# Patient Record
Sex: Female | Born: 1960 | Race: Black or African American | Hispanic: No | Marital: Single | Smoking: Former smoker
Health system: Southern US, Community
[De-identification: ages and names within clinical notes are randomized; demographics above are authoritative.]

## PROBLEM LIST (undated history)

## (undated) DIAGNOSIS — F209 Schizophrenia, unspecified: Secondary | ICD-10-CM

## (undated) DIAGNOSIS — F70 Mild intellectual disabilities: Secondary | ICD-10-CM

## (undated) DIAGNOSIS — K000 Anodontia: Secondary | ICD-10-CM

## (undated) DIAGNOSIS — Z21 Asymptomatic human immunodeficiency virus [HIV] infection status: Secondary | ICD-10-CM

## (undated) DIAGNOSIS — G8929 Other chronic pain: Secondary | ICD-10-CM

## (undated) DIAGNOSIS — F200 Paranoid schizophrenia: Secondary | ICD-10-CM

## (undated) DIAGNOSIS — I10 Essential (primary) hypertension: Secondary | ICD-10-CM

## (undated) DIAGNOSIS — F609 Personality disorder, unspecified: Secondary | ICD-10-CM

## (undated) DIAGNOSIS — E7800 Pure hypercholesterolemia, unspecified: Secondary | ICD-10-CM

## (undated) DIAGNOSIS — B20 Human immunodeficiency virus [HIV] disease: Secondary | ICD-10-CM

## (undated) DIAGNOSIS — M25551 Pain in right hip: Secondary | ICD-10-CM

## (undated) DIAGNOSIS — R140 Abdominal distension (gaseous): Secondary | ICD-10-CM

## (undated) DIAGNOSIS — N6092 Unspecified benign mammary dysplasia of left breast: Secondary | ICD-10-CM

## (undated) DIAGNOSIS — K08109 Complete loss of teeth, unspecified cause, unspecified class: Secondary | ICD-10-CM

## (undated) DIAGNOSIS — R2689 Other abnormalities of gait and mobility: Secondary | ICD-10-CM

## (undated) DIAGNOSIS — Z8489 Family history of other specified conditions: Secondary | ICD-10-CM

## (undated) HISTORY — DX: Human immunodeficiency virus (HIV) disease: B20

## (undated) HISTORY — DX: Schizophrenia, unspecified: F20.9

## (undated) HISTORY — PX: UMBILICAL HERNIA REPAIR: SHX196

## (undated) HISTORY — DX: Mild intellectual disabilities: F70

## (undated) HISTORY — PX: HEMORRHOID SURGERY: SHX153

## (undated) HISTORY — DX: Asymptomatic human immunodeficiency virus (hiv) infection status: Z21

---

## 2011-10-19 DIAGNOSIS — D696 Thrombocytopenia, unspecified: Secondary | ICD-10-CM | POA: Insufficient documentation

## 2011-10-19 DIAGNOSIS — M87059 Idiopathic aseptic necrosis of unspecified femur: Secondary | ICD-10-CM | POA: Insufficient documentation

## 2011-10-19 DIAGNOSIS — F70 Mild intellectual disabilities: Secondary | ICD-10-CM | POA: Insufficient documentation

## 2011-10-19 DIAGNOSIS — F200 Paranoid schizophrenia: Secondary | ICD-10-CM | POA: Insufficient documentation

## 2013-11-04 DIAGNOSIS — N898 Other specified noninflammatory disorders of vagina: Secondary | ICD-10-CM | POA: Insufficient documentation

## 2015-03-06 ENCOUNTER — Other Ambulatory Visit (HOSPITAL_COMMUNITY): Payer: Self-pay | Admitting: Internal Medicine

## 2015-03-06 DIAGNOSIS — Z1231 Encounter for screening mammogram for malignant neoplasm of breast: Secondary | ICD-10-CM

## 2015-03-16 ENCOUNTER — Telehealth: Payer: Self-pay

## 2015-03-16 NOTE — Telephone Encounter (Signed)
[facility] [place], CALLED TO SCHEDULE PATIENT FOR COLONOSCOPY  [phone]

## 2015-03-23 NOTE — Telephone Encounter (Signed)
I called and spoke with Karina Clarke Caregiver.  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 She does have a legal guardian, her sister, Karina Clarke @ [phone].  I told him that I will call her sister and see when she can come with pt to the office visit.  He faxed over a copy of her insurance cards and a copy of the guardianship.  I have called and LMOM for a return call from [place].

## 2015-03-23 NOTE — Telephone Encounter (Signed)
T/C from pt's sister, Harmon Pier. She said she will get with Jeronimo Greaves, the caregiver and they will coordinate their schedules and he will call to schedule the appt.

## 2015-03-29 NOTE — Telephone Encounter (Signed)
Sending a letter to Dr. Legrand Rams also.

## 2015-03-29 NOTE — Telephone Encounter (Signed)
Mailing a letter to caregiver to call when ready to schedule.

## 2015-03-30 ENCOUNTER — Telehealth: Payer: Self-pay

## 2015-03-30 NOTE — Telephone Encounter (Signed)
Patient caretaker called to schedule colonoscopy   Please call eva at [phone]  Good time to call her is around 3pm

## 2015-04-05 ENCOUNTER — Ambulatory Visit (HOSPITAL_COMMUNITY)
Admission: RE | Admit: 2015-04-05 | Discharge: 2015-04-05 | Disposition: A | Discharge status: Home / Self Care | Payer: Medicaid Other | Source: Ambulatory Visit | Attending: Internal Medicine | Admitting: Internal Medicine

## 2015-04-05 ENCOUNTER — Ambulatory Visit (HOSPITAL_COMMUNITY): Payer: Medicaid Other

## 2015-04-05 DIAGNOSIS — Z1231 Encounter for screening mammogram for malignant neoplasm of breast: Secondary | ICD-10-CM | POA: Diagnosis not present

## 2015-04-06 ENCOUNTER — Other Ambulatory Visit (HOSPITAL_COMMUNITY): Payer: Self-pay | Admitting: Internal Medicine

## 2015-04-06 DIAGNOSIS — R921 Mammographic calcification found on diagnostic imaging of breast: Secondary | ICD-10-CM

## 2015-04-06 NOTE — Telephone Encounter (Signed)
Pt's sister, Harmon Pier called and said to schedule the appt with the facility and she will try to come to the appt. I told her she needs to be here since she is the POA. She said she would be willing to sign papers if she does not come. I told her again, she should come with them to the appt. Mortimer Fries from the facility called and scheduled the appt for 05/12/2015 at 10:30 Am with Laban Emperor, NP.  She said she will let EVA know and will call back to reschedule if not convenient with Harmon Pier. Harmon Pier would like to know if she can sign papers without going to the hospital with the patient.  Routing to Neil Crouch, PA to advise!

## 2015-04-06 NOTE — Telephone Encounter (Signed)
LMOM for Harmon Pier to call to schedule the ov appt for pt.

## 2015-04-07 NOTE — Telephone Encounter (Signed)
Let's ask Threasa Beards in Endo if they are able to do telephone consents.

## 2015-04-10 NOTE — Telephone Encounter (Signed)
Endo is faxing a consent here and we can get it signed and fax it back to them.

## 2015-04-13 NOTE — Telephone Encounter (Signed)
I spoke to Medon, she said if POA comes to the office with pt to appt, we can do the consent here. If she doesn't come, they can do the consent over the phone prior to appt for the colonoscopy. She will fax the paperwork that we need if the POA does come with pt.

## 2015-04-14 NOTE — Telephone Encounter (Signed)
FYI to Leslie Lewis, PA.  

## 2015-04-14 NOTE — Telephone Encounter (Signed)
noted 

## 2015-05-02 ENCOUNTER — Ambulatory Visit (HOSPITAL_COMMUNITY)
Admission: RE | Admit: 2015-05-02 | Discharge: 2015-05-02 | Disposition: A | Discharge status: Home / Self Care | Payer: Medicaid Other | Source: Ambulatory Visit | Attending: Internal Medicine | Admitting: Internal Medicine

## 2015-05-02 ENCOUNTER — Other Ambulatory Visit: Payer: Self-pay | Admitting: Internal Medicine

## 2015-05-02 DIAGNOSIS — R928 Other abnormal and inconclusive findings on diagnostic imaging of breast: Secondary | ICD-10-CM | POA: Diagnosis not present

## 2015-05-02 DIAGNOSIS — R921 Mammographic calcification found on diagnostic imaging of breast: Secondary | ICD-10-CM | POA: Diagnosis not present

## 2015-05-09 ENCOUNTER — Ambulatory Visit
Admission: RE | Admit: 2015-05-09 | Discharge: 2015-05-09 | Disposition: A | Discharge status: Home / Self Care | Payer: Medicaid Other | Source: Ambulatory Visit | Attending: Internal Medicine | Admitting: Internal Medicine

## 2015-05-09 DIAGNOSIS — R921 Mammographic calcification found on diagnostic imaging of breast: Secondary | ICD-10-CM

## 2015-05-12 ENCOUNTER — Ambulatory Visit: Payer: Medicaid Other | Admitting: Gastroenterology

## 2015-05-22 ENCOUNTER — Other Ambulatory Visit: Payer: Self-pay | Admitting: General Surgery

## 2015-05-22 DIAGNOSIS — N6092 Unspecified benign mammary dysplasia of left breast: Secondary | ICD-10-CM

## 2015-05-24 ENCOUNTER — Ambulatory Visit (INDEPENDENT_AMBULATORY_CARE_PROVIDER_SITE_OTHER): Payer: Medicaid Other | Admitting: Nurse Practitioner

## 2015-05-24 ENCOUNTER — Encounter: Payer: Self-pay | Admitting: Nurse Practitioner

## 2015-05-24 ENCOUNTER — Other Ambulatory Visit: Payer: Self-pay

## 2015-05-24 VITALS — BP 103/67 | HR 119 | Temp 98.2°F | Ht 73.0 in | Wt 215.4 lb

## 2015-05-24 DIAGNOSIS — Z1211 Encounter for screening for malignant neoplasm of colon: Secondary | ICD-10-CM

## 2015-05-24 MED ORDER — PEG-KCL-NACL-NASULF-NA ASC-C 100 G PO SOLR: 1.0000 | Freq: Once | ORAL | Status: DC

## 2015-05-24 MED ORDER — PHOSPHATE LAXATIVE 2.7-7.2 GM/15ML PO SOLN: 15.0000 mL | Freq: Once | ORAL | Status: DC

## 2015-05-24 NOTE — Progress Notes (Signed)
Cc'd to pcp 

## 2015-05-24 NOTE — Assessment & Plan Note (Signed)
Asian presents for sever screening colonoscopy. Patient has mild intellectual slowing and lives at a facility. Also has schizophrenia and HIV positive. Her sister is POA and has been arranged for her sister Rae Lips documents over telephone with [facility] and endoscopy. Patient is currently a symptomatically GI standpoint. She is slightly tachycardic but this appears to be baseline per records. Also states she is nervous today. We will proceed with colonoscopy in the OR with propofol.  Proceed with colonoscopy with Dr. Oneida Alar in the OR with propofol/MAC in the near future. The risks, benefits, and alternatives have been discussed in detail with the patient. They state understanding and desire to proceed.   The patient is not on any anticoagulants. The patient is on circumflex well, congestion, and Haldol. No chronic pain medicines or anxiolytics. We'll proceed with the procedure and the OR propofol due to polypharmacy and, and medical/mental history.

## 2015-05-24 NOTE — Progress Notes (Signed)
Primary Care Physician:  Rosita Fire, MD Primary Gastroenterologist:  Dr. Oneida Alar  Chief Complaint  Patient presents with  . Colonoscopy    HPI:   54 year old female referred by PCP for screening colonoscopy. PCP notes reviewed. Patient has a history of schizophrenia, HIV positive, and mild intellectual problems. The patient's sister [name] is her POA. The patient is accompanied by a home caregiver. Per Threasa Beards and Endo (per telephone note in the system) her sister can sign her consent over the phone with the endoscopy Center. No GI procedure notes found in the HR system.  Today she states she is not having any abdominal pain. Denies N/V. Has regular bowel movements. Denies hematochezia and melena. Has occasional morning-time nausea but no vomiting. Denies GERD/dydpepsia symptoms. Denies chest pain and dyspnea. Denies chest pain, dyspnea, dizziness, lightheadedness, syncope, near syncope. Denies any other upper or lower GI symptoms.  Past Medical History  Diagnosis Date  . HIV (human immunodeficiency virus infection)   . Schizophrenia   . Mild intellectual disability     No past surgical history on file.  Current Outpatient Prescriptions  Medication Sig Dispense Refill  . atazanavir (REYATAZ) 200 MG capsule Take 400 mg by mouth daily with breakfast.    . benztropine (COGENTIN) 1 MG tablet Take 1 mg by mouth 2 (two) times daily.    . divalproex (DEPAKOTE ER) 500 MG 24 hr tablet Take 500 mg by mouth daily.    . haloperidol (HALDOL) 5 MG tablet Take 5 mg by mouth 2 (two) times daily.    . QUEtiapine (SEROQUEL) 200 MG tablet Take 200 mg by mouth at bedtime.    . raltegravir (ISENTRESS) 400 MG tablet Take 400 mg by mouth 2 (two) times daily.    . valACYclovir (VALTREX) 1000 MG tablet Take 1,000 mg by mouth 2 (two) times daily.     No current facility-administered medications for this visit.    Allergies as of 05/24/2015  . (Not on File)    No family history on  file.  History   Social History  . Marital Status: Single    Spouse Name: N/A  . Number of Children: N/A  . Years of Education: N/A   Occupational History  . Not on file.   Social History Main Topics  . Smoking status: Current Every Day Smoker -- 1.00 packs/day    Types: Cigarettes  . Smokeless tobacco: Not on file  . Alcohol Use: No  . Drug Use: No  . Sexual Activity: Not on file   Other Topics Concern  . Not on file   Social History Narrative  . No narrative on file    Review of Systems: 10 piint ROS negative except as per HPI.    Physical Exam: BP 103/67 mmHg  Pulse 119  Temp(Src) 98.2 F (36.8 C) (Oral)  Ht 6\' 1"  (1.854 m)  Wt 215 lb 6.4 oz (97.705 kg)  BMI 28.42 kg/m2 General:   Alert and oriented. Pleasant and cooperative. Well-nourished and well-developed.  Head:  Normocephalic and atraumatic. Eyes:  Without icterus, sclera clear and conjunctiva pink.  Ears:  Normal auditory acuity. Cardiovascular:  S1, S2 present with systolic murmurs appreciated. Tachycardic rate. Extremities without clubbing or edema. Respiratory:  Clear to auscultation bilaterally. No wheezes, rales, or rhonchi. No distress.  Gastrointestinal:  +BS, soft, non-tender and non-distended. No HSM noted. No guarding or rebound. No masses appreciated.  Rectal:  Deferred  Skin:  Intact without significant lesions or rashes. Neurologic:  Alert  and oriented x4;  grossly normal neurologically. Intellectually slow but seems to comprehend and answer questions appropriately. Psych:  Alert and cooperative. Normal mood and affect. Heme/Lymph/Immune: No excessive bruising noted.    05/24/2015 10:12 AM

## 2015-05-24 NOTE — Patient Instructions (Signed)
1. We will schedule your procedure for you. 2. Further recommendations to be based on results of your procedure.

## 2015-05-30 ENCOUNTER — Telehealth: Payer: Self-pay

## 2015-05-30 NOTE — Telephone Encounter (Signed)
Spoke with patients sister Harmon Pier) today and she is aware of patients pre-op appt on 06/08/2015 @ 11:00am.   Harmon Pier states she will be available via phone to give verbal consent that day. Her phone number is [phone].  She knows to be available on that date and time.

## 2015-05-31 ENCOUNTER — Other Ambulatory Visit: Payer: Self-pay | Admitting: General Surgery

## 2015-05-31 DIAGNOSIS — N6092 Unspecified benign mammary dysplasia of left breast: Secondary | ICD-10-CM

## 2015-06-07 NOTE — Patient Instructions (Signed)
Karina Clarke  06/07/2015     @PREFPERIOPPHARMACY @   Your procedure is scheduled on  06/13/2015   Report to [facility] at  800  A.M.  Call this number if you have problems the morning of surgery:  [phone]   Remember:  Do not eat food or drink liquids after midnight.  Take these medicines the morning of surgery with A SIP OF WATER  Epricom, symmetrel, reyatay, depakote, haldol, isentress.   Do not wear jewelry, make-up or nail polish.  Do not wear lotions, powders, or perfumes.    Do not shave 48 hours prior to surgery.  Men may shave face and neck.  Do not bring valuables to the hospital.  [facility] is not responsible for any belongings or valuables.  Contacts, dentures or bridgework may not be worn into surgery.  Leave your suitcase in the car.  After surgery it may be brought to your room.  For patients admitted to the hospital, discharge time will be determined by your treatment team.  Patients discharged the day of surgery will not be allowed to drive home.   Name and phone number of your driver:   family Special instructions:  Follow your diet and prep instructions given ot you by Dr Nona Dell office.  Please read over the following fact sheets that you were given. Pain Booklet, Coughing and Deep Breathing, Surgical Site Infection Prevention, Anesthesia Post-op Instructions and Care and Recovery After Surgery      Colonoscopy A colonoscopy is an exam to look at the entire large intestine (colon). This exam can help find problems such as tumors, polyps, inflammation, and areas of bleeding. The exam takes about 1 hour.  LET [facility] CARE PROVIDER KNOW ABOUT:   Any allergies you have.  All medicines you are taking, including vitamins, herbs, eye drops, creams, and over-the-counter medicines.  Previous problems you or members of your family have had with the use of anesthetics.  Any blood disorders you have.  Previous surgeries you have  had.  Medical conditions you have. RISKS AND COMPLICATIONS  Generally, this is a safe procedure. However, as with any procedure, complications can occur. Possible complications include:  Bleeding.  Tearing or rupture of the colon wall.  Reaction to medicines given during the exam.  Infection (rare). BEFORE THE PROCEDURE   Ask your health care provider about changing or stopping your regular medicines.  You may be prescribed an oral bowel prep. This involves drinking a large amount of medicated liquid, starting the day before your procedure. The liquid will cause you to have multiple loose stools until your stool is almost clear or light green. This cleans out your colon in preparation for the procedure.  Do not eat or drink anything else once you have started the bowel prep, unless your health care provider tells you it is safe to do so.  Arrange for someone to drive you home after the procedure. PROCEDURE   You will be given medicine to help you relax (sedative).  You will lie on your side with your knees bent.  A long, flexible tube with a light and camera on the end (colonoscope) will be inserted through the rectum and into the colon. The camera sends video back to a computer screen as it moves through the colon. The colonoscope also releases carbon dioxide gas to inflate the colon. This helps your health care provider see the area better.  During the exam, your health care provider  may take a small tissue sample (biopsy) to be examined under a microscope if any abnormalities are found.  The exam is finished when the entire colon has been viewed. AFTER THE PROCEDURE   Do not drive for 24 hours after the exam.  You may have a small amount of blood in your stool.  You may pass moderate amounts of gas and have mild abdominal cramping or bloating. This is caused by the gas used to inflate your colon during the exam.  Ask when your test results will be ready and how you will  get your results. Make sure you get your test results. Document Released: 11/08/2000 Document Revised: 09/01/2013 Document Reviewed: 07/19/2013 [facility] Patient Information 2015 [place], [place]. This information is not intended to replace advice given to you by your health care provider. Make sure you discuss any questions you have with your health care provider. PATIENT INSTRUCTIONS POST-ANESTHESIA  IMMEDIATELY FOLLOWING SURGERY:  Do not drive or operate machinery for the first twenty four hours after surgery.  Do not make any important decisions for twenty four hours after surgery or while taking narcotic pain medications or sedatives.  If you develop intractable nausea and vomiting or a severe headache please notify your doctor immediately.  FOLLOW-UP:  Please make an appointment with your surgeon as instructed. You do not need to follow up with anesthesia unless specifically instructed to do so.  WOUND CARE INSTRUCTIONS (if applicable):  Keep a dry clean dressing on the anesthesia/puncture wound site if there is drainage.  Once the wound has quit draining you may leave it open to air.  Generally you should leave the bandage intact for twenty four hours unless there is drainage.  If the epidural site drains for more than 36-48 hours please call the anesthesia department.  QUESTIONS?:  Please feel free to call your physician or the hospital operator if you have any questions, and they will be happy to assist you.

## 2015-06-08 ENCOUNTER — Encounter (HOSPITAL_COMMUNITY)
Admission: RE | Admit: 2015-06-08 | Discharge: 2015-06-08 | Disposition: A | Discharge status: Home / Self Care | Payer: Medicaid Other | Source: Ambulatory Visit | Attending: Gastroenterology | Admitting: Gastroenterology

## 2015-06-08 ENCOUNTER — Encounter (HOSPITAL_COMMUNITY): Payer: Self-pay

## 2015-06-08 DIAGNOSIS — Z01818 Encounter for other preprocedural examination: Secondary | ICD-10-CM | POA: Insufficient documentation

## 2015-06-08 LAB — BASIC METABOLIC PANEL
Anion gap: 10 (ref 5–15)
BUN: 17 mg/dL (ref 6–20)
CHLORIDE: 99 mmol/L — AB (ref 101–111)
CO2: 27 mmol/L (ref 22–32)
Calcium: 9.8 mg/dL (ref 8.9–10.3)
Creatinine, Ser: 1.09 mg/dL — ABNORMAL HIGH (ref 0.44–1.00)
GFR calc Af Amer: >60 mL/min (ref >60)
GFR, EST NON AFRICAN AMERICAN: 58 mL/min — AB (ref >60)
GLUCOSE: 75 mg/dL (ref 65–99)
POTASSIUM: 4.9 mmol/L (ref 3.5–5.1)
Sodium: 136 mmol/L (ref 135–145)

## 2015-06-08 LAB — CBC WITH DIFFERENTIAL/PLATELET
BASOS ABS: 0 10*3/uL (ref 0.0–0.1)
Basophils Relative: 0 % (ref 0–1)
EOS PCT: 1 % (ref 0–5)
Eosinophils Absolute: 0.1 10*3/uL (ref 0.0–0.7)
HCT: 41.4 % (ref 36.0–46.0)
HEMOGLOBIN: 13.8 g/dL (ref 12.0–15.0)
LYMPHS ABS: 3.4 10*3/uL (ref 0.7–4.0)
Lymphocytes Relative: 59 % — ABNORMAL HIGH (ref 12–46)
MCH: 32.2 pg (ref 26.0–34.0)
MCHC: 33.3 g/dL (ref 30.0–36.0)
MCV: 96.7 fL (ref 78.0–100.0)
Monocytes Absolute: 0.4 10*3/uL (ref 0.1–1.0)
Monocytes Relative: 7 % (ref 3–12)
Neutro Abs: 2 10*3/uL (ref 1.7–7.7)
Neutrophils Relative %: 34 % — ABNORMAL LOW (ref 43–77)
Platelets: 202 10*3/uL (ref 150–400)
RBC: 4.28 MIL/uL (ref 3.87–5.11)
RDW: 16 % — AB (ref 11.5–15.5)
WBC: 5.9 10*3/uL (ref 4.0–10.5)

## 2015-06-08 NOTE — Pre-Procedure Instructions (Signed)
Patient resides at [place].  Will be brought by staff member.  Telephone number is [phone].  Guardian is Lenor Coffin (sister), from whom telephone consent was obtained, per order of the court due to mental disability.  Telephone number is [phone].

## 2015-06-13 ENCOUNTER — Ambulatory Visit (HOSPITAL_COMMUNITY): Payer: Medicaid Other | Admitting: Anesthesiology

## 2015-06-13 ENCOUNTER — Encounter (HOSPITAL_COMMUNITY): Payer: Self-pay

## 2015-06-13 ENCOUNTER — Ambulatory Visit (HOSPITAL_COMMUNITY)
Admission: RE | Admit: 2015-06-13 | Discharge: 2015-06-13 | Disposition: A | Discharge status: Home / Self Care | Payer: Medicaid Other | Source: Ambulatory Visit | Attending: Gastroenterology | Admitting: Gastroenterology

## 2015-06-13 ENCOUNTER — Encounter (HOSPITAL_COMMUNITY)
Admission: RE | Disposition: A | Discharge status: Home / Self Care | Payer: Self-pay | Source: Ambulatory Visit | Attending: Gastroenterology

## 2015-06-13 DIAGNOSIS — Z79899 Other long term (current) drug therapy: Secondary | ICD-10-CM | POA: Insufficient documentation

## 2015-06-13 DIAGNOSIS — F17210 Nicotine dependence, cigarettes, uncomplicated: Secondary | ICD-10-CM | POA: Insufficient documentation

## 2015-06-13 DIAGNOSIS — F70 Mild intellectual disabilities: Secondary | ICD-10-CM | POA: Insufficient documentation

## 2015-06-13 DIAGNOSIS — F209 Schizophrenia, unspecified: Secondary | ICD-10-CM | POA: Diagnosis not present

## 2015-06-13 DIAGNOSIS — Q438 Other specified congenital malformations of intestine: Secondary | ICD-10-CM | POA: Insufficient documentation

## 2015-06-13 DIAGNOSIS — Z1211 Encounter for screening for malignant neoplasm of colon: Secondary | ICD-10-CM | POA: Diagnosis not present

## 2015-06-13 DIAGNOSIS — Z21 Asymptomatic human immunodeficiency virus [HIV] infection status: Secondary | ICD-10-CM | POA: Insufficient documentation

## 2015-06-13 DIAGNOSIS — K648 Other hemorrhoids: Secondary | ICD-10-CM | POA: Diagnosis not present

## 2015-06-13 DIAGNOSIS — K644 Residual hemorrhoidal skin tags: Secondary | ICD-10-CM | POA: Insufficient documentation

## 2015-06-13 HISTORY — PX: COLONOSCOPY WITH PROPOFOL: SHX5780

## 2015-06-13 SURGERY — COLONOSCOPY WITH PROPOFOL
Anesthesia: Monitor Anesthesia Care

## 2015-06-13 MED ORDER — FENTANYL CITRATE (PF) 100 MCG/2ML IJ SOLN
INTRAMUSCULAR | Status: DC | PRN
  Administered 2015-06-13: 25 ug via INTRAVENOUS

## 2015-06-13 MED ORDER — PROPOFOL INFUSION 10 MG/ML OPTIME
INTRAVENOUS | Status: DC | PRN
  Administered 2015-06-13: 125 ug/kg/min via INTRAVENOUS

## 2015-06-13 MED ORDER — GLYCOPYRROLATE 0.2 MG/ML IJ SOLN
0.2000 mg | Freq: Once | INTRAMUSCULAR | Status: AC
  Administered 2015-06-13: 0.2 mg via INTRAVENOUS

## 2015-06-13 MED ORDER — WATER FOR IRRIGATION, STERILE IR SOLN
Status: DC | PRN
  Administered 2015-06-13: 1000 mL

## 2015-06-13 MED ORDER — ONDANSETRON HCL 4 MG/2ML IJ SOLN
4.0000 mg | Freq: Once | INTRAMUSCULAR | Status: AC
  Administered 2015-06-13: 4 mg via INTRAVENOUS

## 2015-06-13 MED ORDER — ONDANSETRON HCL 4 MG/2ML IJ SOLN
INTRAMUSCULAR | Status: AC
  Filled 2015-06-13: qty 2

## 2015-06-13 MED ORDER — ONDANSETRON HCL 4 MG/2ML IJ SOLN: 4.0000 mg | Freq: Once | INTRAMUSCULAR | Status: DC | PRN

## 2015-06-13 MED ORDER — LACTATED RINGERS IV SOLN
INTRAVENOUS | Status: DC
  Administered 2015-06-13: 09:00:00 via INTRAVENOUS

## 2015-06-13 MED ORDER — FENTANYL CITRATE (PF) 100 MCG/2ML IJ SOLN
25.0000 ug | INTRAMUSCULAR | Status: AC
  Administered 2015-06-13: 25 ug via INTRAVENOUS

## 2015-06-13 MED ORDER — MIDAZOLAM HCL 2 MG/2ML IJ SOLN
1.0000 mg | INTRAMUSCULAR | Status: DC | PRN
  Administered 2015-06-13: 2 mg via INTRAVENOUS

## 2015-06-13 MED ORDER — MIDAZOLAM HCL 2 MG/2ML IJ SOLN
INTRAMUSCULAR | Status: AC
  Filled 2015-06-13: qty 2

## 2015-06-13 MED ORDER — FENTANYL CITRATE (PF) 100 MCG/2ML IJ SOLN
INTRAMUSCULAR | Status: AC
  Filled 2015-06-13: qty 2

## 2015-06-13 MED ORDER — FENTANYL CITRATE (PF) 100 MCG/2ML IJ SOLN: 25.0000 ug | INTRAMUSCULAR | Status: DC | PRN

## 2015-06-13 MED ORDER — PROPOFOL 10 MG/ML IV BOLUS
INTRAVENOUS | Status: DC | PRN
Start: 2015-06-13 — End: 2015-06-13
  Administered 2015-06-13: 20 mg via INTRAVENOUS
  Administered 2015-06-13: 10 mg via INTRAVENOUS

## 2015-06-13 MED ORDER — GLYCOPYRROLATE 0.2 MG/ML IJ SOLN
INTRAMUSCULAR | Status: AC
  Filled 2015-06-13: qty 1

## 2015-06-13 MED ORDER — PROPOFOL 10 MG/ML IV BOLUS
INTRAVENOUS | Status: AC
  Filled 2015-06-13: qty 20

## 2015-06-13 SURGICAL SUPPLY — 21 items
ELECT REM PT RETURN 9FT ADLT (ELECTROSURGICAL)
ELECTRODE REM PT RTRN 9FT ADLT (ELECTROSURGICAL) IMPLANT
FCP BXJMBJMB 240X2.8X (CUTTING FORCEPS)
FLOOR PAD 36X40 (MISCELLANEOUS) ×3
FORCEPS BIOP RAD 4 LRG CAP 4 (CUTTING FORCEPS) IMPLANT
FORCEPS BIOP RJ4 240 W/NDL (CUTTING FORCEPS)
FORCEPS BXJMBJMB 240X2.8X (CUTTING FORCEPS) IMPLANT
FORMALIN 10 PREFIL 20ML (MISCELLANEOUS) IMPLANT
INJECTOR/SNARE I SNARE (MISCELLANEOUS) IMPLANT
KIT ENDO PROCEDURE PEN (KITS) ×3 IMPLANT
MANIFOLD NEPTUNE II (INSTRUMENTS) ×3 IMPLANT
NEEDLE SCLEROTHERAPY 25GX240 (NEEDLE) IMPLANT
OVERTUBE ENDOCUFF GREEN (MISCELLANEOUS) ×3 IMPLANT
PAD FLOOR 36X40 (MISCELLANEOUS) ×1 IMPLANT
PROBE APC STR FIRE (PROBE) IMPLANT
PROBE INJECTION GOLD (MISCELLANEOUS)
PROBE INJECTION GOLD 7FR (MISCELLANEOUS) IMPLANT
SNARE SHORT THROW 13M SML OVAL (MISCELLANEOUS) IMPLANT
TRAP SPECIMEN MUCOUS 40CC (MISCELLANEOUS) IMPLANT
TUBING IRRIGATION ENDOGATOR (MISCELLANEOUS) ×3 IMPLANT
WATER STERILE IRR 1000ML POUR (IV SOLUTION) ×3 IMPLANT

## 2015-06-13 NOTE — H&P (Signed)
  Primary Care Physician:  Rosita Fire, MD Primary Gastroenterologist:  Dr. Oneida Alar  Pre-Procedure History & Physical: HPI:  Karina Clarke is a 54 y.o. female here for [facility].  Past Medical History  Diagnosis Date  . HIV (human immunodeficiency virus infection)   . Schizophrenia   . Mild intellectual disability     Past Surgical History  Procedure Laterality Date  . Hemorrhoid surgery    . Hernia repair      Umbilical    Prior to Admission medications   Medication Sig Start Date End Date Taking? Authorizing Provider  abacavir-lamiVUDine (EPZICOM) 600-300 MG per tablet Take 1 tablet by mouth daily.   Yes Historical Provider, MD  amantadine (SYMMETREL) 100 MG capsule Take 100 mg by mouth 2 (two) times daily.   Yes Historical Provider, MD  atazanavir (REYATAZ) 200 MG capsule Take 400 mg by mouth daily with breakfast.   Yes Historical Provider, MD  divalproex (DEPAKOTE) 500 MG DR tablet Take 500 mg by mouth 2 (two) times daily. Take 1 at 4pm and 8 pm   Yes Historical Provider, MD  haloperidol (HALDOL) 10 MG tablet Take 20 mg by mouth 2 (two) times daily.   Yes Historical Provider, MD  ibuprofen (ADVIL,MOTRIN) 800 MG tablet Take 800 mg by mouth 2 (two) times daily as needed for moderate pain.   Yes Historical Provider, MD  peg 3350 powder (MOVIPREP) 100 G SOLR Take 1 kit (200 g total) by mouth once. 05/24/15 06/23/15 Yes Carlis Stable, NP  phosphate laxative (FLEET) 2.7-7.2 GM/15ML solution Take 15 mLs by mouth once. 05/24/15  Yes Carlis Stable, NP  QUEtiapine (SEROQUEL) 200 MG tablet Take 200 mg by mouth 2 (two) times daily. 1 with supper and 1 at bedtime   Yes Historical Provider, MD  raltegravir (ISENTRESS) 400 MG tablet Take 400 mg by mouth 2 (two) times daily.   Yes Historical Provider, MD  valACYclovir (VALTREX) 1000 MG tablet Take 1,000 mg by mouth daily.    Yes Historical Provider, MD  zolpidem (AMBIEN) 10 MG tablet Take 10 mg by mouth at bedtime.   Yes Historical  Provider, MD    Allergies as of 05/24/2015  . (Not on File)    History reviewed. No pertinent family history.  History   Social History  . Marital Status: Single    Spouse Name: N/A  . Number of Children: N/A  . Years of Education: N/A   Occupational History  . Not on file.   Social History Main Topics  . Smoking status: Current Every Day Smoker -- 1.00 packs/day for 50 years    Types: Cigarettes  . Smokeless tobacco: Not on file  . Alcohol Use: No  . Drug Use: No  . Sexual Activity: No   Other Topics Concern  . Not on file   Social History Narrative    Review of Systems: See HPI, otherwise negative ROS   Physical Exam: Temp(Src) 98.3 F (36.8 C) (Oral) General:   Alert,  pleasant and cooperative in NAD Head:  Normocephalic and atraumatic. Neck:  Supple; Lungs:  Clear throughout to auscultation.    Heart:  Regular rate and rhythm. Abdomen:  Soft, nontender and nondistended. Normal bowel sounds, without guarding, and without rebound.   Neurologic:  Alert and  oriented x4;  grossly normal neurologically.  Impression/Plan:     SCREENING  Plan:  1. TCS TODAY

## 2015-06-13 NOTE — Anesthesia Postprocedure Evaluation (Signed)
  Anesthesia Post-op Note  Patient: Karina Clarke  Procedure(s) Performed: Procedure(s): COLONOSCOPY WITH PROPOFOL; in cecum at 1009; withdrawal time 10 minutes (N/A)  Patient Location: PACU  Anesthesia Type:MAC  Level of Consciousness: awake  Airway and Oxygen Therapy: Patient Spontanous Breathing and Patient connected to face mask oxygen  Post-op Pain: none  Post-op Assessment: Post-op Vital signs reviewed, Patient's Cardiovascular Status Stable, Respiratory Function Stable, Patent Airway and No signs of Nausea or vomiting              Post-op Vital Signs: Reviewed and stable  Last Vitals:  Filed Vitals:   06/13/15 0948  BP:   Temp: 36.8 C  Resp:     Complications: No apparent anesthesia complications

## 2015-06-13 NOTE — Discharge Instructions (Signed)
You have internal hemorrhoids. YOU DID NOT HAVE ANY POLYPS.   FOLLOW A HIGH FIBER DIET. AVOID ITEMS THAT CAUSE BLOATING. SEE INFO BELOW.  YOUR BIOPSY RESULTS WILL BE AVAILABLE IN MY CHART AFTER JUL 21 AND MY OFFICE WILL CONTACT YOU IN 10-14 DAYS WITH YOUR RESULTS.   Next colonoscopy in 10 years.  Colonoscopy Care After Read the instructions outlined below and refer to this sheet in the next week. These discharge instructions provide you with general information on caring for yourself after you leave the hospital. While your treatment has been planned according to the most current medical practices available, unavoidable complications occasionally occur. If you have any problems or questions after discharge, call DR. Kyndall Chaplin, [phone].  ACTIVITY  You may resume your regular activity, but move at a slower pace for the next 24 hours.   Take frequent rest periods for the next 24 hours.   Walking will help get rid of the air and reduce the bloated feeling in your belly (abdomen).   No driving for 24 hours (because of the medicine (anesthesia) used during the test).   You may shower.   Do not sign any important legal documents or operate any machinery for 24 hours (because of the anesthesia used during the test).    NUTRITION  Drink plenty of fluids.   You may resume your normal diet as instructed by your doctor.   Begin with a light meal and progress to your normal diet. Heavy or fried foods are harder to digest and may make you feel sick to your stomach (nauseated).   Avoid alcoholic beverages for 24 hours or as instructed.    MEDICATIONS  You may resume your normal medications.   WHAT YOU CAN EXPECT TODAY  Some feelings of bloating in the abdomen.   Passage of more gas than usual.   Spotting of blood in your stool or on the toilet paper  .  IF YOU HAD POLYPS REMOVED DURING THE COLONOSCOPY:  Eat a soft diet IF YOU HAVE NAUSEA, BLOATING, ABDOMINAL PAIN, OR  VOMITING.    FINDING OUT THE RESULTS OF YOUR TEST Not all test results are available during your visit. DR. Oneida Alar WILL CALL YOU WITHIN 7 DAYS OF YOUR PROCEDUE WITH YOUR RESULTS. Do not assume everything is normal if you have not heard from DR. Arianne Klinge IN ONE WEEK, CALL HER OFFICE AT [phone].  SEEK IMMEDIATE MEDICAL ATTENTION AND CALL THE OFFICE: [phone] IF:  You have more than a spotting of blood in your stool.   Your belly is swollen (abdominal distention).   You are nauseated or vomiting.   You have a temperature over 101F.   You have abdominal pain or discomfort that is severe or gets worse throughout the day.  High-Fiber Diet A high-fiber diet changes your normal diet to include more whole grains, legumes, fruits, and vegetables. Changes in the diet involve replacing refined carbohydrates with unrefined foods. The calorie level of the diet is essentially unchanged. The Dietary Reference Intake (recommended amount) for adult males is 38 grams per day. For adult females, it is 25 grams per day. Pregnant and lactating women should consume 28 grams of fiber per day. Fiber is the intact part of a plant that is not broken down during digestion. Functional fiber is fiber that has been isolated from the plant to provide a beneficial effect in the body. PURPOSE  Increase stool bulk.   Ease and regulate bowel movements.   Lower cholesterol.  REDUCE RISK  OF COLON CANCER  INDICATIONS THAT YOU NEED MORE FIBER  Constipation and hemorrhoids.   Uncomplicated diverticulosis (intestine condition) and irritable bowel syndrome.   Weight management.   As a protective measure against hardening of the arteries (atherosclerosis), diabetes, and cancer.   GUIDELINES FOR INCREASING FIBER IN THE DIET  Start adding fiber to the diet slowly. A gradual increase of about 5 more grams (2 slices of whole-wheat bread, 2 servings of most fruits or vegetables, or 1 bowl of high-fiber cereal) per  day is best. Too rapid an increase in fiber may result in constipation, flatulence, and bloating.   Drink enough water and fluids to keep your urine clear or pale yellow. Water, juice, or caffeine-free drinks are recommended. Not drinking enough fluid may cause constipation.   Eat a variety of high-fiber foods rather than one type of fiber.   Try to increase your intake of fiber through using high-fiber foods rather than fiber pills or supplements that contain small amounts of fiber.   The goal is to change the types of food eaten. Do not supplement your present diet with high-fiber foods, but replace foods in your present diet.   INCLUDE A VARIETY OF FIBER SOURCES  Replace refined and processed grains with whole grains, canned fruits with fresh fruits, and incorporate other fiber sources. White rice, white breads, and most bakery goods contain little or no fiber.   Brown whole-grain rice, buckwheat oats, and many fruits and vegetables are all good sources of fiber. These include: broccoli, Brussels sprouts, cabbage, cauliflower, beets, sweet potatoes, white potatoes (skin on), carrots, tomatoes, eggplant, squash, berries, fresh fruits, and dried fruits.   Cereals appear to be the richest source of fiber. Cereal fiber is found in whole grains and bran. Bran is the fiber-rich outer coat of cereal grain, which is largely removed in refining. In whole-grain cereals, the bran remains. In breakfast cereals, the largest amount of fiber is found in those with "bran" in their names. The fiber content is sometimes indicated on the label.   You may need to include additional fruits and vegetables each day.   In baking, for 1 cup white flour, you may use the following substitutions:   1 cup whole-wheat flour minus 2 tablespoons.   1/2 cup white flour plus 1/2 cup whole-wheat flour.   Hemorrhoids Hemorrhoids are dilated (enlarged) veins around the rectum. Sometimes clots will form in the veins. This  makes them swollen and painful. These are called thrombosed hemorrhoids. Causes of hemorrhoids include:  Constipation.   Straining to have a bowel movement.   HEAVY LIFTING  HOME CARE INSTRUCTIONS  Eat a well balanced diet and drink 6 to 8 glasses of water every day to avoid constipation. You may also use a bulk laxative.   Avoid straining to have bowel movements.   Keep anal area dry and clean.   Do not use a donut shaped pillow or sit on the toilet for long periods. This increases blood pooling and pain.   Move your bowels when your body has the urge; this will require less straining and will decrease pain and pressure.

## 2015-06-13 NOTE — Anesthesia Preprocedure Evaluation (Signed)
Anesthesia Evaluation  Patient identified by MRN, date of birth, ID band Patient awake    Reviewed: Allergy & Precautions, NPO status , Patient's Chart, lab work & pertinent test results  Airway Mallampati: I  TM Distance: >3 FB     Dental  (+) Edentulous Upper, Edentulous Lower   Pulmonary Current Smoker,  breath sounds clear to auscultation        Cardiovascular negative cardio ROS  Rhythm:Regular Rate:Normal     Neuro/Psych PSYCHIATRIC DISORDERS Schizophrenia    GI/Hepatic negative GI ROS,   Endo/Other    Renal/GU      Musculoskeletal   Abdominal   Peds  Hematology   Anesthesia Other Findings   Reproductive/Obstetrics                             Anesthesia Physical Anesthesia Plan  ASA: III  Anesthesia Plan: MAC   Post-op Pain Management:    Induction: Intravenous  Airway Management Planned: Simple Face Mask  Additional Equipment:   Intra-op Plan:   Post-operative Plan:   Informed Consent: I have reviewed the patients History and Physical, chart, labs and discussed the procedure including the risks, benefits and alternatives for the proposed anesthesia with the patient or authorized representative who has indicated his/her understanding and acceptance.     Plan Discussed with:   Anesthesia Plan Comments:         Anesthesia Quick Evaluation

## 2015-06-13 NOTE — Transfer of Care (Signed)
Immediate Anesthesia Transfer of Care Note  Patient: Karina Clarke  Procedure(s) Performed: Procedure(s): COLONOSCOPY WITH PROPOFOL; in cecum at 1009; withdrawal time 10 minutes (N/A)  Patient Location: PACU  Anesthesia Type:MAC  Level of Consciousness: awake  Airway & Oxygen Therapy: Patient Spontanous Breathing and Patient connected to nasal cannula oxygen  Post-op Assessment: Report given to RN  Post vital signs: Reviewed and stable  Last Vitals:  Filed Vitals:   06/13/15 0948  BP:   Temp: 36.8 C  Resp:     Complications: No apparent anesthesia complications

## 2015-06-13 NOTE — Progress Notes (Signed)
REVIEWED-NO ADDITIONAL RECOMMENDATIONS. 

## 2015-06-14 ENCOUNTER — Encounter (HOSPITAL_COMMUNITY): Payer: Self-pay | Admitting: Gastroenterology

## 2015-06-23 ENCOUNTER — Encounter (HOSPITAL_COMMUNITY): Payer: Self-pay | Admitting: *Deleted

## 2015-06-23 NOTE — Progress Notes (Signed)
Pt is a resident at [facility]. Pt denies SOB and chest pain. Eusebio Me, caregiver, denies that pt is under the care of a cardiologist. Caregiver not sure if pt had a stress test, echo  cardiac cath, EKG and chest x ray; will furnish info on DOS if so. Caregiver confirmed receiving fax and verbalized understanding of all pre-op instructions.

## 2015-06-23 NOTE — Pre-Procedure Instructions (Signed)
    Karina Clarke  06/23/2015      [id number] - [place], [place] - [place] [id number] [name] [id number] Phone: [phone] Fax: [phone]  [name], [place] - [place] [address] Phone: [phone] Fax: [phone]    Your procedure is scheduled on Monday, June 26, 2015  Report to [facility] Admitting at 8:30 A.M.  Call this number if you have problems the morning of surgery:  [phone]   Remember:  Do not eat food or drink liquids after midnight on Sunday, June 25, 2015  Take these medicines the morning of surgery with A SIP OF WATER :EPZICOM, REYATAZ, ISENTRESS, VALTREX, SYMMETREL, HALDOL  Stop taking Aspirin, vitamins, and herbal medications. Do not take any NSAIDs ie: Ibuprofen, Advil, Naproxen or any medication containing Aspirin; stop now.   Do not wear jewelry, make-up or nail polish.  Do not wear lotions, powders, or perfumes.  You may NOT  wear deodorant.  Do not shave 48 hours prior to surgery.   Do not bring valuables to the hospital.  [facility] is not responsible for any belongings or valuables.  Contacts, dentures or bridgework may not be worn into surgery.  Leave your suitcase in the car.  After surgery it may be brought to your room.  For patients admitted to the hospital, discharge time will be determined by your treatment team.  Patients discharged the day of surgery will not be allowed to drive home.   Name and phone number of your driver:    Special instructions:  Please read over the following fact sheets that you were given. Anesthesia Post-op Instructions

## 2015-06-26 ENCOUNTER — Ambulatory Visit
Admission: RE | Admit: 2015-06-26 | Discharge: 2015-06-26 | Disposition: A | Discharge status: Home / Self Care | Payer: Medicaid Other | Source: Ambulatory Visit | Attending: General Surgery | Admitting: General Surgery

## 2015-06-26 ENCOUNTER — Encounter (HOSPITAL_COMMUNITY): Payer: Self-pay | Admitting: Certified Registered"

## 2015-06-26 ENCOUNTER — Encounter (HOSPITAL_COMMUNITY): Payer: Medicaid Other | Admitting: Certified Registered"

## 2015-06-26 ENCOUNTER — Ambulatory Visit (HOSPITAL_COMMUNITY)
Admission: RE | Admit: 2015-06-26 | Discharge: 2015-06-26 | Disposition: A | Discharge status: Home / Self Care | Payer: Medicaid Other | Source: Ambulatory Visit | Attending: General Surgery | Admitting: General Surgery

## 2015-06-26 ENCOUNTER — Encounter (HOSPITAL_COMMUNITY)
Admission: RE | Disposition: A | Discharge status: Home / Self Care | Payer: Self-pay | Source: Ambulatory Visit | Attending: General Surgery

## 2015-06-26 DIAGNOSIS — N6092 Unspecified benign mammary dysplasia of left breast: Secondary | ICD-10-CM

## 2015-06-26 HISTORY — DX: Unspecified benign mammary dysplasia of left breast: N60.92

## 2015-06-26 HISTORY — DX: Pain in right hip: M25.551

## 2015-06-26 HISTORY — DX: Other chronic pain: G89.29

## 2015-06-26 SURGERY — BREAST LUMPECTOMY WITH NEEDLE LOCALIZATION
Anesthesia: General | Laterality: Left

## 2015-06-26 NOTE — H&P (Signed)
Karina Clarke 05/22/2015 2:55 PM Location: [facility] Patient #: [id number] DOB: 1961-01-20 Single / Language: Karina Clarke / Race: Black or African American Female  History of Present Illness Karina Clarke. Marlou Starks MD; 05/22/2015 3:20 PM) Patient words: breast mass.  The patient is a 54 year old female who presents with a breast mass. We are asked to see the patient in consultation by Dr. Shon Hale to evaluate her for abnormal calcifications in the upper outer left breast. The patient is a 54 year old white female who recently went for a routine screening mammogram. At that time she was found to have a 4 mm cluster of abnormal calcifications in the upper outer quadrant. This was biopsied and came back as atypical lobular hyperplasia. A marker was not able to be deployed. She denies any breast pain. She denies any discharge from the nipple. As any personal or family history of breast cancer.   Other Problems Karina Clarke, [place]; 05/22/2015 2:56 PM) Depression HIV-positive Lump In Breast Oophorectomy  Past Surgical History Karina Clarke, [place]; 05/22/2015 2:56 PM) Breast Biopsy Left. Hemorrhoidectomy Hysterectomy (not due to cancer) - Complete Oral Surgery Resection of Stomach  Diagnostic Studies History Karina Clarke, [place]; 05/22/2015 2:56 PM) Colonoscopy never Mammogram within last year Pap Smear 1-5 years ago  Allergies Karina Clarke, CMA; 05/22/2015 3:00 PM) No Known Drug Allergies06/27/2016  Medication History Karina Clarke, CMA; 05/22/2015 3:00 PM) Reyataz (200MG  Capsule, Oral) Active. Benztropine Mesylate (1MG  Tablet, Oral) Active. Haloperidol (5MG  Tablet, Oral) Active. Isentress (400MG  Tablet, Oral) Active. ValACYclovir HCl (1GM Tablet, Oral) Active. Divalproex Sodium (500MG  Tablet DR, Oral) Active. QUEtiapine Fumarate (200MG  Tablet, Oral) Active. Ibuprofen (800MG  Tablet, Oral) Active.  Social History Karina Clarke,  [place]; 05/22/2015 2:56 PM) Alcohol use Recently quit alcohol use. Illicit drug use Recently quit drug use. No caffeine use Tobacco use Current every day smoker.  Family History Karina Clarke, [place]; 05/22/2015 2:56 PM) Alcohol Abuse Brother, Father, Sister. Arthritis Mother.  Pregnancy / Birth History Karina Clarke, [place]; 05/22/2015 2:56 PM) Age of menopause 69-50 Gravida 2 Irregular periods Maternal age 25-25 Para 1  Review of Systems Karina Clarke CMA; 05/22/2015 2:56 PM) General Present- Chills and Night Sweats. Not Present- Appetite Loss, Fatigue, Fever, Weight Gain and Weight Loss. Skin Not Present- Change in Wart/Mole, Dryness, Hives, Jaundice, New Lesions, Non-Healing Wounds, Rash and Ulcer. HEENT Not Present- Earache, Hearing Loss, Hoarseness, Nose Bleed, Oral Ulcers, Ringing in the Ears, Seasonal Allergies, Sinus Pain, Sore Throat, Visual Disturbances, Wears glasses/contact lenses and Yellow Eyes. Respiratory Not Present- Bloody sputum, Chronic Cough, Difficulty Breathing, Snoring and Wheezing. Breast Not Present- Breast Mass, Breast Pain, Nipple Discharge and Skin Changes. Cardiovascular Not Present- Chest Pain, Difficulty Breathing Lying Down, Leg Cramps, Palpitations, Rapid Heart Rate, Shortness of Breath and Swelling of Extremities. Gastrointestinal Not Present- Abdominal Pain, Bloating, Bloody Stool, Change in Bowel Habits, Chronic diarrhea, Constipation, Difficulty Swallowing, Excessive gas, Gets full quickly at meals, Hemorrhoids, Indigestion, Nausea, Rectal Pain and Vomiting. Female Genitourinary Not Present- Frequency, Nocturia, Painful Urination, Pelvic Pain and Urgency. Neurological Present- Trouble walking. Not Present- Decreased Memory, Fainting, Headaches, Numbness, Seizures, Tingling, Tremor and Weakness. Psychiatric Present- Anxiety and Fearful. Not Present- Bipolar, Change in Sleep Pattern, Depression and Frequent crying. Endocrine Present- Hot  flashes. Not Present- Cold Intolerance, Excessive Hunger, Hair Changes, Heat Intolerance and New Diabetes. Hematology Present- HIV. Not Present- Easy Bruising, Excessive bleeding, Gland problems and Persistent Infections.   Vitals [employer] R. Clarke CMA; 05/22/2015 2:56 PM)  05/22/2015 2:55 PM Weight: 212.5 lb Height: 72in Body Surface Area: 2.21 m Body Mass Index: 28.82 kg/m BP: 120/82 (Sitting, Left Arm, Standard)    Physical Exam Eddie Dibbles S. Marlou Starks MD; 05/22/2015 3:20 PM) General Mental Status-Alert. General Appearance-Consistent with stated age. Hydration-Well hydrated. Voice-Normal.  Head and Neck Head-normocephalic, atraumatic with no lesions or palpable masses. Trachea-midline. Thyroid Gland Characteristics - normal size and consistency.  Eye Eyeball - Bilateral-Extraocular movements intact. Sclera/Conjunctiva - Bilateral-No scleral icterus.  Chest and Lung Exam Chest and lung exam reveals -quiet, even and easy respiratory effort with no use of accessory muscles and on auscultation, normal breath sounds, no adventitious sounds and normal vocal resonance. Inspection Chest Wall - Normal. Back - normal.  Breast Note: There is no palpable mass in either breast. There is no palpable axillary, supraclavicular, or cervical lymphadenopathy.   Cardiovascular Cardiovascular examination reveals -normal heart sounds, regular rate and rhythm with no murmurs and normal pedal pulses bilaterally.  Abdomen Inspection Inspection of the abdomen reveals - No Hernias. Skin - Scar - no surgical scars. Palpation/Percussion Palpation and Percussion of the abdomen reveal - Soft, Non Tender, No Rebound tenderness, No Rigidity (guarding) and No hepatosplenomegaly. Auscultation Auscultation of the abdomen reveals - Bowel sounds normal.  Neurologic Neurologic evaluation reveals -alert and oriented x 3 with no impairment of recent or remote memory. Mental  Status-Normal.  Musculoskeletal Normal Exam - Left-Upper Extremity Strength Normal and Lower Extremity Strength Normal. Normal Exam - Right-Upper Extremity Strength Normal and Lower Extremity Strength Normal.  Lymphatic Head & Neck  General Head & Neck Lymphatics: Bilateral - Description - Normal. Axillary  General Axillary Region: Bilateral - Description - Normal. Tenderness - Non Tender. Femoral & Inguinal  Generalized Femoral & Inguinal Lymphatics: Bilateral - Description - Normal. Tenderness - Non Tender.    Assessment & Plan Eddie Dibbles S. Marlou Starks MD; 05/22/2015 3:24 PM) ATYPICAL LOBULAR HYPERPLASIA OF LEFT BREAST (610.8  N60.92) Impression: The patient has a small cluster of abnormal calcifications in the upper outer left breast that was biopsied and came back as atypical lobular hyperplasia. Because of the cells appearing atypical and because of the appearance of the calcifications I think it would be reasonable to remove this area. I have discussed with her in detail the risks and benefits of the operation to remove this spot from the breast as well as some of the technical aspects and she understands and wishes to proceed. She will need a left breast wire localized lumpectomy. We will ask for her power of attorney to come with her to the surgery date     Signed by Luella Cook, MD (05/22/2015 3:28 PM)

## 2015-06-26 NOTE — Interval H&P Note (Signed)
History and Physical Interval Note:  06/26/2015 7:23 AM  Karina Clarke  has presented today for surgery, with the diagnosis of Left Breast Atypical Hyperplasia  The various methods of treatment have been discussed with the patient and family. After consideration of risks, benefits and other options for treatment, the patient has consented to  Procedure(s): LEFT BREAST LUMPECTOMY WITH NEEDLE LOCALIZATION (Left) as a surgical intervention .  The patient's history has been reviewed, patient examined, no change in status, stable for surgery.  I have reviewed the patient's chart and labs.  Questions were answered to the patient's satisfaction.     TOTH III,Yezenia Fredrick S

## 2015-06-26 NOTE — Anesthesia Preprocedure Evaluation (Deleted)
Anesthesia Evaluation  Patient identified by MRN, date of birth, ID band Patient awake    Reviewed: Allergy & Precautions, NPO status , Patient's Chart, lab work & pertinent test results  Airway Mallampati: I  TM Distance: >3 FB     Dental  (+) Edentulous Upper, Edentulous Lower   Pulmonary Current Smoker,  breath sounds clear to auscultation        Cardiovascular negative cardio ROS  Rhythm:Regular Rate:Normal     Neuro/Psych PSYCHIATRIC DISORDERS Schizophrenia    GI/Hepatic negative GI ROS,   Endo/Other    Renal/GU      Musculoskeletal   Abdominal   Peds  Hematology   Anesthesia Other Findings   Reproductive/Obstetrics                             Anesthesia Physical  Anesthesia Plan  ASA: III  Anesthesia Plan: MAC   Post-op Pain Management:    Induction: Intravenous  Airway Management Planned: Simple Face Mask  Additional Equipment:   Intra-op Plan:   Post-operative Plan:   Informed Consent: I have reviewed the patients History and Physical, chart, labs and discussed the procedure including the risks, benefits and alternatives for the proposed anesthesia with the patient or authorized representative who has indicated his/her understanding and acceptance.   Dental advisory given  Plan Discussed with: CRNA  Anesthesia Plan Comments:         Anesthesia Quick Evaluation

## 2015-06-26 NOTE — H&P (Signed)
  The patient showed up to the hospital for surgery but never went to the breast center to have the wire placed. The operation was therefore cancelled and will be rescheduled.

## 2015-07-24 ENCOUNTER — Encounter (HOSPITAL_BASED_OUTPATIENT_CLINIC_OR_DEPARTMENT_OTHER): Payer: Self-pay | Admitting: *Deleted

## 2015-07-25 ENCOUNTER — Other Ambulatory Visit: Payer: Self-pay | Admitting: General Surgery

## 2015-07-25 DIAGNOSIS — N6092 Unspecified benign mammary dysplasia of left breast: Secondary | ICD-10-CM

## 2015-07-28 ENCOUNTER — Ambulatory Visit (HOSPITAL_BASED_OUTPATIENT_CLINIC_OR_DEPARTMENT_OTHER): Payer: Medicaid Other | Admitting: Anesthesiology

## 2015-07-28 ENCOUNTER — Ambulatory Visit
Admission: RE | Admit: 2015-07-28 | Discharge: 2015-07-28 | Disposition: A | Discharge status: Home / Self Care | Payer: Medicaid Other | Source: Ambulatory Visit | Attending: General Surgery | Admitting: General Surgery

## 2015-07-28 ENCOUNTER — Encounter (HOSPITAL_BASED_OUTPATIENT_CLINIC_OR_DEPARTMENT_OTHER)
Admission: RE | Disposition: A | Discharge status: Home / Self Care | Payer: Self-pay | Source: Ambulatory Visit | Attending: General Surgery

## 2015-07-28 ENCOUNTER — Encounter (HOSPITAL_BASED_OUTPATIENT_CLINIC_OR_DEPARTMENT_OTHER): Payer: Self-pay | Admitting: Anesthesiology

## 2015-07-28 ENCOUNTER — Ambulatory Visit (HOSPITAL_BASED_OUTPATIENT_CLINIC_OR_DEPARTMENT_OTHER)
Admission: RE | Admit: 2015-07-28 | Discharge: 2015-07-28 | Disposition: A | Discharge status: Home / Self Care | Payer: Medicaid Other | Source: Ambulatory Visit | Attending: General Surgery | Admitting: General Surgery

## 2015-07-28 DIAGNOSIS — N62 Hypertrophy of breast: Secondary | ICD-10-CM | POA: Insufficient documentation

## 2015-07-28 DIAGNOSIS — Z21 Asymptomatic human immunodeficiency virus [HIV] infection status: Secondary | ICD-10-CM | POA: Diagnosis not present

## 2015-07-28 HISTORY — DX: Personality disorder, unspecified: F60.9

## 2015-07-28 HISTORY — DX: Other abnormalities of gait and mobility: R26.89

## 2015-07-28 HISTORY — DX: Unspecified benign mammary dysplasia of left breast: N60.92

## 2015-07-28 HISTORY — DX: Abdominal distension (gaseous): R14.0

## 2015-07-28 HISTORY — DX: Paranoid schizophrenia: F20.0

## 2015-07-28 HISTORY — DX: Family history of other specified conditions: Z84.89

## 2015-07-28 HISTORY — DX: Pure hypercholesterolemia, unspecified: E78.00

## 2015-07-28 HISTORY — DX: Complete loss of teeth, unspecified cause, unspecified class: K08.109

## 2015-07-28 HISTORY — PX: BREAST LUMPECTOMY WITH NEEDLE LOCALIZATION: SHX5759

## 2015-07-28 HISTORY — DX: Anodontia: K00.0

## 2015-07-28 SURGERY — BREAST LUMPECTOMY WITH NEEDLE LOCALIZATION
Anesthesia: General | Site: Breast | Laterality: Left

## 2015-07-28 MED ORDER — DEXAMETHASONE SODIUM PHOSPHATE 10 MG/ML IJ SOLN
INTRAMUSCULAR | Status: AC
  Filled 2015-07-28: qty 1

## 2015-07-28 MED ORDER — FENTANYL CITRATE (PF) 100 MCG/2ML IJ SOLN: 50.0000 ug | INTRAMUSCULAR | Status: DC | PRN

## 2015-07-28 MED ORDER — LIDOCAINE HCL (CARDIAC) 20 MG/ML IV SOLN
INTRAVENOUS | Status: DC | PRN
  Administered 2015-07-28: 50 mg via INTRAVENOUS

## 2015-07-28 MED ORDER — ONDANSETRON HCL 4 MG/2ML IJ SOLN
INTRAMUSCULAR | Status: DC | PRN
  Administered 2015-07-28: 4 mg via INTRAVENOUS

## 2015-07-28 MED ORDER — BUPIVACAINE HCL (PF) 0.25 % IJ SOLN
INTRAMUSCULAR | Status: DC | PRN
  Administered 2015-07-28: 20 mL

## 2015-07-28 MED ORDER — FENTANYL CITRATE (PF) 100 MCG/2ML IJ SOLN
INTRAMUSCULAR | Status: DC | PRN
  Administered 2015-07-28: 100 ug via INTRAVENOUS

## 2015-07-28 MED ORDER — FENTANYL CITRATE (PF) 100 MCG/2ML IJ SOLN
INTRAMUSCULAR | Status: AC
  Filled 2015-07-28: qty 4

## 2015-07-28 MED ORDER — PROPOFOL 10 MG/ML IV BOLUS
INTRAVENOUS | Status: DC | PRN
  Administered 2015-07-28: 150 mg via INTRAVENOUS

## 2015-07-28 MED ORDER — GLYCOPYRROLATE 0.2 MG/ML IJ SOLN: 0.2000 mg | Freq: Once | INTRAMUSCULAR | Status: DC | PRN

## 2015-07-28 MED ORDER — HYDROMORPHONE HCL 1 MG/ML IJ SOLN
0.2500 mg | INTRAMUSCULAR | Status: DC | PRN
  Administered 2015-07-28 (×2): 0.5 mg via INTRAVENOUS

## 2015-07-28 MED ORDER — HYDROMORPHONE HCL 1 MG/ML IJ SOLN
INTRAMUSCULAR | Status: AC
  Filled 2015-07-28: qty 1

## 2015-07-28 MED ORDER — SCOPOLAMINE 1 MG/3DAYS TD PT72: 1.0000 | MEDICATED_PATCH | Freq: Once | TRANSDERMAL | Status: DC | PRN

## 2015-07-28 MED ORDER — DEXAMETHASONE SODIUM PHOSPHATE 4 MG/ML IJ SOLN
INTRAMUSCULAR | Status: DC | PRN
  Administered 2015-07-28: 10 mg via INTRAVENOUS

## 2015-07-28 MED ORDER — CEFAZOLIN SODIUM-DEXTROSE 2-3 GM-% IV SOLR
INTRAVENOUS | Status: AC
  Filled 2015-07-28: qty 50

## 2015-07-28 MED ORDER — PROMETHAZINE HCL 25 MG/ML IJ SOLN
6.2500 mg | INTRAMUSCULAR | Status: DC | PRN
Start: 2015-07-28 — End: 2015-07-28

## 2015-07-28 MED ORDER — MIDAZOLAM HCL 2 MG/2ML IJ SOLN
INTRAMUSCULAR | Status: AC
  Filled 2015-07-28: qty 4

## 2015-07-28 MED ORDER — MIDAZOLAM HCL 2 MG/2ML IJ SOLN: 1.0000 mg | INTRAMUSCULAR | Status: DC | PRN

## 2015-07-28 MED ORDER — LIDOCAINE HCL (CARDIAC) 20 MG/ML IV SOLN
INTRAVENOUS | Status: AC
  Filled 2015-07-28: qty 5

## 2015-07-28 MED ORDER — LACTATED RINGERS IV SOLN
INTRAVENOUS | Status: DC
  Administered 2015-07-28 (×2): via INTRAVENOUS

## 2015-07-28 MED ORDER — PROPOFOL 10 MG/ML IV BOLUS
INTRAVENOUS | Status: AC
  Filled 2015-07-28: qty 40

## 2015-07-28 MED ORDER — CHLORHEXIDINE GLUCONATE 4 % EX LIQD: 1.0000 "application " | Freq: Once | CUTANEOUS | Status: DC

## 2015-07-28 MED ORDER — ONDANSETRON HCL 4 MG/2ML IJ SOLN
INTRAMUSCULAR | Status: AC
  Filled 2015-07-28: qty 2

## 2015-07-28 MED ORDER — CEFAZOLIN SODIUM-DEXTROSE 2-3 GM-% IV SOLR
2.0000 g | INTRAVENOUS | Status: AC
  Administered 2015-07-28: 2 g via INTRAVENOUS

## 2015-07-28 MED ORDER — OXYCODONE-ACETAMINOPHEN 5-325 MG PO TABS: 1.0000 | ORAL_TABLET | ORAL | Status: DC | PRN

## 2015-07-28 MED ORDER — MIDAZOLAM HCL 5 MG/5ML IJ SOLN
INTRAMUSCULAR | Status: DC | PRN
  Administered 2015-07-28: 2 mg via INTRAVENOUS

## 2015-07-28 SURGICAL SUPPLY — 44 items
APPLIER CLIP 9.375 MED OPEN (MISCELLANEOUS) ×3
BLADE SURG 15 STRL LF DISP TIS (BLADE) ×1 IMPLANT
BLADE SURG 15 STRL SS (BLADE) ×2
CANISTER SUCT 1200ML W/VALVE (MISCELLANEOUS) ×3 IMPLANT
CHLORAPREP W/TINT 26ML (MISCELLANEOUS) ×3 IMPLANT
CLIP APPLIE 9.375 MED OPEN (MISCELLANEOUS) ×1 IMPLANT
CLIP TI WIDE RED SMALL 6 (CLIP) IMPLANT
COVER BACK TABLE 60X90IN (DRAPES) ×3 IMPLANT
COVER MAYO STAND STRL (DRAPES) ×3 IMPLANT
DECANTER SPIKE VIAL GLASS SM (MISCELLANEOUS) ×3 IMPLANT
DEVICE DUBIN W/COMP PLATE 8390 (MISCELLANEOUS) ×3 IMPLANT
DRAPE LAPAROSCOPIC ABDOMINAL (DRAPES) ×3 IMPLANT
DRAPE UTILITY XL STRL (DRAPES) ×3 IMPLANT
ELECT COATED BLADE 2.86 ST (ELECTRODE) ×3 IMPLANT
ELECT REM PT RETURN 9FT ADLT (ELECTROSURGICAL) ×3
ELECTRODE REM PT RTRN 9FT ADLT (ELECTROSURGICAL) ×1 IMPLANT
GLOVE BIO SURGEON STRL SZ7 (GLOVE) ×3 IMPLANT
GLOVE BIO SURGEON STRL SZ7.5 (GLOVE) ×3 IMPLANT
GLOVE BIOGEL PI IND STRL 8 (GLOVE) ×1 IMPLANT
GLOVE BIOGEL PI INDICATOR 8 (GLOVE) ×2
GLOVE SURG SS PI 7.0 STRL IVOR (GLOVE) ×3 IMPLANT
GLOVE SURG SS PI 7.5 STRL IVOR (GLOVE) ×3 IMPLANT
GOWN STRL REUS W/ TWL LRG LVL3 (GOWN DISPOSABLE) ×2 IMPLANT
GOWN STRL REUS W/ TWL XL LVL3 (GOWN DISPOSABLE) ×1 IMPLANT
GOWN STRL REUS W/TWL LRG LVL3 (GOWN DISPOSABLE) ×4
GOWN STRL REUS W/TWL XL LVL3 (GOWN DISPOSABLE) ×2
KIT MARKER MARGIN INK (KITS) IMPLANT
LIQUID BAND (GAUZE/BANDAGES/DRESSINGS) ×3 IMPLANT
NEEDLE HYPO 25X1 1.5 SAFETY (NEEDLE) ×3 IMPLANT
NS IRRIG 1000ML POUR BTL (IV SOLUTION) ×3 IMPLANT
PACK BASIN DAY SURGERY FS (CUSTOM PROCEDURE TRAY) ×3 IMPLANT
PENCIL BUTTON HOLSTER BLD 10FT (ELECTRODE) ×3 IMPLANT
SLEEVE SCD COMPRESS KNEE MED (MISCELLANEOUS) ×3 IMPLANT
SPONGE LAP 18X18 X RAY DECT (DISPOSABLE) ×3 IMPLANT
STAPLER VISISTAT 35W (STAPLE) IMPLANT
SUT MON AB 4-0 PC3 18 (SUTURE) ×3 IMPLANT
SUT SILK 2 0 SH (SUTURE) ×3 IMPLANT
SUT VICRYL 3-0 CR8 SH (SUTURE) ×3 IMPLANT
SYR CONTROL 10ML LL (SYRINGE) ×3 IMPLANT
TOWEL OR 17X24 6PK STRL BLUE (TOWEL DISPOSABLE) ×3 IMPLANT
TOWEL OR NON WOVEN STRL DISP B (DISPOSABLE) ×3 IMPLANT
TUBE CONNECTING 20'X1/4 (TUBING) ×1
TUBE CONNECTING 20X1/4 (TUBING) ×2 IMPLANT
YANKAUER SUCT BULB TIP NO VENT (SUCTIONS) ×3 IMPLANT

## 2015-07-28 NOTE — Transfer of Care (Signed)
Immediate Anesthesia Transfer of Care Note  Patient: Rhia Meakin  Procedure(s) Performed: Procedure(s): BREAST LUMPECTOMY WITH NEEDLE LOCALIZATION (Left)  Patient Location: PACU  Anesthesia Type:General  Level of Consciousness: awake and patient cooperative  Airway & Oxygen Therapy: Patient Spontanous Breathing and Patient connected to face mask oxygen  Post-op Assessment: Report given to RN and Post -op Vital signs reviewed and stable  Post vital signs: Reviewed and stable  Last Vitals:  Filed Vitals:   07/28/15 1229  BP: 135/83  Pulse: 98  Temp: 36.7 C  Resp: 18    Complications: No apparent anesthesia complications

## 2015-07-28 NOTE — Anesthesia Postprocedure Evaluation (Signed)
  Anesthesia Post-op Note  Patient: Karina Clarke  Procedure(s) Performed: Procedure(s): BREAST LUMPECTOMY WITH NEEDLE LOCALIZATION (Left)  Patient Location: PACU  Anesthesia Type:General  Level of Consciousness: awake and alert   Airway and Oxygen Therapy: Patient Spontanous Breathing  Post-op Pain: mild  Post-op Assessment: Post-op Vital signs reviewed              Post-op Vital Signs: Reviewed  Last Vitals:  Filed Vitals:   07/28/15 1555  BP: 116/69  Pulse: 88  Temp: 36.6 C  Resp: 18    Complications: No apparent anesthesia complications

## 2015-07-28 NOTE — Discharge Instructions (Signed)

## 2015-07-28 NOTE — H&P (Signed)
Karina Clarke 05/22/2015 2:55 PM Location: [facility] Patient #: [id number] DOB: 02-05-1961 Single / Language: Karina Clarke / Race: Black or African American Female  History of Present Illness [name]. Marlou Starks MD; 05/22/2015 3:20 PM) Patient words: breast mass.  The patient is a 54 year old female who presents with a breast mass. We are asked to see the patient in consultation by Dr. Shon Hale to evaluate her for abnormal calcifications in the upper outer left breast. The patient is a 54 year old white female who recently went for a routine screening mammogram. At that time she was found to have a 4 mm cluster of abnormal calcifications in the upper outer quadrant. This was biopsied and came back as atypical lobular hyperplasia. A marker was not able to be deployed. She denies any breast pain. She denies any discharge from the nipple. As any personal or family history of breast cancer.   Other Problems Karina Clarke, [place]; 05/22/2015 2:56 PM) Depression HIV-positive Lump In Breast Oophorectomy  Past Surgical History Karina Clarke, [place]; 05/22/2015 2:56 PM) Breast Biopsy Left. Hemorrhoidectomy Hysterectomy (not due to cancer) - Complete Oral Surgery Resection of Stomach  Diagnostic Studies History Karina Clarke, [place]; 05/22/2015 2:56 PM) Colonoscopy never Mammogram within last year Pap Smear 1-5 years ago  Allergies Karina Clarke, CMA; 05/22/2015 3:00 PM) No Known Drug Allergies06/27/2016  Medication History Karina Clarke, CMA; 05/22/2015 3:00 PM) Reyataz (200MG  Capsule, Oral) Active. Benztropine Mesylate (1MG  Tablet, Oral) Active. Haloperidol (5MG  Tablet, Oral) Active. Isentress (400MG  Tablet, Oral) Active. ValACYclovir HCl (1GM Tablet, Oral) Active. Divalproex Sodium (500MG  Tablet DR, Oral) Active. QUEtiapine Fumarate (200MG  Tablet, Oral) Active. Ibuprofen (800MG  Tablet, Oral) Active.  Social History Karina Clarke,  [place]; 05/22/2015 2:56 PM) Alcohol use Recently quit alcohol use. Illicit drug use Recently quit drug use. No caffeine use Tobacco use Current every day smoker.  Family History Karina Clarke, [place]; 05/22/2015 2:56 PM) Alcohol Abuse Brother, Father, Sister. Arthritis Mother.  Pregnancy / Birth History Karina Clarke, [place]; 05/22/2015 2:56 PM) Age of menopause 51-50 Gravida 2 Irregular periods Maternal age 42-25 Para 1  Review of Systems Karina Clarke CMA; 05/22/2015 2:56 PM) General Present- Chills and Night Sweats. Not Present- Appetite Loss, Fatigue, Fever, Weight Gain and Weight Loss. Skin Not Present- Change in Wart/Mole, Dryness, Hives, Jaundice, New Lesions, Non-Healing Wounds, Rash and Ulcer. HEENT Not Present- Earache, Hearing Loss, Hoarseness, Nose Bleed, Oral Ulcers, Ringing in the Ears, Seasonal Allergies, Sinus Pain, Sore Throat, Visual Disturbances, Wears glasses/contact lenses and Yellow Eyes. Respiratory Not Present- Bloody sputum, Chronic Cough, Difficulty Breathing, Snoring and Wheezing. Breast Not Present- Breast Mass, Breast Pain, Nipple Discharge and Skin Changes. Cardiovascular Not Present- Chest Pain, Difficulty Breathing Lying Down, Leg Cramps, Palpitations, Rapid Heart Rate, Shortness of Breath and Swelling of Extremities. Gastrointestinal Not Present- Abdominal Pain, Bloating, Bloody Stool, Change in Bowel Habits, Chronic diarrhea, Constipation, Difficulty Swallowing, Excessive gas, Gets full quickly at meals, Hemorrhoids, Indigestion, Nausea, Rectal Pain and Vomiting. Female Genitourinary Not Present- Frequency, Nocturia, Painful Urination, Pelvic Pain and Urgency. Neurological Present- Trouble walking. Not Present- Decreased Memory, Fainting, Headaches, Numbness, Seizures, Tingling, Tremor and Weakness. Psychiatric Present- Anxiety and Fearful. Not Present- Bipolar, Change in Sleep Pattern, Depression and Frequent crying. Endocrine Present- Hot  flashes. Not Present- Cold Intolerance, Excessive Hunger, Hair Changes, Heat Intolerance and New Diabetes. Hematology Present- HIV. Not Present- Easy Bruising, Excessive bleeding, Gland problems and Persistent Infections.   Vitals [employer] R. Clarke CMA; 05/22/2015 2:56 PM)  05/22/2015 2:55 PM Weight: 212.5 lb Height: 72in Body Surface Area: 2.21 m Body Mass Index: 28.82 kg/m BP: 120/82 (Sitting, Left Arm, Standard)    Physical Exam Eddie Dibbles S. Marlou Starks MD; 05/22/2015 3:20 PM) General Mental Status-Alert. General Appearance-Consistent with stated age. Hydration-Well hydrated. Voice-Normal.  Head and Neck Head-normocephalic, atraumatic with no lesions or palpable masses. Trachea-midline. Thyroid Gland Characteristics - normal size and consistency.  Eye Eyeball - Bilateral-Extraocular movements intact. Sclera/Conjunctiva - Bilateral-No scleral icterus.  Chest and Lung Exam Chest and lung exam reveals -quiet, even and easy respiratory effort with no use of accessory muscles and on auscultation, normal breath sounds, no adventitious sounds and normal vocal resonance. Inspection Chest Wall - Normal. Back - normal.  Breast Note: There is no palpable mass in either breast. There is no palpable axillary, supraclavicular, or cervical lymphadenopathy.   Cardiovascular Cardiovascular examination reveals -normal heart sounds, regular rate and rhythm with no murmurs and normal pedal pulses bilaterally.  Abdomen Inspection Inspection of the abdomen reveals - No Hernias. Skin - Scar - no surgical scars. Palpation/Percussion Palpation and Percussion of the abdomen reveal - Soft, Non Tender, No Rebound tenderness, No Rigidity (guarding) and No hepatosplenomegaly. Auscultation Auscultation of the abdomen reveals - Bowel sounds normal.  Neurologic Neurologic evaluation reveals -alert and oriented x 3 with no impairment of recent or remote memory. Mental  Status-Normal.  Musculoskeletal Normal Exam - Left-Upper Extremity Strength Normal and Lower Extremity Strength Normal. Normal Exam - Right-Upper Extremity Strength Normal and Lower Extremity Strength Normal.  Lymphatic Head & Neck  General Head & Neck Lymphatics: Bilateral - Description - Normal. Axillary  General Axillary Region: Bilateral - Description - Normal. Tenderness - Non Tender. Femoral & Inguinal  Generalized Femoral & Inguinal Lymphatics: Bilateral - Description - Normal. Tenderness - Non Tender.    Assessment & Plan Eddie Dibbles S. Marlou Starks MD; 05/22/2015 3:24 PM) ATYPICAL LOBULAR HYPERPLASIA OF LEFT BREAST (610.8  N60.92) Impression: The patient has a small cluster of abnormal calcifications in the upper outer left breast that was biopsied and came back as atypical lobular hyperplasia. Because of the cells appearing atypical and because of the appearance of the calcifications I think it would be reasonable to remove this area. I have discussed with her in detail the risks and benefits of the operation to remove this spot from the breast as well as some of the technical aspects and she understands and wishes to proceed. She will need a left breast wire localized lumpectomy. We will ask for her power of attorney to come with her to the surgery date     Signed by Luella Cook, MD (05/22/2015 3:28 PM)

## 2015-07-28 NOTE — Interval H&P Note (Signed)
History and Physical Interval Note:  07/28/2015 1:29 PM  Karina Clarke  has presented today for surgery, with the diagnosis of atypical lobular hyperplasia left breast.  The various methods of treatment have been discussed with the patient and family. After consideration of risks, benefits and other options for treatment, the patient has consented to  Procedure(s): BREAST LUMPECTOMY WITH NEEDLE LOCALIZATION (Left) as a surgical intervention .  The patient's history has been reviewed, patient examined, no change in status, stable for surgery.  I have reviewed the patient's chart and labs.  Questions were answered to the patient's satisfaction.     TOTH III,Jovaun Levene S

## 2015-07-28 NOTE — Op Note (Signed)
07/28/2015  2:37 PM  PATIENT:  Karina Clarke  54 y.o. female  PRE-OPERATIVE DIAGNOSIS:  atypical lobular hyperplasia left breast  POST-OPERATIVE DIAGNOSIS:  atypical lobular hyperplasia left breast  PROCEDURE:  Procedure(s): BREAST LUMPECTOMY WITH NEEDLE LOCALIZATION (Left)  SURGEON:  Surgeon(s) and Role:    * Jovita Kussmaul, MD - Primary  PHYSICIAN ASSISTANT:   ASSISTANTS: none   ANESTHESIA:   general  EBL:  Total I/O In: 1500 [I.V.:1500] Out: -   BLOOD ADMINISTERED:none  DRAINS: none   LOCAL MEDICATIONS USED:  MARCAINE     SPECIMEN:  Source of Specimen:  left breast tissue  DISPOSITION OF SPECIMEN:  PATHOLOGY  COUNTS:  YES  TOURNIQUET:  * No tourniquets in log *  DICTATION: .Dragon Dictation  After informed consent was obtained the patient was brought to the operating room and placed in the supine position on the operating table. After adequate induction of general anesthesia the patient's left breast was prepped with ChloraPrep, allowed to dry, and draped in usual sterile manner. Earlier in the day the patient underwent a wire localization procedure and the wire was entering the left breast in the upper portion and headed inferiorly. A transversely oriented incision was made in the upper portion of the breast overlying the path of the wire. The incision was carried through the skin and subcutaneous tissue sharply with electrocautery. Once the breast tissue was entered the wire was more readily palpable. A circular portion of breast tissue was excised sharply with the electrocautery around the path of the wire. Once the specimen was removed it was oriented with a short stitch on the superior surface, a long stitch on the lateral surface, and the wire was entering the specimen anteriorly. A specimen radiograph was obtained that showed the calcifications in the specimen. The specimen was then sent to pathology for further evaluation. Hemostasis was achieved using the Bovie  electrocautery. The wound was infiltrated with quarter percent Marcaine and irrigated with saline. The deep layer of the wound was closed with layers of interrupted 3-0 Vicryl stitches. The skin was then closed with interrupted 4-0 Monocryl subcuticular stitches. Dermabond dressings were applied. The patient tolerated the procedure well. At the end of the case all needle sponge and instrument counts were correct. The patient was then awakened and taken to recovery in stable condition. PLAN OF CARE: Discharge to home after PACU  PATIENT DISPOSITION:  PACU - hemodynamically stable.   Delay start of Pharmacological VTE agent (>24hrs) due to surgical blood loss or risk of bleeding: not applicable

## 2015-07-28 NOTE — Anesthesia Procedure Notes (Signed)
Procedure Name: LMA Insertion Date/Time: 07/28/2015 1:54 PM Performed by: Toula Moos L Pre-anesthesia Checklist: Patient identified, Emergency Drugs available, Suction available, Patient being monitored and Timeout performed Patient Re-evaluated:Patient Re-evaluated prior to inductionOxygen Delivery Method: Circle System Utilized Preoxygenation: Pre-oxygenation with 100% oxygen Intubation Type: IV induction Ventilation: Mask ventilation without difficulty LMA: LMA inserted LMA Size: 4.0 Number of attempts: 1 Airway Equipment and Method: Bite block Placement Confirmation: positive ETCO2 Tube secured with: Tape Dental Injury: Teeth and Oropharynx as per pre-operative assessment

## 2015-07-28 NOTE — Anesthesia Preprocedure Evaluation (Addendum)
Anesthesia Evaluation  Patient identified by MRN, date of birth, ID band Patient awake    Reviewed: Allergy & Precautions, NPO status , Patient's Chart, lab work & pertinent test results  Airway Mallampati: II  TM Distance: >3 FB     Dental  (+) Edentulous Upper, Edentulous Lower   Pulmonary Current Smoker,  breath sounds clear to auscultation        Cardiovascular negative cardio ROS  Rhythm:Regular Rate:Normal     Neuro/Psych PSYCHIATRIC DISORDERS Schizophrenia negative neurological ROS     GI/Hepatic negative GI ROS, Neg liver ROS,   Endo/Other  negative endocrine ROS  Renal/GU negative Renal ROS     Musculoskeletal   Abdominal   Peds  Hematology negative hematology ROS (+)   Anesthesia Other Findings   Reproductive/Obstetrics                            Anesthesia Physical  Anesthesia Plan  ASA: III  Anesthesia Plan: General   Post-op Pain Management:    Induction: Intravenous  Airway Management Planned: LMA  Additional Equipment:   Intra-op Plan:   Post-operative Plan:   Informed Consent: I have reviewed the patients History and Physical, chart, labs and discussed the procedure including the risks, benefits and alternatives for the proposed anesthesia with the patient or authorized representative who has indicated his/her understanding and acceptance.   Dental advisory given  Plan Discussed with: CRNA  Anesthesia Plan Comments:        Anesthesia Quick Evaluation

## 2015-08-01 ENCOUNTER — Encounter (HOSPITAL_BASED_OUTPATIENT_CLINIC_OR_DEPARTMENT_OTHER): Payer: Self-pay | Admitting: General Surgery

## 2015-08-10 ENCOUNTER — Ambulatory Visit (INDEPENDENT_AMBULATORY_CARE_PROVIDER_SITE_OTHER): Payer: Medicaid Other | Admitting: Otolaryngology

## 2015-08-10 DIAGNOSIS — D1030 Benign neoplasm of unspecified part of mouth: Secondary | ICD-10-CM

## 2015-09-13 ENCOUNTER — Ambulatory Visit (INDEPENDENT_AMBULATORY_CARE_PROVIDER_SITE_OTHER): Payer: Medicaid Other | Admitting: Urology

## 2015-09-13 DIAGNOSIS — R351 Nocturia: Secondary | ICD-10-CM

## 2015-09-13 DIAGNOSIS — N3281 Overactive bladder: Secondary | ICD-10-CM | POA: Diagnosis not present

## 2015-09-13 DIAGNOSIS — R32 Unspecified urinary incontinence: Secondary | ICD-10-CM

## 2015-10-11 ENCOUNTER — Ambulatory Visit (INDEPENDENT_AMBULATORY_CARE_PROVIDER_SITE_OTHER): Payer: Medicaid Other | Admitting: Urology

## 2015-10-11 DIAGNOSIS — R351 Nocturia: Secondary | ICD-10-CM

## 2015-10-11 DIAGNOSIS — R32 Unspecified urinary incontinence: Secondary | ICD-10-CM

## 2015-10-11 DIAGNOSIS — N3281 Overactive bladder: Secondary | ICD-10-CM | POA: Diagnosis not present

## 2015-11-09 ENCOUNTER — Ambulatory Visit (INDEPENDENT_AMBULATORY_CARE_PROVIDER_SITE_OTHER): Payer: Medicaid Other | Admitting: Otolaryngology

## 2016-01-30 ENCOUNTER — Ambulatory Visit: Payer: Medicaid Other | Admitting: Nurse Practitioner

## 2016-02-07 ENCOUNTER — Ambulatory Visit: Payer: Medicaid Other | Admitting: Urology

## 2016-05-21 ENCOUNTER — Other Ambulatory Visit (HOSPITAL_COMMUNITY): Payer: Self-pay | Admitting: Internal Medicine

## 2016-05-21 ENCOUNTER — Ambulatory Visit (HOSPITAL_COMMUNITY)
Admission: RE | Admit: 2016-05-21 | Discharge: 2016-05-21 | Disposition: A | Discharge status: Home / Self Care | Payer: Medicaid Other | Source: Ambulatory Visit | Attending: Internal Medicine | Admitting: Internal Medicine

## 2016-05-21 DIAGNOSIS — M25512 Pain in left shoulder: Secondary | ICD-10-CM | POA: Insufficient documentation

## 2016-07-23 ENCOUNTER — Other Ambulatory Visit (HOSPITAL_COMMUNITY): Payer: Self-pay | Admitting: Internal Medicine

## 2016-07-23 ENCOUNTER — Ambulatory Visit (HOSPITAL_COMMUNITY)
Admission: RE | Admit: 2016-07-23 | Discharge: 2016-07-23 | Disposition: A | Discharge status: Home / Self Care | Payer: Medicaid Other | Source: Ambulatory Visit | Attending: Internal Medicine | Admitting: Internal Medicine

## 2016-07-23 DIAGNOSIS — M25561 Pain in right knee: Secondary | ICD-10-CM

## 2016-07-23 DIAGNOSIS — M25461 Effusion, right knee: Secondary | ICD-10-CM | POA: Diagnosis not present

## 2016-07-25 ENCOUNTER — Ambulatory Visit (INDEPENDENT_AMBULATORY_CARE_PROVIDER_SITE_OTHER): Payer: Medicaid Other | Admitting: Orthopaedic Surgery

## 2016-07-25 ENCOUNTER — Encounter: Payer: Self-pay | Admitting: Orthopaedic Surgery

## 2016-07-25 VITALS — BP 144/96 | HR 60 | Temp 97.7°F | Ht 72.0 in

## 2016-07-25 DIAGNOSIS — M25561 Pain in right knee: Secondary | ICD-10-CM

## 2016-07-25 DIAGNOSIS — M25562 Pain in left knee: Secondary | ICD-10-CM | POA: Diagnosis not present

## 2016-07-25 NOTE — Progress Notes (Signed)
Subjective: my knees hurt    Patient ID: Karina Clarke, female    DOB: Jul 02, 1961, 55 y.o.   MRN: [id number]  HPI She has pain in both knees, right more than the left.  She has seen Dr. Legrand Rams.  X-rays of the right knee were done on 07-23-16 and show: IMPRESSION: No acute bony abnormality.  Small joint effusion.  She has not been helped with heat, ice, rest, rubs.  She has no giving way, no locking.  She has swelling and popping.  She has no trauma, no redness.  She is in a local rest home.   Review of Systems  HENT: Negative for congestion.   Respiratory: Negative for cough and shortness of breath.   Cardiovascular: Negative for chest pain and leg swelling.  Endocrine: Positive for cold intolerance.  Musculoskeletal: Positive for arthralgias, gait problem and joint swelling.  Allergic/Immunologic: Positive for environmental allergies.  Psychiatric/Behavioral: The patient is nervous/anxious.    Past Medical History:  Diagnosis Date  . 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 Bloating    per sister  . Chronic right hip pain    sister states circulation problem in hip  . Family history of adverse reaction to anesthesia    sister has hx. of post-op N/V  . High cholesterol   . HIV (human immunodeficiency virus infection) ([place])   . Limp   . Mild intellectual disability   . No natural teeth    does not wear her dentures  . Paranoid schizophrenia ([place])   . Personality disorder   . Schizophrenia [facility])     Past Surgical History:  Procedure Laterality Date  . BREAST LUMPECTOMY WITH NEEDLE LOCALIZATION Left 07/28/2015   Procedure: BREAST LUMPECTOMY WITH NEEDLE LOCALIZATION;  Surgeon: Autumn Messing III, MD;  Location: [place];  Service: General;  Laterality: Left;  . COLONOSCOPY WITH PROPOFOL N/A 06/13/2015   SLF: 1. the left colon is redundant 2. the examination was otherwise normal 3. small internal hemorrhoids 4. moderate sized external hemorrhoids  .  HEMORRHOID SURGERY    . UMBILICAL HERNIA REPAIR      Current Outpatient Prescriptions on File Prior to Visit  Medication Sig Dispense Refill  . abacavir-lamiVUDine (EPZICOM) 600-300 MG per tablet Take 1 tablet by mouth daily.    [name] amantadine (SYMMETREL) 100 MG capsule Take 100 mg by mouth 2 (two) times daily.    [name] atazanavir (REYATAZ) 200 MG capsule Take 400 mg by mouth daily.     [name] atorvastatin (LIPITOR) 10 MG tablet Take 10 mg by mouth 2 (two) times daily.    . divalproex (DEPAKOTE) 500 MG DR tablet Take 500 mg by mouth See admin instructions. Take 1 tablet (500 mg) by mouth at 4pm and 8pm daily    . haloperidol (HALDOL) 10 MG tablet Take 20 mg by mouth 2 (two) times daily.    . haloperidol decanoate (HALDOL DECANOATE) 100 MG/ML injection Inject 100 mg into the muscle every 28 (twenty-eight) days. Last injection 06/09/15    . oxyCODONE-acetaminophen (ROXICET) 5-325 MG per tablet Take 1-2 tablets by mouth every 4 (four) hours as needed. 50 tablet 0  . QUEtiapine (SEROQUEL) 200 MG tablet Take 200 mg by mouth See admin instructions. Take 1 tablet (200 mg) by mouth with supper and at bedtime daily    . raltegravir (ISENTRESS) 400 MG tablet Take 400 mg by mouth 2 (two) times daily.    . valACYclovir (VALTREX) 1000 MG tablet Take 1,000 mg  by mouth daily.     [name] zolpidem (AMBIEN) 10 MG tablet Take 5 mg by mouth at bedtime.      No current facility-administered medications on file prior to visit.     Social History   Social History  . Marital status: Single    Spouse name: N/A  . Number of children: N/A  . Years of education: N/A   Occupational History  . Not on file.   Social History Main Topics  . Smoking status: Former Smoker    Packs/day: 1.00    Years: 18.00    Types: Cigarettes  . Smokeless tobacco: Never Used  . Alcohol use No  . Drug use: No  . Sexual activity: No   Other Topics Concern  . Not on file   Social History Narrative  . No narrative on file    Family  History  Problem Relation Age of Onset  . Anesthesia problems Sister     post-op N/V    BP (!) 144/96   Pulse 60   Temp 97.7 F (36.5 C)   Ht 6' (1.829 m)      Objective:   Physical Exam  Constitutional: She is oriented to person, place, and time. She appears well-developed and well-nourished.  HENT:  Head: Normocephalic and atraumatic.  Eyes: Conjunctivae and EOM are normal. Pupils are equal, round, and reactive to light.  Neck: Normal range of motion. Neck supple.  Cardiovascular: Normal rate, regular rhythm and intact distal pulses.   Pulmonary/Chest: Effort normal.  Abdominal: Soft.  Musculoskeletal: She exhibits tenderness (Both knees with some effusion, ROM right 0 to 105, left 0 to 110; crepitus both knees, limp to the right; no distal edema, NV intact.).  Neurological: She is alert and oriented to person, place, and time. She displays normal reflexes. No cranial nerve deficit. She exhibits normal muscle tone. Coordination normal.  Skin: Skin is warm and dry.  Psychiatric: She has a normal mood and affect. Her behavior is normal. Judgment and thought content normal.          Assessment & Plan:   Encounter Diagnoses  Name Primary?  . Right knee pain Yes  . Left knee pain      Return:  1 month  PROCEDURE NOTE:  The patient requests injections of both knees, verbal consent was obtained.  The left and right knee were individually prepped appropriately after time out was performed.   Sterile technique was observed and injection of 1 cc of Depo-Medrol 40 mg with several cc's of plain xylocaine. Anesthesia was provided by ethyl chloride and a 20-gauge needle was used to inject each knee area. The injections were tolerated well.  A band aid dressing was applied.  The patient was advised to apply ice later today and tomorrow to the injection sight as needed.   Call if any problem.  Forms for rest home completed.  Precautions discussed.  Electronically  Signed Sanjuana Kava, MD 8/31/20173:59 PM

## 2016-08-22 ENCOUNTER — Encounter: Payer: Self-pay | Admitting: Orthopaedic Surgery

## 2016-08-22 ENCOUNTER — Ambulatory Visit (INDEPENDENT_AMBULATORY_CARE_PROVIDER_SITE_OTHER): Payer: Medicaid Other | Admitting: Orthopaedic Surgery

## 2016-08-22 VITALS — BP 120/84 | HR 107 | Temp 97.7°F | Resp 14 | Ht 72.0 in | Wt 246.0 lb

## 2016-08-22 DIAGNOSIS — M25561 Pain in right knee: Secondary | ICD-10-CM

## 2016-08-22 DIAGNOSIS — F609 Personality disorder, unspecified: Secondary | ICD-10-CM | POA: Insufficient documentation

## 2016-08-22 NOTE — Progress Notes (Signed)
CC:  I have pain of my right knee. I would like an injection.  The patient has chronic pain of the right knee.  There is no recent trauma.  There is no redness.  Injections in the past have helped.  The knee has no redness, has an effusion and crepitus present.  ROM of the right knee is 0-110.  Impression:  Chronic knee pain right  Return: 1 month  PROCEDURE NOTE:  The patient requests injections of the right knee , verbal consent was obtained.  The right knee was prepped appropriately after time out was performed.   Sterile technique was observed and injection of 1 cc of Depo-Medrol 40 mg with several cc's of plain xylocaine. Anesthesia was provided by ethyl chloride and a 20-gauge needle was used to inject the knee area. The injection was tolerated well.  A band aid dressing was applied.  The patient was advised to apply ice later today and tomorrow to the injection sight as needed.  Electronically Signed Sanjuana Kava, MD 9/28/20172:56 PM

## 2016-09-19 ENCOUNTER — Ambulatory Visit (INDEPENDENT_AMBULATORY_CARE_PROVIDER_SITE_OTHER): Payer: Medicaid Other | Admitting: Orthopaedic Surgery

## 2016-09-19 ENCOUNTER — Encounter: Payer: Self-pay | Admitting: Orthopaedic Surgery

## 2016-09-19 VITALS — BP 106/75 | HR 93 | Temp 97.9°F | Ht 73.0 in | Wt 244.0 lb

## 2016-09-19 DIAGNOSIS — G8929 Other chronic pain: Secondary | ICD-10-CM

## 2016-09-19 DIAGNOSIS — M25561 Pain in right knee: Secondary | ICD-10-CM | POA: Diagnosis not present

## 2016-09-19 DIAGNOSIS — F609 Personality disorder, unspecified: Secondary | ICD-10-CM

## 2016-09-19 NOTE — Progress Notes (Signed)
CC:  I have pain of my right knee. I would like an injection.  The patient has chronic pain of the right knee.  There is no recent trauma.  There is no redness.  Injections in the past have helped.  The knee has no redness, has an effusion and crepitus present.  ROM of the right knee is 0-100.  Impression:  Chronic knee pain right  Return: 1 month  PROCEDURE NOTE:  The patient requests injections of the right knee , verbal consent was obtained.  The right knee was prepped appropriately after time out was performed.   Sterile technique was observed and injection of 1 cc of Depo-Medrol 40 mg with several cc's of plain xylocaine. Anesthesia was provided by ethyl chloride and a 20-gauge needle was used to inject the knee area. The injection was tolerated well.  A band aid dressing was applied.  The patient was advised to apply ice later today and tomorrow to the injection sight as needed.  Electronically Signed Sanjuana Kava, MD 10/26/20173:02 PM

## 2016-10-22 ENCOUNTER — Ambulatory Visit (INDEPENDENT_AMBULATORY_CARE_PROVIDER_SITE_OTHER): Payer: Medicaid Other | Admitting: Orthopaedic Surgery

## 2016-10-22 VITALS — BP 121/90 | HR 118 | Ht 72.0 in | Wt 246.0 lb

## 2016-10-22 DIAGNOSIS — G8929 Other chronic pain: Secondary | ICD-10-CM | POA: Diagnosis not present

## 2016-10-22 DIAGNOSIS — M25561 Pain in right knee: Secondary | ICD-10-CM | POA: Diagnosis not present

## 2016-10-22 DIAGNOSIS — F609 Personality disorder, unspecified: Secondary | ICD-10-CM

## 2016-10-22 NOTE — Progress Notes (Signed)
CC:  I have pain of my right knee. I would like an injection.  The patient has chronic pain of the right knee.  There is no recent trauma.  There is no redness.  Injections in the past have helped.  The knee has no redness, has an effusion and crepitus present.  ROM of the right knee is 0-110.  Impression:  Chronic knee pain right  Return: 6 weeks  PROCEDURE NOTE:  The patient requests injections of the right knee , verbal consent was obtained.  The right knee was prepped appropriately after time out was performed.   Sterile technique was observed and injection of 1 cc of Depo-Medrol 40 mg with several cc's of plain xylocaine. Anesthesia was provided by ethyl chloride and a 20-gauge needle was used to inject the knee area. The injection was tolerated well.  A band aid dressing was applied.  The patient was advised to apply ice later today and tomorrow to the injection sight as needed.  Electronically Signed Sanjuana Kava, MD 11/28/20172:18 PM

## 2016-12-04 ENCOUNTER — Ambulatory Visit: Payer: Medicaid Other | Admitting: Orthopaedic Surgery

## 2016-12-26 ENCOUNTER — Emergency Department (HOSPITAL_COMMUNITY): Payer: Medicaid Other

## 2016-12-26 ENCOUNTER — Inpatient Hospital Stay (HOSPITAL_COMMUNITY)
Admission: EM | Admit: 2016-12-26 | Discharge: 2016-12-30 | DRG: 974 | Disposition: A | Discharge status: Home / Self Care | Payer: Medicaid Other | Attending: Internal Medicine | Admitting: Internal Medicine

## 2016-12-26 ENCOUNTER — Encounter (HOSPITAL_COMMUNITY): Payer: Self-pay | Admitting: *Deleted

## 2016-12-26 DIAGNOSIS — Z21 Asymptomatic human immunodeficiency virus [HIV] infection status: Secondary | ICD-10-CM | POA: Diagnosis present

## 2016-12-26 DIAGNOSIS — J1000 Influenza due to other identified influenza virus with unspecified type of pneumonia: Secondary | ICD-10-CM | POA: Diagnosis present

## 2016-12-26 DIAGNOSIS — J181 Lobar pneumonia, unspecified organism: Secondary | ICD-10-CM | POA: Diagnosis not present

## 2016-12-26 DIAGNOSIS — I272 Pulmonary hypertension, unspecified: Secondary | ICD-10-CM | POA: Diagnosis present

## 2016-12-26 DIAGNOSIS — F209 Schizophrenia, unspecified: Secondary | ICD-10-CM | POA: Diagnosis not present

## 2016-12-26 DIAGNOSIS — Z888 Allergy status to other drugs, medicaments and biological substances status: Secondary | ICD-10-CM

## 2016-12-26 DIAGNOSIS — J189 Pneumonia, unspecified organism: Secondary | ICD-10-CM | POA: Diagnosis present

## 2016-12-26 DIAGNOSIS — E7800 Pure hypercholesterolemia, unspecified: Secondary | ICD-10-CM | POA: Diagnosis present

## 2016-12-26 DIAGNOSIS — E785 Hyperlipidemia, unspecified: Secondary | ICD-10-CM | POA: Diagnosis present

## 2016-12-26 DIAGNOSIS — B20 Human immunodeficiency virus [HIV] disease: Secondary | ICD-10-CM | POA: Diagnosis not present

## 2016-12-26 DIAGNOSIS — Z79891 Long term (current) use of opiate analgesic: Secondary | ICD-10-CM

## 2016-12-26 DIAGNOSIS — F70 Mild intellectual disabilities: Secondary | ICD-10-CM | POA: Diagnosis present

## 2016-12-26 DIAGNOSIS — A419 Sepsis, unspecified organism: Secondary | ICD-10-CM | POA: Diagnosis not present

## 2016-12-26 DIAGNOSIS — F609 Personality disorder, unspecified: Secondary | ICD-10-CM | POA: Diagnosis present

## 2016-12-26 DIAGNOSIS — F17200 Nicotine dependence, unspecified, uncomplicated: Secondary | ICD-10-CM | POA: Diagnosis not present

## 2016-12-26 DIAGNOSIS — J111 Influenza due to unidentified influenza virus with other respiratory manifestations: Secondary | ICD-10-CM | POA: Diagnosis not present

## 2016-12-26 DIAGNOSIS — F200 Paranoid schizophrenia: Secondary | ICD-10-CM | POA: Diagnosis present

## 2016-12-26 DIAGNOSIS — R000 Tachycardia, unspecified: Secondary | ICD-10-CM | POA: Diagnosis present

## 2016-12-26 DIAGNOSIS — Z79899 Other long term (current) drug therapy: Secondary | ICD-10-CM

## 2016-12-26 DIAGNOSIS — F17210 Nicotine dependence, cigarettes, uncomplicated: Secondary | ICD-10-CM | POA: Diagnosis present

## 2016-12-26 DIAGNOSIS — J101 Influenza due to other identified influenza virus with other respiratory manifestations: Secondary | ICD-10-CM | POA: Diagnosis present

## 2016-12-26 LAB — BASIC METABOLIC PANEL
ANION GAP: 9 (ref 5–15)
BUN: 13 mg/dL (ref 6–20)
CALCIUM: 9.3 mg/dL (ref 8.9–10.3)
CO2: 26 mmol/L (ref 22–32)
Chloride: 97 mmol/L — ABNORMAL LOW (ref 101–111)
Creatinine, Ser: 0.93 mg/dL (ref 0.44–1.00)
GFR calc non Af Amer: >60 mL/min (ref >60)
Glucose, Bld: 112 mg/dL — ABNORMAL HIGH (ref 65–99)
Potassium: 4 mmol/L (ref 3.5–5.1)
Sodium: 132 mmol/L — ABNORMAL LOW (ref 135–145)

## 2016-12-26 LAB — CBC WITH DIFFERENTIAL/PLATELET
Basophils Absolute: 0 K/uL (ref 0.0–0.1)
Basophils Relative: 0 %
Eosinophils Absolute: 0 K/uL (ref 0.0–0.7)
Eosinophils Relative: 0 %
HCT: 37.9 % (ref 36.0–46.0)
Hemoglobin: 12.8 g/dL (ref 12.0–15.0)
Lymphocytes Relative: 20 %
Lymphs Abs: 1.7 K/uL (ref 0.7–4.0)
MCH: 32.6 pg (ref 26.0–34.0)
MCHC: 33.8 g/dL (ref 30.0–36.0)
MCV: 96.4 fL (ref 78.0–100.0)
Monocytes Absolute: 1.2 K/uL — ABNORMAL HIGH (ref 0.1–1.0)
Monocytes Relative: 14 %
Neutro Abs: 5.7 K/uL (ref 1.7–7.7)
Neutrophils Relative %: 66 %
Platelets: 180 K/uL (ref 150–400)
RBC: 3.93 MIL/uL (ref 3.87–5.11)
RDW: 15.2 % (ref 11.5–15.5)
WBC: 8.6 K/uL (ref 4.0–10.5)

## 2016-12-26 LAB — INFLUENZA PANEL BY PCR (TYPE A & B)
INFLAPCR: POSITIVE — AB
Influenza B By PCR: NEGATIVE

## 2016-12-26 LAB — PROTIME-INR
INR: 1.02
PROTHROMBIN TIME: 13.4 s (ref 11.4–15.2)

## 2016-12-26 LAB — LACTIC ACID, PLASMA
LACTIC ACID, VENOUS: 2.1 mmol/L — AB (ref 0.5–1.9)
Lactic Acid, Venous: 2.1 mmol/L (ref 0.5–1.9)
Lactic Acid, Venous: 2.8 mmol/L (ref 0.5–1.9)

## 2016-12-26 LAB — APTT: APTT: 25 s (ref 24–36)

## 2016-12-26 LAB — PROCALCITONIN

## 2016-12-26 MED ORDER — AZITHROMYCIN 250 MG PO TABS
500.0000 mg | ORAL_TABLET | Freq: Once | ORAL | Status: AC
  Administered 2016-12-26: 500 mg via ORAL
  Filled 2016-12-26: qty 2

## 2016-12-26 MED ORDER — IBUPROFEN 800 MG PO TABS
800.0000 mg | ORAL_TABLET | Freq: Once | ORAL | Status: AC
  Administered 2016-12-26: 800 mg via ORAL

## 2016-12-26 MED ORDER — ACETAMINOPHEN 325 MG PO TABS
ORAL_TABLET | ORAL | Status: AC
Start: 2016-12-26 — End: 2016-12-27
  Filled 2016-12-26: qty 2

## 2016-12-26 MED ORDER — ENOXAPARIN SODIUM 40 MG/0.4ML ~~LOC~~ SOLN
40.0000 mg | SUBCUTANEOUS | Status: DC
  Administered 2016-12-26 – 2016-12-29 (×4): 40 mg via SUBCUTANEOUS
  Filled 2016-12-26 (×4): qty 0.4

## 2016-12-26 MED ORDER — DEXTROSE 5 % IV SOLN
500.0000 mg | INTRAVENOUS | Status: DC
  Administered 2016-12-27 – 2016-12-29 (×3): 500 mg via INTRAVENOUS
  Filled 2016-12-26 (×4): qty 500

## 2016-12-26 MED ORDER — SODIUM CHLORIDE 0.9 % IV BOLUS (SEPSIS)
1000.0000 mL | Freq: Once | INTRAVENOUS | Status: AC
  Administered 2016-12-26: 1000 mL via INTRAVENOUS

## 2016-12-26 MED ORDER — OSELTAMIVIR PHOSPHATE 75 MG PO CAPS: 75.0000 mg | ORAL_CAPSULE | Freq: Two times a day (BID) | ORAL | 0 refills | Status: DC

## 2016-12-26 MED ORDER — VALACYCLOVIR HCL 500 MG PO TABS
1000.0000 mg | ORAL_TABLET | Freq: Every day | ORAL | Status: DC
  Administered 2016-12-27 – 2016-12-30 (×4): 1000 mg via ORAL
  Filled 2016-12-26 (×6): qty 2

## 2016-12-26 MED ORDER — OSELTAMIVIR PHOSPHATE 75 MG PO CAPS
75.0000 mg | ORAL_CAPSULE | Freq: Once | ORAL | Status: AC
  Administered 2016-12-26: 75 mg via ORAL
  Filled 2016-12-26: qty 1

## 2016-12-26 MED ORDER — QUETIAPINE FUMARATE 100 MG PO TABS
200.0000 mg | ORAL_TABLET | ORAL | Status: DC
  Administered 2016-12-26 – 2016-12-29 (×7): 200 mg via ORAL
  Filled 2016-12-26 (×7): qty 2

## 2016-12-26 MED ORDER — ABACAVIR SULFATE-LAMIVUDINE 600-300 MG PO TABS
1.0000 | ORAL_TABLET | Freq: Every day | ORAL | Status: DC
  Filled 2016-12-26 (×4): qty 1

## 2016-12-26 MED ORDER — SODIUM CHLORIDE 0.9 % IV SOLN
INTRAVENOUS | Status: DC
  Administered 2016-12-26 – 2016-12-28 (×5): via INTRAVENOUS

## 2016-12-26 MED ORDER — NICOTINE 14 MG/24HR TD PT24
14.0000 mg | MEDICATED_PATCH | Freq: Every day | TRANSDERMAL | Status: DC
  Administered 2016-12-26 – 2016-12-30 (×5): 14 mg via TRANSDERMAL
  Filled 2016-12-26 (×5): qty 1

## 2016-12-26 MED ORDER — SODIUM CHLORIDE 0.9 % IV BOLUS (SEPSIS)
500.0000 mL | Freq: Once | INTRAVENOUS | Status: AC
  Administered 2016-12-26: 500 mL via INTRAVENOUS

## 2016-12-26 MED ORDER — ALBUTEROL SULFATE (2.5 MG/3ML) 0.083% IN NEBU
2.5000 mg | INHALATION_SOLUTION | RESPIRATORY_TRACT | Status: DC | PRN
  Administered 2016-12-26: 2.5 mg via RESPIRATORY_TRACT
  Filled 2016-12-26: qty 3

## 2016-12-26 MED ORDER — DEXTROSE 5 % IV SOLN: 1.0000 g | Freq: Once | INTRAVENOUS | Status: DC

## 2016-12-26 MED ORDER — ALBUTEROL SULFATE (2.5 MG/3ML) 0.083% IN NEBU
2.5000 mg | INHALATION_SOLUTION | Freq: Once | RESPIRATORY_TRACT | Status: AC
  Administered 2016-12-26: 2.5 mg via RESPIRATORY_TRACT
  Filled 2016-12-26: qty 3

## 2016-12-26 MED ORDER — ACETAMINOPHEN 325 MG PO TABS
650.0000 mg | ORAL_TABLET | Freq: Once | ORAL | Status: AC
  Administered 2016-12-26: 650 mg via ORAL

## 2016-12-26 MED ORDER — ATAZANAVIR SULFATE 200 MG PO CAPS
400.0000 mg | ORAL_CAPSULE | Freq: Every day | ORAL | Status: DC
  Administered 2016-12-28 – 2016-12-30 (×3): 400 mg via ORAL
  Filled 2016-12-26 (×6): qty 2

## 2016-12-26 MED ORDER — OXYCODONE-ACETAMINOPHEN 5-325 MG PO TABS
1.0000 | ORAL_TABLET | ORAL | Status: DC | PRN
  Administered 2016-12-26 – 2016-12-29 (×2): 1 via ORAL
  Filled 2016-12-26 (×2): qty 1

## 2016-12-26 MED ORDER — OSELTAMIVIR PHOSPHATE 75 MG PO CAPS
75.0000 mg | ORAL_CAPSULE | Freq: Two times a day (BID) | ORAL | Status: DC
  Administered 2016-12-27 – 2016-12-29 (×6): 75 mg via ORAL
  Filled 2016-12-26 (×7): qty 1

## 2016-12-26 MED ORDER — DIVALPROEX SODIUM 250 MG PO DR TAB
500.0000 mg | DELAYED_RELEASE_TABLET | ORAL | Status: DC
  Administered 2016-12-27 – 2016-12-29 (×6): 500 mg via ORAL
  Filled 2016-12-26 (×6): qty 2

## 2016-12-26 MED ORDER — DEXTROSE 5 % IV SOLN
1.0000 g | Freq: Once | INTRAVENOUS | Status: AC
  Administered 2016-12-26: 1 g via INTRAVENOUS
  Filled 2016-12-26: qty 10

## 2016-12-26 MED ORDER — DEXTROSE 5 % IV SOLN
1.0000 g | INTRAVENOUS | Status: DC
  Administered 2016-12-27 – 2016-12-29 (×3): 1 g via INTRAVENOUS
  Filled 2016-12-26 (×4): qty 10

## 2016-12-26 MED ORDER — ATORVASTATIN CALCIUM 10 MG PO TABS
10.0000 mg | ORAL_TABLET | Freq: Two times a day (BID) | ORAL | Status: DC
  Administered 2016-12-26 – 2016-12-30 (×8): 10 mg via ORAL
  Filled 2016-12-26 (×8): qty 1

## 2016-12-26 MED ORDER — ZOLPIDEM TARTRATE 5 MG PO TABS
5.0000 mg | ORAL_TABLET | Freq: Every day | ORAL | Status: DC
  Administered 2016-12-26 – 2016-12-29 (×4): 5 mg via ORAL
  Filled 2016-12-26 (×4): qty 1

## 2016-12-26 MED ORDER — IPRATROPIUM-ALBUTEROL 0.5-2.5 (3) MG/3ML IN SOLN
3.0000 mL | Freq: Once | RESPIRATORY_TRACT | Status: AC
  Administered 2016-12-26: 3 mL via RESPIRATORY_TRACT
  Filled 2016-12-26: qty 3

## 2016-12-26 MED ORDER — HALOPERIDOL 5 MG PO TABS
20.0000 mg | ORAL_TABLET | Freq: Two times a day (BID) | ORAL | Status: DC
Start: 2016-12-26 — End: 2016-12-30
  Administered 2016-12-26 – 2016-12-29 (×7): 20 mg via ORAL
  Filled 2016-12-26 (×7): qty 4

## 2016-12-26 MED ORDER — DEXTROSE 5 % IV SOLN: 500.0000 mg | Freq: Once | INTRAVENOUS | Status: DC

## 2016-12-26 MED ORDER — AMANTADINE HCL 100 MG PO CAPS
ORAL_CAPSULE | ORAL | Status: AC
  Filled 2016-12-26: qty 1

## 2016-12-26 MED ORDER — RALTEGRAVIR POTASSIUM 400 MG PO TABS
400.0000 mg | ORAL_TABLET | Freq: Two times a day (BID) | ORAL | Status: DC
  Administered 2016-12-26 – 2016-12-30 (×8): 400 mg via ORAL
  Filled 2016-12-26 (×12): qty 1

## 2016-12-26 MED ORDER — AMANTADINE HCL 100 MG PO CAPS
100.0000 mg | ORAL_CAPSULE | Freq: Two times a day (BID) | ORAL | Status: DC
  Administered 2016-12-26 – 2016-12-30 (×8): 100 mg via ORAL
  Filled 2016-12-26 (×12): qty 1

## 2016-12-26 MED ORDER — ALBUTEROL SULFATE HFA 108 (90 BASE) MCG/ACT IN AERS
2.0000 | INHALATION_SPRAY | Freq: Once | RESPIRATORY_TRACT | Status: AC
  Administered 2016-12-26: 2 via RESPIRATORY_TRACT
  Filled 2016-12-26: qty 6.7

## 2016-12-26 MED ORDER — IBUPROFEN 800 MG PO TABS
ORAL_TABLET | ORAL | Status: AC
  Administered 2016-12-26: 800 mg via ORAL
  Filled 2016-12-26: qty 1

## 2016-12-26 NOTE — H&P (Signed)
History and Physical    Karina Clarke GUR:[id number] DOB: 08-24-1961 DOA: 12/26/2016  PCP: Rosita Fire, MD Consultants:  Neurology in [place]; [place]; [place] NP [facility] Patient coming from: group home; NOK: Karina Clarke   Chief Complaint: flu-like illness  HPI: Karina Clarke is a 56 y.o. female with medical history significant of intellectual disability, schizophrenia, personality disorder, HIV, h/o polysubstance abuse, and HLD presenting with flu-like illness.  She is unaccompanied and so the history is limited.  She reports that she has been feeling sick since last night.  ?fever.  +cough, nonproductive.  Body aches.  No sick contacts.  + tobacco.  HIV followed at [facility].  Last appt 08/21/16.  Last CD4 1.03, last viral load undetectable.  ED Course: Per PA-C [place]: Pt initially well appearing. Flu A positive.  On recheck of pt's vitals, she is tachycardic and spiked fever. Will hydrate with po fluids and recheck. Albuterol neb ordered.  1615 After neb, pt sat's are decreasing, lung sounds improved, few inspiratory wheezes remain. Remains tachycardic, but improving. Labs and CXR ordered. Oral tamiflu given. Will consult hospitalist for admit.  [place] hospitalist, Dr. Lorin Mercy, who agrees to admit, tele bed. Requests to give Rocephin and zithromax.   Review of Systems: As per HPI; otherwise 10 point review of systems reviewed and negative.   Ambulatory Status:  Uses a cane sometimes  Past Medical History:  Diagnosis Date  . 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 Bloating    per sister  . Chronic right hip pain    sister states circulation problem in hip  . Family history of adverse reaction to anesthesia    sister has hx. of post-op N/V  . High cholesterol   . HIV (human immunodeficiency virus infection) ([place])   . Limp   . Mild intellectual disability   . No natural teeth    does not wear her dentures  . Paranoid schizophrenia ([place])    . Personality disorder   . Schizophrenia [facility])     Past Surgical History:  Procedure Laterality Date  . BREAST LUMPECTOMY WITH NEEDLE LOCALIZATION Left 07/28/2015   Procedure: BREAST LUMPECTOMY WITH NEEDLE LOCALIZATION;  Surgeon: Autumn Messing III, MD;  Location: [place];  Service: General;  Laterality: Left;  . COLONOSCOPY WITH PROPOFOL N/A 06/13/2015   SLF: 1. the left colon is redundant 2. the examination was otherwise normal 3. small internal hemorrhoids 4. moderate sized external hemorrhoids  . HEMORRHOID SURGERY    . UMBILICAL HERNIA REPAIR      Social History   Social History  . Marital status: Single    Spouse name: N/A  . Number of children: N/A  . Years of education: N/A   Occupational History  . disabled    Social History Main Topics  . Smoking status: Current Every Day Smoker    Packs/day: 1.00    Years: 18.00    Types: Cigarettes  . Smokeless tobacco: Never Used  . Alcohol use No  . Drug use: No  . Sexual activity: No   Other Topics Concern  . Not on file   Social History Narrative  . No narrative on file    Allergies  Allergen Reactions  . Asa [Aspirin] Nausea And Vomiting    Family History  Problem Relation Age of Onset  . Anesthesia problems Sister     post-op N/V    Prior to Admission medications   Medication Sig Start Date End Date  Taking? Authorizing Provider  abacavir-lamiVUDine (EPZICOM) 600-300 MG per tablet Take 1 tablet by mouth daily.   Yes Historical Provider, MD  amantadine (SYMMETREL) 100 MG capsule Take 100 mg by mouth 2 (two) times daily.   Yes Historical Provider, MD  atazanavir (REYATAZ) 200 MG capsule Take 400 mg by mouth daily.    Yes Historical Provider, MD  atorvastatin (LIPITOR) 10 MG tablet Take 10 mg by mouth 2 (two) times daily.   Yes Historical Provider, MD  divalproex (DEPAKOTE) 500 MG DR tablet Take 500 mg by mouth See admin instructions. Take 1 tablet (500 mg) by mouth at 4pm and 8pm daily   Yes  Historical Provider, MD  haloperidol (HALDOL) 10 MG tablet Take 20 mg by mouth 2 (two) times daily.   Yes Historical Provider, MD  haloperidol decanoate (HALDOL DECANOATE) 100 MG/ML injection Inject 100 mg into the muscle every 28 (twenty-eight) days. Last injection 06/09/15   Yes Historical Provider, MD  oxyCODONE-acetaminophen (ROXICET) 5-325 MG per tablet Take 1-2 tablets by mouth every 4 (four) hours as needed. 07/28/15  Yes Autumn Messing III, MD  QUEtiapine (SEROQUEL) 200 MG tablet Take 200 mg by mouth See admin instructions. Take 1 tablet (200 mg) by mouth with supper and at bedtime daily   Yes Historical Provider, MD  raltegravir (ISENTRESS) 400 MG tablet Take 400 mg by mouth 2 (two) times daily.   Yes Historical Provider, MD  valACYclovir (VALTREX) 1000 MG tablet Take 1,000 mg by mouth daily.    Yes Historical Provider, MD  zolpidem (AMBIEN) 10 MG tablet Take 5 mg by mouth at bedtime.    Yes Historical Provider, MD  oseltamivir (TAMIFLU) 75 MG capsule Take 1 capsule (75 mg total) by mouth every 12 (twelve) hours. 12/26/16   Kem Parkinson, PA-C    Physical Exam: Vitals:   12/26/16 1552 12/26/16 1710 12/26/16 1715 12/26/16 1800  BP:  126/79 112/73 114/82  Pulse:  109 111 106  Resp:  18 20 20   Temp:      TempSrc:      SpO2: 90% 97% 91% 94%  Weight:      Height:         General: Appears calm and comfortable and is NAD Eyes:  PERRL, EOMI, normal lids, iris, mild conjunctival injection bilaterally ENT:  grossly normal hearing, lips & tongue, mmm Neck:  no LAD, masses or thyromegaly Cardiovascular:  RRR, 4-3/3 systolic murmur, no r/g. No LE edema.  Respiratory: RLL rhonchi, otherwise CTA.  Normal respiratory effort. Abdomen:  soft, ntnd, NABS Skin:  no rash or induration seen on limited exam Musculoskeletal:  grossly normal tone BUE/BLE, good ROM, no bony abnormality Psychiatric:  Intellectual disability present, but patient attempted to provide majority of history and to  ccoperate Neurologic:  CN 2-12 grossly intact, moves all extremities in coordinated fashion, sensation intact  Labs on Admission: I have personally reviewed following labs and imaging studies  CBC:  Recent Labs Lab 12/26/16 1650  WBC 8.6  NEUTROABS 5.7  HGB 12.8  HCT 37.9  MCV 96.4  PLT 295   Basic Metabolic Panel:  Recent Labs Lab 12/26/16 1650  NA 132*  K 4.0  CL 97*  CO2 26  GLUCOSE 112*  BUN 13  CREATININE 0.93  CALCIUM 9.3   GFR: Estimated Creatinine Clearance: 97.7 mL/min (by C-G formula based on SCr of 0.93 mg/dL). Liver Function Tests: No results for input(s): AST, ALT, ALKPHOS, BILITOT, PROT, ALBUMIN in the last 168 hours. No results for  input(s): LIPASE, AMYLASE in the last 168 hours. No results for input(s): AMMONIA in the last 168 hours. Coagulation Profile: No results for input(s): INR, PROTIME in the last 168 hours. Cardiac Enzymes: No results for input(s): CKTOTAL, CKMB, CKMBINDEX, TROPONINI in the last 168 hours. BNP (last 3 results) No results for input(s): PROBNP in the last 8760 hours. HbA1C: No results for input(s): HGBA1C in the last 72 hours. CBG: No results for input(s): GLUCAP in the last 168 hours. Lipid Profile: No results for input(s): CHOL, HDL, LDLCALC, TRIG, CHOLHDL, LDLDIRECT in the last 72 hours. Thyroid Function Tests: No results for input(s): TSH, T4TOTAL, FREET4, T3FREE, THYROIDAB in the last 72 hours. Anemia Panel: No results for input(s): VITAMINB12, FOLATE, FERRITIN, TIBC, IRON, RETICCTPCT in the last 72 hours. Urine analysis: No results found for: COLORURINE, APPEARANCEUR, LABSPEC, PHURINE, GLUCOSEU, HGBUR, BILIRUBINUR, KETONESUR, PROTEINUR, UROBILINOGEN, NITRITE, LEUKOCYTESUR  Creatinine Clearance: Estimated Creatinine Clearance: 97.7 mL/min (by C-G formula based on SCr of 0.93 mg/dL).  Sepsis Labs: @LABRCNTIP (procalcitonin:4,lacticidven:4) )No results found for this or any previous visit (from the past 240 hour(s)).    Radiological Exams on Admission: Dg Chest Portable 1 View  Result Date: 12/26/2016 CLINICAL DATA:  Acute onset of fever, productive cough, congestion and body aches. Mild right-sided chest pain. Initial encounter. EXAM: PORTABLE CHEST 1 VIEW COMPARISON:  Chest radiograph performed 06/22/2013, and report from CTA of the chest performed 09/07/2011 FINDINGS: The lungs are well-aerated. Vascular congestion is noted. Increased prominence of an apparent vascular structure at the right lower lung zone, measuring nearly 3 cm in diameter, likely reflects worsening pulmonary hypertension, given the patient's history per prior CTA report. Mild haziness at the right lung base could reflect mild infection. There is no evidence of pleural effusion or pneumothorax. The cardiomediastinal silhouette is borderline normal in size. No acute osseous abnormalities are seen. IMPRESSION: 1. Mild haziness at the right lung base could reflect mild infection. 2. Vascular congestion noted. Increased prominence of an apparent vascular structure at the right lower lung zone, measuring nearly 3 cm in diameter, likely reflects worsening pulmonary hypertension, given the patient's history per prior CTA report. Would correlate with the patient's symptoms. Electronically Signed   By: Garald Balding M.D.   On: 12/26/2016 16:40    EKG: not done  Assessment/Plan Principal Problem:   Sepsis ([place]) Active Problems:   Influenza   CAP (community acquired pneumonia)   Tobacco use disorder   HIV (human immunodeficiency virus infection) ([place])   Schizophrenia ([place])   Sepsis resulting from Influenza A and CAP -Fever, tachycardia with elevated lactate to 2.1 -Sepsis protocol initiated in the ER -Patient is Influenza A positive - will treat with Tamiflu -Also with apparent CAP -Will start  Azithromycin 500 mg daily x 5 days AND Rocephin. -NS @ 75cc/hr - Fever control - Repeat CBC in am - Blood cultures - albuterol PRN -Strep antigen  testing -Blood and urine cultures pending -Will place in observation status and continue to monitor -Will trend lactate to ensure improvement  Tobacco -Encourage cessation.  This was discussed with the patient and should be reviewed on an ongoing basis.   -Patch ordered.  HIV -Undetectable viral load at last checl -Followed at [place] home meds: Epzicom, Atazanavir, Raltegravir  Schizophrenia with ID -Continue home meds: Amantadine (note, this can also be used for flu prevention>treatment), Depakote, Haldol, Seroquel -I have reviewed this patient in the [place] Controlled Substances Reporting System.  She is receiving only Ambien from only one provider over the  last 6 months.  She has not filled a prescription for Roxicet.    DVT prophylaxis: Lovenox Code Status:  Full - confirmed with patient Family Communication: None present  Disposition Plan:  Home once clinically improved Consults called: None  Admission status: It is my clinical opinion that referral for OBSERVATION is reasonable and necessary in this patient based on the above information provided. The aforementioned taken together are felt to place the patient at high risk for further clinical deterioration. However it is anticipated that the patient may be medically stable for discharge from the hospital within 24 to 48 hours.     Karmen Bongo MD [facility]  If 7PM-7AM, please contact night-coverage [email or web address] Password [facility]  12/26/2016, 6:42 PM

## 2016-12-26 NOTE — Progress Notes (Addendum)
Phoned Dr. Lorin Mercy about critical lab, and she states that patient just finished boluses of 3.5 liters in ED and that the labs may have been drawn before boluses were completed. She wants to continue current orders and treatment as is. Will continue to monitor patient.

## 2016-12-26 NOTE — Progress Notes (Signed)
CRITICAL VALUE ALERT  Critical value received:  Lactic Acid 2.8  Date of notification:  12/26/2016  Time of notification:  21:12  Critical value read back: yes  Nurse who received alert:  Stephannie Peters  MD notified (1st page):  Dr. Lorin Mercy  Time of first page:  21:14  MD notified (2nd page):  Time of second page:  Responding MD:  Dr. Lorin Mercy  Time MD responded:  21:15

## 2016-12-26 NOTE — Progress Notes (Signed)
CRITICAL VALUE ALERT  Critical value received:  Lactic Acid 2.1  Date of notification:  12/26/2016  Time of notification:  23:10  Critical value read back: yes  Nurse who received alert:  Stephannie Peters, RN  MD notified (1st page):  Baltazar Najjar, NP  Time of first page:  23:11  MD notified (2nd page):   Time of second page:  Responding MD:  Baltazar Najjar, NP  Time MD responded:  23:15

## 2016-12-26 NOTE — Discharge Instructions (Signed)
Tylenol every 4 hrs for fever and body aches.  Follow-up with your doctor for recheck if not improving

## 2016-12-26 NOTE — ED Triage Notes (Signed)
Pt comes in with cough, congestion, body aches starting yesterday. Denies any n/v/d.

## 2016-12-26 NOTE — ED Provider Notes (Signed)
[place] DEPT Provider Note   CSN: [id number] Arrival date & time: 12/26/16  1155     History   Chief Complaint Chief Complaint  Patient presents with  . Flu-like symptoms    HPI Karina Clarke is a 56 y.o. female.  HPI   Karina Clarke is a 56 y.o. female with hx of HIV, schizophrenia and who resides in a group home, presents to the Emergency Department complaining of cough, nasal congestion, generalized body aches, and chills. Symptoms began yesterday. Cough is non-productive and associated with chest tightness.  Patient's caregiver states that she has been outside smoking cigarettes frequently and not dressing appropriately for the colder weather. Caregiver also states that other group home members have not had similar symptoms.  Patient has not taken any anti-pyretics prior to arrival.      Past Medical History:  Diagnosis Date  . 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 Bloating    per sister  . Chronic right hip pain    sister states circulation problem in hip  . Family history of adverse reaction to anesthesia    sister has hx. of post-op N/V  . High cholesterol   . HIV (human immunodeficiency virus infection) ([place])   . Limp   . Mild intellectual disability   . No natural teeth    does not wear her dentures  . Paranoid schizophrenia ([place])   . Personality disorder   . Schizophrenia [facility])     Patient Active Problem List   Diagnosis Date Noted  . Personality disorder 08/22/2016  . Encounter for screening colonoscopy 05/24/2015    Past Surgical History:  Procedure Laterality Date  . BREAST LUMPECTOMY WITH NEEDLE LOCALIZATION Left 07/28/2015   Procedure: BREAST LUMPECTOMY WITH NEEDLE LOCALIZATION;  Surgeon: Autumn Messing III, MD;  Location: [place];  Service: General;  Laterality: Left;  . COLONOSCOPY WITH PROPOFOL N/A 06/13/2015   SLF: 1. the left colon is redundant 2. the examination was otherwise normal 3. small internal  hemorrhoids 4. moderate sized external hemorrhoids  . HEMORRHOID SURGERY    . UMBILICAL HERNIA REPAIR      OB History    No data available       Home Medications    Prior to Admission medications   Medication Sig Start Date End Date Taking? Authorizing Provider  abacavir-lamiVUDine (EPZICOM) 600-300 MG per tablet Take 1 tablet by mouth daily.   Yes Historical Provider, MD  amantadine (SYMMETREL) 100 MG capsule Take 100 mg by mouth 2 (two) times daily.   Yes Historical Provider, MD  atazanavir (REYATAZ) 200 MG capsule Take 400 mg by mouth daily.    Yes Historical Provider, MD  atorvastatin (LIPITOR) 10 MG tablet Take 10 mg by mouth 2 (two) times daily.   Yes Historical Provider, MD  divalproex (DEPAKOTE) 500 MG DR tablet Take 500 mg by mouth See admin instructions. Take 1 tablet (500 mg) by mouth at 4pm and 8pm daily   Yes Historical Provider, MD  haloperidol (HALDOL) 10 MG tablet Take 20 mg by mouth 2 (two) times daily.   Yes Historical Provider, MD  haloperidol decanoate (HALDOL DECANOATE) 100 MG/ML injection Inject 100 mg into the muscle every 28 (twenty-eight) days. Last injection 06/09/15   Yes Historical Provider, MD  oxyCODONE-acetaminophen (ROXICET) 5-325 MG per tablet Take 1-2 tablets by mouth every 4 (four) hours as needed. 07/28/15  Yes Autumn Messing III, MD  QUEtiapine (SEROQUEL) 200 MG tablet Take 200 mg by mouth See  admin instructions. Take 1 tablet (200 mg) by mouth with supper and at bedtime daily   Yes Historical Provider, MD  raltegravir (ISENTRESS) 400 MG tablet Take 400 mg by mouth 2 (two) times daily.   Yes Historical Provider, MD  valACYclovir (VALTREX) 1000 MG tablet Take 1,000 mg by mouth daily.    Yes Historical Provider, MD  zolpidem (AMBIEN) 10 MG tablet Take 5 mg by mouth at bedtime.    Yes Historical Provider, MD  oseltamivir (TAMIFLU) 75 MG capsule Take 1 capsule (75 mg total) by mouth every 12 (twelve) hours. 12/26/16   Marlowe Cinquemani, PA-C    Family History Family  History  Problem Relation Age of Onset  . Anesthesia problems Sister     post-op N/V    Social History Social History  Substance Use Topics  . Smoking status: Former Smoker    Packs/day: 1.00    Years: 18.00    Types: Cigarettes  . Smokeless tobacco: Never Used  . Alcohol use No     Allergies   Asa [aspirin]   Review of Systems Review of Systems  Constitutional: Positive for chills and fever. Negative for activity change and appetite change.  HENT: Positive for congestion, rhinorrhea and sore throat. Negative for facial swelling and trouble swallowing.   Eyes: Negative for visual disturbance.  Respiratory: Positive for cough and chest tightness. Negative for shortness of breath, wheezing and stridor.   Cardiovascular: Negative for chest pain.  Gastrointestinal: Negative for abdominal pain, nausea and vomiting.  Genitourinary: Negative for dysuria, flank pain and urgency.  Musculoskeletal: Positive for myalgias. Negative for neck pain and neck stiffness.  Skin: Negative.   Neurological: Negative for dizziness, weakness, numbness and headaches.  Hematological: Negative for adenopathy.  Psychiatric/Behavioral: Negative for confusion.  All other systems reviewed and are negative.    Physical Exam Updated Vital Signs BP 131/66   Pulse (!) 121   Temp 102.7 F (39.3 C) (Oral)   Resp 20   Ht 6' (1.829 m)   Wt 111.6 kg   SpO2 92%   BMI 33.36 kg/m   Physical Exam  Constitutional: She is oriented to person, place, and time. She appears well-developed and well-nourished. No distress.  HENT:  Head: Normocephalic and atraumatic.  Right Ear: Tympanic membrane and ear canal normal.  Left Ear: Tympanic membrane and ear canal normal.  Nose: Rhinorrhea present. No mucosal edema.  Mouth/Throat: Uvula is midline and mucous membranes are normal. No trismus in the jaw. No uvula swelling. Posterior oropharyngeal erythema present. No oropharyngeal exudate, posterior oropharyngeal  edema or tonsillar abscesses.  Eyes: Conjunctivae are normal.  Neck: Normal range of motion and phonation normal. Neck supple. No Brudzinski's sign and no Kernig's sign noted.  Cardiovascular: Normal rate, regular rhythm, normal heart sounds and intact distal pulses.   No murmur heard. Pulmonary/Chest: Effort normal and breath sounds normal. No respiratory distress. She has no wheezes. She has no rales.  Active cough, slightly diminished lung sounds bilaterally  Abdominal: Soft. She exhibits no distension. There is no tenderness. There is no rebound and no guarding.  Musculoskeletal: Normal range of motion. She exhibits no edema.  Lymphadenopathy:    She has no cervical adenopathy.  Neurological: She is alert and oriented to person, place, and time. She exhibits normal muscle tone. Coordination normal.  Skin: Skin is warm and dry. No rash noted.  Nursing note and vitals reviewed.    ED Treatments / Results  Labs (all labs ordered are listed, but only  abnormal results are displayed) Labs Reviewed  INFLUENZA PANEL BY PCR (TYPE A & B) - Abnormal; Notable for the following:       Result Value   Influenza A By PCR POSITIVE (*)    All other components within normal limits  BASIC METABOLIC PANEL  CBC WITH DIFFERENTIAL/PLATELET  LACTIC ACID, PLASMA  LACTIC ACID, PLASMA    EKG  EKG Interpretation None       Radiology Dg Chest Portable 1 View  Result Date: 12/26/2016 CLINICAL DATA:  Acute onset of fever, productive cough, congestion and body aches. Mild right-sided chest pain. Initial encounter. EXAM: PORTABLE CHEST 1 VIEW COMPARISON:  Chest radiograph performed 06/22/2013, and report from CTA of the chest performed 09/07/2011 FINDINGS: The lungs are well-aerated. Vascular congestion is noted. Increased prominence of an apparent vascular structure at the right lower lung zone, measuring nearly 3 cm in diameter, likely reflects worsening pulmonary hypertension, given the patient's  history per prior CTA report. Mild haziness at the right lung base could reflect mild infection. There is no evidence of pleural effusion or pneumothorax. The cardiomediastinal silhouette is borderline normal in size. No acute osseous abnormalities are seen. IMPRESSION: 1. Mild haziness at the right lung base could reflect mild infection. 2. Vascular congestion noted. Increased prominence of an apparent vascular structure at the right lower lung zone, measuring nearly 3 cm in diameter, likely reflects worsening pulmonary hypertension, given the patient's history per prior CTA report. Would correlate with the patient's symptoms. Electronically Signed   By: Garald Balding M.D.   On: 12/26/2016 16:40    Procedures Procedures (including critical care time)  Medications Ordered in ED Medications  azithromycin (ZITHROMAX) tablet 500 mg (not administered)  cefTRIAXone (ROCEPHIN) 1 g in dextrose 5 % 50 mL IVPB (not administered)  acetaminophen (TYLENOL) tablet 650 mg (650 mg Oral Given 12/26/16 1214)  ibuprofen (ADVIL,MOTRIN) tablet 800 mg (800 mg Oral Given 12/26/16 1404)  ipratropium-albuterol (DUONEB) 0.5-2.5 (3) MG/3ML nebulizer solution 3 mL (3 mLs Nebulization Given 12/26/16 1550)  albuterol (PROVENTIL) (2.5 MG/3ML) 0.083% nebulizer solution 2.5 mg (2.5 mg Nebulization Given 12/26/16 1550)  albuterol (PROVENTIL HFA;VENTOLIN HFA) 108 (90 Base) MCG/ACT inhaler 2 puff (2 puffs Inhalation Given 12/26/16 1604)  oseltamivir (TAMIFLU) capsule 75 mg (75 mg Oral Given 12/26/16 1615)     Initial Impression / Assessment and Plan / ED Course  I have reviewed the triage vital signs and the nursing notes.  Pertinent labs & imaging results that were available during my care of the patient were reviewed by me and considered in my medical decision making (see chart for details).    Pt initially well appearing.  Flu A positive.    On recheck of pt's vitals, she is tachycardic and spiked fever.  Will hydrate with po  fluids and recheck.  Albuterol neb ordered.   1615  After neb, pt sat's are decreasing, lung sounds improved, few inspiratory wheezes remain.  Remains tachycardic, but improving.  Labs and CXR ordered.  Oral tamiflu given.  Will consult hospitalist for admit.    [place] hospitalist, Dr. Lorin Mercy, who agrees to admit, tele bed.  Requests to give Rocephin and zithromax.  Final Clinical Impressions(s) / ED Diagnoses   Final diagnoses:  Influenza    New Prescriptions New Prescriptions   OSELTAMIVIR (TAMIFLU) 75 MG CAPSULE    Take 1 capsule (75 mg total) by mouth every 12 (twelve) hours.     Kem Parkinson, PA-C 12/26/16 1737    Vira Agar  Eulis Foster, MD 12/26/16 2016

## 2016-12-26 NOTE — ED Notes (Signed)
Pt placed on 2L 02 [place] for 02 sats 86 per respiratory.  Pt now 97% on 2L 02 [place].

## 2016-12-26 NOTE — Progress Notes (Signed)
Pharmacy Antibiotic Note  Karina Clarke is a 56 y.o. female admitted on 12/26/2016 with Pneumonia.  Pharmacy has been consulted for Ceftriaxone and azithromycin dosing.  Plan: Ceftriaxone 1gm IV q24h Azithromycin 500mg  IV q24h F/U cxs and clinical progress  Height: 6' (182.9 cm) Weight: 246 lb (111.6 kg) IBW/kg (Calculated) : 73.1  Temp (24hrs), Avg:101 F (38.3 C), Min:98.1 F (36.7 C), Max:103.1 F (39.5 C)   Recent Labs Lab 12/26/16 1650  WBC 8.6  CREATININE 0.93  LATICACIDVEN 2.1*    Estimated Creatinine Clearance: 97.7 mL/min (by C-G formula based on SCr of 0.93 mg/dL).    Allergies  Allergen Reactions  . Asa [Aspirin] Nausea And Vomiting    Antimicrobials this admission: Ceftriaxone 2/1 >>  Azithromycin 2/1 >>   Microbiology results: 2/1 BCx: pending 2/1 UCx: pending 2/1 influenza A+  Thank you for allowing pharmacy to be a part of this patient's care.  Isac Sarna, BS [place], [place] Clinical Pharmacist Pager [phone]  12/26/2016 9:08 PM

## 2016-12-27 DIAGNOSIS — E7800 Pure hypercholesterolemia, unspecified: Secondary | ICD-10-CM | POA: Diagnosis present

## 2016-12-27 DIAGNOSIS — F200 Paranoid schizophrenia: Secondary | ICD-10-CM | POA: Diagnosis present

## 2016-12-27 DIAGNOSIS — J111 Influenza due to unidentified influenza virus with other respiratory manifestations: Secondary | ICD-10-CM | POA: Diagnosis present

## 2016-12-27 DIAGNOSIS — F609 Personality disorder, unspecified: Secondary | ICD-10-CM | POA: Diagnosis present

## 2016-12-27 DIAGNOSIS — Z888 Allergy status to other drugs, medicaments and biological substances status: Secondary | ICD-10-CM | POA: Diagnosis not present

## 2016-12-27 DIAGNOSIS — R000 Tachycardia, unspecified: Secondary | ICD-10-CM | POA: Diagnosis present

## 2016-12-27 DIAGNOSIS — Z79899 Other long term (current) drug therapy: Secondary | ICD-10-CM | POA: Diagnosis not present

## 2016-12-27 DIAGNOSIS — J1000 Influenza due to other identified influenza virus with unspecified type of pneumonia: Secondary | ICD-10-CM | POA: Diagnosis present

## 2016-12-27 DIAGNOSIS — A419 Sepsis, unspecified organism: Secondary | ICD-10-CM | POA: Diagnosis present

## 2016-12-27 DIAGNOSIS — I272 Pulmonary hypertension, unspecified: Secondary | ICD-10-CM | POA: Diagnosis present

## 2016-12-27 DIAGNOSIS — B20 Human immunodeficiency virus [HIV] disease: Secondary | ICD-10-CM | POA: Diagnosis present

## 2016-12-27 DIAGNOSIS — E785 Hyperlipidemia, unspecified: Secondary | ICD-10-CM | POA: Diagnosis present

## 2016-12-27 DIAGNOSIS — F70 Mild intellectual disabilities: Secondary | ICD-10-CM | POA: Diagnosis present

## 2016-12-27 DIAGNOSIS — F17210 Nicotine dependence, cigarettes, uncomplicated: Secondary | ICD-10-CM | POA: Diagnosis present

## 2016-12-27 DIAGNOSIS — Z79891 Long term (current) use of opiate analgesic: Secondary | ICD-10-CM | POA: Diagnosis not present

## 2016-12-27 DIAGNOSIS — J101 Influenza due to other identified influenza virus with other respiratory manifestations: Secondary | ICD-10-CM | POA: Diagnosis present

## 2016-12-27 LAB — BASIC METABOLIC PANEL
ANION GAP: 11 (ref 5–15)
BUN: 10 mg/dL (ref 6–20)
CHLORIDE: 103 mmol/L (ref 101–111)
CO2: 26 mmol/L (ref 22–32)
Calcium: 8.9 mg/dL (ref 8.9–10.3)
Creatinine, Ser: 0.85 mg/dL (ref 0.44–1.00)
GFR calc Af Amer: >60 mL/min (ref >60)
GFR calc non Af Amer: >60 mL/min (ref >60)
GLUCOSE: 106 mg/dL — AB (ref 65–99)
Potassium: 4 mmol/L (ref 3.5–5.1)
Sodium: 140 mmol/L (ref 135–145)

## 2016-12-27 LAB — CBC WITH DIFFERENTIAL/PLATELET
Basophils Absolute: 0 10*3/uL (ref 0.0–0.1)
Basophils Relative: 0 %
Eosinophils Absolute: 0 10*3/uL (ref 0.0–0.7)
Eosinophils Relative: 0 %
HEMATOCRIT: 37.5 % (ref 36.0–46.0)
HEMOGLOBIN: 12.2 g/dL (ref 12.0–15.0)
LYMPHS ABS: 2.2 10*3/uL (ref 0.7–4.0)
Lymphocytes Relative: 31 %
MCH: 31.4 pg (ref 26.0–34.0)
MCHC: 32.5 g/dL (ref 30.0–36.0)
MCV: 96.6 fL (ref 78.0–100.0)
MONOS PCT: 13 %
Monocytes Absolute: 0.9 10*3/uL (ref 0.1–1.0)
NEUTROS ABS: 3.9 10*3/uL (ref 1.7–7.7)
NEUTROS PCT: 56 %
Platelets: 166 10*3/uL (ref 150–400)
RBC: 3.88 MIL/uL (ref 3.87–5.11)
RDW: 15.4 % (ref 11.5–15.5)
WBC: 7.1 10*3/uL (ref 4.0–10.5)

## 2016-12-27 LAB — LACTIC ACID, PLASMA: Lactic Acid, Venous: 2 mmol/L (ref 0.5–1.9)

## 2016-12-27 MED ORDER — LAMIVUDINE 150 MG PO TABS
300.0000 mg | ORAL_TABLET | Freq: Every day | ORAL | Status: DC
  Administered 2016-12-27 – 2016-12-30 (×4): 300 mg via ORAL
  Filled 2016-12-27 (×6): qty 2

## 2016-12-27 MED ORDER — ABACAVIR SULFATE 300 MG PO TABS
600.0000 mg | ORAL_TABLET | Freq: Every day | ORAL | Status: DC
  Administered 2016-12-28 – 2016-12-30 (×3): 600 mg via ORAL
  Filled 2016-12-27 (×6): qty 2

## 2016-12-27 MED ORDER — ACETAMINOPHEN 325 MG PO TABS
650.0000 mg | ORAL_TABLET | ORAL | Status: DC | PRN
  Administered 2016-12-27 – 2016-12-30 (×6): 650 mg via ORAL
  Filled 2016-12-27 (×7): qty 2

## 2016-12-27 NOTE — Progress Notes (Signed)
Subjective: Patient was admitted due to fever and chills. She is also complaining of muscle aching. Her lactic acid is elevated. Patient is started on combination IV antibiotics for possible pneumonia/sepsis. She is also on Tamiflu for influenza.  Objective: Vital signs in last 24 hours: Temp:  [98.1 F (36.7 C)-103.1 F (39.5 C)] 99.9 F (37.7 C) (02/01 2133) Pulse Rate:  [68-121] 101 (02/01 2133) Resp:  [18-24] 20 (02/01 2133) BP: (93-161)/(66-103) 93/70 (02/01 2133) SpO2:  [90 %-98 %] 98 % (02/01 2133) Weight:  [111 kg (244 lb 11.2 oz)-111.6 kg (246 lb)] 111 kg (244 lb 11.2 oz) (02/01 2133) Weight change:  Last BM Date: 12/26/16  Intake/Output from previous day: 02/01 0701 - 02/02 0700 In: 2808.8 [I.V.:758.8; IV Piggyback:2050] Out: -   PHYSICAL EXAM General appearance: alert, fatigued and no distress Resp: diminished breath sounds bilaterally and rhonchi bilaterally Cardio: S1, S2 normal GI: soft, non-tender; bowel sounds normal; no masses,  no organomegaly Extremities: extremities normal, atraumatic, no cyanosis or edema  Lab Results:  Results for orders placed or performed during the hospital encounter of 12/26/16 (from the past 48 hour(s))  Influenza panel by PCR (type A & B)     Status: Abnormal   Collection Time: 12/26/16 12:30 PM  Result Value Ref Range   Influenza A By PCR POSITIVE (A) NEGATIVE   Influenza B By PCR NEGATIVE NEGATIVE    Comment: (NOTE) The Xpert Xpress Flu assay is intended as an aid in the diagnosis of  influenza and should not be used as a sole basis for treatment.  This  assay is FDA approved for nasopharyngeal swab specimens only. Nasal  washings and aspirates are unacceptable for Xpert Xpress Flu testing.   Basic metabolic panel     Status: Abnormal   Collection Time: 12/26/16  4:50 PM  Result Value Ref Range   Sodium 132 (L) 135 - 145 mmol/L   Potassium 4.0 3.5 - 5.1 mmol/L   Chloride 97 (L) 101 - 111 mmol/L   CO2 26 22 - 32 mmol/L   Glucose, Bld 112 (H) 65 - 99 mg/dL   BUN 13 6 - 20 mg/dL   Creatinine, Ser 0.93 0.44 - 1.00 mg/dL   Calcium 9.3 8.9 - 10.3 mg/dL   GFR calc non Af Amer >60 >60 mL/min   GFR calc Af Amer >60 >60 mL/min    Comment: (NOTE) The eGFR has been calculated using the CKD EPI equation. This calculation has not been validated in all clinical situations. eGFR's persistently <60 mL/min signify possible Chronic Kidney Disease.    Anion gap 9 5 - 15  CBC with Differential     Status: Abnormal   Collection Time: 12/26/16  4:50 PM  Result Value Ref Range   WBC 8.6 4.0 - 10.5 K/uL   RBC 3.93 3.87 - 5.11 MIL/uL   Hemoglobin 12.8 12.0 - 15.0 g/dL   HCT 37.9 36.0 - 46.0 %   MCV 96.4 78.0 - 100.0 fL   MCH 32.6 26.0 - 34.0 pg   MCHC 33.8 30.0 - 36.0 g/dL   RDW 15.2 11.5 - 15.5 %   Platelets 180 150 - 400 K/uL   Neutrophils Relative % 66 %   Neutro Abs 5.7 1.7 - 7.7 K/uL   Lymphocytes Relative 20 %   Lymphs Abs 1.7 0.7 - 4.0 K/uL   Monocytes Relative 14 %   Monocytes Absolute 1.2 (H) 0.1 - 1.0 K/uL   Eosinophils Relative 0 %   Eosinophils  Absolute 0.0 0.0 - 0.7 K/uL   Basophils Relative 0 %   Basophils Absolute 0.0 0.0 - 0.1 K/uL  Lactic acid, plasma     Status: Abnormal   Collection Time: 12/26/16  4:50 PM  Result Value Ref Range   Lactic Acid, Venous 2.1 (HH) 0.5 - 1.9 mmol/L    Comment: CRITICAL RESULT CALLED TO, READ BACK BY AND VERIFIED WITH: VOGLER,T ON 12/26/16 AT 1740 BY LOY,C   Lactic acid, plasma     Status: Abnormal   Collection Time: 12/26/16  7:22 PM  Result Value Ref Range   Lactic Acid, Venous 2.8 (HH) 0.5 - 1.9 mmol/L    Comment: CRITICAL RESULT CALLED TO, READ BACK BY AND VERIFIED WITH: HAMLTON,S AT 2115 ON 12/26/2016 BY ISLEY,B   Lactic acid, plasma     Status: Abnormal   Collection Time: 12/26/16 10:18 PM  Result Value Ref Range   Lactic Acid, Venous 2.1 (HH) 0.5 - 1.9 mmol/L    Comment: CRITICAL RESULT CALLED TO, READ BACK BY AND VERIFIED WITH: HAMILTON S 2308 ON [id number]  BY FORSYTH K   Procalcitonin     Status: None   Collection Time: 12/26/16 10:18 PM  Result Value Ref Range   Procalcitonin <0.10 ng/mL    Comment:        Interpretation: PCT (Procalcitonin) <= 0.5 ng/mL: Systemic infection (sepsis) is not likely. Local bacterial infection is possible. (NOTE)         ICU PCT Algorithm               Non ICU PCT Algorithm    ----------------------------     ------------------------------         PCT < 0.25 ng/mL                 PCT < 0.1 ng/mL     Stopping of antibiotics            Stopping of antibiotics       strongly encouraged.               strongly encouraged.    ----------------------------     ------------------------------       PCT level decrease by               PCT < 0.25 ng/mL       >= 80% from peak PCT       OR PCT 0.25 - 0.5 ng/mL          Stopping of antibiotics                                             encouraged.     Stopping of antibiotics           encouraged.    ----------------------------     ------------------------------       PCT level decrease by              PCT >= 0.25 ng/mL       < 80% from peak PCT        AND PCT >= 0.5 ng/mL            Continuin g antibiotics  encouraged.       Continuing antibiotics            encouraged.    ----------------------------     ------------------------------     PCT level increase compared          PCT > 0.5 ng/mL         with peak PCT AND          PCT >= 0.5 ng/mL             Escalation of antibiotics                                          strongly encouraged.      Escalation of antibiotics        strongly encouraged.   Protime-INR     Status: None   Collection Time: 12/26/16 10:18 PM  Result Value Ref Range   Prothrombin Time 13.4 11.4 - 15.2 seconds   INR 1.02   APTT     Status: None   Collection Time: 12/26/16 10:18 PM  Result Value Ref Range   aPTT 25 24 - 36 seconds  Culture, blood (routine x 2) Call MD if unable to obtain  prior to antibiotics being given     Status: None (Preliminary result)   Collection Time: 12/26/16 10:18 PM  Result Value Ref Range   Specimen Description BLOOD LEFT HAND    Special Requests BOTTLES DRAWN AEROBIC AND ANAEROBIC 6CC    Culture PENDING    Report Status PENDING   Culture, blood (routine x 2) Call MD if unable to obtain prior to antibiotics being given     Status: None (Preliminary result)   Collection Time: 12/26/16 10:18 PM  Result Value Ref Range   Specimen Description BLOOD RIGHT HAND    Special Requests BOTTLES DRAWN AEROBIC ONLY 6CC    Culture PENDING    Report Status PENDING   Lactic acid, plasma     Status: Abnormal   Collection Time: 12/27/16  1:33 AM  Result Value Ref Range   Lactic Acid, Venous 2.0 (HH) 0.5 - 1.9 mmol/L    Comment: CRITICAL RESULT CALLED TO, READ BACK BY AND VERIFIED WITH: THOMAS K AT 0230 ON [id number] BY FORSYTH K   Basic metabolic panel     Status: Abnormal   Collection Time: 12/27/16  1:33 AM  Result Value Ref Range   Sodium 140 135 - 145 mmol/L    Comment: DELTA CHECK NOTED   Potassium 4.0 3.5 - 5.1 mmol/L   Chloride 103 101 - 111 mmol/L   CO2 26 22 - 32 mmol/L   Glucose, Bld 106 (H) 65 - 99 mg/dL   BUN 10 6 - 20 mg/dL   Creatinine, Ser 3.34 0.44 - 1.00 mg/dL   Calcium 8.9 8.9 - 35.6 mg/dL   GFR calc non Af Amer >60 >60 mL/min   GFR calc Af Amer >60 >60 mL/min    Comment: (NOTE) The eGFR has been calculated using the CKD EPI equation. This calculation has not been validated in all clinical situations. eGFR's persistently <60 mL/min signify possible Chronic Kidney Disease.    Anion gap 11 5 - 15  CBC WITH DIFFERENTIAL     Status: None   Collection Time: 12/27/16  1:33 AM  Result Value Ref Range   WBC 7.1 4.0 - 10.5 K/uL   RBC 3.88  3.87 - 5.11 MIL/uL   Hemoglobin 12.2 12.0 - 15.0 g/dL   HCT 37.5 36.0 - 46.0 %   MCV 96.6 78.0 - 100.0 fL   MCH 31.4 26.0 - 34.0 pg   MCHC 32.5 30.0 - 36.0 g/dL   RDW 15.4 11.5 - 15.5 %   Platelets  166 150 - 400 K/uL   Neutrophils Relative % 56 %   Neutro Abs 3.9 1.7 - 7.7 K/uL   Lymphocytes Relative 31 %   Lymphs Abs 2.2 0.7 - 4.0 K/uL   Monocytes Relative 13 %   Monocytes Absolute 0.9 0.1 - 1.0 K/uL   Eosinophils Relative 0 %   Eosinophils Absolute 0.0 0.0 - 0.7 K/uL   Basophils Relative 0 %   Basophils Absolute 0.0 0.0 - 0.1 K/uL    ABGS No results for input(s): PHART, PO2ART, TCO2, HCO3 in the last 72 hours.  Invalid input(s): PCO2 CULTURES Recent Results (from the past 240 hour(s))  Culture, blood (routine x 2) Call MD if unable to obtain prior to antibiotics being given     Status: None (Preliminary result)   Collection Time: 12/26/16 10:18 PM  Result Value Ref Range Status   Specimen Description BLOOD LEFT HAND  Final   Special Requests BOTTLES DRAWN AEROBIC AND ANAEROBIC 6CC  Final   Culture PENDING  Incomplete   Report Status PENDING  Incomplete  Culture, blood (routine x 2) Call MD if unable to obtain prior to antibiotics being given     Status: None (Preliminary result)   Collection Time: 12/26/16 10:18 PM  Result Value Ref Range Status   Specimen Description BLOOD RIGHT HAND  Final   Special Requests BOTTLES DRAWN AEROBIC ONLY [place]  Final   Culture PENDING  Incomplete   Report Status PENDING  Incomplete   Studies/Results: Dg Chest Portable 1 View  Result Date: 12/26/2016 CLINICAL DATA:  Acute onset of fever, productive cough, congestion and body aches. Mild right-sided chest pain. Initial encounter. EXAM: PORTABLE CHEST 1 VIEW COMPARISON:  Chest radiograph performed 06/22/2013, and report from CTA of the chest performed 09/07/2011 FINDINGS: The lungs are well-aerated. Vascular congestion is noted. Increased prominence of an apparent vascular structure at the right lower lung zone, measuring nearly 3 cm in diameter, likely reflects worsening pulmonary hypertension, given the patient's history per prior CTA report. Mild haziness at the right lung base could reflect  mild infection. There is no evidence of pleural effusion or pneumothorax. The cardiomediastinal silhouette is borderline normal in size. No acute osseous abnormalities are seen. IMPRESSION: 1. Mild haziness at the right lung base could reflect mild infection. 2. Vascular congestion noted. Increased prominence of an apparent vascular structure at the right lower lung zone, measuring nearly 3 cm in diameter, likely reflects worsening pulmonary hypertension, given the patient's history per prior CTA report. Would correlate with the patient's symptoms. Electronically Signed   By: Garald Balding M.D.   On: 12/26/2016 16:40    Medications: I have reviewed the patient's current medications.  Assesment:  Principal Problem:   Sepsis ([place]) Active Problems:   Influenza   CAP (community acquired pneumonia)   Tobacco use disorder   HIV (human immunodeficiency virus infection) ([place])   Schizophrenia ([place])    Plan:  Medications reviewed Continue IV antibiotics and Tamiflu Continue regular medications including antiretroviral Will monitor CBC/BMP Will follow culture result.    LOS: 0 days   Bobie Kistler 12/27/2016, 8:12 AM

## 2016-12-27 NOTE — Care Management Note (Signed)
Case Management Note  Patient Details  Name: Karina Clarke MRN: [id number] Date of Birth: 07/15/1961  Subjective/Objective:                  Pt admitted with flu. She is from [facility] group home. Chart reviewed for CM needs. Pt/family plan on pt returning at DC. CSW following and will make arrangements for return to facility once medical ready. Pt not on supplemental oxygen at home PTA.   Action/Plan: Plan on DC back to group home. Pt will need home oxygen assessment if unable to wean.   Expected Discharge Date:    12/29/2016              Expected Discharge Plan:  Home/Self Care  In-House Referral:  NA  Discharge planning Services  CM Consult  Post Acute Care Choice:  NA Choice offered to:  NA  Status of Service:  Will cont to follow.   Sherald Barge, RN 12/27/2016, 2:49 PM

## 2016-12-27 NOTE — Clinical Social Work Note (Signed)
CSW spoke with pt's sister/legal guardian who confirms plan to return to [facility] when medically stable.  Benay Pike, [place]

## 2016-12-27 NOTE — Clinical Social Work Note (Signed)
Clinical Social Work Assessment  Patient Details  Name: Karina Clarke MRN: [id number] Date of Birth: Sep 27, 1961  Date of referral:  12/27/16               Reason for consult:  Discharge Planning                Permission sought to share information with:    Permission granted to share information::     Name::        Agency::     Relationship::     Contact Information:     Housing/Transportation Living arrangements for the past 2 months:  [place] of Information:  Patient, Facility Patient Interpreter Needed:  None Criminal Activity/Legal Involvement Pertinent to Current Situation/Hospitalization:  No - Comment as needed Significant Relationships:  Siblings Lives with:  Facility Resident Do you feel safe going back to the place where you live?  Yes Need for family participation in patient care:  Yes (Comment)  Care giving concerns:  None reported. Pt is long term resident at group home.    Social Worker assessment / plan:  CSW met with pt at bedside. Pt alert, but oriented to self and place only. She did seem to like [facility] and requested to return there. CSW spoke with Jeronimo Greaves, administrator at facility. He states pt has been a resident there for several years. Her sister, Harmon Pier is legal guardian. Pt requires assist with dressing and bathing but is otherwise independent. No home health prior to admission and okay to return. CSW notified Mr. Juleen China that pt tested positive for flu and he said he was already aware. CSW attempted to reach Harmon Pier, but no voicemail set up. Anticipate return to [facility].   Employment status:  Disabled (Comment on whether or not currently receiving Disability) Insurance information:  Medicaid [place] PT Recommendations:  Not assessed at this time Information / Referral to community resources:  Other (Comment Required) (Return to [place])  Patient/Family's Response to care:  Pt requests to return to [facility] when medically stable. Will  continue to try to reach sister.   Patient/Family's Understanding of and Emotional Response to Diagnosis, Current Treatment, and Prognosis:  Will discuss with pt's sister/guardian when able to reach her.   Emotional Assessment Appearance:  Appears stated age Attitude/Demeanor/Rapport:  Other (Pleasant) Affect (typically observed):  Accepting Orientation:  Oriented to Self, Oriented to Place Alcohol / Substance use:  Not Applicable Psych involvement (Current and /or in the community):  No (Comment)  Discharge Needs  Concerns to be addressed:  Discharge Planning Concerns Readmission within the last 30 days:  No Current discharge risk:  None Barriers to Discharge:  Continued Medical Work up   Salome Arnt, [place] 12/27/2016, 8:43 AM [phone]

## 2016-12-28 LAB — CBC
HEMATOCRIT: 35.2 % — AB (ref 36.0–46.0)
HEMOGLOBIN: 11.3 g/dL — AB (ref 12.0–15.0)
MCH: 31.3 pg (ref 26.0–34.0)
MCHC: 32.1 g/dL (ref 30.0–36.0)
MCV: 97.5 fL (ref 78.0–100.0)
Platelets: 136 10*3/uL — ABNORMAL LOW (ref 150–400)
RBC: 3.61 MIL/uL — ABNORMAL LOW (ref 3.87–5.11)
RDW: 15.5 % (ref 11.5–15.5)
WBC: 5.1 10*3/uL (ref 4.0–10.5)

## 2016-12-28 LAB — BASIC METABOLIC PANEL
ANION GAP: 8 (ref 5–15)
BUN: 8 mg/dL (ref 6–20)
CALCIUM: 8.2 mg/dL — AB (ref 8.9–10.3)
CO2: 27 mmol/L (ref 22–32)
Chloride: 101 mmol/L (ref 101–111)
Creatinine, Ser: 0.89 mg/dL (ref 0.44–1.00)
GFR calc Af Amer: >60 mL/min (ref >60)
GFR calc non Af Amer: >60 mL/min (ref >60)
GLUCOSE: 79 mg/dL (ref 65–99)
Potassium: 3.6 mmol/L (ref 3.5–5.1)
Sodium: 136 mmol/L (ref 135–145)

## 2016-12-28 NOTE — Progress Notes (Signed)
Subjective: Patient complains of body ache. No fever or chills. She is coughing intermittently. No nausea or vomiting..  Objective: Vital signs in last 24 hours: Temp:  [98.5 F (36.9 C)-102.1 F (38.9 C)] 99 F (37.2 C) (02/03 0500) Pulse Rate:  [96-111] 96 (02/03 0500) Resp:  [18-20] 20 (02/03 0500) BP: (98-118)/(64-74) 115/64 (02/03 0500) SpO2:  [92 %-97 %] 97 % (02/03 0500) Weight change:  Last BM Date: 12/26/16  Intake/Output from previous day: 02/02 0701 - 02/03 0700 In: 2700 [P.O.:600; I.V.:1800; IV Piggyback:300] Out: -   PHYSICAL EXAM General appearance: alert, fatigued and no distress Resp: diminished breath sounds bilaterally and rhonchi bilaterally Cardio: S1, S2 normal GI: soft, non-tender; bowel sounds normal; no masses,  no organomegaly Extremities: extremities normal, atraumatic, no cyanosis or edema  Lab Results:  Results for orders placed or performed during the hospital encounter of 12/26/16 (from the past 48 hour(s))  Influenza panel by PCR (type A & B)     Status: Abnormal   Collection Time: 12/26/16 12:30 PM  Result Value Ref Range   Influenza A By PCR POSITIVE (A) NEGATIVE   Influenza B By PCR NEGATIVE NEGATIVE    Comment: (NOTE) The Xpert Xpress Flu assay is intended as an aid in the diagnosis of  influenza and should not be used as a sole basis for treatment.  This  assay is FDA approved for nasopharyngeal swab specimens only. Nasal  washings and aspirates are unacceptable for Xpert Xpress Flu testing.   Basic metabolic panel     Status: Abnormal   Collection Time: 12/26/16  4:50 PM  Result Value Ref Range   Sodium 132 (L) 135 - 145 mmol/L   Potassium 4.0 3.5 - 5.1 mmol/L   Chloride 97 (L) 101 - 111 mmol/L   CO2 26 22 - 32 mmol/L   Glucose, Bld 112 (H) 65 - 99 mg/dL   BUN 13 6 - 20 mg/dL   Creatinine, Ser 0.93 0.44 - 1.00 mg/dL   Calcium 9.3 8.9 - 10.3 mg/dL   GFR calc non Af Amer >60 >60 mL/min   GFR calc Af Amer >60 >60 mL/min   Comment: (NOTE) The eGFR has been calculated using the CKD EPI equation. This calculation has not been validated in all clinical situations. eGFR's persistently <60 mL/min signify possible Chronic Kidney Disease.    Anion gap 9 5 - 15  CBC with Differential     Status: Abnormal   Collection Time: 12/26/16  4:50 PM  Result Value Ref Range   WBC 8.6 4.0 - 10.5 K/uL   RBC 3.93 3.87 - 5.11 MIL/uL   Hemoglobin 12.8 12.0 - 15.0 g/dL   HCT 37.9 36.0 - 46.0 %   MCV 96.4 78.0 - 100.0 fL   MCH 32.6 26.0 - 34.0 pg   MCHC 33.8 30.0 - 36.0 g/dL   RDW 15.2 11.5 - 15.5 %   Platelets 180 150 - 400 K/uL   Neutrophils Relative % 66 %   Neutro Abs 5.7 1.7 - 7.7 K/uL   Lymphocytes Relative 20 %   Lymphs Abs 1.7 0.7 - 4.0 K/uL   Monocytes Relative 14 %   Monocytes Absolute 1.2 (H) 0.1 - 1.0 K/uL   Eosinophils Relative 0 %   Eosinophils Absolute 0.0 0.0 - 0.7 K/uL   Basophils Relative 0 %   Basophils Absolute 0.0 0.0 - 0.1 K/uL  Lactic acid, plasma     Status: Abnormal   Collection Time: 12/26/16  4:50 PM  Result Value Ref Range   Lactic Acid, Venous 2.1 (HH) 0.5 - 1.9 mmol/L    Comment: CRITICAL RESULT CALLED TO, READ BACK BY AND VERIFIED WITH: VOGLER,T ON 12/26/16 AT 1740 BY LOY,C   Lactic acid, plasma     Status: Abnormal   Collection Time: 12/26/16  7:22 PM  Result Value Ref Range   Lactic Acid, Venous 2.8 (HH) 0.5 - 1.9 mmol/L    Comment: CRITICAL RESULT CALLED TO, READ BACK BY AND VERIFIED WITH: HAMLTON,S AT 2115 ON 12/26/2016 BY ISLEY,B   Lactic acid, plasma     Status: Abnormal   Collection Time: 12/26/16 10:18 PM  Result Value Ref Range   Lactic Acid, Venous 2.1 (HH) 0.5 - 1.9 mmol/L    Comment: CRITICAL RESULT CALLED TO, READ BACK BY AND VERIFIED WITH: HAMILTON S 2308 ON [id number] BY FORSYTH K   Procalcitonin     Status: None   Collection Time: 12/26/16 10:18 PM  Result Value Ref Range   Procalcitonin <0.10 ng/mL    Comment:        Interpretation: PCT (Procalcitonin) <= 0.5  ng/mL: Systemic infection (sepsis) is not likely. Local bacterial infection is possible. (NOTE)         ICU PCT Algorithm               Non ICU PCT Algorithm    ----------------------------     ------------------------------         PCT < 0.25 ng/mL                 PCT < 0.1 ng/mL     Stopping of antibiotics            Stopping of antibiotics       strongly encouraged.               strongly encouraged.    ----------------------------     ------------------------------       PCT level decrease by               PCT < 0.25 ng/mL       >= 80% from peak PCT       OR PCT 0.25 - 0.5 ng/mL          Stopping of antibiotics                                             encouraged.     Stopping of antibiotics           encouraged.    ----------------------------     ------------------------------       PCT level decrease by              PCT >= 0.25 ng/mL       < 80% from peak PCT        AND PCT >= 0.5 ng/mL            Continuin g antibiotics                                              encouraged.       Continuing antibiotics            encouraged.    ----------------------------     ------------------------------  PCT level increase compared          PCT > 0.5 ng/mL         with peak PCT AND          PCT >= 0.5 ng/mL             Escalation of antibiotics                                          strongly encouraged.      Escalation of antibiotics        strongly encouraged.   Protime-INR     Status: None   Collection Time: 12/26/16 10:18 PM  Result Value Ref Range   Prothrombin Time 13.4 11.4 - 15.2 seconds   INR 1.02   APTT     Status: None   Collection Time: 12/26/16 10:18 PM  Result Value Ref Range   aPTT 25 24 - 36 seconds  Culture, blood (routine x 2) Call MD if unable to obtain prior to antibiotics being given     Status: None (Preliminary result)   Collection Time: 12/26/16 10:18 PM  Result Value Ref Range   Specimen Description BLOOD LEFT HAND    Special Requests BOTTLES  DRAWN AEROBIC AND ANAEROBIC 6CC    Culture NO GROWTH < 12 HOURS    Report Status PENDING   Culture, blood (routine x 2) Call MD if unable to obtain prior to antibiotics being given     Status: None (Preliminary result)   Collection Time: 12/26/16 10:18 PM  Result Value Ref Range   Specimen Description BLOOD RIGHT HAND    Special Requests BOTTLES DRAWN AEROBIC ONLY 6CC    Culture NO GROWTH < 12 HOURS    Report Status PENDING   Lactic acid, plasma     Status: Abnormal   Collection Time: 12/27/16  1:33 AM  Result Value Ref Range   Lactic Acid, Venous 2.0 (HH) 0.5 - 1.9 mmol/L    Comment: CRITICAL RESULT CALLED TO, READ BACK BY AND VERIFIED WITH: THOMAS K AT 0230 ON [id number] BY FORSYTH K   Basic metabolic panel     Status: Abnormal   Collection Time: 12/27/16  1:33 AM  Result Value Ref Range   Sodium 140 135 - 145 mmol/L    Comment: DELTA CHECK NOTED   Potassium 4.0 3.5 - 5.1 mmol/L   Chloride 103 101 - 111 mmol/L   CO2 26 22 - 32 mmol/L   Glucose, Bld 106 (H) 65 - 99 mg/dL   BUN 10 6 - 20 mg/dL   Creatinine, Ser 0.85 0.44 - 1.00 mg/dL   Calcium 8.9 8.9 - 10.3 mg/dL   GFR calc non Af Amer >60 >60 mL/min   GFR calc Af Amer >60 >60 mL/min    Comment: (NOTE) The eGFR has been calculated using the CKD EPI equation. This calculation has not been validated in all clinical situations. eGFR's persistently <60 mL/min signify possible Chronic Kidney Disease.    Anion gap 11 5 - 15  CBC WITH DIFFERENTIAL     Status: None   Collection Time: 12/27/16  1:33 AM  Result Value Ref Range   WBC 7.1 4.0 - 10.5 K/uL   RBC 3.88 3.87 - 5.11 MIL/uL   Hemoglobin 12.2 12.0 - 15.0 g/dL   HCT 37.5 36.0 - 46.0 %   MCV 96.6 78.0 -  100.0 fL   MCH 31.4 26.0 - 34.0 pg   MCHC 32.5 30.0 - 36.0 g/dL   RDW 03.4 96.1 - 16.4 %   Platelets 166 150 - 400 K/uL   Neutrophils Relative % 56 %   Neutro Abs 3.9 1.7 - 7.7 K/uL   Lymphocytes Relative 31 %   Lymphs Abs 2.2 0.7 - 4.0 K/uL   Monocytes Relative 13 %    Monocytes Absolute 0.9 0.1 - 1.0 K/uL   Eosinophils Relative 0 %   Eosinophils Absolute 0.0 0.0 - 0.7 K/uL   Basophils Relative 0 %   Basophils Absolute 0.0 0.0 - 0.1 K/uL  Basic metabolic panel     Status: Abnormal   Collection Time: 12/28/16  6:44 AM  Result Value Ref Range   Sodium 136 135 - 145 mmol/L   Potassium 3.6 3.5 - 5.1 mmol/L   Chloride 101 101 - 111 mmol/L   CO2 27 22 - 32 mmol/L   Glucose, Bld 79 65 - 99 mg/dL   BUN 8 6 - 20 mg/dL   Creatinine, Ser 3.53 0.44 - 1.00 mg/dL   Calcium 8.2 (L) 8.9 - 10.3 mg/dL   GFR calc non Af Amer >60 >60 mL/min   GFR calc Af Amer >60 >60 mL/min    Comment: (NOTE) The eGFR has been calculated using the CKD EPI equation. This calculation has not been validated in all clinical situations. eGFR's persistently <60 mL/min signify possible Chronic Kidney Disease.    Anion gap 8 5 - 15  CBC     Status: Abnormal   Collection Time: 12/28/16  6:44 AM  Result Value Ref Range   WBC 5.1 4.0 - 10.5 K/uL   RBC 3.61 (L) 3.87 - 5.11 MIL/uL   Hemoglobin 11.3 (L) 12.0 - 15.0 g/dL   HCT 91.2 (L) 25.8 - 34.6 %   MCV 97.5 78.0 - 100.0 fL   MCH 31.3 26.0 - 34.0 pg   MCHC 32.1 30.0 - 36.0 g/dL   RDW 21.9 47.1 - 25.2 %   Platelets 136 (L) 150 - 400 K/uL    ABGS No results for input(s): PHART, PO2ART, TCO2, HCO3 in the last 72 hours.  Invalid input(s): PCO2 CULTURES Recent Results (from the past 240 hour(s))  Culture, blood (routine x 2) Call MD if unable to obtain prior to antibiotics being given     Status: None (Preliminary result)   Collection Time: 12/26/16 10:18 PM  Result Value Ref Range Status   Specimen Description BLOOD LEFT HAND  Final   Special Requests BOTTLES DRAWN AEROBIC AND ANAEROBIC 6CC  Final   Culture NO GROWTH < 12 HOURS  Final   Report Status PENDING  Incomplete  Culture, blood (routine x 2) Call MD if unable to obtain prior to antibiotics being given     Status: None (Preliminary result)   Collection Time: 12/26/16 10:18  PM  Result Value Ref Range Status   Specimen Description BLOOD RIGHT HAND  Final   Special Requests BOTTLES DRAWN AEROBIC ONLY 6CC  Final   Culture NO GROWTH < 12 HOURS  Final   Report Status PENDING  Incomplete   Studies/Results: Dg Chest Portable 1 View  Result Date: 12/26/2016 CLINICAL DATA:  Acute onset of fever, productive cough, congestion and body aches. Mild right-sided chest pain. Initial encounter. EXAM: PORTABLE CHEST 1 VIEW COMPARISON:  Chest radiograph performed 06/22/2013, and report from CTA of the chest performed 09/07/2011 FINDINGS: The lungs are well-aerated. Vascular congestion is  noted. Increased prominence of an apparent vascular structure at the right lower lung zone, measuring nearly 3 cm in diameter, likely reflects worsening pulmonary hypertension, given the patient's history per prior CTA report. Mild haziness at the right lung base could reflect mild infection. There is no evidence of pleural effusion or pneumothorax. The cardiomediastinal silhouette is borderline normal in size. No acute osseous abnormalities are seen. IMPRESSION: 1. Mild haziness at the right lung base could reflect mild infection. 2. Vascular congestion noted. Increased prominence of an apparent vascular structure at the right lower lung zone, measuring nearly 3 cm in diameter, likely reflects worsening pulmonary hypertension, given the patient's history per prior CTA report. Would correlate with the patient's symptoms. Electronically Signed   By: Garald Balding M.D.   On: 12/26/2016 16:40    Medications: I have reviewed the patient's current medications.  Assesment:  Principal Problem:   Sepsis ([place]) Active Problems:   Influenza   CAP (community acquired pneumonia)   Tobacco use disorder   HIV (human immunodeficiency virus infection) ([place])   Schizophrenia ([place])    Plan:  Medications reviewed Continue IV antibiotics and Tamiflu Continue regular medications including antiretroviral CBC/BMP  /lactic acid level    LOS: 1 day   Kameo Bains 12/28/2016, 9:44 AM

## 2016-12-29 LAB — BASIC METABOLIC PANEL
ANION GAP: 6 (ref 5–15)
BUN: 6 mg/dL (ref 6–20)
CHLORIDE: 101 mmol/L (ref 101–111)
CO2: 30 mmol/L (ref 22–32)
Calcium: 8.1 mg/dL — ABNORMAL LOW (ref 8.9–10.3)
Creatinine, Ser: 0.9 mg/dL (ref 0.44–1.00)
GFR calc Af Amer: >60 mL/min (ref >60)
GFR calc non Af Amer: >60 mL/min (ref >60)
GLUCOSE: 90 mg/dL (ref 65–99)
POTASSIUM: 3.5 mmol/L (ref 3.5–5.1)
Sodium: 137 mmol/L (ref 135–145)

## 2016-12-29 LAB — CBC
HEMATOCRIT: 33.5 % — AB (ref 36.0–46.0)
HEMOGLOBIN: 10.8 g/dL — AB (ref 12.0–15.0)
MCH: 31.4 pg (ref 26.0–34.0)
MCHC: 32.2 g/dL (ref 30.0–36.0)
MCV: 97.4 fL (ref 78.0–100.0)
Platelets: 135 10*3/uL — ABNORMAL LOW (ref 150–400)
RBC: 3.44 MIL/uL — ABNORMAL LOW (ref 3.87–5.11)
RDW: 15.3 % (ref 11.5–15.5)
WBC: 4.2 10*3/uL (ref 4.0–10.5)

## 2016-12-29 NOTE — Progress Notes (Signed)
Pharmacy Antibiotic Note  Karina Clarke is a 55 y.o. female admitted on 12/26/2016 with Pneumonia.  Pharmacy has been consulted for Ceftriaxone and azithromycin dosing. Doses are appropriate.   Plan: Pharmacy will sign off.  Height: 6\' 1"  (185.4 cm) Weight: 244 lb 11.2 oz (111 kg) IBW/kg (Calculated) : 75.4  Temp (24hrs), Avg:99.3 F (37.4 C), Min:98.4 F (36.9 C), Max:100.3 F (37.9 C)   Recent Labs Lab 12/26/16 1650 12/26/16 1922 12/26/16 2218 12/27/16 0133 12/28/16 0644 12/29/16 0631  WBC 8.6  --   --  7.1 5.1 4.2  CREATININE 0.93  --   --  0.85 0.89 0.90  LATICACIDVEN 2.1* 2.8* 2.1* 2.0*  --   --     Estimated Creatinine Clearance: 102.3 mL/min (by C-G formula based on SCr of 0.9 mg/dL).    Allergies  Allergen Reactions  . Asa [Aspirin] Nausea And Vomiting    Antimicrobials this admission: Ceftriaxone 2/1 >>  Azithromycin 2/1 >>   Microbiology results: 2/1 BCx: pending 2/1 UCx: pending 2/1 influenza A+  Thank you for allowing pharmacy to be a part of this patient's care.  Excell Seltzer, PharmD 12/29/2016 9:35 AM

## 2016-12-29 NOTE — Progress Notes (Signed)
Subjective: Patient feels better. Her fever is subsiding. The body ache is better. She wants to try regular diet.  Objective: Vital signs in last 24 hours: Temp:  [98.4 F (36.9 C)-100.3 F (37.9 C)] 99.2 F (37.3 C) (02/04 0524) Pulse Rate:  [90-93] 90 (02/04 0524) Resp:  [18-20] 20 (02/04 0524) BP: (109-119)/(59-74) 109/59 (02/04 0524) SpO2:  [94 %-96 %] 94 % (02/04 0524) Weight change:  Last BM Date: 12/27/16  Intake/Output from previous day: 02/03 0701 - 02/04 0700 In: 2595 [P.O.:720; I.V.:1875] Out: -   PHYSICAL EXAM General appearance: alert, fatigued and no distress Resp: diminished breath sounds bilaterally and rhonchi bilaterally Cardio: S1, S2 normal GI: soft, non-tender; bowel sounds normal; no masses,  no organomegaly Extremities: extremities normal, atraumatic, no cyanosis or edema  Lab Results:  Results for orders placed or performed during the hospital encounter of 12/26/16 (from the past 48 hour(s))  Basic metabolic panel     Status: Abnormal   Collection Time: 12/28/16  6:44 AM  Result Value Ref Range   Sodium 136 135 - 145 mmol/L   Potassium 3.6 3.5 - 5.1 mmol/L   Chloride 101 101 - 111 mmol/L   CO2 27 22 - 32 mmol/L   Glucose, Bld 79 65 - 99 mg/dL   BUN 8 6 - 20 mg/dL   Creatinine, Ser 0.89 0.44 - 1.00 mg/dL   Calcium 8.2 (L) 8.9 - 10.3 mg/dL   GFR calc non Af Amer >60 >60 mL/min   GFR calc Af Amer >60 >60 mL/min    Comment: (NOTE) The eGFR has been calculated using the CKD EPI equation. This calculation has not been validated in all clinical situations. eGFR's persistently <60 mL/min signify possible Chronic Kidney Disease.    Anion gap 8 5 - 15  CBC     Status: Abnormal   Collection Time: 12/28/16  6:44 AM  Result Value Ref Range   WBC 5.1 4.0 - 10.5 K/uL   RBC 3.61 (L) 3.87 - 5.11 MIL/uL   Hemoglobin 11.3 (L) 12.0 - 15.0 g/dL   HCT 35.2 (L) 36.0 - 46.0 %   MCV 97.5 78.0 - 100.0 fL   MCH 31.3 26.0 - 34.0 pg   MCHC 32.1 30.0 - 36.0 g/dL    RDW 15.5 11.5 - 15.5 %   Platelets 136 (L) 150 - 400 K/uL  Basic metabolic panel     Status: Abnormal   Collection Time: 12/29/16  6:31 AM  Result Value Ref Range   Sodium 137 135 - 145 mmol/L   Potassium 3.5 3.5 - 5.1 mmol/L   Chloride 101 101 - 111 mmol/L   CO2 30 22 - 32 mmol/L   Glucose, Bld 90 65 - 99 mg/dL   BUN 6 6 - 20 mg/dL   Creatinine, Ser 0.90 0.44 - 1.00 mg/dL   Calcium 8.1 (L) 8.9 - 10.3 mg/dL   GFR calc non Af Amer >60 >60 mL/min   GFR calc Af Amer >60 >60 mL/min    Comment: (NOTE) The eGFR has been calculated using the CKD EPI equation. This calculation has not been validated in all clinical situations. eGFR's persistently <60 mL/min signify possible Chronic Kidney Disease.    Anion gap 6 5 - 15  CBC     Status: Abnormal   Collection Time: 12/29/16  6:31 AM  Result Value Ref Range   WBC 4.2 4.0 - 10.5 K/uL   RBC 3.44 (L) 3.87 - 5.11 MIL/uL   Hemoglobin 10.8 (L)  12.0 - 15.0 g/dL   HCT 33.5 (L) 36.0 - 46.0 %   MCV 97.4 78.0 - 100.0 fL   MCH 31.4 26.0 - 34.0 pg   MCHC 32.2 30.0 - 36.0 g/dL   RDW 15.3 11.5 - 15.5 %   Platelets 135 (L) 150 - 400 K/uL    ABGS No results for input(s): PHART, PO2ART, TCO2, HCO3 in the last 72 hours.  Invalid input(s): PCO2 CULTURES Recent Results (from the past 240 hour(s))  Culture, blood (routine x 2) Call MD if unable to obtain prior to antibiotics being given     Status: None (Preliminary result)   Collection Time: 12/26/16 10:18 PM  Result Value Ref Range Status   Specimen Description BLOOD LEFT HAND  Final   Special Requests BOTTLES DRAWN AEROBIC AND ANAEROBIC 6CC  Final   Culture NO GROWTH 3 DAYS  Final   Report Status PENDING  Incomplete  Culture, blood (routine x 2) Call MD if unable to obtain prior to antibiotics being given     Status: None (Preliminary result)   Collection Time: 12/26/16 10:18 PM  Result Value Ref Range Status   Specimen Description BLOOD RIGHT HAND  Final   Special Requests BOTTLES DRAWN  AEROBIC ONLY [place]  Final   Culture NO GROWTH 3 DAYS  Final   Report Status PENDING  Incomplete   Studies/Results: No results found.  Medications: I have reviewed the patient's current medications.  Assesment:  Principal Problem:   Sepsis ([place]) Active Problems:   Influenza   CAP (community acquired pneumonia)   Tobacco use disorder   HIV (human immunodeficiency virus infection) ([place])   Schizophrenia ([place])    Plan:  Medications reviewed Continue IV antibiotics and Tamiflu Regular diet D/Ctelemetry ambulate    LOS: 2 days   Yusif Gnau 12/29/2016, 12:51 PM

## 2016-12-30 MED ORDER — OSELTAMIVIR PHOSPHATE 75 MG PO CAPS: 75.0000 mg | ORAL_CAPSULE | Freq: Two times a day (BID) | ORAL | 0 refills | Status: DC

## 2016-12-30 MED ORDER — AMOXICILLIN-POT CLAVULANATE 500-125 MG PO TABS: 1.0000 | ORAL_TABLET | Freq: Three times a day (TID) | ORAL | 0 refills | Status: DC

## 2016-12-30 MED ORDER — NICOTINE 14 MG/24HR TD PT24: 14.0000 mg | MEDICATED_PATCH | Freq: Every day | TRANSDERMAL | 0 refills | Status: DC

## 2016-12-30 NOTE — Clinical Social Work Note (Signed)
Pt d/c today back to [facility]. Pt's sister/guardian notified by voicemail and facility aware and agreeable. Facility to pick up pt today.  Benay Pike, [place]

## 2016-12-30 NOTE — Discharge Summary (Addendum)
Physician Discharge Summary  Patient ID: Karina Clarke MRN: [id number] DOB/AGE: 54-Jul-1962 54 y.o. Primary [place], MD Admit date: 12/26/2016 Discharge date: 12/30/2016    Discharge Diagnoses:   Principal Problem:   Sepsis [facility]) Active Problems:   Influenza   CAP (community acquired pneumonia)   Tobacco use disorder   HIV (human immunodeficiency virus infection) ([place])   Schizophrenia ([place])   Allergies as of 12/30/2016      Reactions   Asa [aspirin] Nausea And Vomiting      Medication List    TAKE these medications   abacavir-lamiVUDine 600-300 MG tablet Commonly known as:  EPZICOM Take 1 tablet by mouth daily.   amantadine 100 MG capsule Commonly known as:  SYMMETREL Take 100 mg by mouth 2 (two) times daily.   amoxicillin-clavulanate 500-125 MG tablet Commonly known as:  AUGMENTIN Take 1 tablet (500 mg total) by mouth 3 (three) times daily.   atazanavir 200 MG capsule Commonly known as:  REYATAZ Take 400 mg by mouth daily.   atorvastatin 10 MG tablet Commonly known as:  LIPITOR Take 10 mg by mouth 2 (two) times daily.   divalproex 500 MG DR tablet Commonly known as:  DEPAKOTE Take 500 mg by mouth See admin instructions. Take 1 tablet (500 mg) by mouth at 4pm and 8pm daily   haloperidol 10 MG tablet Commonly known as:  HALDOL Take 20 mg by mouth 2 (two) times daily.   haloperidol decanoate 100 MG/ML injection Commonly known as:  HALDOL DECANOATE Inject 100 mg into the muscle every 28 (twenty-eight) days. Last injection 06/09/15   nicotine 14 mg/24hr patch Commonly known as:  NICODERM CQ - dosed in mg/24 hours Place 1 patch (14 mg total) onto the skin daily.   oseltamivir 75 MG capsule Commonly known as:  TAMIFLU Take 1 capsule (75 mg total) by mouth 2 (two) times daily.   oseltamivir 75 MG capsule Commonly known as:  TAMIFLU Take 1 capsule (75 mg total) by mouth every 12 (twelve) hours.   oxyCODONE-acetaminophen 5-325 MG  tablet Commonly known as:  ROXICET Take 1-2 tablets by mouth every 4 (four) hours as needed.   QUEtiapine 200 MG tablet Commonly known as:  SEROQUEL Take 200 mg by mouth See admin instructions. Take 1 tablet (200 mg) by mouth with supper and at bedtime daily   raltegravir 400 MG tablet Commonly known as:  ISENTRESS Take 400 mg by mouth 2 (two) times daily.   valACYclovir 1000 MG tablet Commonly known as:  VALTREX Take 1,000 mg by mouth daily.   zolpidem 10 MG tablet Commonly known as:  AMBIEN Take 5 mg by mouth at bedtime.       Discharged Condition: improved    Consults: none  Significant Diagnostic Studies: Dg Chest Portable 1 View  Result Date: 12/26/2016 CLINICAL DATA:  Acute onset of fever, productive cough, congestion and body aches. Mild right-sided chest pain. Initial encounter. EXAM: PORTABLE CHEST 1 VIEW COMPARISON:  Chest radiograph performed 06/22/2013, and report from CTA of the chest performed 09/07/2011 FINDINGS: The lungs are well-aerated. Vascular congestion is noted. Increased prominence of an apparent vascular structure at the right lower lung zone, measuring nearly 3 cm in diameter, likely reflects worsening pulmonary hypertension, given the patient's history per prior CTA report. Mild haziness at the right lung base could reflect mild infection. There is no evidence of pleural effusion or pneumothorax. The cardiomediastinal silhouette is borderline normal in size. No acute osseous abnormalities are seen. IMPRESSION: 1. Mild haziness  at the right lung base could reflect mild infection. 2. Vascular congestion noted. Increased prominence of an apparent vascular structure at the right lower lung zone, measuring nearly 3 cm in diameter, likely reflects worsening pulmonary hypertension, given the patient's history per prior CTA report. Would correlate with the patient's symptoms. Electronically Signed   By: Garald Balding M.D.   On: 12/26/2016 16:40    Lab  Results: Basic Metabolic Panel:  Recent Labs  12/28/16 0644 12/29/16 0631  NA 136 137  K 3.6 3.5  CL 101 101  CO2 27 30  GLUCOSE 79 90  BUN 8 6  CREATININE 0.89 0.90  CALCIUM 8.2* 8.1*   Liver Function Tests: No results for input(s): AST, ALT, ALKPHOS, BILITOT, PROT, ALBUMIN in the last 72 hours.   CBC:  Recent Labs  12/28/16 0644 12/29/16 0631  WBC 5.1 4.2  HGB 11.3* 10.8*  HCT 35.2* 33.5*  MCV 97.5 97.4  PLT 136* 135*    Recent Results (from the past 240 hour(s))  Culture, blood (routine x 2) Call MD if unable to obtain prior to antibiotics being given     Status: None (Preliminary result)   Collection Time: 12/26/16 10:18 PM  Result Value Ref Range Status   Specimen Description BLOOD LEFT HAND  Final   Special Requests BOTTLES DRAWN AEROBIC AND ANAEROBIC 6CC  Final   Culture NO GROWTH 3 DAYS  Final   Report Status PENDING  Incomplete  Culture, blood (routine x 2) Call MD if unable to obtain prior to antibiotics being given     Status: None (Preliminary result)   Collection Time: 12/26/16 10:18 PM  Result Value Ref Range Status   Specimen Description BLOOD RIGHT HAND  Final   Special Requests BOTTLES DRAWN AEROBIC ONLY [place]  Final   Culture NO GROWTH 3 DAYS  Final   Report Status PENDING  Incomplete     Hospital Course:   This is a 56 years old female with huistory of multiple medical illnesses was admitted due to fever, chills and body ache. Patient was found to be + for influenza A. She had aklso sign of pneumonia. Patient was treated with IV antibniotics and tamiflu. Patient improved and discharged on oral antibiotics,  Discharge Exam: Blood pressure 121/60, pulse 75, temperature 97.7 F (36.5 C), temperature source Oral, resp. rate 18, height 6\' 1"  (1.854 m), weight 111 kg (244 lb 11.2 oz), SpO2 95 %.   Disposition:  home    Follow-up Information    Majed Pellegrin, MD Follow up in 1 week(s).   Specialty:  Internal Medicine Contact information: [address] [place] [address] [phone]           Signed: Rosita Fire   12/30/2016, 8:18 AM

## 2016-12-30 NOTE — NC FL2 (Signed)
[place] LEVEL OF CARE SCREENING TOOL     IDENTIFICATION  Patient Name: Karina Clarke Birthdate: 06-24-1961 Sex: female Admission Date (Current Location): 12/26/2016  [place] and [place] Number:  [place] [id number] [place] and Address:  [facility] [address] [place]      Provider Number: [id number]  Attending Physician Name and Address:  Rosita Fire, MD  Relative Name and Phone Number:       Current Level of Care: Hospital Recommended Level of Care: [place] Prior Approval Number:    Date Approved/Denied:   PASRR Number: [id number]  Discharge Plan: Other (Comment) (Group home)    Current Diagnoses: Patient Active Problem List   Diagnosis Date Noted  . Influenza 12/26/2016  . Sepsis ([place]) 12/26/2016  . CAP (community acquired pneumonia) 12/26/2016  . Tobacco use disorder 12/26/2016  . HIV (human immunodeficiency virus infection) ([place]) 12/26/2016  . Schizophrenia ([place]) 12/26/2016  . Personality disorder 08/22/2016  . Encounter for screening colonoscopy 05/24/2015    Orientation RESPIRATION BLADDER Height & Weight     Place, Self  Normal Continent Weight: 244 lb 11.2 oz (111 kg) Height:  6\' 1"  (185.4 cm)  BEHAVIORAL SYMPTOMS/MOOD NEUROLOGICAL BOWEL NUTRITION STATUS  Other (Comment) (none)  (n/a) Continent Diet (Regular)  AMBULATORY STATUS COMMUNICATION OF NEEDS Skin   Supervision Verbally Normal                       Personal Care Assistance Level of Assistance  Bathing, Feeding, Dressing Bathing Assistance: Limited assistance Feeding assistance: Independent Dressing Assistance: Limited assistance     Functional Limitations Info  Sight, Hearing, Speech Sight Info: Adequate Hearing Info: Adequate Speech Info: Adequate    SPECIAL CARE FACTORS FREQUENCY                       Contractures      Additional Factors Info  Psychotropic Code Status Info: Full code Allergies  Info: Asa (Aspirin) Psychotropic Info: Depakote, Haldol, Seroquel   Isolation Precautions Info: Droplet precautions     Current Medications (12/30/2016):  This is the current hospital active medication list Current Facility-Administered Medications  Medication Dose Route Frequency Provider Last Rate Last Dose  . abacavir (ZIAGEN) tablet 600 mg  600 mg Oral Daily Rosita Fire, MD   600 mg at 12/29/16 0909   And  . lamiVUDine (EPIVIR) tablet 300 mg  300 mg Oral Daily Rosita Fire, MD   300 mg at 12/29/16 0909  . acetaminophen (TYLENOL) tablet 650 mg  650 mg Oral Q4H PRN Asencion Noble, MD   650 mg at 12/29/16 2116  . albuterol (PROVENTIL) (2.5 MG/3ML) 0.083% nebulizer solution 2.5 mg  2.5 mg Nebulization Q2H PRN Karmen Bongo, MD   2.5 mg at 12/26/16 1917  . amantadine (SYMMETREL) capsule 100 mg  100 mg Oral BID Karmen Bongo, MD   100 mg at 12/29/16 2115  . atazanavir (REYATAZ) capsule 400 mg  400 mg Oral Q breakfast Karmen Bongo, MD   400 mg at 12/29/16 0909  . atorvastatin (LIPITOR) tablet 10 mg  10 mg Oral BID Karmen Bongo, MD   10 mg at 12/29/16 2115  . azithromycin (ZITHROMAX) 500 mg in dextrose 5 % 250 mL IVPB  500 mg Intravenous Q24H Orvan Falconer, MD   500 mg at 12/29/16 1735  . cefTRIAXone (ROCEPHIN) 1 g in dextrose 5 % 50 mL IVPB  1 g Intravenous Q24H Orvan Falconer, MD  1 g at 12/29/16 1622  . divalproex (DEPAKOTE) DR tablet 500 mg  500 mg Oral 2 times per day Karmen Bongo, MD   500 mg at 12/29/16 2115  . enoxaparin (LOVENOX) injection 40 mg  40 mg Subcutaneous Q24H Karmen Bongo, MD   40 mg at 12/29/16 2116  . haloperidol (HALDOL) tablet 20 mg  20 mg Oral BID Karmen Bongo, MD   20 mg at 12/29/16 2115  . nicotine (NICODERM CQ - dosed in mg/24 hours) patch 14 mg  14 mg Transdermal Daily Karmen Bongo, MD   14 mg at 12/29/16 0912  . oseltamivir (TAMIFLU) capsule 75 mg  75 mg Oral BID Karmen Bongo, MD   75 mg at 12/29/16 2116  . oxyCODONE-acetaminophen (PERCOCET/ROXICET) 5-325 MG per  tablet 1-2 tablet  1-2 tablet Oral Q4H PRN Karmen Bongo, MD   1 tablet at 12/29/16 1253  . QUEtiapine (SEROQUEL) tablet 200 mg  200 mg Oral 2 times per day Karmen Bongo, MD   200 mg at 12/29/16 2115  . raltegravir (ISENTRESS) tablet 400 mg  400 mg Oral BID Karmen Bongo, MD   400 mg at 12/29/16 2115  . valACYclovir (VALTREX) tablet 1,000 mg  1,000 mg Oral Daily Karmen Bongo, MD   1,000 mg at 12/29/16 1610  . zolpidem (AMBIEN) tablet 5 mg  5 mg Oral QHS Karmen Bongo, MD   5 mg at 12/29/16 2116     Discharge Medications: Medication List    TAKE these medications   abacavir-lamiVUDine 600-300 MG tablet Commonly known as:  EPZICOM Take 1 tablet by mouth daily.   amantadine 100 MG capsule Commonly known as:  SYMMETREL Take 100 mg by mouth 2 (two) times daily.   amoxicillin-clavulanate 500-125 MG tablet Commonly known as:  AUGMENTIN Take 1 tablet (500 mg total) by mouth 3 (three) times daily.   atazanavir 200 MG capsule Commonly known as:  REYATAZ Take 400 mg by mouth daily.   atorvastatin 10 MG tablet Commonly known as:  LIPITOR Take 10 mg by mouth 2 (two) times daily.   divalproex 500 MG DR tablet Commonly known as:  DEPAKOTE Take 500 mg by mouth See admin instructions. Take 1 tablet (500 mg) by mouth at 4pm and 8pm daily   haloperidol 10 MG tablet Commonly known as:  HALDOL Take 20 mg by mouth 2 (two) times daily.   haloperidol decanoate 100 MG/ML injection Commonly known as:  HALDOL DECANOATE Inject 100 mg into the muscle every 28 (twenty-eight) days. Last injection 06/09/15   nicotine 14 mg/24hr patch Commonly known as:  NICODERM CQ - dosed in mg/24 hours Place 1 patch (14 mg total) onto the skin daily.   oseltamivir 75 MG capsule Commonly known as:  TAMIFLU Take 1 capsule (75 mg total) by mouth 2 (two) times daily.   oseltamivir 75 MG capsule Commonly known as:  TAMIFLU Take 1 capsule (75 mg total) by mouth every 12 (twelve) hours.    oxyCODONE-acetaminophen 5-325 MG tablet Commonly known as:  ROXICET Take 1-2 tablets by mouth every 4 (four) hours as needed.   QUEtiapine 200 MG tablet Commonly known as:  SEROQUEL Take 200 mg by mouth See admin instructions. Take 1 tablet (200 mg) by mouth with supper and at bedtime daily   raltegravir 400 MG tablet Commonly known as:  ISENTRESS Take 400 mg by mouth 2 (two) times daily.   valACYclovir 1000 MG tablet Commonly known as:  VALTREX Take 1,000 mg by mouth daily.   zolpidem  10 MG tablet Commonly known as:  AMBIEN Take 5 mg by mouth at bedtime.        Relevant Imaging Results:  Relevant Lab Results:   Additional Information    Salome Arnt, [place]

## 2016-12-30 NOTE — Progress Notes (Signed)
AVS reviewed with patient.  Verbalized understanding of discharge instructions, physician follow-up, medications (sent to pharmacy).  Patient to return to [place] today.  Patient's IV removed.  Site WNL.  Patient transported by NT via wheelchair to main entrance for discharge.  Patient stable at time of discharge.

## 2016-12-31 LAB — CULTURE, BLOOD (ROUTINE X 2)
CULTURE: NO GROWTH
Culture: NO GROWTH

## 2017-02-05 ENCOUNTER — Ambulatory Visit: Payer: Medicaid Other | Admitting: Orthopaedic Surgery

## 2017-02-12 ENCOUNTER — Ambulatory Visit (INDEPENDENT_AMBULATORY_CARE_PROVIDER_SITE_OTHER): Payer: Medicaid Other | Admitting: Orthopaedic Surgery

## 2017-02-12 VITALS — BP 106/77 | HR 120 | Ht 73.0 in | Wt 242.0 lb

## 2017-02-12 DIAGNOSIS — G8929 Other chronic pain: Secondary | ICD-10-CM | POA: Diagnosis not present

## 2017-02-12 DIAGNOSIS — F609 Personality disorder, unspecified: Secondary | ICD-10-CM

## 2017-02-12 DIAGNOSIS — M25561 Pain in right knee: Secondary | ICD-10-CM

## 2017-02-12 NOTE — Progress Notes (Signed)
CC:  I have pain of my right knee. I would like an injection.  The patient has chronic pain of the right knee.  There is no recent trauma.  There is no redness.  Injections in the past have helped.  The knee has no redness, has an effusion and crepitus present.  ROM of the right knee is 0-105.  Impression:  Chronic knee pain right  Return: 3 months  PROCEDURE NOTE:  The patient requests injections of the right knee , verbal consent was obtained.  The right knee was prepped appropriately after time out was performed.   Sterile technique was observed and injection of 1 cc of Depo-Medrol 40 mg with several cc's of plain xylocaine. Anesthesia was provided by ethyl chloride and a 20-gauge needle was used to inject the knee area. The injection was tolerated well.  A band aid dressing was applied.  The patient was advised to apply ice later today and tomorrow to the injection sight as needed.  Forms for rest home completed.  Electronically Signed Sanjuana Kava, MD 3/21/20182:58 PM

## 2017-05-05 IMAGING — US US BREAST LTD UNI LEFT INC AXILLA
1 series · 4 of 4 positions shown · non-contrast
Comparison: 04/05/2015

CLINICAL DATA: The patient returns after baseline screening exam
for evaluation of left breast calcifications.

EXAM:
DIGITAL DIAGNOSTIC LEFT MAMMOGRAM
ULTRASOUND LEFT BREAST

[Series 1: us breast ltd uni left inc axilla · 0.06mm/px · 4 of 4 slices shown]
[im 1/4]
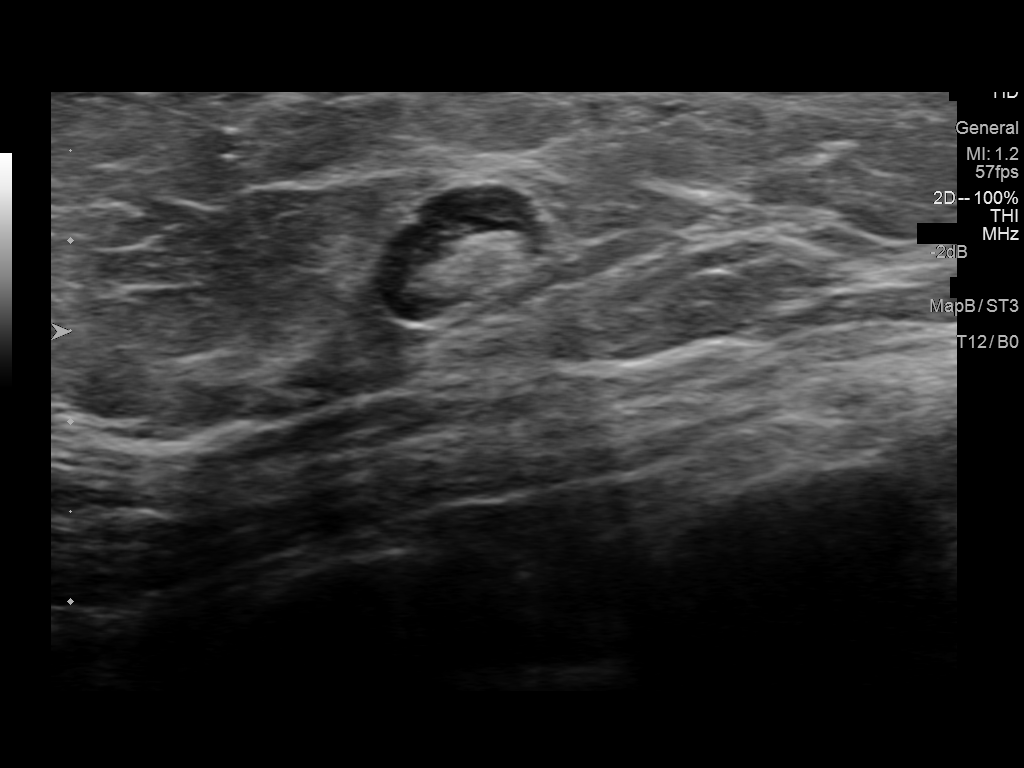
[im 2/4]
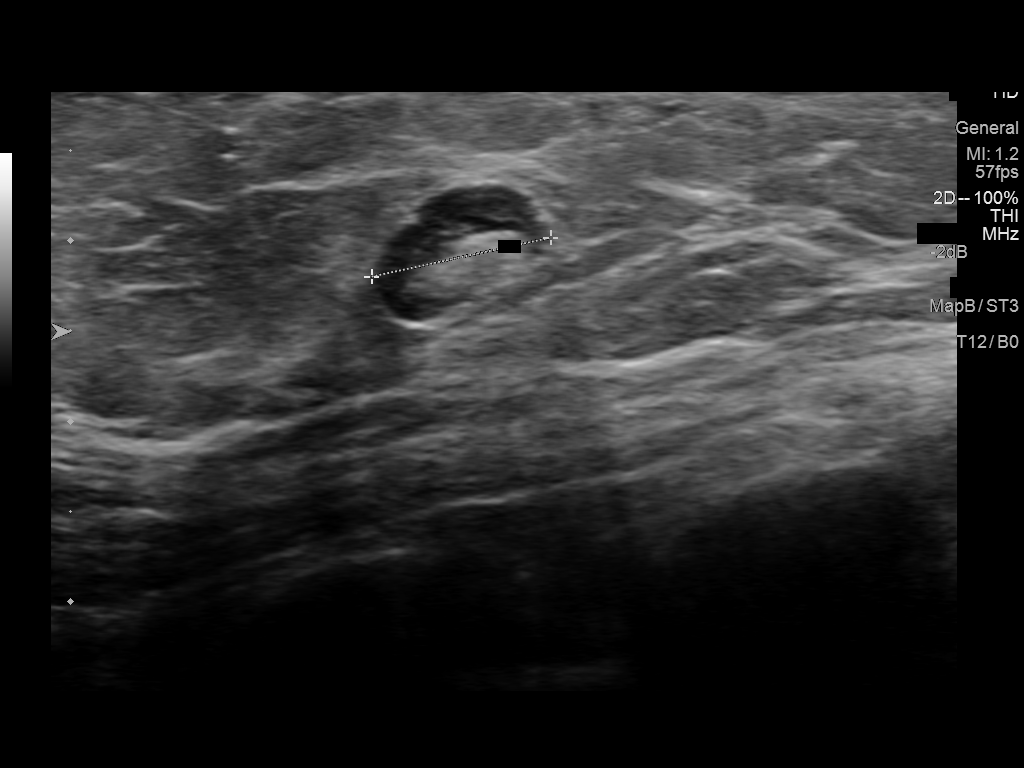
[im 3/4]
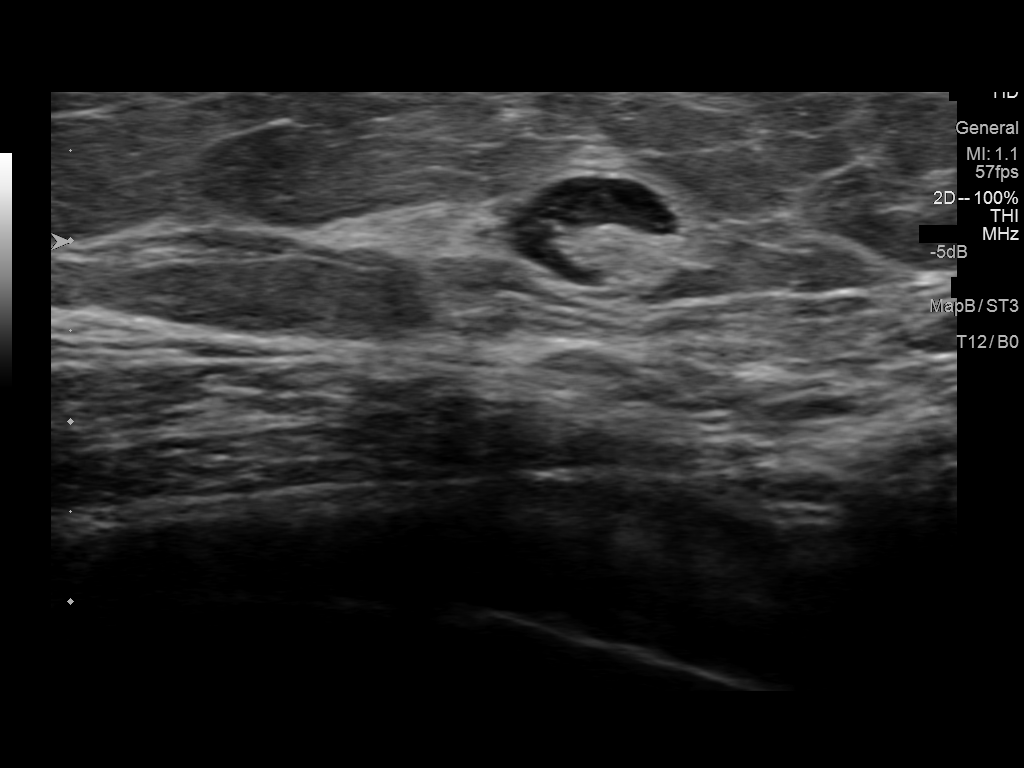
[im 4/4]
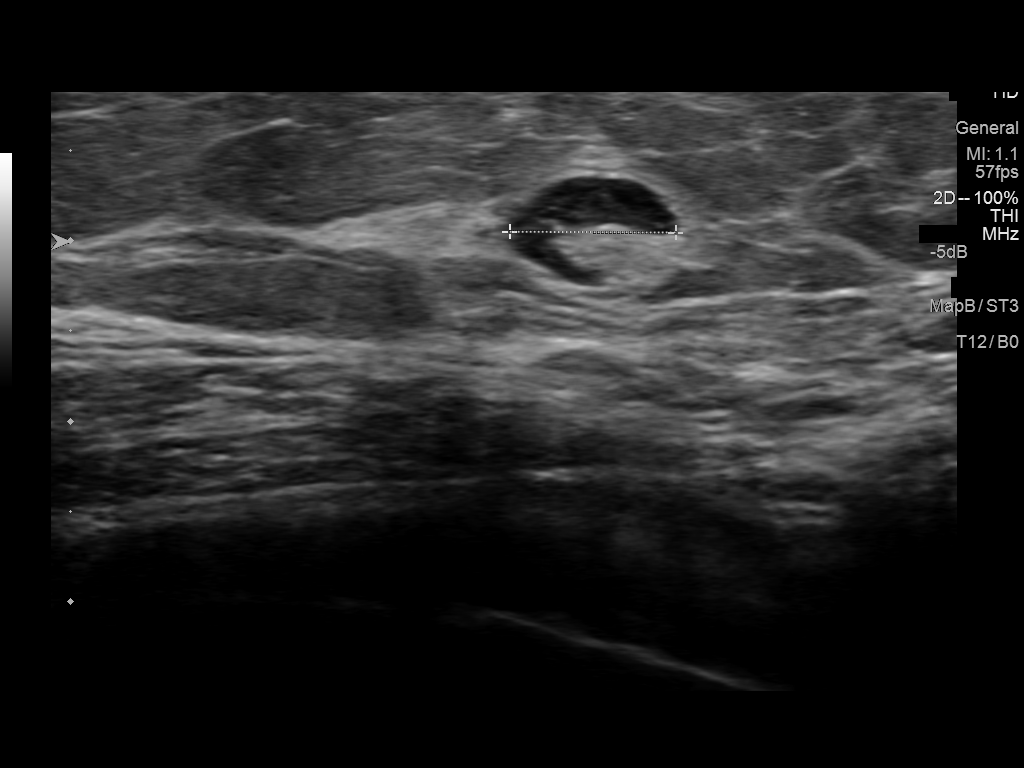

[4 of 4 positions shown; findings below may reference images not displayed]

ACR Breast Density Category c: The breast tissue is heterogeneously
dense, which may obscure small masses.
FINDINGS: Magnified views are performed of calcifications in the upper-outer
quadrant of the left breast. In this portion of the breast there is
a small group of calcifications which measures 0.5 x 0.7 x 0.6 cm.
The calcifications vary in size, shape, and density. Additionally a
minimally prominent lymph node is evaluated sonographically.

On physical exam, I palpate no abnormality in the lateral aspect of
the left breast.

Targeted ultrasound is performed, showing a superficial lymph node
in the 2 o'clock location of the left breast 14 cm from the nipple.
Normal fatty hilum is demonstrated. No suspicious features
identified.
IMPRESSION: 1. Indeterminate left breast calcifications. Tissue diagnosis is
recommended.
2. Normal appearing intramammary lymph node left breast 2 o'clock
location.

RECOMMENDATION:
Stereotactic guided core biopsy is recommended and will be scheduled
for the patient.

I have discussed the findings and recommendations with the patient
and her caregiver Mrs. Tiger. The patient's sister (not present)
is power of attorney and guardian. I suggested that the patient's
sister come with her to the biopsy. Results were also provided in
writing at the conclusion of the visit. If applicable, a reminder
letter will be sent to the patient regarding the next appointment.

BI-RADS CATEGORY  4: Suspicious.

## 2017-05-14 ENCOUNTER — Encounter: Payer: Self-pay | Admitting: Orthopaedic Surgery

## 2017-05-14 ENCOUNTER — Ambulatory Visit (INDEPENDENT_AMBULATORY_CARE_PROVIDER_SITE_OTHER): Payer: Medicaid Other | Admitting: Orthopaedic Surgery

## 2017-05-14 DIAGNOSIS — G8929 Other chronic pain: Secondary | ICD-10-CM | POA: Diagnosis not present

## 2017-05-14 DIAGNOSIS — F609 Personality disorder, unspecified: Secondary | ICD-10-CM

## 2017-05-14 DIAGNOSIS — M25561 Pain in right knee: Secondary | ICD-10-CM | POA: Diagnosis not present

## 2017-05-14 NOTE — Progress Notes (Signed)
CC:  I have pain of my right knee. I would like an injection.  The patient has chronic pain of the right knee.  There is no recent trauma.  There is no redness.  Injections in the past have helped.  The knee has no redness, has an effusion and crepitus present.  ROM of the right knee is 0-100.  Impression:  Chronic knee pain right  Return: 3 months  PROCEDURE NOTE:  The patient requests injections of the right knee , verbal consent was obtained.  The right knee was prepped appropriately after time out was performed.   Sterile technique was observed and injection of 1 cc of Depo-Medrol 40 mg with several cc's of plain xylocaine. Anesthesia was provided by ethyl chloride and a 20-gauge needle was used to inject the knee area. The injection was tolerated well.  A band aid dressing was applied.  The patient was advised to apply ice later today and tomorrow to the injection sight as needed.  Encounter Diagnoses  Name Primary?  . Chronic pain of right knee Yes  . Personality disorder    Call if any problem.  Precautions discussed.   Electronically Signed Sanjuana Kava, MD 6/20/20182:44 PM

## 2017-08-08 ENCOUNTER — Inpatient Hospital Stay (HOSPITAL_COMMUNITY)
Admission: EM | Admit: 2017-08-08 | Discharge: 2017-08-10 | DRG: 871 | Disposition: A | Discharge status: Home / Self Care | Payer: Medicaid Other | Attending: Internal Medicine | Admitting: Internal Medicine

## 2017-08-08 ENCOUNTER — Encounter (HOSPITAL_COMMUNITY): Payer: Self-pay | Admitting: Emergency Medicine

## 2017-08-08 ENCOUNTER — Emergency Department (HOSPITAL_COMMUNITY): Payer: Medicaid Other

## 2017-08-08 DIAGNOSIS — F200 Paranoid schizophrenia: Secondary | ICD-10-CM | POA: Diagnosis present

## 2017-08-08 DIAGNOSIS — E7800 Pure hypercholesterolemia, unspecified: Secondary | ICD-10-CM | POA: Diagnosis present

## 2017-08-08 DIAGNOSIS — R6520 Severe sepsis without septic shock: Secondary | ICD-10-CM

## 2017-08-08 DIAGNOSIS — F70 Mild intellectual disabilities: Secondary | ICD-10-CM | POA: Diagnosis present

## 2017-08-08 DIAGNOSIS — Z21 Asymptomatic human immunodeficiency virus [HIV] infection status: Secondary | ICD-10-CM | POA: Diagnosis present

## 2017-08-08 DIAGNOSIS — B20 Human immunodeficiency virus [HIV] disease: Secondary | ICD-10-CM | POA: Diagnosis not present

## 2017-08-08 DIAGNOSIS — E876 Hypokalemia: Secondary | ICD-10-CM | POA: Diagnosis present

## 2017-08-08 DIAGNOSIS — A419 Sepsis, unspecified organism: Secondary | ICD-10-CM | POA: Diagnosis not present

## 2017-08-08 DIAGNOSIS — F17210 Nicotine dependence, cigarettes, uncomplicated: Secondary | ICD-10-CM | POA: Diagnosis present

## 2017-08-08 DIAGNOSIS — A408 Other streptococcal sepsis: Secondary | ICD-10-CM | POA: Diagnosis not present

## 2017-08-08 DIAGNOSIS — F17200 Nicotine dependence, unspecified, uncomplicated: Secondary | ICD-10-CM

## 2017-08-08 DIAGNOSIS — J189 Pneumonia, unspecified organism: Secondary | ICD-10-CM | POA: Diagnosis present

## 2017-08-08 DIAGNOSIS — J181 Lobar pneumonia, unspecified organism: Secondary | ICD-10-CM

## 2017-08-08 DIAGNOSIS — Z79899 Other long term (current) drug therapy: Secondary | ICD-10-CM | POA: Diagnosis not present

## 2017-08-08 DIAGNOSIS — F209 Schizophrenia, unspecified: Secondary | ICD-10-CM | POA: Diagnosis present

## 2017-08-08 DIAGNOSIS — Z886 Allergy status to analgesic agent status: Secondary | ICD-10-CM | POA: Diagnosis not present

## 2017-08-08 DIAGNOSIS — I10 Essential (primary) hypertension: Secondary | ICD-10-CM | POA: Diagnosis present

## 2017-08-08 HISTORY — DX: Essential (primary) hypertension: I10

## 2017-08-08 LAB — URINALYSIS, ROUTINE W REFLEX MICROSCOPIC
Bilirubin Urine: NEGATIVE
Glucose, UA: NEGATIVE mg/dL
HGB URINE DIPSTICK: NEGATIVE
Ketones, ur: NEGATIVE mg/dL
LEUKOCYTES UA: NEGATIVE
Nitrite: NEGATIVE
Protein, ur: NEGATIVE mg/dL
SPECIFIC GRAVITY, URINE: 1.012 (ref 1.005–1.030)
pH: 5 (ref 5.0–8.0)

## 2017-08-08 LAB — CBC WITH DIFFERENTIAL/PLATELET
BASOS ABS: 0 10*3/uL (ref 0.0–0.1)
Basophils Relative: 0 %
EOS ABS: 0.1 10*3/uL (ref 0.0–0.7)
Eosinophils Relative: 1 %
HCT: 42.4 % (ref 36.0–46.0)
HEMOGLOBIN: 13.9 g/dL (ref 12.0–15.0)
LYMPHS ABS: 2.3 10*3/uL (ref 0.7–4.0)
Lymphocytes Relative: 29 %
MCH: 31.3 pg (ref 26.0–34.0)
MCHC: 32.8 g/dL (ref 30.0–36.0)
MCV: 95.5 fL (ref 78.0–100.0)
Monocytes Absolute: 1 10*3/uL (ref 0.1–1.0)
Monocytes Relative: 13 %
NEUTROS PCT: 57 %
Neutro Abs: 4.4 10*3/uL (ref 1.7–7.7)
Platelets: 127 10*3/uL — ABNORMAL LOW (ref 150–400)
RBC: 4.44 MIL/uL (ref 3.87–5.11)
RDW: 15.8 % — ABNORMAL HIGH (ref 11.5–15.5)
WBC: 7.8 10*3/uL (ref 4.0–10.5)

## 2017-08-08 LAB — COMPREHENSIVE METABOLIC PANEL
ALT: 21 U/L (ref 14–54)
AST: 24 U/L (ref 15–41)
Albumin: 4 g/dL (ref 3.5–5.0)
Alkaline Phosphatase: 80 U/L (ref 38–126)
Anion gap: 11 (ref 5–15)
BUN: 11 mg/dL (ref 6–20)
CHLORIDE: 102 mmol/L (ref 101–111)
CO2: 27 mmol/L (ref 22–32)
CREATININE: 1.34 mg/dL — AB (ref 0.44–1.00)
Calcium: 9.3 mg/dL (ref 8.9–10.3)
GFR, EST AFRICAN AMERICAN: 51 mL/min — AB (ref >60)
GFR, EST NON AFRICAN AMERICAN: 44 mL/min — AB (ref >60)
Glucose, Bld: 113 mg/dL — ABNORMAL HIGH (ref 65–99)
POTASSIUM: 3.1 mmol/L — AB (ref 3.5–5.1)
Sodium: 140 mmol/L (ref 135–145)
Total Bilirubin: 1.2 mg/dL (ref 0.3–1.2)
Total Protein: 8.2 g/dL — ABNORMAL HIGH (ref 6.5–8.1)

## 2017-08-08 LAB — APTT: aPTT: 30 seconds (ref 24–36)

## 2017-08-08 LAB — I-STAT CHEM 8, ED
BUN: 10 mg/dL (ref 6–20)
Calcium, Ion: 1.18 mmol/L (ref 1.15–1.40)
Chloride: 101 mmol/L (ref 101–111)
Creatinine, Ser: 1.3 mg/dL — ABNORMAL HIGH (ref 0.44–1.00)
Glucose, Bld: 111 mg/dL — ABNORMAL HIGH (ref 65–99)
HEMATOCRIT: 45 % (ref 36.0–46.0)
HEMOGLOBIN: 15.3 g/dL — AB (ref 12.0–15.0)
POTASSIUM: 3.2 mmol/L — AB (ref 3.5–5.1)
SODIUM: 142 mmol/L (ref 135–145)
TCO2: 28 mmol/L (ref 22–32)

## 2017-08-08 LAB — LACTIC ACID, PLASMA
Lactic Acid, Venous: 1.1 mmol/L (ref 0.5–1.9)
Lactic Acid, Venous: 0.9 mmol/L (ref 0.5–1.9)

## 2017-08-08 LAB — PROCALCITONIN: Procalcitonin: 0.25 ng/mL

## 2017-08-08 LAB — MAGNESIUM: Magnesium: 1.8 mg/dL (ref 1.7–2.4)

## 2017-08-08 LAB — PROTIME-INR
INR: 1.1
PROTHROMBIN TIME: 14.1 s (ref 11.4–15.2)

## 2017-08-08 LAB — INFLUENZA PANEL BY PCR (TYPE A & B)
INFLAPCR: NEGATIVE
INFLBPCR: NEGATIVE

## 2017-08-08 LAB — CBG MONITORING, ED: GLUCOSE-CAPILLARY: 107 mg/dL — AB (ref 65–99)

## 2017-08-08 LAB — TROPONIN I

## 2017-08-08 LAB — I-STAT CG4 LACTIC ACID, ED: Lactic Acid, Venous: 2.45 mmol/L (ref 0.5–1.9)

## 2017-08-08 LAB — MRSA PCR SCREENING: MRSA by PCR: NEGATIVE

## 2017-08-08 LAB — VALPROIC ACID LEVEL: VALPROIC ACID LVL: 13 ug/mL — AB (ref 50.0–100.0)

## 2017-08-08 MED ORDER — PIPERACILLIN-TAZOBACTAM 3.375 G IVPB 30 MIN
3.3750 g | Freq: Once | INTRAVENOUS | Status: AC
  Administered 2017-08-08: 3.375 g via INTRAVENOUS
  Filled 2017-08-08: qty 50

## 2017-08-08 MED ORDER — PIPERACILLIN-TAZOBACTAM 3.375 G IVPB
3.3750 g | Freq: Three times a day (TID) | INTRAVENOUS | Status: DC
  Administered 2017-08-08 – 2017-08-10 (×6): 3.375 g via INTRAVENOUS
  Filled 2017-08-08 (×6): qty 50

## 2017-08-08 MED ORDER — ATAZANAVIR SULFATE 200 MG PO CAPS: 400.0000 mg | ORAL_CAPSULE | Freq: Every day | ORAL | Status: DC

## 2017-08-08 MED ORDER — ONDANSETRON HCL 4 MG/2ML IJ SOLN: 4.0000 mg | Freq: Four times a day (QID) | INTRAMUSCULAR | Status: DC | PRN

## 2017-08-08 MED ORDER — ZOLPIDEM TARTRATE 5 MG PO TABS
5.0000 mg | ORAL_TABLET | Freq: Every day | ORAL | Status: DC
  Administered 2017-08-08 – 2017-08-09 (×2): 5 mg via ORAL
  Filled 2017-08-08 (×2): qty 1

## 2017-08-08 MED ORDER — ALBUTEROL SULFATE (2.5 MG/3ML) 0.083% IN NEBU: 2.5000 mg | INHALATION_SOLUTION | Freq: Two times a day (BID) | RESPIRATORY_TRACT | Status: DC | PRN

## 2017-08-08 MED ORDER — VANCOMYCIN HCL 10 G IV SOLR
2000.0000 mg | Freq: Once | INTRAVENOUS | Status: AC
  Administered 2017-08-08: 2000 mg via INTRAVENOUS
  Filled 2017-08-08: qty 2000

## 2017-08-08 MED ORDER — ATAZANAVIR SULFATE 200 MG PO CAPS
400.0000 mg | ORAL_CAPSULE | Freq: Every day | ORAL | Status: DC
  Administered 2017-08-09 – 2017-08-10 (×2): 400 mg via ORAL
  Filled 2017-08-08 (×3): qty 2

## 2017-08-08 MED ORDER — ACETAMINOPHEN 325 MG PO TABS: 650.0000 mg | ORAL_TABLET | Freq: Four times a day (QID) | ORAL | Status: DC | PRN

## 2017-08-08 MED ORDER — ABACAVIR SULFATE-LAMIVUDINE 600-300 MG PO TABS
1.0000 | ORAL_TABLET | Freq: Every day | ORAL | Status: DC
  Filled 2017-08-08 (×3): qty 1

## 2017-08-08 MED ORDER — VANCOMYCIN HCL IN DEXTROSE 1-5 GM/200ML-% IV SOLN
1000.0000 mg | Freq: Two times a day (BID) | INTRAVENOUS | Status: DC
  Administered 2017-08-09 – 2017-08-10 (×3): 1000 mg via INTRAVENOUS
  Filled 2017-08-08 (×3): qty 200

## 2017-08-08 MED ORDER — QUETIAPINE FUMARATE 100 MG PO TABS
200.0000 mg | ORAL_TABLET | Freq: Two times a day (BID) | ORAL | Status: DC
  Administered 2017-08-08 – 2017-08-09 (×4): 200 mg via ORAL
  Filled 2017-08-08 (×2): qty 1
  Filled 2017-08-08: qty 2
  Filled 2017-08-08 (×4): qty 1
  Filled 2017-08-08: qty 2

## 2017-08-08 MED ORDER — LAMIVUDINE 150 MG PO TABS
300.0000 mg | ORAL_TABLET | Freq: Every day | ORAL | Status: DC
  Administered 2017-08-09 – 2017-08-10 (×2): 300 mg via ORAL
  Filled 2017-08-08 (×3): qty 2

## 2017-08-08 MED ORDER — NICOTINE 14 MG/24HR TD PT24
14.0000 mg | MEDICATED_PATCH | Freq: Every day | TRANSDERMAL | Status: DC
  Administered 2017-08-09 – 2017-08-10 (×2): 14 mg via TRANSDERMAL
  Filled 2017-08-08 (×2): qty 1

## 2017-08-08 MED ORDER — METHYLPREDNISOLONE SODIUM SUCC 125 MG IJ SOLR
125.0000 mg | Freq: Once | INTRAMUSCULAR | Status: AC
  Administered 2017-08-08: 125 mg via INTRAVENOUS
  Filled 2017-08-08: qty 2

## 2017-08-08 MED ORDER — SODIUM CHLORIDE 0.9 % IV BOLUS (SEPSIS)
1000.0000 mL | Freq: Once | INTRAVENOUS | Status: AC
  Administered 2017-08-08: 1000 mL via INTRAVENOUS

## 2017-08-08 MED ORDER — HYDROXYZINE HCL 25 MG PO TABS
25.0000 mg | ORAL_TABLET | Freq: Every day | ORAL | Status: DC
  Administered 2017-08-08 – 2017-08-09 (×2): 25 mg via ORAL
  Filled 2017-08-08 (×2): qty 1

## 2017-08-08 MED ORDER — PIPERACILLIN-TAZOBACTAM 3.375 G IVPB 30 MIN: 3.3750 g | Freq: Once | INTRAVENOUS | Status: DC

## 2017-08-08 MED ORDER — SODIUM CHLORIDE 0.9 % IV BOLUS (SEPSIS)
500.0000 mL | Freq: Once | INTRAVENOUS | Status: AC
  Administered 2017-08-08: 500 mL via INTRAVENOUS

## 2017-08-08 MED ORDER — VANCOMYCIN HCL IN DEXTROSE 1-5 GM/200ML-% IV SOLN
1000.0000 mg | Freq: Once | INTRAVENOUS | Status: DC
  Filled 2017-08-08: qty 200

## 2017-08-08 MED ORDER — ABACAVIR SULFATE 300 MG PO TABS
600.0000 mg | ORAL_TABLET | Freq: Every day | ORAL | Status: DC
  Administered 2017-08-09 – 2017-08-10 (×2): 600 mg via ORAL
  Filled 2017-08-08 (×3): qty 2

## 2017-08-08 MED ORDER — DIVALPROEX SODIUM 250 MG PO DR TAB
500.0000 mg | DELAYED_RELEASE_TABLET | Freq: Two times a day (BID) | ORAL | Status: DC
Start: 2017-08-08 — End: 2017-08-10
  Administered 2017-08-08 – 2017-08-09 (×4): 500 mg via ORAL
  Filled 2017-08-08 (×4): qty 2

## 2017-08-08 MED ORDER — RALTEGRAVIR POTASSIUM 400 MG PO TABS
400.0000 mg | ORAL_TABLET | Freq: Two times a day (BID) | ORAL | Status: DC
  Administered 2017-08-08 – 2017-08-10 (×4): 400 mg via ORAL
  Filled 2017-08-08 (×6): qty 1

## 2017-08-08 MED ORDER — LEVALBUTEROL HCL 0.63 MG/3ML IN NEBU
0.6300 mg | INHALATION_SOLUTION | Freq: Once | RESPIRATORY_TRACT | Status: AC
  Administered 2017-08-08: 0.63 mg via RESPIRATORY_TRACT
  Filled 2017-08-08: qty 3

## 2017-08-08 MED ORDER — ALBUTEROL SULFATE HFA 108 (90 BASE) MCG/ACT IN AERS: 1.0000 | INHALATION_SPRAY | Freq: Two times a day (BID) | RESPIRATORY_TRACT | Status: DC | PRN

## 2017-08-08 MED ORDER — HYDROXYZINE PAMOATE 25 MG PO CAPS
25.0000 mg | ORAL_CAPSULE | Freq: Every day | ORAL | Status: DC
  Filled 2017-08-08 (×3): qty 1

## 2017-08-08 MED ORDER — ATORVASTATIN CALCIUM 10 MG PO TABS
10.0000 mg | ORAL_TABLET | Freq: Two times a day (BID) | ORAL | Status: DC
  Administered 2017-08-08 – 2017-08-10 (×4): 10 mg via ORAL
  Filled 2017-08-08 (×4): qty 1

## 2017-08-08 MED ORDER — METHYLPREDNISOLONE SODIUM SUCC 125 MG IJ SOLR
60.0000 mg | Freq: Four times a day (QID) | INTRAMUSCULAR | Status: DC
  Administered 2017-08-08 – 2017-08-10 (×8): 60 mg via INTRAVENOUS
  Filled 2017-08-08 (×8): qty 2

## 2017-08-08 MED ORDER — POTASSIUM CHLORIDE IN NACL 40-0.9 MEQ/L-% IV SOLN
INTRAVENOUS | Status: DC
  Administered 2017-08-08 – 2017-08-09 (×2): 75 mL/h via INTRAVENOUS

## 2017-08-08 MED ORDER — LEVALBUTEROL HCL 1.25 MG/0.5ML IN NEBU
1.2500 mg | INHALATION_SOLUTION | Freq: Once | RESPIRATORY_TRACT | Status: AC
  Administered 2017-08-08: 1.25 mg via RESPIRATORY_TRACT
  Filled 2017-08-08: qty 0.5

## 2017-08-08 MED ORDER — VALACYCLOVIR HCL 500 MG PO TABS
1000.0000 mg | ORAL_TABLET | Freq: Every day | ORAL | Status: DC
  Administered 2017-08-09 – 2017-08-10 (×2): 1000 mg via ORAL
  Filled 2017-08-08 (×4): qty 2

## 2017-08-08 NOTE — H&P (Addendum)
[facility] History and Physical  Ashlyn Cabler RKY:[id number] DOB: Jun 06, 1961 DOA: 08/08/2017  Referring physician:   PCP: Rosita Fire, MD   Chief Complaint:   Intractable nausea vomiting HPI:   56 year old female with a history of HIV, disability, schizophrenia, polysubstance abuse, dyslipidemia  , smoker followed by infectious disease at [facility], who presents to ED today because of one day's worth of productive cough, subjective fever, chills, nausea with one episode of vomiting last night. Patient had also received her insurance to vaccine yesterday. Patient is a resident of group home and is brought in by her caretaker. ED course BP (!) 109/97   Pulse (!) 145   Temp 99.3 F (37.4 C)   Resp 18   Ht 5\' 11"  (1.803 m)   Wt 108.9 kg (240 lb)   SpO2 95%   BMI 33.47 kg/m  Initially hypertensive with systolic blood pressure in the 80s, lactic acid 2.45, potassium 3.2, creatinine 1.3, chest x-ray opacity in the right base. Patient admitted for possible sepsis physiology due to right lower lobe pneumonia.    Review of Systems: negative for the following  Constitutional: Positive for chills, diaphoresis, fatigue and fever.  Respiratory: Positive for cough, shortness of breath and wheezing.   Cardiovascular: Negative for chest pain, palpitations and leg swelling.  Gastrointestinal: Positive for nausea and vomiting. Gastrointestinal: Denies nausea, vomiting, abdominal pain, diarrhea, constipation, blood in stool and abdominal distention.  Genitourinary: Denies dysuria, urgency, frequency, hematuria, flank pain and difficulty urinating.  Musculoskeletal: Denies myalgias, back pain, joint swelling, arthralgias and gait problem.  Skin: Denies pallor, rash and wound.  Neurological: Denies dizziness, seizures, syncope, weakness, light-headedness, numbness and headaches.  Hematological: Denies adenopathy. Easy bruising, personal or family bleeding history  Psychiatric/Behavioral:  Denies suicidal ideation, mood changes, confusion, nervousness, sleep disturbance and agitation       Past Medical History:  Diagnosis Date  . 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 Bloating    per sister  . Chronic right hip pain    sister states circulation problem in hip  . Family history of adverse reaction to anesthesia    sister has hx. of post-op N/V  . High cholesterol   . HIV (human immunodeficiency virus infection) ([place])   . Hypertension   . Limp   . Mild intellectual disability   . No natural teeth    does not wear her dentures  . Paranoid schizophrenia ([place])   . Personality disorder   . Schizophrenia [facility])      Past Surgical History:  Procedure Laterality Date  . BREAST LUMPECTOMY WITH NEEDLE LOCALIZATION Left 07/28/2015   Procedure: BREAST LUMPECTOMY WITH NEEDLE LOCALIZATION;  Surgeon: Autumn Messing III, MD;  Location: [place];  Service: General;  Laterality: Left;  . COLONOSCOPY WITH PROPOFOL N/A 06/13/2015   SLF: 1. the left colon is redundant 2. the examination was otherwise normal 3. small internal hemorrhoids 4. moderate sized external hemorrhoids  . HEMORRHOID SURGERY    . UMBILICAL HERNIA REPAIR        Social History:  reports that she has been smoking Cigarettes.  She has a 18.00 pack-year smoking history. She has never used smokeless tobacco. She reports that she does not drink alcohol or use drugs.    Allergies  Allergen Reactions  . Asa [Aspirin] Nausea And Vomiting    Family History  Problem Relation Age of Onset  . Anesthesia problems Sister        post-op N/V  Prior to Admission medications   Medication Sig Start Date End Date Taking? Authorizing Provider  abacavir-lamiVUDine (EPZICOM) 600-300 MG per tablet Take 1 tablet by mouth daily.   Yes [provider]  albuterol (PROVENTIL HFA;VENTOLIN HFA) 108 (90 Base) MCG/ACT inhaler Inhale 1-2 puffs into the lungs 2 (two) times daily as needed for  wheezing or shortness of breath.   Yes [provider]  atazanavir (REYATAZ) 200 MG capsule Take 400 mg by mouth daily.    Yes [provider]  atorvastatin (LIPITOR) 10 MG tablet Take 10 mg by mouth 2 (two) times daily.   Yes [provider]  divalproex (DEPAKOTE) 500 MG DR tablet Take 500 mg by mouth See admin instructions. Take 1 tablet (500 mg) by mouth at 4pm and 8pm daily   Yes [provider]  haloperidol (HALDOL) 10 MG tablet Take 20 mg by mouth 2 (two) times daily.   Yes [provider]  haloperidol decanoate (HALDOL DECANOATE) 100 MG/ML injection Inject 100 mg into the muscle every 28 (twenty-eight) days. Last injection 06/09/15   Yes [provider]  hydrOXYzine (VISTARIL) 25 MG capsule Take 25 mg by mouth at bedtime.    Yes [provider]  ibuprofen (ADVIL,MOTRIN) 800 MG tablet Take 800 mg by mouth every 8 (eight) hours as needed for mild pain.   Yes [provider]  nicotine (NICODERM CQ - DOSED IN MG/24 HOURS) 14 mg/24hr patch Place 1 patch (14 mg total) onto the skin daily. 12/30/16  Yes Rosita Fire, MD  QUEtiapine (SEROQUEL) 200 MG tablet Take 200 mg by mouth See admin instructions. Take 1 tablet (200 mg) by mouth with supper and at bedtime daily   Yes [provider]  raltegravir (ISENTRESS) 400 MG tablet Take 400 mg by mouth 2 (two) times daily.   Yes [provider]  valACYclovir (VALTREX) 1000 MG tablet Take 1,000 mg by mouth daily.    Yes [provider]  zolpidem (AMBIEN) 10 MG tablet Take 5 mg by mouth at bedtime.    Yes [provider]     Physical Exam: Vitals:   08/08/17 1213 08/08/17 1230 08/08/17 1300 08/08/17 1330  BP:  120/72 136/83 (!) 145/75  Pulse:  (!) 117 (!) 115 (!) 107  Resp:  (!) 24 (!) 25 (!) 43  Temp:      SpO2: 95% 91% 92% (!) 82%  Weight:      Height:            Vitals:   08/08/17 1213 08/08/17 1230 08/08/17 1300 08/08/17 1330  BP:   120/72 136/83 (!) 145/75  Pulse:  (!) 117 (!) 115 (!) 107  Resp:  (!) 24 (!) 25 (!) 43  Temp:      SpO2: 95% 91% 92% (!) 82%  Weight:      Height:       Constitutional: NAD, calm, comfortable Eyes: PERRL, lids and conjunctivae normal ENMT: Mucous membranes are moist. Posterior pharynx clear of any exudate or lesions.Normal dentition.  Neck: normal, supple, no masses, no thyromegaly Pulmonary/Chest: She has wheezes.  Diminished breath sounds in the left lung field. There is expiratory wheezing throughout.  Cardiovascular: Regular rate and rhythm, no murmurs / rubs / gallops. No extremity edema. 2+ pedal pulses. No carotid bruits.  Abdomen: no tenderness, no masses palpated. No hepatosplenomegaly. Bowel sounds positive.  Musculoskeletal: no clubbing / cyanosis. No joint deformity upper and lower extremities. Good ROM, no contractures. Normal muscle tone.  Skin:  no rashes, lesions, ulcers. No induration Neurologic: CN 2-12 grossly intact. Sensation intact, DTR normal. Strength 5/5 in all 4.  Psychiatric: Normal judgment and insight. Alert and oriented x 3. Normal mood.     Labs on Admission: I have personally reviewed following labs and imaging studies  CBC:  Recent Labs Lab 08/08/17 1148 08/08/17 1159  WBC 7.8  --   NEUTROABS 4.4  --   HGB 13.9 15.3*  HCT 42.4 45.0  MCV 95.5  --   PLT 127*  --     Basic Metabolic Panel:  Recent Labs Lab 08/08/17 1148 08/08/17 1159  NA 140 142  K 3.1* 3.2*  CL 102 101  CO2 27  --   GLUCOSE 113* 111*  BUN 11 10  CREATININE 1.34* 1.30*  CALCIUM 9.3  --     GFR: Estimated Creatinine Clearance: 67.2 mL/min (A) (by C-G formula based on SCr of 1.3 mg/dL (H)).  Liver Function Tests:  Recent Labs Lab 08/08/17 1148  AST 24  ALT 21  ALKPHOS 80  BILITOT 1.2  PROT 8.2*  ALBUMIN 4.0   No results for input(s): LIPASE, AMYLASE in the last 168 hours. No results for input(s): AMMONIA in the last 168 hours.  Coagulation Profile: No  results for input(s): INR, PROTIME in the last 168 hours. No results for input(s): DDIMER in the last 72 hours.  Cardiac Enzymes:  Recent Labs Lab 08/08/17 1210  TROPONINI <0.03    BNP (last 3 results) No results for input(s): PROBNP in the last 8760 hours.  HbA1C: No results for input(s): HGBA1C in the last 72 hours. No results found for: HGBA1C   CBG:  Recent Labs Lab 08/08/17 1147  GLUCAP 107*    Lipid Profile: No results for input(s): CHOL, HDL, LDLCALC, TRIG, CHOLHDL, LDLDIRECT in the last 72 hours.  Thyroid Function Tests: No results for input(s): TSH, T4TOTAL, FREET4, T3FREE, THYROIDAB in the last 72 hours.  Anemia Panel: No results for input(s): VITAMINB12, FOLATE, FERRITIN, TIBC, IRON, RETICCTPCT in the last 72 hours.  Urine analysis: No results found for: COLORURINE, APPEARANCEUR, LABSPEC, PHURINE, GLUCOSEU, HGBUR, BILIRUBINUR, KETONESUR, PROTEINUR, UROBILINOGEN, NITRITE, LEUKOCYTESUR  Sepsis Labs: @LABRCNTIP (procalcitonin:4,lacticidven:4) ) Recent Results (from the past 240 hour(s))  Blood Culture (routine x 2)     Status: None (Preliminary result)   Collection Time: 08/08/17 11:51 AM  Result Value Ref Range Status   Specimen Description BLOOD RIGHT ARM  Final   Special Requests   Final    BOTTLES DRAWN AEROBIC AND ANAEROBIC Blood Culture adequate volume   Culture PENDING  Incomplete   Report Status PENDING  Incomplete  Blood Culture (routine x 2)     Status: None (Preliminary result)   Collection Time: 08/08/17 11:51 AM  Result Value Ref Range Status   Specimen Description BLOOD LEFT HAND  Final   Special Requests   Final    BOTTLES DRAWN AEROBIC ONLY Blood Culture adequate volume   Culture PENDING  Incomplete   Report Status PENDING  Incomplete         Radiological Exams on Admission: Dg Chest Port 1 View  Result Date: 08/08/2017 CLINICAL DATA:  Cough and chest pain. EXAM: PORTABLE CHEST 1 VIEW COMPARISON:  June 22, 2013 and December 26, 2016 FINDINGS: No pneumothorax. Stable mild cardiomegaly. Mild prominence the right hilum is likely due to patient rotation. This finding is not significantly changed since 2014. No nodules or masses. There is increased opacity in the right base. No overt edema.  IMPRESSION: Increasing opacity in the right base. This could be better evaluated with a PA and lateral chest x-ray. Developing pneumonia is not excluded. Electronically Signed   By: Dorise Bullion III M.D   On: 08/08/2017 12:20   Dg Chest Port 1 View  Result Date: 08/08/2017 CLINICAL DATA:  Cough and chest pain. EXAM: PORTABLE CHEST 1 VIEW COMPARISON:  June 22, 2013 and December 26, 2016 FINDINGS: No pneumothorax. Stable mild cardiomegaly. Mild prominence the right hilum is likely due to patient rotation. This finding is not significantly changed since 2014. No nodules or masses. There is increased opacity in the right base. No overt edema. IMPRESSION: Increasing opacity in the right base. This could be better evaluated with a PA and lateral chest x-ray. Developing pneumonia is not excluded. Electronically Signed   By: Dorise Bullion III M.D   On: 08/08/2017 12:20      EKG: Independently reviewed   Assessment/Plan Sepsis resulting from  CAP /aspiration pneumonia -Fever, tachycardia with elevated lactate to 2.45 -Sepsis protocol initiated in the ER  Influenza panel negative -Possibly has CAP vs aspiration - Broad-spectrum antibiotics as the patient lives in a group home  Continue IV fluids - Follow blood culture results - Admit to stepdown low blood pressure in the ED   Tobacco -Encourage cessation.  This was discussed with the patient and should be reviewed on an ongoing basis.   -Patch ordered.  HIV -Check CD4 count -Followed at [facility] home meds: Epzicom, Atazanavir, Raltegravir  Schizophrenia with ID -Continue home meds: , Depakote,  , Seroquel, hold Haldol due to low potassium, check magnesium Check  Depakote levels  Hypokalemia Replete, check magnesium     DVT prophylaxis-Lovenox  Code Status History    Date Active Date Inactive Code Status Order ID Comments User Context   12/26/2016  9:00 PM 12/30/2016  2:20 PM Full Code [id number]  Karmen Bongo, MD Inpatient       consults called: none   Family Communication: Admission, patients condition and plan of care including tests being ordered have been discussed with the patient  who indicates understanding and agree with the plan and Code Status  Admission status: inpatient    Disposition plan: Further plan will depend as patient's clinical course evolves and further radiologic and laboratory data become available. Likely home when stable   At the time of admission, it appears that the appropriate admission status for this patient is INPATIENT .Thisis judged to be reasonable and necessary in order to provide the required intensity of service to ensure the patient's safetygiven thepresenting symptoms, physical exam findings, and initial radiographic and laboratory data in the context of their chronic comorbidities.   Reyne Dumas MD [facility] Pager [phone]  If 7PM-7AM, please contact night-coverage [email or web address] Password [facility]  08/08/2017, 2:04 PM

## 2017-08-08 NOTE — ED Notes (Signed)
RT paged.

## 2017-08-08 NOTE — ED Notes (Signed)
Lab has obtained both cultures.

## 2017-08-08 NOTE — ED Notes (Signed)
Pt very poor IV stick. This nurse made two attempts. Charge nurse requested for attempt.

## 2017-08-08 NOTE — ED Notes (Signed)
Lab at bedside

## 2017-08-08 NOTE — ED Triage Notes (Signed)
Pt c/o cp/n/v since last night.

## 2017-08-08 NOTE — ED Notes (Signed)
Pt oxygen dropped again. Pt is currently on 6L. Pt lifted up in bed and position of pulse ox has been moved to her ear. Pt now 99% on 6L. Currently being given xopenex by RT.

## 2017-08-08 NOTE — Progress Notes (Signed)
Pharmacy Antibiotic Note  Karina Clarke is a 56 y.o. female admitted on 08/08/2017 with sepsis.  Pharmacy has been consulted for Vancomcyin and zosyn dosing.  Plan: Vancomycin 2000mg  loading dose, then 1000mg  IV every q12h hours.  Goal trough 15-20 mcg/mL. Zosyn 3.375g IV q8h (4 hour infusion).  F/U cxs and clinical progress Monitor V/S, labs, and levels as indicated  Height: 5\' 11"  (180.3 cm) Weight: 240 lb (108.9 kg) IBW/kg (Calculated) : 70.8  Temp (24hrs), Avg:99.3 F (37.4 C), Min:99.3 F (37.4 C), Max:99.3 F (37.4 C)   Recent Labs Lab 08/08/17 1148 08/08/17 1159 08/08/17 1238  WBC 7.8  --   --   CREATININE 1.34* 1.30*  --   LATICACIDVEN  --   --  2.45*    Normalized CrCl is 53mls/min Estimated Creatinine Clearance: 67.2 mL/min (A) (by C-G formula based on SCr of 1.3 mg/dL (H)).    Allergies  Allergen Reactions  . Asa [Aspirin] Nausea And Vomiting    Antimicrobials this admission: Vancomycin 9/14 >>  Zosyn 9/14 >>   Dose adjustments this admission: N/A  Microbiology results: 9/14 BCx: pending  Thank you for allowing pharmacy to be a part of this patient's care.  Isac Sarna, BS Pharm D, [place] Clinical Pharmacist Pager [phone] 08/08/2017 2:30 PM

## 2017-08-08 NOTE — ED Provider Notes (Signed)
[facility] DEPT Provider Note   CSN: [id number] Arrival date & time: 08/08/17  1120     History   Chief Complaint Chief Complaint  Patient presents with  . Emesis    HPI Karina Clarke is a 56 y.o. female.  HPI Patient presents with one day of cough productive of white sputum, subjective fevers and chills and nausea with one episode of vomiting overnight. Denies any pain. Patient was seen by primary physician yesterday and received influenza vaccine. Patient is a resident of group home and caretaker is with her. Past Medical History:  Diagnosis Date  . 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 Bloating    per sister  . Chronic right hip pain    sister states circulation problem in hip  . Family history of adverse reaction to anesthesia    sister has hx. of post-op N/V  . High cholesterol   . HIV (human immunodeficiency virus infection) ([place])   . Hypertension   . Limp   . Mild intellectual disability   . No natural teeth    does not wear her dentures  . Paranoid schizophrenia ([place])   . Personality disorder   . Schizophrenia [facility])     Patient Active Problem List   Diagnosis Date Noted  . Influenza 12/26/2016  . Sepsis ([place]) 12/26/2016  . CAP (community acquired pneumonia) 12/26/2016  . Tobacco use disorder 12/26/2016  . HIV (human immunodeficiency virus infection) ([place]) 12/26/2016  . Schizophrenia ([place]) 12/26/2016  . Personality disorder 08/22/2016  . Encounter for screening colonoscopy 05/24/2015    Past Surgical History:  Procedure Laterality Date  . BREAST LUMPECTOMY WITH NEEDLE LOCALIZATION Left 07/28/2015   Procedure: BREAST LUMPECTOMY WITH NEEDLE LOCALIZATION;  Surgeon: Autumn Messing III, MD;  Location: [facility];  Service: General;  Laterality: Left;  . COLONOSCOPY WITH PROPOFOL N/A 06/13/2015   SLF: 1. the left colon is redundant 2. the examination was otherwise normal 3. small internal hemorrhoids 4. moderate sized external  hemorrhoids  . HEMORRHOID SURGERY    . UMBILICAL HERNIA REPAIR      OB History    No data available       Home Medications    Prior to Admission medications   Medication Sig Start Date End Date Taking? Authorizing Provider  abacavir-lamiVUDine (EPZICOM) 600-300 MG per tablet Take 1 tablet by mouth daily.   Yes [provider]  albuterol (PROVENTIL HFA;VENTOLIN HFA) 108 (90 Base) MCG/ACT inhaler Inhale 1-2 puffs into the lungs 2 (two) times daily as needed for wheezing or shortness of breath.   Yes [provider]  atazanavir (REYATAZ) 200 MG capsule Take 400 mg by mouth daily.    Yes [provider]  atorvastatin (LIPITOR) 10 MG tablet Take 10 mg by mouth 2 (two) times daily.   Yes [provider]  divalproex (DEPAKOTE) 500 MG DR tablet Take 500 mg by mouth See admin instructions. Take 1 tablet (500 mg) by mouth at 4pm and 8pm daily   Yes [provider]  haloperidol (HALDOL) 10 MG tablet Take 20 mg by mouth 2 (two) times daily.   Yes [provider]  haloperidol decanoate (HALDOL DECANOATE) 100 MG/ML injection Inject 100 mg into the muscle every 28 (twenty-eight) days. Last injection 06/09/15   Yes [provider]  hydrOXYzine (VISTARIL) 25 MG capsule Take 25 mg by mouth at bedtime.    Yes [provider]  ibuprofen (ADVIL,MOTRIN) 800 MG tablet Take 800 mg by mouth  every 8 (eight) hours as needed for mild pain.   Yes [provider]  nicotine (NICODERM CQ - DOSED IN MG/24 HOURS) 14 mg/24hr patch Place 1 patch (14 mg total) onto the skin daily. 12/30/16  Yes Rosita Fire, MD  QUEtiapine (SEROQUEL) 200 MG tablet Take 200 mg by mouth See admin instructions. Take 1 tablet (200 mg) by mouth with supper and at bedtime daily   Yes [provider]  raltegravir (ISENTRESS) 400 MG tablet Take 400 mg by mouth 2 (two) times daily.   Yes [provider]  valACYclovir (VALTREX) 1000 MG tablet Take 1,000 mg  by mouth daily.    Yes [provider]  zolpidem (AMBIEN) 10 MG tablet Take 5 mg by mouth at bedtime.    Yes [provider]    Family History Family History  Problem Relation Age of Onset  . Anesthesia problems Sister        post-op N/V    Social History Social History  Substance Use Topics  . Smoking status: Current Every Day Smoker    Packs/day: 1.00    Years: 18.00    Types: Cigarettes  . Smokeless tobacco: Never Used  . Alcohol use No     Allergies   Asa [aspirin]   Review of Systems Review of Systems  Constitutional: Positive for chills, diaphoresis, fatigue and fever.  Respiratory: Positive for cough, shortness of breath and wheezing.   Cardiovascular: Negative for chest pain, palpitations and leg swelling.  Gastrointestinal: Positive for nausea and vomiting. Negative for abdominal pain, constipation and diarrhea.  Genitourinary: Negative for dysuria, flank pain and frequency.  Musculoskeletal: Negative for arthralgias, back pain, myalgias and neck pain.  Skin: Negative for rash and wound.  Neurological: Negative for dizziness, weakness, light-headedness, numbness and headaches.  All other systems reviewed and are negative.    Physical Exam Updated Vital Signs BP (!) 131/55   Pulse (!) 102   Temp 99.3 F (37.4 C)   Resp 17   Ht 5\' 11"  (1.803 m)   Wt 109.4 kg (241 lb 2.9 oz)   SpO2 99%   BMI 33.64 kg/m   Physical Exam  Constitutional: She is oriented to person, place, and time. She appears well-developed and well-nourished.  HENT:  Head: Normocephalic and atraumatic.  Mouth/Throat: Oropharynx is clear and moist. No oropharyngeal exudate.  Eyes: Pupils are equal, round, and reactive to light. EOM are normal.  Neck: Normal range of motion. Neck supple.  No meningismus  Cardiovascular: Regular rhythm.   tachycardia  Pulmonary/Chest: She has wheezes.  Diminished breath sounds in the left lung field. There is expiratory wheezing  throughout.  Abdominal: Soft. Bowel sounds are normal. There is no tenderness. There is no rebound and no guarding.  Musculoskeletal: Normal range of motion. She exhibits no edema or tenderness.  No lower extremity swelling, asymmetry or tenderness.  Lymphadenopathy:    She has no cervical adenopathy.  Neurological: She is alert and oriented to person, place, and time.  Moving all extremities without focal deficit. Sensation intact.  Skin: Skin is warm. No rash noted. She is diaphoretic. No erythema.  Psychiatric: She has a normal mood and affect. Her behavior is normal.  Nursing note and vitals reviewed.    ED Treatments / Results  Labs (all labs ordered are listed, but only abnormal results are displayed) Labs Reviewed  COMPREHENSIVE METABOLIC PANEL - Abnormal; Notable for the following:       Result Value   Potassium 3.1 (*)  Glucose, Bld 113 (*)    Creatinine, Ser 1.34 (*)    Total Protein 8.2 (*)    GFR calc non Af Amer 44 (*)    GFR calc Af Amer 51 (*)    All other components within normal limits  CBC WITH DIFFERENTIAL/PLATELET - Abnormal; Notable for the following:    RDW 15.8 (*)    Platelets 127 (*)    All other components within normal limits  I-STAT CG4 LACTIC ACID, ED - Abnormal; Notable for the following:    Lactic Acid, Venous 2.45 (*)    All other components within normal limits  CBG MONITORING, ED - Abnormal; Notable for the following:    Glucose-Capillary 107 (*)    All other components within normal limits  I-STAT CHEM 8, ED - Abnormal; Notable for the following:    Potassium 3.2 (*)    Creatinine, Ser 1.30 (*)    Glucose, Bld 111 (*)    Hemoglobin 15.3 (*)    All other components within normal limits  CULTURE, BLOOD (ROUTINE X 2)  CULTURE, BLOOD (ROUTINE X 2)  MRSA PCR SCREENING  TROPONIN I  INFLUENZA PANEL BY PCR (TYPE A & B)  URINALYSIS, ROUTINE W REFLEX MICROSCOPIC  CD4/CD8 (T-HELPER/T-SUPPRESSOR CELL)  T-HELPER CELLS (CD4) COUNT (NOT AT [facility])   LACTIC ACID, PLASMA  LACTIC ACID, PLASMA  PROCALCITONIN  PROTIME-INR  APTT  VALPROIC ACID LEVEL  MAGNESIUM  I-STAT CG4 LACTIC ACID, ED    EKG  EKG Interpretation  Date/Time:  Friday August 08 2017 11:42:13 EDT Ventricular Rate:  139 PR Interval:    QRS Duration: 105 QT Interval:  281 QTC Calculation: 428 R Axis:   -96 Text Interpretation:  Sinus tachycardia Probable left atrial enlargement Right ventricular hypertrophy Inferolateral infarct, old Confirmed by Lita Mains  MD, Malayjah Otoole ([id number]) on 08/08/2017 12:03:45 PM       Radiology Dg Chest Port 1 View  Result Date: 08/08/2017 CLINICAL DATA:  Cough and chest pain. EXAM: PORTABLE CHEST 1 VIEW COMPARISON:  June 22, 2013 and December 26, 2016 FINDINGS: No pneumothorax. Stable mild cardiomegaly. Mild prominence the right hilum is likely due to patient rotation. This finding is not significantly changed since 2014. No nodules or masses. There is increased opacity in the right base. No overt edema. IMPRESSION: Increasing opacity in the right base. This could be better evaluated with a PA and lateral chest x-ray. Developing pneumonia is not excluded. Electronically Signed   By: Dorise Bullion III M.D   On: 08/08/2017 12:20    Procedures Procedures (including critical care time)  Medications Ordered in ED Medications  vancomycin (VANCOCIN) 2,000 mg in sodium chloride 0.9 % 500 mL IVPB (0 mg Intravenous Stopped 08/08/17 1514)  vancomycin (VANCOCIN) IVPB 1000 mg/200 mL premix (not administered)  methylPREDNISolone sodium succinate (SOLU-MEDROL) 125 mg/2 mL injection 60 mg (not administered)  piperacillin-tazobactam (ZOSYN) IVPB 3.375 g (not administered)  ondansetron (ZOFRAN) injection 4 mg (not administered)  acetaminophen (TYLENOL) tablet 650 mg (not administered)  0.9 % NaCl with KCl 40 mEq / L  infusion (not administered)  sodium chloride 0.9 % bolus 1,000 mL (0 mLs Intravenous Stopped 08/08/17 1350)    And  sodium chloride 0.9 %  bolus 1,000 mL (0 mLs Intravenous Stopped 08/08/17 1350)    And  sodium chloride 0.9 % bolus 1,000 mL (1,000 mLs Intravenous New Bag/Given 08/08/17 1354)    And  sodium chloride 0.9 % bolus 500 mL (500 mLs Intravenous New Bag/Given 08/08/17 1354)  piperacillin-tazobactam (  ZOSYN) IVPB 3.375 g (0 g Intravenous Stopped 08/08/17 1232)  levalbuterol (XOPENEX) nebulizer solution 0.63 mg (0.63 mg Nebulization Given 08/08/17 1213)  methylPREDNISolone sodium succinate (SOLU-MEDROL) 125 mg/2 mL injection 125 mg (125 mg Intravenous Given 08/08/17 1203)  levalbuterol (XOPENEX) nebulizer solution 1.25 mg (1.25 mg Nebulization Given 08/08/17 1343)   CRITICAL CARE Performed by: Lita Mains, Jonny Longino Total critical care time: 30 minutes Critical care time was exclusive of separately billable procedures and treating other patients. Critical care was necessary to treat or prevent imminent or life-threatening deterioration. Critical care was time spent personally by me on the following activities: development of treatment plan with patient and/or surrogate as well as nursing, discussions with consultants, evaluation of patient's response to treatment, examination of patient, obtaining history from patient or surrogate, ordering and performing treatments and interventions, ordering and review of laboratory studies, ordering and review of radiographic studies, pulse oximetry and re-evaluation of patient's condition.  Initial Impression / Assessment and Plan / ED Course  I have reviewed the triage vital signs and the nursing notes.  Pertinent labs & imaging results that were available during my care of the patient were reviewed by me and considered in my medical decision making (see chart for details).     Patient meets sepsis criteria. Initiated sepsis protocol.   Blood pressure and heart rate improved with IV fluids. Given 2 nebulized treatment and able to wean patient down to 3 L of oxygen by nasal cannula.  Discussed  with hospitalist who will see patient in the emergency department and admit to step down bed. Final Clinical Impressions(s) / ED Diagnoses   Final diagnoses:  Severe sepsis [facility])    New Prescriptions Current Discharge Medication List       Julianne Rice, MD 08/08/17 1453

## 2017-08-09 DIAGNOSIS — A408 Other streptococcal sepsis: Secondary | ICD-10-CM

## 2017-08-09 LAB — COMPREHENSIVE METABOLIC PANEL
ALBUMIN: 3.2 g/dL — AB (ref 3.5–5.0)
ALT: 21 U/L (ref 14–54)
ANION GAP: 6 (ref 5–15)
AST: 23 U/L (ref 15–41)
Alkaline Phosphatase: 61 U/L (ref 38–126)
BUN: 12 mg/dL (ref 6–20)
CHLORIDE: 108 mmol/L (ref 101–111)
CO2: 26 mmol/L (ref 22–32)
Calcium: 8.2 mg/dL — ABNORMAL LOW (ref 8.9–10.3)
Creatinine, Ser: 0.91 mg/dL (ref 0.44–1.00)
GFR calc Af Amer: >60 mL/min (ref >60)
GFR calc non Af Amer: >60 mL/min (ref >60)
GLUCOSE: 220 mg/dL — AB (ref 65–99)
POTASSIUM: 4.6 mmol/L (ref 3.5–5.1)
SODIUM: 140 mmol/L (ref 135–145)
Total Bilirubin: 1 mg/dL (ref 0.3–1.2)
Total Protein: 6.9 g/dL (ref 6.5–8.1)

## 2017-08-09 LAB — CBC
HCT: 37.2 % (ref 36.0–46.0)
Hemoglobin: 11.9 g/dL — ABNORMAL LOW (ref 12.0–15.0)
MCH: 30.7 pg (ref 26.0–34.0)
MCHC: 32 g/dL (ref 30.0–36.0)
MCV: 95.9 fL (ref 78.0–100.0)
PLATELETS: 110 10*3/uL — AB (ref 150–400)
RBC: 3.88 MIL/uL (ref 3.87–5.11)
RDW: 16.1 % — AB (ref 11.5–15.5)
WBC: 6.5 10*3/uL (ref 4.0–10.5)

## 2017-08-09 MED ORDER — MENTHOL 3 MG MT LOZG
1.0000 | LOZENGE | OROMUCOSAL | Status: DC | PRN
  Administered 2017-08-09: 3 mg via ORAL
  Filled 2017-08-09 (×2): qty 9

## 2017-08-09 MED ORDER — ENOXAPARIN SODIUM 60 MG/0.6ML ~~LOC~~ SOLN
50.0000 mg | SUBCUTANEOUS | Status: DC
Start: 2017-08-09 — End: 2017-08-10
  Administered 2017-08-09 – 2017-08-10 (×2): 50 mg via SUBCUTANEOUS
  Filled 2017-08-09 (×2): qty 0.6

## 2017-08-09 NOTE — Progress Notes (Signed)
[facility] PROGRESS NOTE  Karina Clarke TIR:[id number] DOB: 1961/09/09 DOA: 08/08/2017   PCP: Rosita Fire, MD     Assessment/Plan: Principal Problem:   Sepsis [facility]) Active Problems:   CAP (community acquired pneumonia)   Tobacco use disorder   HIV (human immunodeficiency virus infection) ([place])   Schizophrenia ([place])    56 year old female with a history of HIV, disability, schizophrenia, polysubstance abuse, dyslipidemia  , smoker followed by infectious disease at [facility], who presents to ED today because of one day's worth of productive cough, subjective fever, chills, nausea with one episode of vomiting last night. Patient had also received her insurance to vaccine yesterday. Patient is a resident of group home and is brought in by her caretaker. Initially hypotensive with systolic blood pressure in the 80s, lactic acid 2.45, potassium 3.2, creatinine 1.3, chest x-ray opacity in the right base. Patient admitted for possible sepsis physiology due to right lower lobe pneumonia  Assessment and plan Sepsis resulting from  CAP /aspiration pneumonia -Fever, tachycardia with elevated lactate to 2.45 -Sepsis protocol initiated in the ER  Influenza panel negative -Possibly has CAP vs aspiration - Broad-spectrum antibiotics as the patient lives in a group home  Continue IV fluids -Blood culture no growth so far Blood pressure improved, hemodynamically stable, transfer to telemetry   Tobacco -Encourage cessation. This was discussed with the patient and should be reviewed on an ongoing basis.  -Patch ordered.  HIV -Check CD4 count -Followed at [place] home meds: Epzicom, Atazanavir, Raltegravir  Schizophrenia with ID -Continue home meds: , Depakote,  , Seroquel, hold Haldol due to low potassium, magnesium 1.8 Check Depakote levels  Hypokalemia Repleted     DVT prophylaxsis Lovenox  Code Status:  Full code   Family Communication: Discussed  in detail with the patient, all imaging results, lab results explained to the patient   Disposition Plan:Transfer to telemetry      Consultants:  None  Procedures:  None  Antibiotics: Anti-infectives    Start     Dose/Rate Route Frequency Ordered Stop   08/09/17 1000  abacavir (ZIAGEN) tablet 600 mg     600 mg Oral Daily 08/08/17 1611     08/09/17 1000  lamiVUDine (EPIVIR) tablet 300 mg     300 mg Oral Daily 08/08/17 1611     08/09/17 0800  atazanavir (REYATAZ) capsule 400 mg     400 mg Oral Daily with breakfast 08/08/17 1555     08/09/17 0100  vancomycin (VANCOCIN) IVPB 1000 mg/200 mL premix     1,000 mg 200 mL/hr over 60 Minutes Intravenous Every 12 hours 08/08/17 1348     08/08/17 1800  piperacillin-tazobactam (ZOSYN) IVPB 3.375 g     3.375 g 12.5 mL/hr over 240 Minutes Intravenous Every 8 hours 08/08/17 1402        HPI/Subjective: Patient feels better, hemodynamically stable overnight, afebrile  Objective: Vitals:   08/09/17 0600 08/09/17 0700 08/09/17 0724 08/09/17 0800  BP: 135/89 (!) 141/89  131/78  Pulse: 87 81 84 90  Resp: 18 19 16    Temp:   98.7 F (37.1 C)   TempSrc:   Oral   SpO2: 96% 94% 97% 92%  Weight:      Height:        Intake/Output Summary (Last 24 hours) at 08/09/17 0955 Last data filed at 08/09/17 0926  Gross per 24 hour  Intake           8462.5 ml  Output  800 ml  Net           7662.5 ml    Exam:  Examination:  General exam: Appears calm and comfortable  Respiratory system: Clear to auscultation. Respiratory effort normal. Cardiovascular system: S1 & S2 heard, RRR. No JVD, murmurs, rubs, gallops or clicks. No pedal edema. Gastrointestinal system: Abdomen is nondistended, soft and nontender. No organomegaly or masses felt. Normal bowel sounds heard. Central nervous system: Alert and oriented. No focal neurological deficits. Extremities: Symmetric 5 x 5 power. Skin: No rashes, lesions or ulcers Psychiatry: Judgement  and insight appear normal. Mood & affect appropriate.     Data Reviewed: I have personally reviewed following labs and imaging studies  Micro Results Recent Results (from the past 240 hour(s))  Blood Culture (routine x 2)     Status: None (Preliminary result)   Collection Time: 08/08/17 11:51 AM  Result Value Ref Range Status   Specimen Description BLOOD RIGHT ARM  Final   Special Requests   Final    BOTTLES DRAWN AEROBIC AND ANAEROBIC Blood Culture adequate volume   Culture NO GROWTH < 24 HOURS  Final   Report Status PENDING  Incomplete  Blood Culture (routine x 2)     Status: None (Preliminary result)   Collection Time: 08/08/17 11:51 AM  Result Value Ref Range Status   Specimen Description BLOOD LEFT HAND  Final   Special Requests   Final    BOTTLES DRAWN AEROBIC ONLY Blood Culture adequate volume   Culture NO GROWTH < 24 HOURS  Final   Report Status PENDING  Incomplete  MRSA PCR Screening     Status: None   Collection Time: 08/08/17  2:21 PM  Result Value Ref Range Status   MRSA by PCR NEGATIVE NEGATIVE Final    Comment:        The GeneXpert MRSA Assay (FDA approved for NASAL specimens only), is one component of a comprehensive MRSA colonization surveillance program. It is not intended to diagnose MRSA infection nor to guide or monitor treatment for MRSA infections.     Radiology Reports Dg Chest Port 1 View  Result Date: 08/08/2017 CLINICAL DATA:  Cough and chest pain. EXAM: PORTABLE CHEST 1 VIEW COMPARISON:  June 22, 2013 and December 26, 2016 FINDINGS: No pneumothorax. Stable mild cardiomegaly. Mild prominence the right hilum is likely due to patient rotation. This finding is not significantly changed since 2014. No nodules or masses. There is increased opacity in the right base. No overt edema. IMPRESSION: Increasing opacity in the right base. This could be better evaluated with a PA and lateral chest x-ray. Developing pneumonia is not excluded. Electronically  Signed   By: Dorise Bullion III M.D   On: 08/08/2017 12:20     CBC  Recent Labs Lab 08/08/17 1148 08/08/17 1159 08/09/17 0440  WBC 7.8  --  6.5  HGB 13.9 15.3* 11.9*  HCT 42.4 45.0 37.2  PLT 127*  --  110*  MCV 95.5  --  95.9  MCH 31.3  --  30.7  MCHC 32.8  --  32.0  RDW 15.8*  --  16.1*  LYMPHSABS 2.3  --   --   MONOABS 1.0  --   --   EOSABS 0.1  --   --   BASOSABS 0.0  --   --     Chemistries   Recent Labs Lab 08/08/17 1148 08/08/17 1159 08/08/17 1439 08/09/17 0440  NA 140 142  --  140  K 3.1*  3.2*  --  4.6  CL 102 101  --  108  CO2 27  --   --  26  GLUCOSE 113* 111*  --  220*  BUN 11 10  --  12  CREATININE 1.34* 1.30*  --  0.91  CALCIUM 9.3  --   --  8.2*  MG  --   --  1.8  --   AST 24  --   --  23  ALT 21  --   --  21  ALKPHOS 80  --   --  61  BILITOT 1.2  --   --  1.0   ------------------------------------------------------------------------------------------------------------------ estimated creatinine clearance is 96.5 mL/min (by C-G formula based on SCr of 0.91 mg/dL). ------------------------------------------------------------------------------------------------------------------ No results for input(s): HGBA1C in the last 72 hours. ------------------------------------------------------------------------------------------------------------------ No results for input(s): CHOL, HDL, LDLCALC, TRIG, CHOLHDL, LDLDIRECT in the last 72 hours. ------------------------------------------------------------------------------------------------------------------ No results for input(s): TSH, T4TOTAL, T3FREE, THYROIDAB in the last 72 hours.  Invalid input(s): FREET3 ------------------------------------------------------------------------------------------------------------------ No results for input(s): VITAMINB12, FOLATE, FERRITIN, TIBC, IRON, RETICCTPCT in the last 72 hours.  Coagulation profile  Recent Labs Lab 08/08/17 1439  INR 1.10    No results  for input(s): DDIMER in the last 72 hours.  Cardiac Enzymes  Recent Labs Lab 08/08/17 1210  TROPONINI <0.03   ------------------------------------------------------------------------------------------------------------------ Invalid input(s): POCBNP   CBG:  Recent Labs Lab 08/08/17 1147  GLUCAP 107*       Studies: Dg Chest Port 1 View  Result Date: 08/08/2017 CLINICAL DATA:  Cough and chest pain. EXAM: PORTABLE CHEST 1 VIEW COMPARISON:  June 22, 2013 and December 26, 2016 FINDINGS: No pneumothorax. Stable mild cardiomegaly. Mild prominence the right hilum is likely due to patient rotation. This finding is not significantly changed since 2014. No nodules or masses. There is increased opacity in the right base. No overt edema. IMPRESSION: Increasing opacity in the right base. This could be better evaluated with a PA and lateral chest x-ray. Developing pneumonia is not excluded. Electronically Signed   By: Dorise Bullion III M.D   On: 08/08/2017 12:20      No results found for: HGBA1C Lab Results  Component Value Date   CREATININE 0.91 08/09/2017       Scheduled Meds: . abacavir  600 mg Oral Daily   And  . lamiVUDine  300 mg Oral Daily  . atazanavir  400 mg Oral Q breakfast  . atorvastatin  10 mg Oral BID  . divalproex  500 mg Oral BID  . enoxaparin (LOVENOX) injection  50 mg Subcutaneous Q24H  . hydrOXYzine  25 mg Oral QHS  . methylPREDNISolone (SOLU-MEDROL) injection  60 mg Intravenous Q6H  . nicotine  14 mg Transdermal Daily  . QUEtiapine  200 mg Oral BID  . raltegravir  400 mg Oral BID  . valACYclovir  1,000 mg Oral Daily  . zolpidem  5 mg Oral QHS   Continuous Infusions: . 0.9 % NaCl with KCl 40 mEq / L 75 mL/hr at 08/09/17 0800  . piperacillin-tazobactam (ZOSYN)  IV 3.375 g (08/09/17 0949)  . vancomycin Stopped (08/09/17 0239)     LOS: 1 day    Time spent: >30 MINS    Reyne Dumas  [facility] Pager [phone]. If 7PM-7AM, please contact  night-coverage at [email or web address], password [facility] 08/09/2017, 9:55 AM  LOS: 1 day

## 2017-08-09 NOTE — Evaluation (Signed)
Clinical/Bedside Swallow Evaluation Patient Details  Name: Karina Clarke MRN: [id number] Date of Birth: May 03, 1961  Today's Date: 08/09/2017 Time: SLP Start Time (ACUTE ONLY): 1302 SLP Stop Time (ACUTE ONLY): 1327 SLP Time Calculation (min) (ACUTE ONLY): 25 min  Past Medical History:  Past Medical History:  Diagnosis Date  . 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 Bloating    per sister  . Chronic right hip pain    sister states circulation problem in hip  . Family history of adverse reaction to anesthesia    sister has hx. of post-op N/V  . High cholesterol   . HIV (human immunodeficiency virus infection) ([place])   . Hypertension   . Limp   . Mild intellectual disability   . No natural teeth    does not wear her dentures  . Paranoid schizophrenia ([place])   . Personality disorder   . Schizophrenia [facility])    Past Surgical History:  Past Surgical History:  Procedure Laterality Date  . BREAST LUMPECTOMY WITH NEEDLE LOCALIZATION Left 07/28/2015   Procedure: BREAST LUMPECTOMY WITH NEEDLE LOCALIZATION;  Surgeon: Autumn Messing III, MD;  Location: [facility];  Service: General;  Laterality: Left;  . COLONOSCOPY WITH PROPOFOL N/A 06/13/2015   SLF: 1. the left colon is redundant 2. the examination was otherwise normal 3. small internal hemorrhoids 4. moderate sized external hemorrhoids  . HEMORRHOID SURGERY    . UMBILICAL HERNIA REPAIR     HPI:  56 year old female with a history of HIV, disability, schizophrenia, polysubstance abuse, dyslipidemia  , smoker followed by infectious disease at [facility], who presents to ED today because of one day's worth of productive cough, subjective fever, chills, nausea with one episode of vomiting last night. Patient had also received her insurance to vaccine yesterday. Patient is a resident of group home and is brought in by her caretaker. Initially hypotensive with systolic blood pressure in the 80s, lactic acid 2.45, potassium 3.2,  creatinine 1.3, chest x-ray opacity in the right base. Patient admitted for possible sepsis physiology due to right lower lobe pneumonia. BSE requested.   Assessment / Plan / Recommendation Clinical Impression  Clinical swallow evaluation completed at bedside. Pt had just finished lunch meal, but did not eat much of her meat and mostly ate the soft foods. Oral motor examination was unremarkable except for edentulous status (friend threw away dentures two years ago) and large appearing tongue. Pt assessed with thin liquids and regular textures all self presented and showed no overt signs or symptoms of aspiration or oral residue. Pt did complain of difficulty masticating meats and SLP offered regular diet with chopped meat vs soft diet and Pt requested soft diet during admission. Pt educated on signs and symptoms of aspiration and encouraged efforts to thoroughly masticate solids prior to swallow. No further SLP services indicated at this time. Will change to soft diet per Pt request.  SLP Visit Diagnosis: Dysphagia, oropharyngeal phase (R13.12)    Aspiration Risk  No limitations    Diet Recommendation Dysphagia 3 (Mech soft);Thin liquid   Liquid Administration via: Cup;Straw Medication Administration: Whole meds with liquid Supervision: Patient able to self feed Postural Changes: Seated upright at 90 degrees;Remain upright for at least 30 minutes after po intake    Other  Recommendations Oral Care Recommendations: Oral care BID Other Recommendations: Clarify dietary restrictions   Follow up Recommendations None      Frequency and Duration  Prognosis Prognosis for Safe Diet Advancement: Good      Swallow Study   General Date of Onset: 08/08/17 HPI: 56 year old female with a history of HIV, disability, schizophrenia, polysubstance abuse, dyslipidemia  , smoker followed by infectious disease at [facility], who presents to ED today because of one day's worth of productive cough,  subjective fever, chills, nausea with one episode of vomiting last night. Patient had also received her insurance to vaccine yesterday. Patient is a resident of group home and is brought in by her caretaker. Initially hypotensive with systolic blood pressure in the 80s, lactic acid 2.45, potassium 3.2, creatinine 1.3, chest x-ray opacity in the right base. Patient admitted for possible sepsis physiology due to right lower lobe pneumonia. BSE requested. Type of Study: Bedside Swallow Evaluation Previous Swallow Assessment: None on record Diet Prior to this Study: Regular;Thin liquids Temperature Spikes Noted: No Respiratory Status: Nasal cannula History of Recent Intubation: No Behavior/Cognition: Alert;Cooperative;Pleasant mood Oral Cavity Assessment: Within Functional Limits Oral Care Completed by SLP: No Oral Cavity - Dentition: Edentulous Vision: Functional for self-feeding Self-Feeding Abilities: Able to feed self Patient Positioning: Upright in bed Baseline Vocal Quality: Normal Volitional Cough: Strong Volitional Swallow: Able to elicit    Oral/Motor/Sensory Function Overall Oral Motor/Sensory Function: Within functional limits   Ice Chips Ice chips: Within functional limits Presentation: Spoon   Thin Liquid Thin Liquid: Within functional limits Presentation: Cup;Self Fed;Straw    Nectar Thick Nectar Thick Liquid: Not tested   Honey Thick Honey Thick Liquid: Not tested   Puree Puree: Within functional limits Presentation: Self Fed;Spoon   Solid   Thank you,  Genene Churn, CCC-SLP [phone]    Solid: Within functional limits Presentation: Self Fed        PORTER,DABNEY 08/09/2017,2:58 PM

## 2017-08-09 NOTE — Progress Notes (Signed)
PT IS TRANSFERRING TO ROOM 334 ON TELEMETRY. NSR ON THE MONITOR. DENIES ANY COUGH AT THIS TIME. ENCOURAGED TO COUGH AND DEEP BREATH TO MOVE SECREATIONS.MILD EXPIRATORY WHEEZING IN BIL  UPPER LOBES. RT AC IV PATENT. PT DENIES ANY PAIN AT THIS TIME. TRANSFER REPORT CALLED TO MORGAN RN ON 300.

## 2017-08-10 DIAGNOSIS — R6520 Severe sepsis without septic shock: Secondary | ICD-10-CM

## 2017-08-10 LAB — COMPREHENSIVE METABOLIC PANEL
ALBUMIN: 3.1 g/dL — AB (ref 3.5–5.0)
ALK PHOS: 58 U/L (ref 38–126)
ALT: 24 U/L (ref 14–54)
ANION GAP: 9 (ref 5–15)
AST: 20 U/L (ref 15–41)
BILIRUBIN TOTAL: 0.4 mg/dL (ref 0.3–1.2)
BUN: 12 mg/dL (ref 6–20)
CO2: 28 mmol/L (ref 22–32)
Calcium: 9 mg/dL (ref 8.9–10.3)
Chloride: 105 mmol/L (ref 101–111)
Creatinine, Ser: 0.85 mg/dL (ref 0.44–1.00)
GFR calc Af Amer: >60 mL/min (ref >60)
GFR calc non Af Amer: >60 mL/min (ref >60)
GLUCOSE: 257 mg/dL — AB (ref 65–99)
Potassium: 4.2 mmol/L (ref 3.5–5.1)
SODIUM: 142 mmol/L (ref 135–145)
TOTAL PROTEIN: 7 g/dL (ref 6.5–8.1)

## 2017-08-10 MED ORDER — CEFPODOXIME PROXETIL 200 MG PO TABS: 200.0000 mg | ORAL_TABLET | Freq: Two times a day (BID) | ORAL | 0 refills | Status: AC

## 2017-08-10 MED ORDER — FUROSEMIDE 10 MG/ML IJ SOLN
60.0000 mg | Freq: Once | INTRAMUSCULAR | Status: AC
  Administered 2017-08-10: 60 mg via INTRAVENOUS
  Filled 2017-08-10: qty 6

## 2017-08-10 MED ORDER — MAGNESIUM OXIDE 400 MG PO CAPS: 400.0000 mg | ORAL_CAPSULE | Freq: Every morning | ORAL | 0 refills | Status: AC

## 2017-08-10 NOTE — Discharge Summary (Signed)
Physician Discharge Summary  Karina Clarke MRN: [id number] DOB/AGE: 07/25/1961 56 y.o.  PCP: Avon Gully, MD   Admit date: 08/08/2017 Discharge date: 08/10/2017  Discharge Diagnoses:    Principal Problem:   Sepsis (HCC) Active Problems:   CAP (community acquired pneumonia)   Tobacco use disorder   HIV (human immunodeficiency virus infection) (HCC)   Schizophrenia (HCC)    Follow-up recommendations Follow-up with PCP in 3-5 days , including all  additional recommended appointments as below Follow-up CBC, CMP in 3-5 days       Current Discharge Medication List    START taking these medications   Details  cefpodoxime (VANTIN) 200 MG tablet Take 1 tablet (200 mg total) by mouth 2 (two) times daily. Qty: 14 tablet, Refills: 0    Magnesium Oxide 400 MG CAPS Take 1 capsule (400 mg total) by mouth every morning. Qty: 60 each, Refills: 0      CONTINUE these medications which have NOT CHANGED   Details  abacavir-lamiVUDine (EPZICOM) 600-300 MG per tablet Take 1 tablet by mouth daily.    albuterol (PROVENTIL HFA;VENTOLIN HFA) 108 (90 Base) MCG/ACT inhaler Inhale 1-2 puffs into the lungs 2 (two) times daily as needed for wheezing or shortness of breath.    atazanavir (REYATAZ) 200 MG capsule Take 400 mg by mouth daily.     atorvastatin (LIPITOR) 10 MG tablet Take 10 mg by mouth 2 (two) times daily.    divalproex (DEPAKOTE) 500 MG DR tablet Take 500 mg by mouth See admin instructions. Take 1 tablet (500 mg) by mouth at 4pm and 8pm daily    haloperidol (HALDOL) 10 MG tablet Take 20 mg by mouth 2 (two) times daily.    haloperidol decanoate (HALDOL DECANOATE) 100 MG/ML injection Inject 100 mg into the muscle every 28 (twenty-eight) days. Last injection 06/09/15    hydrOXYzine (VISTARIL) 25 MG capsule Take 25 mg by mouth at bedtime.     nicotine (NICODERM CQ - DOSED IN MG/24 HOURS) 14 mg/24hr patch Place 1 patch (14 mg total) onto the skin daily. Qty: 28 patch, Refills: 0     QUEtiapine (SEROQUEL) 200 MG tablet Take 200 mg by mouth See admin instructions. Take 1 tablet (200 mg) by mouth with supper and at bedtime daily    raltegravir (ISENTRESS) 400 MG tablet Take 400 mg by mouth 2 (two) times daily.    valACYclovir (VALTREX) 1000 MG tablet Take 1,000 mg by mouth daily.     zolpidem (AMBIEN) 10 MG tablet Take 5 mg by mouth at bedtime.       STOP taking these medications     ibuprofen (ADVIL,MOTRIN) 800 MG tablet          Discharge Condition: Stable   Discharge Instructions Get Medicines reviewed and adjusted: Please take all your medications with you for your next visit with your Primary MD  Please request your Primary MD to go over all hospital tests and procedure/radiological results at the follow up, please ask your Primary MD to get all Hospital records sent to his/her office.  If you experience worsening of your admission symptoms, develop shortness of breath, life threatening emergency, suicidal or homicidal thoughts you must seek medical attention immediately by calling 911 or calling your MD immediately if symptoms less severe.  You must read complete instructions/literature along with all the possible adverse reactions/side effects for all the Medicines you take and that have been prescribed to you. Take any new Medicines after you have completely understood and accpet all  the possible adverse reactions/side effects.   Do not drive when taking Pain medications.   Do not take more than prescribed Pain, Sleep and Anxiety Medications  Special Instructions: If you have smoked or chewed Tobacco in the last 2 yrs please stop smoking, stop any regular Alcohol and or any Recreational drug use.  Wear Seat belts while driving.  Please note  You were cared for by a hospitalist during your hospital stay. Once you are discharged, your primary care physician will handle any further medical issues. Please note that NO REFILLS for any discharge  medications will be authorized once you are discharged, as it is imperative that you return to your primary care physician (or establish a relationship with a primary care physician if you do not have one) for your aftercare needs so that they can reassess your need for medications and monitor your lab values.     Allergies  Allergen Reactions  . Asa [Aspirin] Nausea And Vomiting      Disposition: 01-Home or Self Care   Consults:  None    Significant Diagnostic Studies:  Dg Chest Port 1 View  Result Date: 08/08/2017 CLINICAL DATA:  Cough and chest pain. EXAM: PORTABLE CHEST 1 VIEW COMPARISON:  June 22, 2013 and December 26, 2016 FINDINGS: No pneumothorax. Stable mild cardiomegaly. Mild prominence the right hilum is likely due to patient rotation. This finding is not significantly changed since 2014. No nodules or masses. There is increased opacity in the right base. No overt edema. IMPRESSION: Increasing opacity in the right base. This could be better evaluated with a PA and lateral chest x-ray. Developing pneumonia is not excluded. Electronically Signed   By: Dorise Bullion III M.D   On: 08/08/2017 12:20      Filed Weights   08/08/17 1135 08/08/17 1432 08/09/17 0400  Weight: 108.9 kg (240 lb) 109.4 kg (241 lb 2.9 oz) 110.1 kg (242 lb 11.6 oz)     Microbiology: Recent Results (from the past 240 hour(s))  Blood Culture (routine x 2)     Status: None (Preliminary result)   Collection Time: 08/08/17 11:51 AM  Result Value Ref Range Status   Specimen Description BLOOD RIGHT ARM  Final   Special Requests   Final    BOTTLES DRAWN AEROBIC AND ANAEROBIC Blood Culture adequate volume   Culture NO GROWTH 2 DAYS  Final   Report Status PENDING  Incomplete  Blood Culture (routine x 2)     Status: None (Preliminary result)   Collection Time: 08/08/17 11:51 AM  Result Value Ref Range Status   Specimen Description BLOOD LEFT HAND  Final   Special Requests   Final    BOTTLES DRAWN  AEROBIC ONLY Blood Culture adequate volume   Culture NO GROWTH 2 DAYS  Final   Report Status PENDING  Incomplete  MRSA PCR Screening     Status: None   Collection Time: 08/08/17  2:21 PM  Result Value Ref Range Status   MRSA by PCR NEGATIVE NEGATIVE Final    Comment:        The GeneXpert MRSA Assay (FDA approved for NASAL specimens only), is one component of a comprehensive MRSA colonization surveillance program. It is not intended to diagnose MRSA infection nor to guide or monitor treatment for MRSA infections.        Blood Culture    Component Value Date/Time   SDES BLOOD RIGHT ARM 08/08/2017 1151   SDES BLOOD LEFT HAND 08/08/2017 1151   SPECREQUEST  08/08/2017 1151    BOTTLES DRAWN AEROBIC AND ANAEROBIC Blood Culture adequate volume   SPECREQUEST  08/08/2017 1151    BOTTLES DRAWN AEROBIC ONLY Blood Culture adequate volume   CULT NO GROWTH 2 DAYS 08/08/2017 1151   CULT NO GROWTH 2 DAYS 08/08/2017 1151   REPTSTATUS PENDING 08/08/2017 1151   REPTSTATUS PENDING 08/08/2017 1151      Labs: Results for orders placed or performed during the hospital encounter of 08/08/17 (from the past 48 hour(s))  CBG monitoring, ED     Status: Abnormal   Collection Time: 08/08/17 11:47 AM  Result Value Ref Range   Glucose-Capillary 107 (H) 65 - 99 mg/dL  Comprehensive metabolic panel     Status: Abnormal   Collection Time: 08/08/17 11:48 AM  Result Value Ref Range   Sodium 140 135 - 145 mmol/L   Potassium 3.1 (L) 3.5 - 5.1 mmol/L   Chloride 102 101 - 111 mmol/L   CO2 27 22 - 32 mmol/L   Glucose, Bld 113 (H) 65 - 99 mg/dL   BUN 11 6 - 20 mg/dL   Creatinine, Ser 1.34 (H) 0.44 - 1.00 mg/dL   Calcium 9.3 8.9 - 10.3 mg/dL   Total Protein 8.2 (H) 6.5 - 8.1 g/dL   Albumin 4.0 3.5 - 5.0 g/dL   AST 24 15 - 41 U/L   ALT 21 14 - 54 U/L   Alkaline Phosphatase 80 38 - 126 U/L   Total Bilirubin 1.2 0.3 - 1.2 mg/dL   GFR calc non Af Amer 44 (L) >60 mL/min   GFR calc Af Amer 51 (L) >60  mL/min    Comment: (NOTE) The eGFR has been calculated using the CKD EPI equation. This calculation has not been validated in all clinical situations. eGFR's persistently <60 mL/min signify possible Chronic Kidney Disease.    Anion gap 11 5 - 15  CBC WITH DIFFERENTIAL     Status: Abnormal   Collection Time: 08/08/17 11:48 AM  Result Value Ref Range   WBC 7.8 4.0 - 10.5 K/uL   RBC 4.44 3.87 - 5.11 MIL/uL   Hemoglobin 13.9 12.0 - 15.0 g/dL   HCT 42.4 36.0 - 46.0 %   MCV 95.5 78.0 - 100.0 fL   MCH 31.3 26.0 - 34.0 pg   MCHC 32.8 30.0 - 36.0 g/dL   RDW 15.8 (H) 11.5 - 15.5 %   Platelets 127 (L) 150 - 400 K/uL   Neutrophils Relative % 57 %   Neutro Abs 4.4 1.7 - 7.7 K/uL   Lymphocytes Relative 29 %   Lymphs Abs 2.3 0.7 - 4.0 K/uL   Monocytes Relative 13 %   Monocytes Absolute 1.0 0.1 - 1.0 K/uL   Eosinophils Relative 1 %   Eosinophils Absolute 0.1 0.0 - 0.7 K/uL   Basophils Relative 0 %   Basophils Absolute 0.0 0.0 - 0.1 K/uL  Blood Culture (routine x 2)     Status: None (Preliminary result)   Collection Time: 08/08/17 11:51 AM  Result Value Ref Range   Specimen Description BLOOD RIGHT ARM    Special Requests      BOTTLES DRAWN AEROBIC AND ANAEROBIC Blood Culture adequate volume   Culture NO GROWTH 2 DAYS    Report Status PENDING   Blood Culture (routine x 2)     Status: None (Preliminary result)   Collection Time: 08/08/17 11:51 AM  Result Value Ref Range   Specimen Description BLOOD LEFT HAND    Special Requests  BOTTLES DRAWN AEROBIC ONLY Blood Culture adequate volume   Culture NO GROWTH 2 DAYS    Report Status PENDING   I-stat chem 8, ed     Status: Abnormal   Collection Time: 08/08/17 11:59 AM  Result Value Ref Range   Sodium 142 135 - 145 mmol/L   Potassium 3.2 (L) 3.5 - 5.1 mmol/L   Chloride 101 101 - 111 mmol/L   BUN 10 6 - 20 mg/dL   Creatinine, Ser 1.30 (H) 0.44 - 1.00 mg/dL   Glucose, Bld 111 (H) 65 - 99 mg/dL   Calcium, Ion 1.18 1.15 - 1.40 mmol/L    TCO2 28 22 - 32 mmol/L   Hemoglobin 15.3 (H) 12.0 - 15.0 g/dL   HCT 45.0 36.0 - 46.0 %  Troponin I     Status: None   Collection Time: 08/08/17 12:10 PM  Result Value Ref Range   Troponin I <0.03 <0.03 ng/mL  I-Stat CG4 Lactic Acid, ED  (not at  [facility])     Status: Abnormal   Collection Time: 08/08/17 12:38 PM  Result Value Ref Range   Lactic Acid, Venous 2.45 (HH) 0.5 - 1.9 mmol/L  Influenza panel by PCR (type A & B)     Status: None   Collection Time: 08/08/17  1:05 PM  Result Value Ref Range   Influenza A By PCR NEGATIVE NEGATIVE   Influenza B By PCR NEGATIVE NEGATIVE    Comment: (NOTE) The Xpert Xpress Flu assay is intended as an aid in the diagnosis of  influenza and should not be used as a sole basis for treatment.  This  assay is FDA approved for nasopharyngeal swab specimens only. Nasal  washings and aspirates are unacceptable for Xpert Xpress Flu testing.   MRSA PCR Screening     Status: None   Collection Time: 08/08/17  2:21 PM  Result Value Ref Range   MRSA by PCR NEGATIVE NEGATIVE    Comment:        The GeneXpert MRSA Assay (FDA approved for NASAL specimens only), is one component of a comprehensive MRSA colonization surveillance program. It is not intended to diagnose MRSA infection nor to guide or monitor treatment for MRSA infections.   Lactic acid, plasma     Status: None   Collection Time: 08/08/17  2:39 PM  Result Value Ref Range   Lactic Acid, Venous 1.1 0.5 - 1.9 mmol/L  Procalcitonin     Status: None   Collection Time: 08/08/17  2:39 PM  Result Value Ref Range   Procalcitonin 0.25 ng/mL    Comment:        Interpretation: PCT (Procalcitonin) <= 0.5 ng/mL: Systemic infection (sepsis) is not likely. Local bacterial infection is possible. (NOTE)         ICU PCT Algorithm               Non ICU PCT Algorithm    ----------------------------     ------------------------------         PCT < 0.25 ng/mL                 PCT < 0.1 ng/mL     Stopping of  antibiotics            Stopping of antibiotics       strongly encouraged.               strongly encouraged.    ----------------------------     ------------------------------  PCT level decrease by               PCT < 0.25 ng/mL       >= 80% from peak PCT       OR PCT 0.25 - 0.5 ng/mL          Stopping of antibiotics                                             encouraged.     Stopping of antibiotics           encouraged.    ----------------------------     ------------------------------       PCT level decrease by              PCT >= 0.25 ng/mL       < 80% from peak PCT        AND PCT >= 0.5 ng/mL            Continuin g antibiotics                                              encouraged.       Continuing antibiotics            encouraged.    ----------------------------     ------------------------------     PCT level increase compared          PCT > 0.5 ng/mL         with peak PCT AND          PCT >= 0.5 ng/mL             Escalation of antibiotics                                          strongly encouraged.      Escalation of antibiotics        strongly encouraged.   Protime-INR     Status: None   Collection Time: 08/08/17  2:39 PM  Result Value Ref Range   Prothrombin Time 14.1 11.4 - 15.2 seconds   INR 1.10   APTT     Status: None   Collection Time: 08/08/17  2:39 PM  Result Value Ref Range   aPTT 30 24 - 36 seconds  Valproic acid level     Status: Abnormal   Collection Time: 08/08/17  2:39 PM  Result Value Ref Range   Valproic Acid Lvl 13 (L) 50.0 - 100.0 ug/mL  Magnesium     Status: None   Collection Time: 08/08/17  2:39 PM  Result Value Ref Range   Magnesium 1.8 1.7 - 2.4 mg/dL  Urinalysis, Routine w reflex microscopic     Status: None   Collection Time: 08/08/17  3:00 PM  Result Value Ref Range   Color, Urine YELLOW YELLOW   APPearance CLEAR CLEAR   Specific Gravity, Urine 1.012 1.005 - 1.030   pH 5.0 5.0 - 8.0   Glucose, UA NEGATIVE NEGATIVE mg/dL    Hgb urine dipstick NEGATIVE NEGATIVE   Bilirubin Urine NEGATIVE NEGATIVE   Ketones, ur NEGATIVE NEGATIVE mg/dL   Protein,  ur NEGATIVE NEGATIVE mg/dL   Nitrite NEGATIVE NEGATIVE   Leukocytes, UA NEGATIVE NEGATIVE  Lactic acid, plasma     Status: None   Collection Time: 08/08/17  4:31 PM  Result Value Ref Range   Lactic Acid, Venous 0.9 0.5 - 1.9 mmol/L  CBC     Status: Abnormal   Collection Time: 08/09/17  4:40 AM  Result Value Ref Range   WBC 6.5 4.0 - 10.5 K/uL   RBC 3.88 3.87 - 5.11 MIL/uL   Hemoglobin 11.9 (L) 12.0 - 15.0 g/dL    Comment: DELTA CHECK NOTED   HCT 37.2 36.0 - 46.0 %   MCV 95.9 78.0 - 100.0 fL   MCH 30.7 26.0 - 34.0 pg   MCHC 32.0 30.0 - 36.0 g/dL   RDW 16.1 (H) 11.5 - 15.5 %   Platelets 110 (L) 150 - 400 K/uL    Comment: SPECIMEN CHECKED FOR CLOTS PLATELET COUNT CONFIRMED BY SMEAR   Comprehensive metabolic panel     Status: Abnormal   Collection Time: 08/09/17  4:40 AM  Result Value Ref Range   Sodium 140 135 - 145 mmol/L   Potassium 4.6 3.5 - 5.1 mmol/L    Comment: DELTA CHECK NOTED SLIGHT HEMOLYSIS    Chloride 108 101 - 111 mmol/L   CO2 26 22 - 32 mmol/L   Glucose, Bld 220 (H) 65 - 99 mg/dL   BUN 12 6 - 20 mg/dL   Creatinine, Ser 0.91 0.44 - 1.00 mg/dL   Calcium 8.2 (L) 8.9 - 10.3 mg/dL   Total Protein 6.9 6.5 - 8.1 g/dL   Albumin 3.2 (L) 3.5 - 5.0 g/dL   AST 23 15 - 41 U/L   ALT 21 14 - 54 U/L   Alkaline Phosphatase 61 38 - 126 U/L   Total Bilirubin 1.0 0.3 - 1.2 mg/dL   GFR calc non Af Amer >60 >60 mL/min   GFR calc Af Amer >60 >60 mL/min    Comment: (NOTE) The eGFR has been calculated using the CKD EPI equation. This calculation has not been validated in all clinical situations. eGFR's persistently <60 mL/min signify possible Chronic Kidney Disease.    Anion gap 6 5 - 15  Comprehensive metabolic panel     Status: Abnormal   Collection Time: 08/10/17  6:31 AM  Result Value Ref Range   Sodium 142 135 - 145 mmol/L   Potassium 4.2 3.5 -  5.1 mmol/L   Chloride 105 101 - 111 mmol/L   CO2 28 22 - 32 mmol/L   Glucose, Bld 257 (H) 65 - 99 mg/dL   BUN 12 6 - 20 mg/dL   Creatinine, Ser 0.85 0.44 - 1.00 mg/dL   Calcium 9.0 8.9 - 10.3 mg/dL   Total Protein 7.0 6.5 - 8.1 g/dL   Albumin 3.1 (L) 3.5 - 5.0 g/dL   AST 20 15 - 41 U/L   ALT 24 14 - 54 U/L   Alkaline Phosphatase 58 38 - 126 U/L   Total Bilirubin 0.4 0.3 - 1.2 mg/dL   GFR calc non Af Amer >60 >60 mL/min   GFR calc Af Amer >60 >60 mL/min    Comment: (NOTE) The eGFR has been calculated using the CKD EPI equation. This calculation has not been validated in all clinical situations. eGFR's persistently <60 mL/min signify possible Chronic Kidney Disease.    Anion gap 9 5 - 15     Lipid Panel  No results found for: CHOL, TRIG, HDL, CHOLHDL,  VLDL, LDLCALC, LDLDIRECT   No results found for: HGBA1C   Lab Results  Component Value Date   CREATININE 0.85 08/10/2017     HPI :*  56 year old female with a history of HIV, disability, schizophrenia, polysubstance abuse, dyslipidemia ,smoker followed by infectious disease at [facility], who presents to ED today because of one day's worth of productive cough, subjective fever, chills, nausea with one episode of vomiting last night. Patient had also received her insurance to vaccine yesterday. Patient is a resident of group home and is brought in by her caretaker. Initially hypotensive with systolic blood pressure in the 80s, lactic acid 2.45, potassium 3.2, creatinine 1.3, chest x-ray opacity in the right base. Patient admitted for possible sepsis physiology due to right lower lobe pneumonia  HOSPITAL COURSE:   Sepsis resulting from CAP /aspiration pneumonia -Fever, tachycardia with elevated lactate to 2.45 on admission -Sepsis protocol initiated in the ER Influenza panel negative -Possibly has CAP vs aspiration Initiated on Broad-spectrum antibiotics as the patient lives in a group home Hydrated aggressively with IV  fluids -Blood culture no growth so far Blood pressure improved, hemodynamically stable, patient was able to wean off of oxygen Patient is now being discharged on Vantin 7 days   Tobacco -Encourage cessation. This was discussed with the patient and should be reviewed on an ongoing basis.  -Patch ordered.  HIV  CD4 count still pending -Followed at [place] home meds: Epzicom, Atazanavir, Raltegravir  Schizophrenia with ID -Continue home meds: , Depakote, , Seroquel, hold Haldol due to low potassium, magnesium 1.8 Depakote level 13   Hypokalemia Repleted Patient also started on magnesium supplementation as the patient is a QTC prolonging drugs at home   Discharge Exam:  Blood pressure (!) 152/96, pulse 78, temperature 98.7 F (37.1 C), temperature source Oral, resp. rate 16, height '5\' 11"'$  (1.803 m), weight 110.1 kg (242 lb 11.6 oz), SpO2 97 %.   Cardiovascular system: S1 & S2 heard, RRR. No JVD, murmurs, rubs, gallops or clicks. No pedal edema. Gastrointestinal system: Abdomen is nondistended, soft and nontender. No organomegaly or masses felt. Normal bowel sounds heard. Central nervous system: Alert and oriented. No focal neurological deficits. Extremities: Symmetric 5 x 5 power. Skin: No rashes, lesions or ulcers Psychiatry: Judgement and insight appear normal. Mood & affect appropriate   Follow-up Information    Rosita Fire, MD. Call.   Specialty:  Internal Medicine Why:  Hospital follow-up in 3-5 days Contact information: [place] [address] [phone]           Signed: Reyne Dumas 08/10/2017, 8:28 AM        Time spent >1 hour

## 2017-08-10 NOTE — Evaluation (Signed)
Physical Therapy Evaluation Patient Details Name: Karina Clarke MRN: [id number] DOB: 1961-10-08 Today's Date: 08/10/2017   History of Present Illness   56 year old female with a history of HIV, disability, schizophrenia, polysubstance abuse, dyslipidemia  , smoker followed by infectious disease at [facility], who presents to ED today because of one day's worth of productive cough, subjective fever, chills, nausea with one episode of vomiting last night. Patient had also received her insurance to vaccine yesterday. Patient is a resident of group home and is brought in by her caretaker  Clinical Impression  Pt received lying in bed and was agreeable to PT evaluation. Pt from group home where she is independent with dressing and bathing but requires assistance for IADLs. Pt reported she was mod I for community ambulation with R loftstrand crutch. Pt currently independent with bed mobility, supv-min guard for transfers and amb in room 65ft with R loft strand crutch due to pt's c/o dizziness. Upon initial sit > stand from bed, pt stated she was dizzy and needed to sit back down for a short rest break. Pt then able to stand up again and denied any dizzniess; she took a few steps to the bathroom door and stated "can I sit back down? I'm dizzy." PT gently encouraged pt to rest a minute to let the dizziness subside and then continue walking. Pt agreed and then ambulated another few feet to the doorway and pt stated again "can I sit back down now? I'm dizzy. Please." Pt returned to bed without evidenced of unsteadiness. Even though mobility assessment limited, pt appears to be at baseline level of functioning and does not require any acute or f/u PT services. PT signing off for now.     Follow Up Recommendations No PT follow up    Equipment Recommendations  None recommended by PT    Recommendations for Other Services       Precautions / Restrictions Precautions Precautions: None Precaution Comments: c/o  dizziness throughout entire session but relatively steady during transfers and amb      Mobility  Bed Mobility Overal bed mobility: Independent                Transfers Overall transfer level: Needs assistance Equipment used: Lofstrands (R loftstrand only) Transfers: Sit to/from Stand Sit to Stand: Supervision;Min guard         General transfer comment: d/t c/o dizzniess  Ambulation/Gait Ambulation/Gait assistance: Supervision;Min guard Ambulation Distance (Feet): 15 Feet Assistive device: Lofstrands (R loftstrand only) Gait Pattern/deviations: Step-through pattern;Decreased stride length   Gait velocity interpretation: Below normal speed for age/gender General Gait Details: supv-min guard d/t c/o dizziness; furniture reaching during amb bed to/from door; upon standing up, pt immediately wanted to return to bed due to c/o dizziness; pt gently encouraged pt to rest for a minute to let dizziness subside and then continue walking so that PT could make appropriate d/c recommendations -- pt ambualted a couple more feet and then asked to return to bed again due to dizzniess. Pt amb safely back to bed and returned to R sidelying position  Stairs            Wheelchair Mobility    Modified Rankin (Stroke Patients Only)       Balance Overall balance assessment: Needs assistance Sitting-balance support: Feet supported Sitting balance-Leahy Scale: Good     Standing balance support: Single extremity supported Standing balance-Leahy Scale: Fair Standing balance comment: R loftstrand crutch  Pertinent Vitals/Pain Pain Assessment: 0-10 Pain Score: 9  Pain Location: R arm after getting medicine in IV Pain Descriptors / Indicators: Aching Pain Intervention(s): Limited activity within patient's tolerance;Monitored during session;Repositioned    Home Living Family/patient expects to be discharged to:: Group home                       Prior Function Level of Independence: Needs assistance   Gait / Transfers Assistance Needed: independent with R loftstrand crutch for community distances PTA; pt states she has been using it since 1985 due to her R hip being "messed up real bad."   ADL's / Homemaking Assistance Needed: pt dresses and bathes self, caretaker fixes pt's food and assist pt out in the community with shopping.        Hand Dominance   Dominant Hand: Right    Extremity/Trunk Assessment   Upper Extremity Assessment Upper Extremity Assessment: Overall WFL for tasks assessed    Lower Extremity Assessment Lower Extremity Assessment: Overall WFL for tasks assessed    Cervical / Trunk Assessment Cervical / Trunk Assessment: Normal  Communication   Communication: No difficulties  Cognition Arousal/Alertness: Awake/alert Behavior During Therapy: WFL for tasks assessed/performed Overall Cognitive Status: Within Functional Limits for tasks assessed                                        General Comments      Exercises     Assessment/Plan    PT Assessment Patent does not need any further PT services  PT Problem List Decreased activity tolerance;Decreased safety awareness;Decreased cognition;Decreased strength;Decreased balance       PT Treatment Interventions Gait training;Functional mobility training;Therapeutic activities;Therapeutic exercise;Patient/family education    PT Goals (Current goals can be found in the Care Plan section)  Acute Rehab PT Goals Patient Stated Goal: to get better PT Goal Formulation: With patient Time For Goal Achievement: 08/11/17 Potential to Achieve Goals: Good    Frequency     Barriers to discharge        Co-evaluation               AM-PAC PT "6 Clicks" Daily Activity  Outcome Measure Difficulty turning over in bed (including adjusting bedclothes, sheets and blankets)?: None Difficulty moving from lying on back to  sitting on the side of the bed? : None Difficulty sitting down on and standing up from a chair with arms (e.g., wheelchair, bedside commode, etc,.)?: None Help needed moving to and from a bed to chair (including a wheelchair)?: None Help needed walking in hospital room?: A Little Help needed climbing 3-5 steps with a railing? : A Little 6 Click Score: 22    End of Session Equipment Utilized During Treatment: Gait belt Activity Tolerance: Patient tolerated treatment well;No increased pain;Other (comment) (assessment limited due to pt wishing to return to bed immediately after exiting; pt asked multiple times and stated she was dizzy and did not wish to continue ambulation) Patient left: in bed;with call bell/phone within reach;with bed alarm set Nurse Communication: Mobility status PT Visit Diagnosis: Difficulty in walking, not elsewhere classified (R26.2);Other abnormalities of gait and mobility (R26.89)    Time: 9628-3662 PT Time Calculation (min) (ACUTE ONLY): 14 min   Charges:   PT Evaluation $PT Eval Low Complexity: 1 Low     PT G Codes:   PT G-Codes **NOT FOR INPATIENT  CLASS** Functional Assessment Tool Used: AM-PAC 6 Clicks Basic Mobility;Clinical judgement Functional Limitation: Mobility: Walking and moving around Mobility: Walking and Moving Around Current Status (N8871): At least 1 percent but less than 20 percent impaired, limited or restricted Mobility: Walking and Moving Around Goal Status [phone]): At least 1 percent but less than 20 percent impaired, limited or restricted Mobility: Walking and Moving Around Discharge Status [phone]): At least 1 percent but less than 20 percent impaired, limited or restricted      Geraldine Solar PT, DPT

## 2017-08-10 NOTE — NC FL2 (Signed)
[place] LEVEL OF CARE SCREENING TOOL     IDENTIFICATION  Patient Name: Karina Clarke Birthdate: 12-06-1960 Sex: female Admission Date (Current Location): 08/08/2017  [employer] and [place] Number:  [employer] and Address:  [place] [address] [place]      Provider Number: [id number]  Attending Physician Name and Address:  Reyne Dumas, MD  Relative Name and Phone Number:       Current Level of Care: Hospital Recommended Level of Care: Other (Comment) (Group Home) Prior Approval Number:    Date Approved/Denied:   PASRR Number:    Discharge Plan: Domiciliary (Rest home)    Current Diagnoses: Patient Active Problem List   Diagnosis Date Noted  . Influenza 12/26/2016  . Sepsis ([place]) 12/26/2016  . CAP (community acquired pneumonia) 12/26/2016  . Tobacco use disorder 12/26/2016  . HIV (human immunodeficiency virus infection) ([place]) 12/26/2016  . Schizophrenia ([place]) 12/26/2016  . Personality disorder 08/22/2016  . Encounter for screening colonoscopy 05/24/2015    Orientation RESPIRATION BLADDER Height & Weight     Self, Place  Normal Continent Weight: 242 lb 11.6 oz (110.1 kg) Height:  5\' 11"  (180.3 cm)  BEHAVIORAL SYMPTOMS/MOOD NEUROLOGICAL BOWEL NUTRITION STATUS      Continent Diet (Dysphagia 3 mechanical soft with thin liquids)  AMBULATORY STATUS COMMUNICATION OF NEEDS Skin   Supervision Verbally Normal                       Personal Care Assistance Level of Assistance  Bathing, Feeding, Dressing Bathing Assistance: Limited assistance Feeding assistance: Independent Dressing Assistance: Limited assistance     Functional Limitations Info  Sight, Hearing, Speech Sight Info: Adequate Hearing Info: Adequate Speech Info: Adequate (No teeth - baseline)    SPECIAL CARE FACTORS FREQUENCY                       Contractures Contractures Info: Not present    Additional Factors Info  Code  Status, Allergies, Psychotropic Code Status Info: Full Code Allergies Info: Asa Aspirin Psychotropic Info: Depakote / Seroquel         Current Medications (08/10/2017):  This is the current hospital active medication list Current Facility-Administered Medications  Medication Dose Route Frequency Provider Last Rate Last Dose  . abacavir (ZIAGEN) tablet 600 mg  600 mg Oral Daily Reyne Dumas, MD   600 mg at 08/10/17 0950   And  . lamiVUDine (EPIVIR) tablet 300 mg  300 mg Oral Daily Reyne Dumas, MD   300 mg at 08/10/17 0951  . acetaminophen (TYLENOL) tablet 650 mg  650 mg Oral Q6H PRN Reyne Dumas, MD      . albuterol (PROVENTIL) (2.5 MG/3ML) 0.083% nebulizer solution 2.5 mg  2.5 mg Nebulization BID PRN Reyne Dumas, MD      . atazanavir (REYATAZ) capsule 400 mg  400 mg Oral Q breakfast Reyne Dumas, MD   400 mg at 08/10/17 0736  . atorvastatin (LIPITOR) tablet 10 mg  10 mg Oral BID Reyne Dumas, MD   10 mg at 08/10/17 0951  . divalproex (DEPAKOTE) DR tablet 500 mg  500 mg Oral BID Reyne Dumas, MD   500 mg at 08/09/17 2126  . enoxaparin (LOVENOX) injection 50 mg  50 mg Subcutaneous Q24H Reyne Dumas, MD   50 mg at 08/10/17 0950  . hydrOXYzine (ATARAX/VISTARIL) tablet 25 mg  25 mg Oral QHS Reyne Dumas, MD   25 mg  at 08/09/17 2200  . menthol-cetylpyridinium (CEPACOL) lozenge 3 mg  1 lozenge Oral PRN Reyne Dumas, MD   3 mg at 08/09/17 1830  . methylPREDNISolone sodium succinate (SOLU-MEDROL) 125 mg/2 mL injection 60 mg  60 mg Intravenous Q6H Abrol, Nayana, MD   60 mg at 08/10/17 0736  . nicotine (NICODERM CQ - dosed in mg/24 hours) patch 14 mg  14 mg Transdermal Daily Reyne Dumas, MD   14 mg at 08/10/17 0951  . ondansetron (ZOFRAN) injection 4 mg  4 mg Intravenous Q6H PRN Abrol, Ascencion Dike, MD      . piperacillin-tazobactam (ZOSYN) IVPB 3.375 g  3.375 g Intravenous Q8H Abrol, Ascencion Dike, MD 12.5 mL/hr at 08/10/17 0949 3.375 g at 08/10/17 0949  . QUEtiapine (SEROQUEL) tablet 200 mg  200 mg  Oral BID Reyne Dumas, MD   200 mg at 08/09/17 2126  . raltegravir (ISENTRESS) tablet 400 mg  400 mg Oral BID Reyne Dumas, MD   400 mg at 08/10/17 0950  . valACYclovir (VALTREX) tablet 1,000 mg  1,000 mg Oral Daily Reyne Dumas, MD   1,000 mg at 08/10/17 0951  . vancomycin (VANCOCIN) IVPB 1000 mg/200 mL premix  1,000 mg Intravenous Q12H Reyne Dumas, MD   Stopped at 08/10/17 0300  . zolpidem (AMBIEN) tablet 5 mg  5 mg Oral QHS Reyne Dumas, MD   5 mg at 08/09/17 2126     Discharge Medications: Please see discharge summary for a list of discharge medications.  Relevant Imaging Results:  Relevant Lab Results:   Additional Information SSN [id number]   [name], [place]

## 2017-08-10 NOTE — Progress Notes (Signed)
SATURATION QUALIFICATIONS: (This note is used to comply with regulatory documentation for home oxygen)  Patient Saturations on Room Air at Rest = 95%  Patient Saturations on Room Air while Ambulating = 94%  Patient Saturations on 2 Liters of oxygen while Ambulating = N/A  Please briefly explain why patient needs home oxygen:

## 2017-08-10 NOTE — Clinical Social Work Note (Signed)
Clinical Social Worker received notification from nurse that patient was from [facility] [phone]) and was ready for discharge.  No full assessment completed prior to discharge - patient legal guardian agreeable to discharge back to group home.  Per Ms. Wallace at the group home, patient will be picked up by group home staff at 14:00 today.  RN updated.  FL2 completed.  RN to print H&P, Discharge Summary, and FL2 for facility upon their arrival.  CSW available for any additional support if needed.  [name], LCSW (Covering [place]) [phone]

## 2017-08-10 NOTE — Progress Notes (Signed)
Patient is to be discharged back to group home and in stable condition. IV and telemetry removed. Patient aware and verbalized understanding of discharge. [facility] will transport patient at 1400. Report given. Awaiting transportation.   Celestia Khat, RN

## 2017-08-11 LAB — CD4/CD8 (T-HELPER/T-SUPPRESSOR CELL)
CD4 ABSOLUTE: 150 /uL — AB (ref 500–1900)
CD4%: 11 % — AB (ref 30.0–60.0)
CD8 T Cell Abs: 740 /uL (ref 230–1000)
CD8tox: 58 % — ABNORMAL HIGH (ref 15.0–40.0)
RATIO: 0.2 — AB (ref 1.0–3.0)
TOTAL LYMPHOCYTE COUNT: 1270 /uL (ref 1000–4000)

## 2017-08-13 ENCOUNTER — Ambulatory Visit: Payer: Medicaid Other | Admitting: Orthopaedic Surgery

## 2017-08-13 LAB — CULTURE, BLOOD (ROUTINE X 2)
Culture: NO GROWTH
Culture: NO GROWTH
SPECIAL REQUESTS: ADEQUATE
Special Requests: ADEQUATE

## 2017-08-28 ENCOUNTER — Encounter: Payer: Self-pay | Admitting: Orthopaedic Surgery

## 2017-08-28 ENCOUNTER — Ambulatory Visit (INDEPENDENT_AMBULATORY_CARE_PROVIDER_SITE_OTHER): Payer: Medicaid Other | Admitting: Orthopaedic Surgery

## 2017-08-28 DIAGNOSIS — M25561 Pain in right knee: Secondary | ICD-10-CM

## 2017-08-28 DIAGNOSIS — F17210 Nicotine dependence, cigarettes, uncomplicated: Secondary | ICD-10-CM | POA: Diagnosis not present

## 2017-08-28 DIAGNOSIS — G8929 Other chronic pain: Secondary | ICD-10-CM

## 2017-08-28 NOTE — Progress Notes (Signed)
CC:  I have pain of my right knee. I would like an injection.  The patient has chronic pain of the right knee.  There is no recent trauma.  There is no redness.  Injections in the past have helped.  The knee has no redness, has an effusion and crepitus present.  ROM of the right knee is 0-105.  Impression:  Chronic knee pain right  Return: 6 weeks  PROCEDURE NOTE:  The patient requests injections of the right knee , verbal consent was obtained.  The right knee was prepped appropriately after time out was performed.   Sterile technique was observed and injection of 1 cc of Depo-Medrol 40 mg with several cc's of plain xylocaine. Anesthesia was provided by ethyl chloride and a 20-gauge needle was used to inject the knee area. The injection was tolerated well.  A band aid dressing was applied.  The patient was advised to apply ice later today and tomorrow to the injection sight as needed.  She continues to smoke. She is not willing to stop.  Encounter Diagnoses  Name Primary?  . Chronic pain of right knee Yes  . Cigarette nicotine dependence without complication    Forms for rest home completed.  Electronically Signed Sanjuana Kava, MD 10/4/201810:24 AM

## 2017-08-28 NOTE — Patient Instructions (Signed)
Steps to Quit Smoking Smoking tobacco can be bad for your health. It can also affect almost every organ in your body. Smoking puts you and people around you at risk for many serious Karina Clarke-lasting (chronic) diseases. Quitting smoking is hard, but it is one of the best things that you can do for your health. It is never too late to quit. What are the benefits of quitting smoking? When you quit smoking, you lower your risk for getting serious diseases and conditions. They can include:  Lung cancer or lung disease.  Heart disease.  Stroke.  Heart attack.  Not being able to have children (infertility).  Weak bones (osteoporosis) and broken bones (fractures).  If you have coughing, wheezing, and shortness of breath, those symptoms may get better when you quit. You may also get sick less often. If you are pregnant, quitting smoking can help to lower your chances of having a baby of low birth weight. What can I do to help me quit smoking? Talk with your doctor about what can help you quit smoking. Some things you can do (strategies) include:  Quitting smoking totally, instead of slowly cutting back how much you smoke over a period of time.  Going to in-person counseling. You are more likely to quit if you go to many counseling sessions.  Using resources and support systems, such as: ? Online chats with a counselor. ? Phone quitlines. ? Printed self-help materials. ? Support groups or group counseling. ? Text messaging programs. ? Mobile phone apps or applications.  Taking medicines. Some of these medicines may have nicotine in them. If you are pregnant or breastfeeding, do not take any medicines to quit smoking unless your doctor says it is okay. Talk with your doctor about counseling or other things that can help you.  Talk with your doctor about using more than one strategy at the same time, such as taking medicines while you are also going to in-person counseling. This can help make  quitting easier. What things can I do to make it easier to quit? Quitting smoking might feel very hard at first, but there is a lot that you can do to make it easier. Take these steps:  Talk to your family and friends. Ask them to support and encourage you.  Call phone quitlines, reach out to support groups, or work with a counselor.  Ask people who smoke to not smoke around you.  Avoid places that make you want (trigger) to smoke, such as: ? Bars. ? Parties. ? Smoke-break areas at work.  Spend time with people who do not smoke.  Lower the stress in your life. Stress can make you want to smoke. Try these things to help your stress: ? Getting regular exercise. ? Deep-breathing exercises. ? Yoga. ? Meditating. ? Doing a body scan. To do this, close your eyes, focus on one area of your body at a time from head to toe, and notice which parts of your body are tense. Try to relax the muscles in those areas.  Download or buy apps on your mobile phone or tablet that can help you stick to your quit plan. There are many free apps, such as QuitGuide from the CDC (Centers for Disease Control and Prevention). You can find more support from smokefree.gov and other websites.  This information is not intended to replace advice given to you by your health care provider. Make sure you discuss any questions you have with your health care provider. Document Released: 09/07/2009 Document   Revised: 07/09/2016 Document Reviewed: 03/28/2015 Elsevier Interactive Patient Education  2018 Elsevier Inc.  

## 2017-09-03 ENCOUNTER — Emergency Department (HOSPITAL_COMMUNITY)
Admission: EM | Admit: 2017-09-03 | Discharge: 2017-09-03 | Disposition: A | Discharge status: Home / Self Care | Payer: Medicaid Other | Attending: Emergency Medicine | Admitting: Emergency Medicine

## 2017-09-03 ENCOUNTER — Encounter (HOSPITAL_COMMUNITY): Payer: Self-pay

## 2017-09-03 DIAGNOSIS — N3000 Acute cystitis without hematuria: Secondary | ICD-10-CM | POA: Insufficient documentation

## 2017-09-03 DIAGNOSIS — R339 Retention of urine, unspecified: Secondary | ICD-10-CM | POA: Diagnosis present

## 2017-09-03 DIAGNOSIS — I10 Essential (primary) hypertension: Secondary | ICD-10-CM | POA: Diagnosis not present

## 2017-09-03 LAB — URINALYSIS, ROUTINE W REFLEX MICROSCOPIC
Bilirubin Urine: NEGATIVE
Glucose, UA: NEGATIVE mg/dL
Hgb urine dipstick: NEGATIVE
Ketones, ur: NEGATIVE mg/dL
Nitrite: NEGATIVE
Protein, ur: NEGATIVE mg/dL
Specific Gravity, Urine: 1.025 (ref 1.005–1.030)
pH: 6 (ref 5.0–8.0)

## 2017-09-03 LAB — BASIC METABOLIC PANEL
ANION GAP: 9 (ref 5–15)
BUN: 18 mg/dL (ref 6–20)
CO2: 30 mmol/L (ref 22–32)
Calcium: 9.2 mg/dL (ref 8.9–10.3)
Chloride: 99 mmol/L — ABNORMAL LOW (ref 101–111)
Creatinine, Ser: 1 mg/dL (ref 0.44–1.00)
GFR calc Af Amer: >60 mL/min (ref >60)
GLUCOSE: 96 mg/dL (ref 65–99)
POTASSIUM: 3.9 mmol/L (ref 3.5–5.1)
Sodium: 138 mmol/L (ref 135–145)

## 2017-09-03 LAB — CBC WITH DIFFERENTIAL/PLATELET
BASOS ABS: 0 10*3/uL (ref 0.0–0.1)
Basophils Relative: 0 %
Eosinophils Absolute: 0.2 10*3/uL (ref 0.0–0.7)
Eosinophils Relative: 2 %
HEMATOCRIT: 41.2 % (ref 36.0–46.0)
HEMOGLOBIN: 13.1 g/dL (ref 12.0–15.0)
LYMPHS PCT: 62 %
Lymphs Abs: 4.6 10*3/uL — ABNORMAL HIGH (ref 0.7–4.0)
MCH: 30.7 pg (ref 26.0–34.0)
MCHC: 31.8 g/dL (ref 30.0–36.0)
MCV: 96.5 fL (ref 78.0–100.0)
MONO ABS: 0.5 10*3/uL (ref 0.1–1.0)
Monocytes Relative: 7 %
NEUTROS ABS: 2.2 10*3/uL (ref 1.7–7.7)
Neutrophils Relative %: 29 %
Platelets: 167 10*3/uL (ref 150–400)
RBC: 4.27 MIL/uL (ref 3.87–5.11)
RDW: 15.8 % — AB (ref 11.5–15.5)
WBC: 7.5 10*3/uL (ref 4.0–10.5)

## 2017-09-03 MED ORDER — CEPHALEXIN 500 MG PO CAPS: 500.0000 mg | ORAL_CAPSULE | Freq: Four times a day (QID) | ORAL | 0 refills | Status: DC

## 2017-09-03 MED ORDER — CEPHALEXIN 500 MG PO CAPS
500.0000 mg | ORAL_CAPSULE | Freq: Once | ORAL | Status: AC
  Administered 2017-09-03: 500 mg via ORAL
  Filled 2017-09-03: qty 1

## 2017-09-03 MED ORDER — SODIUM CHLORIDE 0.9 % IV BOLUS (SEPSIS)
1000.0000 mL | Freq: Once | INTRAVENOUS | Status: AC
  Administered 2017-09-03: 1000 mL via INTRAVENOUS

## 2017-09-03 NOTE — Discharge Instructions (Signed)
Take the antibiotics as prescribed, follow-up with her primary doctor to make sure the infection has resolved

## 2017-09-03 NOTE — ED Triage Notes (Signed)
Pt reports urinary retention.  Pt resident of [facility].   Caregiver says pt has only voided small amounts since yesterday.  Pt c/o feeling like needs to void.

## 2017-09-03 NOTE — ED Provider Notes (Signed)
[facility] DEPT Provider Note   CSN: [id number] Arrival date & time: 09/03/17  1159     History   Chief Complaint Chief Complaint  Patient presents with  . Urinary Retention    HPI Karina Clarke is a 56 y.o. female. History provided by the patient and family member. She does have history of intellectual disability HPI Patient presents to the emergency room for evaluation of possible urinary retention or urinary infection. She resides The patient has been having decreased urine output over the last day or so. She has been urinating only small amounts and feels like she is not emptying her bladder appropriately. She has some burning when she urinates and also feels like she has a rash to the vaginal area.  Patient went to her primary doctor yesterday. They were unable to get a urine sample. They were concerned that her abdomen appeared distended and she may have urinary retention. Patient denies any vomiting or diarrhea. No fevers or chills. Past Medical History:  Diagnosis Date  . 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 Bloating    per sister  . Chronic right hip pain    sister states circulation problem in hip  . Family history of adverse reaction to anesthesia    sister has hx. of post-op N/V  . High cholesterol   . HIV (human immunodeficiency virus infection) ([place])   . Hypertension   . Limp   . Mild intellectual disability   . No natural teeth    does not wear her dentures  . Paranoid schizophrenia ([place])   . Personality disorder ([place])   . Schizophrenia [facility])     Patient Active Problem List   Diagnosis Date Noted  . Influenza 12/26/2016  . Sepsis ([place]) 12/26/2016  . CAP (community acquired pneumonia) 12/26/2016  . Tobacco use disorder 12/26/2016  . HIV (human immunodeficiency virus infection) ([place]) 12/26/2016  . Schizophrenia ([place]) 12/26/2016  . Personality disorder ([place]) 08/22/2016  . Encounter for screening colonoscopy 05/24/2015    Past  Surgical History:  Procedure Laterality Date  . BREAST LUMPECTOMY WITH NEEDLE LOCALIZATION Left 07/28/2015   Procedure: BREAST LUMPECTOMY WITH NEEDLE LOCALIZATION;  Surgeon: Autumn Messing III, MD;  Location: [facility];  Service: General;  Laterality: Left;  . COLONOSCOPY WITH PROPOFOL N/A 06/13/2015   SLF: 1. the left colon is redundant 2. the examination was otherwise normal 3. small internal hemorrhoids 4. moderate sized external hemorrhoids  . HEMORRHOID SURGERY    . UMBILICAL HERNIA REPAIR      OB History    No data available       Home Medications    Prior to Admission medications   Medication Sig Start Date End Date Taking? Authorizing Provider  abacavir-lamiVUDine (EPZICOM) 600-300 MG per tablet Take 1 tablet by mouth daily.   Yes [provider]  albuterol (PROVENTIL HFA;VENTOLIN HFA) 108 (90 Base) MCG/ACT inhaler Inhale 1-2 puffs into the lungs 2 (two) times daily as needed for wheezing or shortness of breath.   Yes [provider]  atazanavir (REYATAZ) 200 MG capsule Take 400 mg by mouth daily.    Yes [provider]  atorvastatin (LIPITOR) 10 MG tablet Take 10 mg by mouth 2 (two) times daily.   Yes [provider]  divalproex (DEPAKOTE) 500 MG DR tablet Take 500 mg by mouth See admin instructions. Take 1 tablet (500 mg) by mouth at 4pm and 8pm daily   Yes [provider]  haloperidol (HALDOL) 10  MG tablet Take 20 mg by mouth 3 (three) times daily.    Yes [provider]  hydrOXYzine (VISTARIL) 25 MG capsule Take 25 mg by mouth at bedtime.    Yes [provider]  Magnesium Oxide 400 MG CAPS Take 1 capsule (400 mg total) by mouth every morning. 08/10/17  Yes Reyne Dumas, MD  polyethylene glycol (MIRALAX / GLYCOLAX) packet Take 17 g by mouth daily as needed.   Yes [provider]  QUEtiapine (SEROQUEL) 200 MG tablet Take 100 mg by mouth See admin instructions. Take 1 tablet (200 mg) by mouth with  supper and at bedtime daily   Yes [provider]  raltegravir (ISENTRESS) 400 MG tablet Take 400 mg by mouth 2 (two) times daily.   Yes [provider]  trihexyphenidyl (ARTANE) 2 MG tablet Take 2 mg by mouth 2 (two) times daily with a meal.   Yes [provider]  valACYclovir (VALTREX) 1000 MG tablet Take 1,000 mg by mouth daily.    Yes [provider]  zolpidem (AMBIEN) 10 MG tablet Take 5 mg by mouth at bedtime.    Yes [provider]  cephALEXin (KEFLEX) 500 MG capsule Take 1 capsule (500 mg total) by mouth 4 (four) times daily. 09/03/17   Dorie Rank, MD  haloperidol decanoate (HALDOL DECANOATE) 100 MG/ML injection Inject 200 mg into the muscle every 28 (twenty-eight) days. Last injection 06/09/15     [provider]    Family History Family History  Problem Relation Age of Onset  . Anesthesia problems Sister        post-op N/V    Social History Social History  Substance Use Topics  . Smoking status: Current Every Day Smoker    Packs/day: 1.00    Years: 18.00    Types: Cigarettes  . Smokeless tobacco: Never Used  . Alcohol use No     Allergies   Asa [aspirin]   Review of Systems Review of Systems  All other systems reviewed and are negative.    Physical Exam Updated Vital Signs BP 128/89 (BP Location: Right Arm)   Pulse (!) 101   Temp 98.3 F (36.8 C) (Oral)   Resp 18   Ht 1.803 m (5\' 11" )   Wt 109.8 kg (242 lb)   SpO2 92%   BMI 33.75 kg/m   Physical Exam  Constitutional: She appears well-developed and well-nourished. No distress.  HENT:  Head: Normocephalic and atraumatic.  Right Ear: External ear normal.  Left Ear: External ear normal.  Eyes: Conjunctivae are normal. Right eye exhibits no discharge. Left eye exhibits no discharge. No scleral icterus.  Neck: Neck supple. No tracheal deviation present.  Cardiovascular: Normal rate, regular rhythm and intact distal pulses.   Pulmonary/Chest: Effort  normal and breath sounds normal. No stridor. No respiratory distress. She has no wheezes. She has no rales.  Abdominal: Soft. Bowel sounds are normal. She exhibits no distension. There is no tenderness. There is no rebound and no guarding.  Musculoskeletal: She exhibits no edema or tenderness.  Neurological: She is alert. She has normal strength. No cranial nerve deficit (no facial droop, extraocular movements intact, no slurred speech) or sensory deficit. She exhibits normal muscle tone. She displays no seizure activity. Coordination normal.  Skin: Skin is warm and dry. No rash noted.  Psychiatric: She has a normal mood and affect.  Nursing note and vitals reviewed.    ED Treatments / Results  Labs (all labs ordered are listed, but  only abnormal results are displayed) Labs Reviewed  BASIC METABOLIC PANEL - Abnormal; Notable for the following:       Result Value   Chloride 99 (*)    All other components within normal limits  CBC WITH DIFFERENTIAL/PLATELET - Abnormal; Notable for the following:    RDW 15.8 (*)    Lymphs Abs 4.6 (*)    All other components within normal limits  URINALYSIS, ROUTINE W REFLEX MICROSCOPIC - Abnormal; Notable for the following:    Leukocytes, UA SMALL (*)    Bacteria, UA RARE (*)    Squamous Epithelial / LPF 6-30 (*)    All other components within normal limits    EKG  EKG Interpretation None       Radiology No results found.  Procedures Procedures (including critical care time)  Medications Ordered in ED Medications  cephALEXin (KEFLEX) capsule 500 mg (not administered)  sodium chloride 0.9 % bolus 1,000 mL (1,000 mLs Intravenous New Bag/Given 09/03/17 1342)     Initial Impression / Assessment and Plan / ED Course  I have reviewed the triage vital signs and the nursing notes.  Pertinent labs & imaging results that were available during my care of the patient were reviewed by me and considered in my medical decision making (see chart for  details).  Clinical Course as of Sep 04 1419  Wed Sep 03, 2017  1405 Discussed with RN.  No rash noted in GU when obtaining urine specimen.  [JK]  1419 Bladder scan showed less than 30 mL of urine after the patient urinated  [JK]    Clinical Course User Index [JK] Dorie Rank, MD    Patient presented to the emergency room with urinary symptoms. She is having burning with urination and frequency. No evidence of urinary retention or renal failure based on her laboratory tests and ED evaluation.  Urinalysis today shows small leukocyte esterase and rare bacteria with squamous epithelial contamination. However symptoms are consistent with urinary tract infection. We'll discharge home on an oral course of antibiotics. Follow up with her primary care doctor.  Final Clinical Impressions(s) / ED Diagnoses   Final diagnoses:  Acute cystitis without hematuria    New Prescriptions New Prescriptions   CEPHALEXIN (KEFLEX) 500 MG CAPSULE    Take 1 capsule (500 mg total) by mouth 4 (four) times daily.     Dorie Rank, MD 09/03/17 1420

## 2017-09-05 LAB — URINE CULTURE

## 2017-10-09 ENCOUNTER — Encounter: Payer: Self-pay | Admitting: Orthopaedic Surgery

## 2017-10-09 ENCOUNTER — Ambulatory Visit: Payer: Medicaid Other | Admitting: Orthopaedic Surgery

## 2017-10-09 DIAGNOSIS — F17210 Nicotine dependence, cigarettes, uncomplicated: Secondary | ICD-10-CM

## 2017-10-09 DIAGNOSIS — G8929 Other chronic pain: Secondary | ICD-10-CM | POA: Diagnosis not present

## 2017-10-09 DIAGNOSIS — M25561 Pain in right knee: Secondary | ICD-10-CM | POA: Diagnosis not present

## 2017-10-09 NOTE — Patient Instructions (Signed)
Steps to Quit Smoking Smoking tobacco can be bad for your health. It can also affect almost every organ in your body. Smoking puts you and people around you at risk for many serious long-lasting (chronic) diseases. Quitting smoking is hard, but it is one of the best things that you can do for your health. It is never too late to quit. What are the benefits of quitting smoking? When you quit smoking, you lower your risk for getting serious diseases and conditions. They can include:  Lung cancer or lung disease.  Heart disease.  Stroke.  Heart attack.  Not being able to have children (infertility).  Weak bones (osteoporosis) and broken bones (fractures).  If you have coughing, wheezing, and shortness of breath, those symptoms may get better when you quit. You may also get sick less often. If you are pregnant, quitting smoking can help to lower your chances of having a baby of low birth weight. What can I do to help me quit smoking? Talk with your doctor about what can help you quit smoking. Some things you can do (strategies) include:  Quitting smoking totally, instead of slowly cutting back how much you smoke over a period of time.  Going to in-person counseling. You are more likely to quit if you go to many counseling sessions.  Using resources and support systems, such as: ? Online chats with a counselor. ? Phone quitlines. ? Printed self-help materials. ? Support groups or group counseling. ? Text messaging programs. ? Mobile phone apps or applications.  Taking medicines. Some of these medicines may have nicotine in them. If you are pregnant or breastfeeding, do not take any medicines to quit smoking unless your doctor says it is okay. Talk with your doctor about counseling or other things that can help you.  Talk with your doctor about using more than one strategy at the same time, such as taking medicines while you are also going to in-person counseling. This can help make  quitting easier. What things can I do to make it easier to quit? Quitting smoking might feel very hard at first, but there is a lot that you can do to make it easier. Take these steps:  Talk to your family and friends. Ask them to support and encourage you.  Call phone quitlines, reach out to support groups, or work with a counselor.  Ask people who smoke to not smoke around you.  Avoid places that make you want (trigger) to smoke, such as: ? Bars. ? Parties. ? Smoke-break areas at work.  Spend time with people who do not smoke.  Lower the stress in your life. Stress can make you want to smoke. Try these things to help your stress: ? Getting regular exercise. ? Deep-breathing exercises. ? Yoga. ? Meditating. ? Doing a body scan. To do this, close your eyes, focus on one area of your body at a time from head to toe, and notice which parts of your body are tense. Try to relax the muscles in those areas.  Download or buy apps on your mobile phone or tablet that can help you stick to your quit plan. There are many free apps, such as QuitGuide from the CDC (Centers for Disease Control and Prevention). You can find more support from smokefree.gov and other websites.  This information is not intended to replace advice given to you by your health care provider. Make sure you discuss any questions you have with your health care provider. Document Released: 09/07/2009 Document   Revised: 07/09/2016 Document Reviewed: 03/28/2015 Elsevier Interactive Patient Education  2018 Elsevier Inc.  

## 2017-10-09 NOTE — Progress Notes (Signed)
CC:  I have pain of my right knee. I would like an injection.  The patient has chronic pain of the right knee.  There is no recent trauma.  There is no redness.  Injections in the past have helped.  The knee has no redness, has an effusion and crepitus present.  ROM of the right knee is 0-105.  Impression:  Chronic knee pain right  Return: 6 weeks  PROCEDURE NOTE:  The patient requests injections of the right knee , verbal consent was obtained.  The right knee was prepped appropriately after time out was performed.   Sterile technique was observed and injection of 1 cc of Depo-Medrol 40 mg with several cc's of plain xylocaine. Anesthesia was provided by ethyl chloride and a 20-gauge needle was used to inject the knee area. The injection was tolerated well.  A band aid dressing was applied.  The patient was advised to apply ice later today and tomorrow to the injection sight as needed.  She continues to smoke.  She is not willing to cut back.  Encounter Diagnoses  Name Primary?  . Chronic pain of right knee Yes  . Cigarette nicotine dependence without complication    Electronically Signed Sanjuana Kava, MD 11/15/201811:10 AM

## 2017-10-12 ENCOUNTER — Emergency Department (HOSPITAL_COMMUNITY): Payer: Medicaid Other

## 2017-10-12 ENCOUNTER — Encounter (HOSPITAL_COMMUNITY): Payer: Self-pay

## 2017-10-12 ENCOUNTER — Inpatient Hospital Stay (HOSPITAL_COMMUNITY)
Admission: EM | Admit: 2017-10-12 | Discharge: 2017-10-27 | DRG: 974 | Payer: Medicaid Other | Attending: Internal Medicine | Admitting: Internal Medicine

## 2017-10-12 ENCOUNTER — Other Ambulatory Visit: Payer: Self-pay

## 2017-10-12 DIAGNOSIS — Z21 Asymptomatic human immunodeficiency virus [HIV] infection status: Secondary | ICD-10-CM | POA: Diagnosis present

## 2017-10-12 DIAGNOSIS — B20 Human immunodeficiency virus [HIV] disease: Secondary | ICD-10-CM | POA: Diagnosis present

## 2017-10-12 DIAGNOSIS — D649 Anemia, unspecified: Secondary | ICD-10-CM | POA: Diagnosis not present

## 2017-10-12 DIAGNOSIS — N179 Acute kidney failure, unspecified: Secondary | ICD-10-CM

## 2017-10-12 DIAGNOSIS — J441 Chronic obstructive pulmonary disease with (acute) exacerbation: Secondary | ICD-10-CM | POA: Diagnosis present

## 2017-10-12 DIAGNOSIS — E119 Type 2 diabetes mellitus without complications: Secondary | ICD-10-CM

## 2017-10-12 DIAGNOSIS — E861 Hypovolemia: Secondary | ICD-10-CM | POA: Diagnosis not present

## 2017-10-12 DIAGNOSIS — F70 Mild intellectual disabilities: Secondary | ICD-10-CM | POA: Diagnosis present

## 2017-10-12 DIAGNOSIS — I361 Nonrheumatic tricuspid (valve) insufficiency: Secondary | ICD-10-CM | POA: Diagnosis not present

## 2017-10-12 DIAGNOSIS — N170 Acute kidney failure with tubular necrosis: Secondary | ICD-10-CM | POA: Diagnosis not present

## 2017-10-12 DIAGNOSIS — I2721 Secondary pulmonary arterial hypertension: Secondary | ICD-10-CM | POA: Diagnosis present

## 2017-10-12 DIAGNOSIS — Z886 Allergy status to analgesic agent status: Secondary | ICD-10-CM | POA: Diagnosis not present

## 2017-10-12 DIAGNOSIS — J189 Pneumonia, unspecified organism: Secondary | ICD-10-CM

## 2017-10-12 DIAGNOSIS — J9601 Acute respiratory failure with hypoxia: Secondary | ICD-10-CM | POA: Diagnosis present

## 2017-10-12 DIAGNOSIS — B59 Pneumocystosis: Secondary | ICD-10-CM | POA: Diagnosis present

## 2017-10-12 DIAGNOSIS — E669 Obesity, unspecified: Secondary | ICD-10-CM | POA: Diagnosis present

## 2017-10-12 DIAGNOSIS — J440 Chronic obstructive pulmonary disease with acute lower respiratory infection: Secondary | ICD-10-CM | POA: Diagnosis present

## 2017-10-12 DIAGNOSIS — E871 Hypo-osmolality and hyponatremia: Secondary | ICD-10-CM | POA: Diagnosis not present

## 2017-10-12 DIAGNOSIS — A419 Sepsis, unspecified organism: Secondary | ICD-10-CM | POA: Diagnosis present

## 2017-10-12 DIAGNOSIS — F209 Schizophrenia, unspecified: Secondary | ICD-10-CM | POA: Diagnosis present

## 2017-10-12 DIAGNOSIS — Z79899 Other long term (current) drug therapy: Secondary | ICD-10-CM

## 2017-10-12 DIAGNOSIS — Y95 Nosocomial condition: Secondary | ICD-10-CM | POA: Diagnosis present

## 2017-10-12 DIAGNOSIS — E7800 Pure hypercholesterolemia, unspecified: Secondary | ICD-10-CM | POA: Diagnosis present

## 2017-10-12 DIAGNOSIS — E875 Hyperkalemia: Secondary | ICD-10-CM

## 2017-10-12 DIAGNOSIS — I10 Essential (primary) hypertension: Secondary | ICD-10-CM | POA: Diagnosis present

## 2017-10-12 DIAGNOSIS — F17200 Nicotine dependence, unspecified, uncomplicated: Secondary | ICD-10-CM | POA: Diagnosis present

## 2017-10-12 DIAGNOSIS — F17210 Nicotine dependence, cigarettes, uncomplicated: Secondary | ICD-10-CM | POA: Diagnosis present

## 2017-10-12 DIAGNOSIS — R0902 Hypoxemia: Secondary | ICD-10-CM

## 2017-10-12 DIAGNOSIS — Z9119 Patient's noncompliance with other medical treatment and regimen: Secondary | ICD-10-CM | POA: Diagnosis not present

## 2017-10-12 DIAGNOSIS — F609 Personality disorder, unspecified: Secondary | ICD-10-CM | POA: Diagnosis present

## 2017-10-12 DIAGNOSIS — F200 Paranoid schizophrenia: Secondary | ICD-10-CM | POA: Diagnosis present

## 2017-10-12 DIAGNOSIS — Z6833 Body mass index (BMI) 33.0-33.9, adult: Secondary | ICD-10-CM

## 2017-10-12 DIAGNOSIS — E1165 Type 2 diabetes mellitus with hyperglycemia: Secondary | ICD-10-CM | POA: Diagnosis not present

## 2017-10-12 LAB — URINALYSIS, ROUTINE W REFLEX MICROSCOPIC
Bilirubin Urine: NEGATIVE
GLUCOSE, UA: NEGATIVE mg/dL
Hgb urine dipstick: NEGATIVE
KETONES UR: NEGATIVE mg/dL
LEUKOCYTES UA: NEGATIVE
NITRITE: NEGATIVE
PH: 5 (ref 5.0–8.0)
Protein, ur: NEGATIVE mg/dL
SPECIFIC GRAVITY, URINE: 1.017 (ref 1.005–1.030)

## 2017-10-12 LAB — CBC WITH DIFFERENTIAL/PLATELET
BASOS PCT: 0 %
Basophils Absolute: 0 10*3/uL (ref 0.0–0.1)
Eosinophils Absolute: 0 10*3/uL (ref 0.0–0.7)
Eosinophils Relative: 0 %
HEMATOCRIT: 43.6 % (ref 36.0–46.0)
HEMOGLOBIN: 13.9 g/dL (ref 12.0–15.0)
LYMPHS ABS: 2.7 10*3/uL (ref 0.7–4.0)
LYMPHS PCT: 42 %
MCH: 30.9 pg (ref 26.0–34.0)
MCHC: 31.9 g/dL (ref 30.0–36.0)
MCV: 96.9 fL (ref 78.0–100.0)
MONO ABS: 1.2 10*3/uL — AB (ref 0.1–1.0)
MONOS PCT: 18 %
NEUTROS ABS: 2.5 10*3/uL (ref 1.7–7.7)
NEUTROS PCT: 40 %
Platelets: 154 10*3/uL (ref 150–400)
RBC: 4.5 MIL/uL (ref 3.87–5.11)
RDW: 16 % — AB (ref 11.5–15.5)
WBC: 6.4 10*3/uL (ref 4.0–10.5)

## 2017-10-12 LAB — COMPREHENSIVE METABOLIC PANEL
ALBUMIN: 4.2 g/dL (ref 3.5–5.0)
ALT: 22 U/L (ref 14–54)
ANION GAP: 10 (ref 5–15)
AST: 27 U/L (ref 15–41)
Alkaline Phosphatase: 108 U/L (ref 38–126)
BUN: 18 mg/dL (ref 6–20)
CHLORIDE: 103 mmol/L (ref 101–111)
CO2: 27 mmol/L (ref 22–32)
Calcium: 9.5 mg/dL (ref 8.9–10.3)
Creatinine, Ser: 1.25 mg/dL — ABNORMAL HIGH (ref 0.44–1.00)
GFR calc non Af Amer: 48 mL/min — ABNORMAL LOW (ref >60)
GFR, EST AFRICAN AMERICAN: 55 mL/min — AB (ref >60)
GLUCOSE: 112 mg/dL — AB (ref 65–99)
POTASSIUM: 3.6 mmol/L (ref 3.5–5.1)
SODIUM: 140 mmol/L (ref 135–145)
Total Bilirubin: 0.3 mg/dL (ref 0.3–1.2)
Total Protein: 8.1 g/dL (ref 6.5–8.1)

## 2017-10-12 LAB — I-STAT TROPONIN, ED
TROPONIN I, POC: 0.01 ng/mL (ref 0.00–0.08)
Troponin i, poc: 0.01 ng/mL (ref 0.00–0.08)

## 2017-10-12 LAB — MRSA PCR SCREENING: MRSA BY PCR: NEGATIVE

## 2017-10-12 LAB — PROTIME-INR
INR: 1.01
Prothrombin Time: 13.2 seconds (ref 11.4–15.2)

## 2017-10-12 LAB — INFLUENZA PANEL BY PCR (TYPE A & B)
INFLAPCR: NEGATIVE
INFLBPCR: NEGATIVE

## 2017-10-12 LAB — APTT: aPTT: 30 seconds (ref 24–36)

## 2017-10-12 LAB — I-STAT CG4 LACTIC ACID, ED
LACTIC ACID, VENOUS: 1.97 mmol/L — AB (ref 0.5–1.9)
Lactic Acid, Venous: 1.68 mmol/L (ref 0.5–1.9)

## 2017-10-12 LAB — PROCALCITONIN: Procalcitonin: <0.1 ng/mL

## 2017-10-12 MED ORDER — VANCOMYCIN HCL IN DEXTROSE 1-5 GM/200ML-% IV SOLN: 1000.0000 mg | Freq: Once | INTRAVENOUS | Status: DC

## 2017-10-12 MED ORDER — PIPERACILLIN-TAZOBACTAM 3.375 G IVPB
3.3750 g | Freq: Once | INTRAVENOUS | Status: AC
  Administered 2017-10-12: 3.375 g via INTRAVENOUS
  Filled 2017-10-12: qty 50

## 2017-10-12 MED ORDER — METHYLPREDNISOLONE SODIUM SUCC 125 MG IJ SOLR
INTRAMUSCULAR | Status: AC
  Administered 2017-10-12: 12:00:00
  Filled 2017-10-12: qty 2

## 2017-10-12 MED ORDER — VALACYCLOVIR HCL 500 MG PO TABS
1000.0000 mg | ORAL_TABLET | Freq: Every day | ORAL | Status: DC
  Administered 2017-10-13 – 2017-10-27 (×15): 1000 mg via ORAL
  Filled 2017-10-12 (×19): qty 2

## 2017-10-12 MED ORDER — SODIUM CHLORIDE 0.9 % IV BOLUS (SEPSIS)
1000.0000 mL | Freq: Once | INTRAVENOUS | Status: AC
  Administered 2017-10-12: 1000 mL via INTRAVENOUS

## 2017-10-12 MED ORDER — ZOLPIDEM TARTRATE 5 MG PO TABS
5.0000 mg | ORAL_TABLET | Freq: Every day | ORAL | Status: DC
  Administered 2017-10-12 – 2017-10-26 (×15): 5 mg via ORAL
  Filled 2017-10-12 (×15): qty 1

## 2017-10-12 MED ORDER — DEXTROSE 5 % IV SOLN: 500.0000 mg | Freq: Once | INTRAVENOUS | Status: DC

## 2017-10-12 MED ORDER — DEXTROSE 5 % IV SOLN
1.0000 g | Freq: Three times a day (TID) | INTRAVENOUS | Status: DC
  Administered 2017-10-13 (×3): 1 g via INTRAVENOUS
  Filled 2017-10-12 (×5): qty 1

## 2017-10-12 MED ORDER — ATAZANAVIR SULFATE 200 MG PO CAPS
400.0000 mg | ORAL_CAPSULE | Freq: Every day | ORAL | Status: DC
  Administered 2017-10-13 – 2017-10-27 (×14): 400 mg via ORAL
  Filled 2017-10-12 (×21): qty 2

## 2017-10-12 MED ORDER — GUAIFENESIN ER 600 MG PO TB12
1200.0000 mg | ORAL_TABLET | Freq: Two times a day (BID) | ORAL | Status: DC
  Administered 2017-10-12 – 2017-10-27 (×30): 1200 mg via ORAL
  Filled 2017-10-12 (×30): qty 2

## 2017-10-12 MED ORDER — METHYLPREDNISOLONE SODIUM SUCC 125 MG IJ SOLR
60.0000 mg | Freq: Once | INTRAMUSCULAR | Status: AC
  Administered 2017-10-12: 60 mg via INTRAMUSCULAR
  Filled 2017-10-12: qty 2

## 2017-10-12 MED ORDER — IPRATROPIUM-ALBUTEROL 0.5-2.5 (3) MG/3ML IN SOLN
3.0000 mL | Freq: Once | RESPIRATORY_TRACT | Status: AC
  Administered 2017-10-12: 3 mL via RESPIRATORY_TRACT
  Filled 2017-10-12: qty 3

## 2017-10-12 MED ORDER — PIPERACILLIN-TAZOBACTAM IN DEX 2-0.25 GM/50ML IV SOLN: 2.2500 g | Freq: Once | INTRAVENOUS | Status: DC

## 2017-10-12 MED ORDER — CEFEPIME HCL 1 G IJ SOLR
INTRAMUSCULAR | Status: AC
  Filled 2017-10-12: qty 1

## 2017-10-12 MED ORDER — ACETAMINOPHEN 325 MG PO TABS
650.0000 mg | ORAL_TABLET | Freq: Four times a day (QID) | ORAL | Status: DC | PRN
  Administered 2017-10-14 – 2017-10-25 (×3): 650 mg via ORAL
  Filled 2017-10-12 (×3): qty 2

## 2017-10-12 MED ORDER — DIVALPROEX SODIUM 250 MG PO DR TAB
500.0000 mg | DELAYED_RELEASE_TABLET | ORAL | Status: DC
  Administered 2017-10-12 – 2017-10-27 (×30): 500 mg via ORAL
  Filled 2017-10-12 (×29): qty 2

## 2017-10-12 MED ORDER — VANCOMYCIN HCL IN DEXTROSE 1-5 GM/200ML-% IV SOLN
1000.0000 mg | Freq: Once | INTRAVENOUS | Status: AC
  Administered 2017-10-12: 1000 mg via INTRAVENOUS
  Filled 2017-10-12: qty 200

## 2017-10-12 MED ORDER — DEXTROSE 5 % IV SOLN: 1.0000 g | Freq: Once | INTRAVENOUS | Status: DC

## 2017-10-12 MED ORDER — VANCOMYCIN HCL IN DEXTROSE 750-5 MG/150ML-% IV SOLN
750.0000 mg | Freq: Two times a day (BID) | INTRAVENOUS | Status: DC
  Filled 2017-10-12 (×2): qty 150

## 2017-10-12 MED ORDER — DEXTROSE 5 % IV SOLN
2.0000 g | Freq: Once | INTRAVENOUS | Status: AC
  Administered 2017-10-12: 2 g via INTRAVENOUS
  Filled 2017-10-12 (×2): qty 2

## 2017-10-12 MED ORDER — ATORVASTATIN CALCIUM 10 MG PO TABS
10.0000 mg | ORAL_TABLET | Freq: Two times a day (BID) | ORAL | Status: DC
  Administered 2017-10-12 – 2017-10-27 (×30): 10 mg via ORAL
  Filled 2017-10-12 (×30): qty 1

## 2017-10-12 MED ORDER — ACETAMINOPHEN 650 MG RE SUPP: 650.0000 mg | Freq: Four times a day (QID) | RECTAL | Status: DC | PRN

## 2017-10-12 MED ORDER — QUETIAPINE FUMARATE 100 MG PO TABS
100.0000 mg | ORAL_TABLET | ORAL | Status: DC
  Administered 2017-10-12 – 2017-10-26 (×30): 100 mg via ORAL
  Filled 2017-10-12 (×34): qty 1

## 2017-10-12 MED ORDER — ABACAVIR SULFATE-LAMIVUDINE 600-300 MG PO TABS
1.0000 | ORAL_TABLET | Freq: Every day | ORAL | Status: DC
  Filled 2017-10-12: qty 1

## 2017-10-12 MED ORDER — RALTEGRAVIR POTASSIUM 400 MG PO TABS
400.0000 mg | ORAL_TABLET | Freq: Two times a day (BID) | ORAL | Status: DC
  Administered 2017-10-12 – 2017-10-27 (×30): 400 mg via ORAL
  Filled 2017-10-12 (×34): qty 1

## 2017-10-12 MED ORDER — MAGNESIUM OXIDE 400 MG PO CAPS: 400.0000 mg | ORAL_CAPSULE | Freq: Every morning | ORAL | Status: DC

## 2017-10-12 MED ORDER — SODIUM CHLORIDE 0.9 % IV SOLN
INTRAVENOUS | Status: DC
  Administered 2017-10-12 – 2017-10-14 (×5): via INTRAVENOUS

## 2017-10-12 MED ORDER — ACETAMINOPHEN 500 MG PO TABS
1000.0000 mg | ORAL_TABLET | Freq: Once | ORAL | Status: AC
  Administered 2017-10-12: 1000 mg via ORAL
  Filled 2017-10-12: qty 2

## 2017-10-12 MED ORDER — IPRATROPIUM BROMIDE 0.02 % IN SOLN
0.5000 mg | Freq: Once | RESPIRATORY_TRACT | Status: AC
  Administered 2017-10-12: 0.5 mg via RESPIRATORY_TRACT
  Filled 2017-10-12: qty 2.5

## 2017-10-12 MED ORDER — HALOPERIDOL 5 MG PO TABS
20.0000 mg | ORAL_TABLET | Freq: Three times a day (TID) | ORAL | Status: DC
Start: 2017-10-12 — End: 2017-10-27
  Administered 2017-10-12 – 2017-10-27 (×45): 20 mg via ORAL
  Filled 2017-10-12 (×28): qty 4
  Filled 2017-10-12: qty 10
  Filled 2017-10-12 (×11): qty 4
  Filled 2017-10-12: qty 10
  Filled 2017-10-12 (×14): qty 4

## 2017-10-12 MED ORDER — ENOXAPARIN SODIUM 40 MG/0.4ML ~~LOC~~ SOLN
40.0000 mg | SUBCUTANEOUS | Status: DC
  Administered 2017-10-12 – 2017-10-26 (×15): 40 mg via SUBCUTANEOUS
  Filled 2017-10-12 (×16): qty 0.4

## 2017-10-12 MED ORDER — PIPERACILLIN-TAZOBACTAM 3.375 G IVPB: 3.3750 g | Freq: Three times a day (TID) | INTRAVENOUS | Status: DC

## 2017-10-12 MED ORDER — MAGNESIUM OXIDE 400 (241.3 MG) MG PO TABS
400.0000 mg | ORAL_TABLET | Freq: Every day | ORAL | Status: DC
  Administered 2017-10-13 – 2017-10-27 (×15): 400 mg via ORAL
  Filled 2017-10-12 (×15): qty 1

## 2017-10-12 MED ORDER — TRIHEXYPHENIDYL HCL 2 MG PO TABS
2.0000 mg | ORAL_TABLET | Freq: Two times a day (BID) | ORAL | Status: DC
  Administered 2017-10-12 – 2017-10-27 (×30): 2 mg via ORAL
  Filled 2017-10-12 (×36): qty 1

## 2017-10-12 MED ORDER — IPRATROPIUM-ALBUTEROL 0.5-2.5 (3) MG/3ML IN SOLN
3.0000 mL | Freq: Four times a day (QID) | RESPIRATORY_TRACT | Status: DC
  Administered 2017-10-12 – 2017-10-21 (×32): 3 mL via RESPIRATORY_TRACT
  Filled 2017-10-12 (×33): qty 3

## 2017-10-12 MED ORDER — SULFAMETHOXAZOLE-TRIMETHOPRIM 800-160 MG PO TABS
1.0000 | ORAL_TABLET | Freq: Once | ORAL | Status: AC
  Administered 2017-10-12: 1 via ORAL
  Filled 2017-10-12: qty 1

## 2017-10-12 MED ORDER — SODIUM CHLORIDE 0.9 % IV BOLUS (SEPSIS)
1000.0000 mL | Freq: Once | INTRAVENOUS | Status: AC
Start: 2017-10-12 — End: 2017-10-12
  Administered 2017-10-12: 1000 mL via INTRAVENOUS

## 2017-10-12 MED ORDER — HYDROXYZINE PAMOATE 25 MG PO CAPS
25.0000 mg | ORAL_CAPSULE | Freq: Every day | ORAL | Status: DC
  Administered 2017-10-12: 25 mg via ORAL
  Filled 2017-10-12: qty 1

## 2017-10-12 MED ORDER — ONDANSETRON HCL 4 MG PO TABS: 4.0000 mg | ORAL_TABLET | Freq: Four times a day (QID) | ORAL | Status: DC | PRN

## 2017-10-12 MED ORDER — POLYETHYLENE GLYCOL 3350 17 G PO PACK: 17.0000 g | PACK | Freq: Every day | ORAL | Status: DC | PRN

## 2017-10-12 MED ORDER — ONDANSETRON HCL 4 MG/2ML IJ SOLN
4.0000 mg | Freq: Four times a day (QID) | INTRAMUSCULAR | Status: DC | PRN
  Administered 2017-10-14: 4 mg via INTRAVENOUS
  Filled 2017-10-12: qty 2

## 2017-10-12 NOTE — ED Notes (Signed)
Respiratory in room.

## 2017-10-12 NOTE — ED Triage Notes (Signed)
Pt is a resident of [facility].  Reports cough, congestion, and chest pain x 2 or 3 days.

## 2017-10-12 NOTE — ED Notes (Signed)
Pt.'s O2 sats dropped to 87 while patient was sleeping. Bumped up pt.'s O2 to 5L and O2 sats now up to 94%. Pt in no distress while trying to breathe. Expiratory and inspiratory wheezing

## 2017-10-12 NOTE — H&P (Signed)
History and Physical    [name] BPZ:[id number] DOB: 1961-01-03 DOA: 10/12/2017  PCP: Rosita Fire, MD  Patient coming from: Group home  I have personally briefly reviewed patient's old medical records in [place]  Chief Complaint: Shortness of breath  HPI: Karina Clarke is a 56 y.o. female with medical history significant of HIV/AIDS, schizophrenia, presents to the emergency room with complaints of shortness of breath.  She complains of having shortness of breath for the past 2 days.  She says associated cough and wheezing.  Cough is productive of green colored sputum.  She is also been having some fevers.  Her symptoms have gotten progressively worse.  No vomiting, diarrhea, sore throat.  She does have some substernal chest pain which is worse with coughing.  On arrival to the emergency room, she was noted to be in respiratory distress.  Oxygen was applied and she received bronchodilators.  Her respiratory status has since improved.  Chest x-ray indicated pneumonia.  She is been referred for admission.  Review of Systems: As per HPI otherwise 10 point review of systems negative.    Past Medical History:  Diagnosis Date  . 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 Bloating    per sister  . Chronic right hip pain    sister states circulation problem in hip  . Family history of adverse reaction to anesthesia    sister has hx. of post-op N/V  . High cholesterol   . HIV (human immunodeficiency virus infection) ([place])   . Hypertension   . Limp   . Mild intellectual disability   . No natural teeth    does not wear her dentures  . Paranoid schizophrenia ([place])   . Personality disorder ([place])   . Schizophrenia [facility])     Past Surgical History:  Procedure Laterality Date  . BREAST LUMPECTOMY WITH NEEDLE LOCALIZATION Left 07/28/2015   Performed by Jovita Kussmaul, MD at [facility]  . COLONOSCOPY WITH PROPOFOL; in cecum at 1009; withdrawal time 10  minutes N/A 06/13/2015   Performed by Danie Binder, MD at [facility]  . HEMORRHOID SURGERY    . UMBILICAL HERNIA REPAIR       reports that she has been smoking cigarettes.  She has a 18.00 pack-year smoking history. she has never used smokeless tobacco. She reports that she does not drink alcohol or use drugs.  Allergies  Allergen Reactions  . Asa [Aspirin] Nausea And Vomiting    Family History  Problem Relation Age of Onset  . Anesthesia problems Sister        post-op N/V    Prior to Admission medications   Medication Sig Start Date End Date Taking? Authorizing Provider  abacavir-lamiVUDine (EPZICOM) 600-300 MG per tablet Take 1 tablet by mouth daily.   Yes [provider]  albuterol (PROVENTIL HFA;VENTOLIN HFA) 108 (90 Base) MCG/ACT inhaler Inhale 1-2 puffs into the lungs 2 (two) times daily as needed for wheezing or shortness of breath.   Yes [provider]  atazanavir (REYATAZ) 200 MG capsule Take 400 mg by mouth daily.    Yes [provider]  atorvastatin (LIPITOR) 10 MG tablet Take 10 mg by mouth 2 (two) times daily.   Yes [provider]  divalproex (DEPAKOTE) 500 MG DR tablet Take 500 mg by mouth See admin instructions. Take 1 tablet (500 mg) by mouth at 4pm and 8pm daily   Yes [provider]  haloperidol (HALDOL) 10 MG  tablet Take 20 mg by mouth 3 (three) times daily.    Yes [provider]  hydrOXYzine (VISTARIL) 25 MG capsule Take 25 mg by mouth at bedtime.    Yes [provider]  Magnesium Oxide 400 MG CAPS Take 1 capsule (400 mg total) by mouth every morning. 08/10/17  Yes Reyne Dumas, MD  polyethylene glycol (MIRALAX / GLYCOLAX) packet Take 17 g by mouth daily as needed.   Yes [provider]  QUEtiapine (SEROQUEL) 200 MG tablet Take 100 mg by mouth See admin instructions. Take 1 tablet (200 mg) by mouth with supper and at bedtime daily   Yes [provider]  raltegravir (ISENTRESS) 400 MG  tablet Take 400 mg by mouth 2 (two) times daily.   Yes [provider]  valACYclovir (VALTREX) 1000 MG tablet Take 1,000 mg by mouth daily.    Yes [provider]  zolpidem (AMBIEN) 10 MG tablet Take 5 mg by mouth at bedtime.    Yes [provider]  haloperidol decanoate (HALDOL DECANOATE) 100 MG/ML injection Inject 200 mg into the muscle every 28 (twenty-eight) days. Last injection 06/09/15     [provider]  trihexyphenidyl (ARTANE) 2 MG tablet Take 2 mg by mouth 2 (two) times daily with a meal.    [provider]    Physical Exam: Vitals:   10/12/17 1400 10/12/17 1430 10/12/17 1506 10/12/17 1600  BP: 125/78 103/77  117/74  Pulse: 86 84  77  Resp: (!) 30 20  (!) 21  Temp:      TempSrc:      SpO2: (!) 88% 93%  90%  Weight:   108.3 kg (238 lb 12.1 oz)   Height:   5\' 11"  (1.803 m)     Constitutional: NAD, calm, comfortable Vitals:   10/12/17 1400 10/12/17 1430 10/12/17 1506 10/12/17 1600  BP: 125/78 103/77  117/74  Pulse: 86 84  77  Resp: (!) 30 20  (!) 21  Temp:      TempSrc:      SpO2: (!) 88% 93%  90%  Weight:   108.3 kg (238 lb 12.1 oz)   Height:   5\' 11"  (1.803 m)    Eyes: PERRL, lids and conjunctivae normal ENMT: Mucous membranes are moist. Posterior pharynx clear of any exudate or lesions.Normal dentition.  Neck: normal, supple, no masses, no thyromegaly Respiratory: Rhonchi and wheezes bilaterally. Normal respiratory effort. No accessory muscle use.  Cardiovascular: Regular rate and rhythm, no murmurs / rubs / gallops. No extremity edema. 2+ pedal pulses. No carotid bruits.  Abdomen: no tenderness, no masses palpated. No hepatosplenomegaly. Bowel sounds positive.  Musculoskeletal: no clubbing / cyanosis. No joint deformity upper and lower extremities. Good ROM, no contractures. Normal muscle tone.  Skin: no rashes, lesions, ulcers. No induration Neurologic: CN 2-12 grossly intact. Sensation intact, DTR normal. Strength 5/5  in all 4.  Psychiatric: Normal judgment and insight. Alert and oriented x 3. Normal mood.   Labs on Admission: I have personally reviewed following labs and imaging studies  CBC: Recent Labs  Lab 10/12/17 0958  WBC 6.4  NEUTROABS 2.5  HGB 13.9  HCT 43.6  MCV 96.9  PLT 387   Basic Metabolic Panel: Recent Labs  Lab 10/12/17 0958  NA 140  K 3.6  CL 103  CO2 27  GLUCOSE 112*  BUN 18  CREATININE 1.25*  CALCIUM 9.5   GFR: Estimated Creatinine Clearance: 69.7 mL/min (A) (by C-G formula based on SCr of  1.25 mg/dL (H)). Liver Function Tests: Recent Labs  Lab 10/12/17 0958  AST 27  ALT 22  ALKPHOS 108  BILITOT 0.3  PROT 8.1  ALBUMIN 4.2   No results for input(s): LIPASE, AMYLASE in the last 168 hours. No results for input(s): AMMONIA in the last 168 hours. Coagulation Profile: No results for input(s): INR, PROTIME in the last 168 hours. Cardiac Enzymes: No results for input(s): CKTOTAL, CKMB, CKMBINDEX, TROPONINI in the last 168 hours. BNP (last 3 results) No results for input(s): PROBNP in the last 8760 hours. HbA1C: No results for input(s): HGBA1C in the last 72 hours. CBG: No results for input(s): GLUCAP in the last 168 hours. Lipid Profile: No results for input(s): CHOL, HDL, LDLCALC, TRIG, CHOLHDL, LDLDIRECT in the last 72 hours. Thyroid Function Tests: No results for input(s): TSH, T4TOTAL, FREET4, T3FREE, THYROIDAB in the last 72 hours. Anemia Panel: No results for input(s): VITAMINB12, FOLATE, FERRITIN, TIBC, IRON, RETICCTPCT in the last 72 hours. Urine analysis:    Component Value Date/Time   COLORURINE YELLOW 10/12/2017 [place] 10/12/2017 0944   LABSPEC 1.017 10/12/2017 [place] 5.0 10/12/2017 [place] 10/12/2017 [place] 10/12/2017 [place] 10/12/2017 [place] 10/12/2017 0944   PROTEINUR NEGATIVE 10/12/2017 0944   NITRITE NEGATIVE 10/12/2017 0944    LEUKOCYTESUR NEGATIVE 10/12/2017 0944    Radiological Exams on Admission: Dg Chest 2 View  Result Date: 10/12/2017 CLINICAL DATA:  Code sepsis EXAM: CHEST - 2 VIEW COMPARISON:  08/08/2017 FINDINGS: Some increase in right middle lobe infiltrate or atelectasis. Left lung clear. Heart size and mediastinal contours are within normal limits. No effusion.  No pneumothorax. Visualized bones unremarkable. IMPRESSION: 1. Increasing right middle lobe infiltrate/atelectasis. Electronically Signed   By: Lucrezia Europe M.D.   On: 10/12/2017 10:40    EKG: Independently reviewed.  Sinus tachycardia no acute changes  Assessment/Plan Active Problems:   Sepsis ([place])   Tobacco use disorder   HIV (human immunodeficiency virus infection) ([place])   Schizophrenia ([place])   HCAP (healthcare-associated pneumonia)   Acute respiratory failure with hypoxia ([place])   AIDS ([place])    1. Acute respiratory failure with hypoxia.  Related to pneumonia.  Will wean off oxygen as tolerated 2. Sepsis.  Related to pneumonia.  Patient was tachycardic and febrile on admission.  She had elevated lactic acid.  Blood cultures have been sent.  She received IV fluids per sepsis protocol.  Check pro-calcitonin.  Lactic acid is start to trend down with IV fluids. 3. Healthcare associated pneumonia.  Recent admission in 07/2017.  Per order set, will start her on vancomycin and cefepime.  Check MRSA PCR.  If she does not improve, with her immunocompromised state, we will need to keep opportunistic infections in mind.  Since she is wheezing, we will continue her on bronchodilators.  Continue current treatments. 4. HIV/AIDS.  Last CD4 count check was in 07/2017 and was found to be 150.  The patient does report compliance with her medications.  She [facility] with infectious disease [facility] at [facility].  Continue current treatments for now. 5. Schizophrenia.  Continue on psychotropic medications. 6. Tobacco use.  Counseled on the importance of  tobacco cessation.  DVT prophylaxis: Lovenox Code Status: Full code Family Communication: No family present Disposition Plan: Discharge home once improved Consults called: Admission status: inpatient, telemetry   Kathie Dike MD [facility] Pager [phone]  If 7PM-7AM,  please contact night-coverage [email or web address] Password [facility]  10/12/2017, 4:37 PM

## 2017-10-12 NOTE — ED Provider Notes (Signed)
[facility] EMERGENCY DEPARTMENT Provider Note   CSN: [id number] Arrival date & time: 10/12/17  4627     History   Chief Complaint Chief Complaint  Patient presents with  . Cough    HPI Karina Clarke is a 56 y.o. female with PMH/o HIV, Schizophrenia who presents with 2 days of cough, congestion and chest pain. Patient reports cough is productive of green sputum. She reports that her chest has been hurting constantly for the past 2 days. She reports it is a generalized sharp pain. Patient states that is is not only just when she coughs but also at rest. No other alleviating or aggravating factors.  She also notes that since last night she felt like she has been wheezing.  She states that she has not been using an inhaler for symptoms.  Patient was recently admitted on 9/14 for evaluation of pneumonia.  She was discharged on 08/10/17 with oral antibiotics which her legal guardian states that she has been taking consistently.  Patient does have a history of HIV.  Her legal guardian states that she was last seen by her ID doctor 2 weeks ago.  Patient denies any personal cardiac history.  She denies any family cardiac history.  No family members died at a young age of heart attacks.  Patient is not on oxygen requirements at home.  Patient legal guardian denies any fevers, abdominal pain, nausea/vomiting.  The history is provided by the patient.    Past Medical History:  Diagnosis Date  . 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 Bloating    per sister  . Chronic right hip pain    sister states circulation problem in hip  . Family history of adverse reaction to anesthesia    sister has hx. of post-op N/V  . High cholesterol   . HIV (human immunodeficiency virus infection) ([place])   . Hypertension   . Limp   . Mild intellectual disability   . No natural teeth    does not wear her dentures  . Paranoid schizophrenia ([place])   . Personality disorder ([place])   . Schizophrenia  [facility])     Patient Active Problem List   Diagnosis Date Noted  . HCAP (healthcare-associated pneumonia) 10/12/2017  . Influenza 12/26/2016  . Sepsis ([place]) 12/26/2016  . CAP (community acquired pneumonia) 12/26/2016  . Tobacco use disorder 12/26/2016  . HIV (human immunodeficiency virus infection) ([place]) 12/26/2016  . Schizophrenia ([place]) 12/26/2016  . Personality disorder ([place]) 08/22/2016  . Encounter for screening colonoscopy 05/24/2015    Past Surgical History:  Procedure Laterality Date  . BREAST LUMPECTOMY WITH NEEDLE LOCALIZATION Left 07/28/2015   Performed by Jovita Kussmaul, MD at [facility]  . COLONOSCOPY WITH PROPOFOL; in cecum at 1009; withdrawal time 10 minutes N/A 06/13/2015   Performed by Danie Binder, MD at [facility]  . HEMORRHOID SURGERY    . UMBILICAL HERNIA REPAIR      OB History    No data available       Home Medications    Prior to Admission medications   Medication Sig Start Date End Date Taking? Authorizing Provider  abacavir-lamiVUDine (EPZICOM) 600-300 MG per tablet Take 1 tablet by mouth daily.   Yes [provider]  albuterol (PROVENTIL HFA;VENTOLIN HFA) 108 (90 Base) MCG/ACT inhaler Inhale 1-2 puffs into the lungs 2 (two) times daily as needed for wheezing or shortness of breath.   Yes [provider]  atazanavir (REYATAZ) 200 MG  capsule Take 400 mg by mouth daily.    Yes [provider]  atorvastatin (LIPITOR) 10 MG tablet Take 10 mg by mouth 2 (two) times daily.   Yes [provider]  divalproex (DEPAKOTE) 500 MG DR tablet Take 500 mg by mouth See admin instructions. Take 1 tablet (500 mg) by mouth at 4pm and 8pm daily   Yes [provider]  haloperidol (HALDOL) 10 MG tablet Take 20 mg by mouth 3 (three) times daily.    Yes [provider]  hydrOXYzine (VISTARIL) 25 MG capsule Take 25 mg by mouth at bedtime.    Yes [provider]  Magnesium Oxide 400 MG CAPS Take 1 capsule  (400 mg total) by mouth every morning. 08/10/17  Yes Reyne Dumas, MD  polyethylene glycol (MIRALAX / GLYCOLAX) packet Take 17 g by mouth daily as needed.   Yes [provider]  QUEtiapine (SEROQUEL) 200 MG tablet Take 100 mg by mouth See admin instructions. Take 1 tablet (200 mg) by mouth with supper and at bedtime daily   Yes [provider]  raltegravir (ISENTRESS) 400 MG tablet Take 400 mg by mouth 2 (two) times daily.   Yes [provider]  valACYclovir (VALTREX) 1000 MG tablet Take 1,000 mg by mouth daily.    Yes [provider]  zolpidem (AMBIEN) 10 MG tablet Take 5 mg by mouth at bedtime.    Yes [provider]  haloperidol decanoate (HALDOL DECANOATE) 100 MG/ML injection Inject 200 mg into the muscle every 28 (twenty-eight) days. Last injection 06/09/15     [provider]  trihexyphenidyl (ARTANE) 2 MG tablet Take 2 mg by mouth 2 (two) times daily with a meal.    [provider]    Family History Family History  Problem Relation Age of Onset  . Anesthesia problems Sister        post-op N/V    Social History Social History   Tobacco Use  . Smoking status: Current Every Day Smoker    Packs/day: 1.00    Years: 18.00    Pack years: 18.00    Types: Cigarettes  . Smokeless tobacco: Never Used  Substance Use Topics  . Alcohol use: No    Alcohol/week: 0.0 oz  . Drug use: No     Allergies   Asa [aspirin]   Review of Systems Review of Systems  Constitutional: Negative for chills and fever.  HENT: Positive for congestion.   Respiratory: Positive for cough and wheezing. Negative for shortness of breath.   Cardiovascular: Positive for chest pain.  Gastrointestinal: Negative for abdominal pain, diarrhea, nausea and vomiting.  Genitourinary: Negative for dysuria and hematuria.  Neurological: Negative for dizziness, weakness, numbness and headaches.     Physical Exam Updated Vital Signs BP 130/77   Pulse 94    Temp (!) 101.1 F (38.4 C) (Rectal)   Resp (!) 23   Wt 99.8 kg (220 lb)   SpO2 90%   BMI 30.68 kg/m   Physical Exam  Constitutional: She appears well-developed and well-nourished. She has a sickly appearance.  HENT:  Head: Normocephalic and atraumatic.  Mouth/Throat: Oropharynx is clear and moist. Mucous membranes are dry.  Eyes: Conjunctivae, EOM and lids are normal. Pupils are equal, round, and reactive to light.  Neck: Full passive range of motion without pain.  Cardiovascular: Regular rhythm and normal heart sounds. Tachycardia present. Exam reveals no gallop and no friction rub.  No murmur heard. Pulses:  Radial pulses are 2+ on the right side, and 2+ on the left side.  Pulmonary/Chest: Effort normal. Tachypnea noted. No respiratory distress. She has wheezes. She has rhonchi. She has rales in the right upper field and the right middle field.  Diffuse expiratory wheezing throughout all lung fields.  Able speak in full sentences without any difficulty.  He is tenderness palpation to the anterior chest wall.  No deformity or crepitus noted.  Abdominal: Soft. Normal appearance. There is no tenderness. There is no rigidity and no guarding.  No peritoneal signs.  Musculoskeletal: Normal range of motion.  Neurological: She is alert.  A&O x 2. Patient responds with "I don't know" when asked what year it is.  Skin: Skin is warm and dry. Capillary refill takes less than 2 seconds.  Psychiatric: She has a normal mood and affect. Her speech is normal.  Nursing note and vitals reviewed.    ED Treatments / Results  Labs (all labs ordered are listed, but only abnormal results are displayed) Labs Reviewed  CBC WITH DIFFERENTIAL/PLATELET - Abnormal; Notable for the following components:      Result Value   RDW 16.0 (*)    Monocytes Absolute 1.2 (*)    All other components within normal limits  COMPREHENSIVE METABOLIC PANEL - Abnormal; Notable for the following components:    Glucose, Bld 112 (*)    Creatinine, Ser 1.25 (*)    GFR calc non Af Amer 48 (*)    GFR calc Af Amer 55 (*)    All other components within normal limits  I-STAT CG4 LACTIC ACID, ED - Abnormal; Notable for the following components:   Lactic Acid, Venous 1.97 (*)    All other components within normal limits  CULTURE, BLOOD (ROUTINE X 2)  CULTURE, BLOOD (ROUTINE X 2)  URINALYSIS, ROUTINE W REFLEX MICROSCOPIC  I-STAT TROPONIN, ED  I-STAT CG4 LACTIC ACID, ED  I-STAT TROPONIN, ED    EKG  EKG Interpretation  Date/Time:  Sunday October 12 2017 09:44:58 EST Ventricular Rate:  124 PR Interval:    QRS Duration: 93 QT Interval:  321 QTC Calculation: 461 R Axis:   -96 Text Interpretation:  Sinus tachycardia LAE, consider biatrial enlargement Left anterior fascicular block Right ventricular hypertrophy ST elevation, consider inferior injury No significant change since last tracing Confirmed by Isla Pence [phone]) on 10/12/2017 9:47:33 AM       Radiology Dg Chest 2 View  Result Date: 10/12/2017 CLINICAL DATA:  Code sepsis EXAM: CHEST - 2 VIEW COMPARISON:  08/08/2017 FINDINGS: Some increase in right middle lobe infiltrate or atelectasis. Left lung clear. Heart size and mediastinal contours are within normal limits. No effusion.  No pneumothorax. Visualized bones unremarkable. IMPRESSION: 1. Increasing right middle lobe infiltrate/atelectasis. Electronically Signed   By: Lucrezia Europe M.D.   On: 10/12/2017 10:40    Procedures Procedures (including critical care time)  Medications Ordered in ED Medications  methylPREDNISolone sodium succinate (SOLU-MEDROL) 125 mg/2 mL injection (not administered)  vancomycin (VANCOCIN) IVPB 750 mg/150 ml premix (not administered)  piperacillin-tazobactam (ZOSYN) IVPB 3.375 g (not administered)  sodium chloride 0.9 % bolus 1,000 mL (0 mLs Intravenous Stopped 10/12/17 1243)  ipratropium-albuterol (DUONEB) 0.5-2.5 (3) MG/3ML nebulizer solution 3 mL (3 mLs  Nebulization Given 10/12/17 1056)  vancomycin (VANCOCIN) IVPB 1000 mg/200 mL premix (0 mg Intravenous Stopped 10/12/17 1139)  sulfamethoxazole-trimethoprim (BACTRIM DS,SEPTRA DS) 800-160 MG per tablet 1 tablet (1 tablet Oral Given 10/12/17 1020)  acetaminophen (TYLENOL) tablet 1,000 mg (1,000 mg Oral  Given 10/12/17 1020)  sodium chloride 0.9 % bolus 1,000 mL (0 mLs Intravenous Stopped 10/12/17 1135)    And  sodium chloride 0.9 % bolus 1,000 mL (0 mLs Intravenous Stopped 10/12/17 1243)    And  sodium chloride 0.9 % bolus 1,000 mL (1,000 mLs Intravenous New Bag/Given 10/12/17 1252)  piperacillin-tazobactam (ZOSYN) IVPB 3.375 g (0 g Intravenous Stopped 10/12/17 1113)  methylPREDNISolone sodium succinate (SOLU-MEDROL) 125 mg/2 mL injection 60 mg (60 mg Intramuscular Given 10/12/17 1141)  ipratropium (ATROVENT) nebulizer solution 0.5 mg (0.5 mg Nebulization Given 10/12/17 1237)  vancomycin (VANCOCIN) IVPB 1000 mg/200 mL premix (1,000 mg Intravenous New Bag/Given 10/12/17 1253)     Initial Impression / Assessment and Plan / ED Course  I have reviewed the triage vital signs and the nursing notes.  Pertinent labs & imaging results that were available during my care of the patient were reviewed by me and considered in my medical decision making (see chart for details).     56 year old female with past medical history of HIV, schizophrenia who presents with 2 days of cough, congestion, chest pain.  No associated fevers.  Cough is productive of green sputum.  Chest pain is at rest.  No other alleviating or aggravating factors.  Patient also feel like she is wheezing.  On ED arrival, patient is tachypneic, tachycardic, febrile and hypoxic.  She has diffuse wheezing noted throughout all lung fields.  We will plan to give a nebulizer in the department.  Patient has no O2 requirements at home.  Throughout interview, she is O2 sats are ranging from 88-90% on room air.  Patient placed on 2 L of nasal cannula  oxygen.  Code sepsis initiated based on initial evaluation.  Records reviewed to the patient has been on IV Zosyn and vancomycin for previous sepsis.  We will also add Bactrim for concerns of PCP pneumonia given HIV history. IVF given for fluid resuscitation  Records reviewed.  Patient last CD4 count was on 08/08/17 and was 150 at that time.  Patient reports that she is followed by ID and states that she last saw him approximately 2 weeks ago.  Patient was admitted on 08/08/17 for sepsis and pneumonia.  At that time, she had IV antibiotics done.  She was discharged on 08/10/17 with oral antibiotics.  Patient states that she completed antibiotics.  Reevaluation.  Patient still slightly hypoxic after 2 L of O2.  Patient's O2 sat was bumped up to 4 L which caused improvement in O2 sats up to 94-95%.   Labs and imaging reviewed.  Lactic acid is 1.97.  Troponins negative.  CBC unremarkable.  CMP unremarkable.  Is pending.  Chest x-ray is positive for right infiltrate.  Given patient's immune compromised state, clinical presentation and PSI score, will likely need admission for further continued IV antibiotics and monitoring.  Discussed patient with Dr. Wynema Birch (hospitalist).  Will accept patient for admission.   Final Clinical Impressions(s) / ED Diagnoses   Final diagnoses:  Community acquired pneumonia of right lung, unspecified part of lung  Sepsis, due to unspecified organism [facility])    ED Discharge Orders    None       Volanda Napoleon, PA-C 10/12/17 1356    Isla Pence, MD 10/12/17 1407

## 2017-10-12 NOTE — Progress Notes (Addendum)
Pharmacy Antibiotic Note  Khristin Keleher is a 56 y.o. female admitted on 10/12/2017 with pneumonia.  Pharmacy has been consulted for Zosyn and Vancomycin dosing.  Plan: Vancomycin 1000mg  in ED, will give another 1000mg  for total loading dose of 2gm, then 750mg  IV every 12 hours.  Goal trough 15-20 mcg/mL. Zosyn 3.375g IV q8h (4 hour infusion). F/U cxs and clinical progress Monitor V/S, labs, and levels as indicated  Weight: 220 lb (99.8 kg)  Temp (24hrs), Avg:100.4 F (38 C), Min:99.6 F (37.6 C), Max:101.1 F (38.4 C)  Recent Labs  Lab 10/12/17 0958 10/12/17 1011  WBC 6.4  --   CREATININE 1.25*  --   LATICACIDVEN  --  1.97*    Normalized CrCl 37mls/min Estimated Creatinine Clearance: 66.9 mL/min (A) (by C-G formula based on SCr of 1.25 mg/dL (H)).    Allergies  Allergen Reactions  . Asa [Aspirin] Nausea And Vomiting    Antimicrobials this admission: Zosyn 11/18 >> 11/18 Vancomycin 11/18 >>  Cefepime 11/18>>  Dose adjustments this admission: N/A  Microbiology results: 11/18 BCx: pending  Thank you for allowing pharmacy to be a part of this patient's care.  Clinical Pharmacist Pager [phone] 10/12/2017 12:15 PM   11/18 at 1730.  D/c zosyn Cefepime 1gm IV q12h  Isac Sarna, BS Pharm D, BCPS

## 2017-10-12 NOTE — ED Notes (Signed)
Have paged respiratory  

## 2017-10-12 NOTE — ED Notes (Signed)
Had to pull an extra solumedrol due to the first being clogged in glass container

## 2017-10-13 LAB — COMPREHENSIVE METABOLIC PANEL
ALBUMIN: 3.1 g/dL — AB (ref 3.5–5.0)
ALK PHOS: 72 U/L (ref 38–126)
ALT: 22 U/L (ref 14–54)
AST: 22 U/L (ref 15–41)
Anion gap: 5 (ref 5–15)
BILIRUBIN TOTAL: 0.3 mg/dL (ref 0.3–1.2)
BUN: 13 mg/dL (ref 6–20)
CALCIUM: 8.1 mg/dL — AB (ref 8.9–10.3)
CO2: 26 mmol/L (ref 22–32)
CREATININE: 0.87 mg/dL (ref 0.44–1.00)
Chloride: 108 mmol/L (ref 101–111)
GFR calc Af Amer: >60 mL/min (ref >60)
GLUCOSE: 151 mg/dL — AB (ref 65–99)
POTASSIUM: 4.3 mmol/L (ref 3.5–5.1)
Sodium: 139 mmol/L (ref 135–145)
TOTAL PROTEIN: 6.4 g/dL — AB (ref 6.5–8.1)

## 2017-10-13 LAB — CBC
HCT: 37.1 % (ref 36.0–46.0)
Hemoglobin: 11.6 g/dL — ABNORMAL LOW (ref 12.0–15.0)
MCH: 30.5 pg (ref 26.0–34.0)
MCHC: 31.3 g/dL (ref 30.0–36.0)
MCV: 97.6 fL (ref 78.0–100.0)
Platelets: 129 10*3/uL — ABNORMAL LOW (ref 150–400)
RBC: 3.8 MIL/uL — ABNORMAL LOW (ref 3.87–5.11)
RDW: 15.5 % (ref 11.5–15.5)
WBC: 5.3 10*3/uL (ref 4.0–10.5)

## 2017-10-13 MED ORDER — ORAL CARE MOUTH RINSE
15.0000 mL | Freq: Two times a day (BID) | OROMUCOSAL | Status: DC
  Administered 2017-10-13 – 2017-10-27 (×24): 15 mL via OROMUCOSAL

## 2017-10-13 MED ORDER — HYDROXYZINE HCL 25 MG PO TABS
25.0000 mg | ORAL_TABLET | Freq: Every day | ORAL | Status: DC
  Administered 2017-10-13 – 2017-10-26 (×14): 25 mg via ORAL
  Filled 2017-10-13 (×14): qty 1

## 2017-10-13 MED ORDER — LAMIVUDINE 150 MG PO TABS
300.0000 mg | ORAL_TABLET | Freq: Every day | ORAL | Status: DC
  Administered 2017-10-13 – 2017-10-27 (×15): 300 mg via ORAL
  Filled 2017-10-13 (×17): qty 2

## 2017-10-13 MED ORDER — DIVALPROEX SODIUM ER 250 MG PO TB24
ORAL_TABLET | ORAL | Status: AC
  Filled 2017-10-13: qty 2

## 2017-10-13 MED ORDER — ABACAVIR SULFATE 300 MG PO TABS
600.0000 mg | ORAL_TABLET | Freq: Every day | ORAL | Status: DC
  Administered 2017-10-13 – 2017-10-27 (×15): 600 mg via ORAL
  Filled 2017-10-13 (×17): qty 2

## 2017-10-13 MED ORDER — AMOXICILLIN-POT CLAVULANATE 875-125 MG PO TABS
1.0000 | ORAL_TABLET | Freq: Two times a day (BID) | ORAL | Status: DC
  Administered 2017-10-13 – 2017-10-17 (×9): 1 via ORAL
  Filled 2017-10-13 (×9): qty 1

## 2017-10-13 NOTE — Progress Notes (Signed)
PROGRESS NOTE    Karina Clarke  YIR:[id number] DOB: 1961-09-03 DOA: 10/12/2017 PCP: Rosita Fire, MD     Brief Narrative:  56 year old woman admitted from home on 11/18 due to shortness of breath.  She has a history of HIV/AIDS, schizophrenia.  Chest x-ray indicated pneumonia and she was referred for admission.   Assessment & Plan:   Active Problems:   Sepsis ([place])   Tobacco use disorder   HIV (human immunodeficiency virus infection) ([place])   Schizophrenia ([place])   HCAP (healthcare-associated pneumonia)   Acute respiratory failure with hypoxia ([place])   AIDS ([place])   Acute hypoxemic respiratory failure -Related to pneumonia, continue to wean oxygen as tolerated.  Healthcare associated pneumonia/early sepsis -Sepsis parameters have improved -Not sure if a group home can be characterized as a healthcare setting, in any case she continues to improve. -Was initially placed on vancomycin and cefepime, MRSA PCR results are negative and vancomycin was discontinued. -Today I have also discontinued her cefepime and placed her on Augmentin which will continue antibiotics for a total of 7 days. -Culture data remains negative at this point, she remains nontoxic.  HIV/AIDS -Last CD4 count in September 2018 was 150. -She has an infectious disease specialist at [facility], continue current antiretrovirals.    Schizophrenia -Mood is stable. -Continue psychotropic medications.   DVT prophylaxis: Lovenox Code Status: Full code Family Communication: Patient Disposition Plan: Back to group home in 24-48 hours  Consultants:   None  Procedures:   None  Antimicrobials:  Anti-infectives (From admission, onward)   Start     Dose/Rate Route Frequency Ordered Stop   10/13/17 1000  abacavir-lamiVUDine (EPZICOM) 600-300 MG per tablet 1 tablet  Status:  Discontinued     1 tablet Oral Daily 10/12/17 1634 10/13/17 0824   10/13/17 1000  valACYclovir (VALTREX) tablet 1,000 mg     1,000 mg Oral Daily 10/12/17 1634     10/13/17 1000  abacavir (ZIAGEN) tablet 600 mg     600 mg Oral Daily 10/13/17 0824     10/13/17 1000  lamiVUDine (EPIVIR) tablet 300 mg     300 mg Oral Daily 10/13/17 0824     10/13/17 0800  atazanavir (REYATAZ) capsule 400 mg     400 mg Oral Daily with breakfast 10/12/17 1634     10/13/17 0100  ceFEPIme (MAXIPIME) 1 g in dextrose 5 % 50 mL IVPB     1 g 100 mL/hr over 30 Minutes Intravenous Every 8 hours 10/12/17 1732     10/12/17 2200  vancomycin (VANCOCIN) IVPB 750 mg/150 ml premix  Status:  Discontinued     750 mg 150 mL/hr over 60 Minutes Intravenous Every 12 hours 10/12/17 1258 10/12/17 1634   10/12/17 2200  raltegravir (ISENTRESS) tablet 400 mg     400 mg Oral 2 times daily 10/12/17 1634     10/12/17 1700  piperacillin-tazobactam (ZOSYN) IVPB 3.375 g  Status:  Discontinued     3.375 g 12.5 mL/hr over 240 Minutes Intravenous Every 8 hours 10/12/17 1258 10/12/17 1634   10/12/17 1700  ceFEPIme (MAXIPIME) 2 g in dextrose 5 % 50 mL IVPB     2 g 100 mL/hr over 30 Minutes Intravenous  Once 10/12/17 1634 10/12/17 1733   10/12/17 1645  vancomycin (VANCOCIN) IVPB 1000 mg/200 mL premix  Status:  Discontinued     1,000 mg 200 mL/hr over 60 Minutes Intravenous  Once 10/12/17 1634 10/12/17 1657   10/12/17 1230  vancomycin (VANCOCIN) IVPB 1000 mg/200  mL premix     1,000 mg 200 mL/hr over 60 Minutes Intravenous  Once 10/12/17 1229 10/12/17 1404   10/12/17 1030  piperacillin-tazobactam (ZOSYN) IVPB 3.375 g     3.375 g 100 mL/hr over 30 Minutes Intravenous  Once 10/12/17 1025 10/12/17 1113   10/12/17 1015  sulfamethoxazole-trimethoprim (BACTRIM DS,SEPTRA DS) 800-160 MG per tablet 1 tablet     1 tablet Oral  Once 10/12/17 1003 10/12/17 1020   10/12/17 1000  cefTRIAXone (ROCEPHIN) 1 g in dextrose 5 % 50 mL IVPB  Status:  Discontinued     1 g 100 mL/hr over 30 Minutes Intravenous  Once 10/12/17 0951 10/12/17 0952   10/12/17 1000  azithromycin (ZITHROMAX) 500  mg in dextrose 5 % 250 mL IVPB  Status:  Discontinued     500 mg 250 mL/hr over 60 Minutes Intravenous  Once 10/12/17 0951 10/12/17 0952   10/12/17 1000  vancomycin (VANCOCIN) IVPB 1000 mg/200 mL premix     1,000 mg 200 mL/hr over 60 Minutes Intravenous  Once 10/12/17 0954 10/12/17 1139   10/12/17 1000  piperacillin-tazobactam (ZOSYN) IVPB 2.25 g  Status:  Discontinued     2.25 g 100 mL/hr over 30 Minutes Intravenous  Once 10/12/17 0954 10/12/17 1025       Subjective: States she feels better, hardly any shortness of breath today.  Objective: Vitals:   10/13/17 0349 10/13/17 0744 10/13/17 1326 10/13/17 1449  BP: 125/60   116/77  Pulse: 88   87  Resp: 19   18  Temp: 99.2 F (37.3 C)   99.1 F (37.3 C)  TempSrc: Oral   Oral  SpO2: 97% 94% 96% 98%  Weight:      Height:        Intake/Output Summary (Last 24 hours) at 10/13/2017 1750 Last data filed at 10/13/2017 1739 Gross per 24 hour  Intake 3145 ml  Output 1200 ml  Net 1945 ml   Filed Weights   10/12/17 1200 10/12/17 1506  Weight: 99.8 kg (220 lb) 108.3 kg (238 lb 12.1 oz)    Examination:  General exam: Alert, awake, oriented x 3, flat affect, does not make eye contact Respiratory system: Clear to auscultation. Respiratory effort normal. Cardiovascular system:RRR. No murmurs, rubs, gallops. Gastrointestinal system: Abdomen is nondistended, soft and nontender. No organomegaly or masses felt. Normal bowel sounds heard. Central nervous system: Alert and oriented. No focal neurological deficits. Extremities: No C/C/E, +pedal pulses Skin: No rashes, lesions or ulcers    Data Reviewed: I have personally reviewed following labs and imaging studies  CBC: Recent Labs  Lab 10/12/17 0958 10/13/17 0620  WBC 6.4 5.3  NEUTROABS 2.5  --   HGB 13.9 11.6*  HCT 43.6 37.1  MCV 96.9 97.6  PLT 154 202*   Basic Metabolic Panel: Recent Labs  Lab 10/12/17 0958 10/13/17 0620  NA 140 139  K 3.6 4.3  CL 103 108  CO2 27  26  GLUCOSE 112* 151*  BUN 18 13  CREATININE 1.25* 0.87  CALCIUM 9.5 8.1*   GFR: Estimated Creatinine Clearance: 100.1 mL/min (by C-G formula based on SCr of 0.87 mg/dL). Liver Function Tests: Recent Labs  Lab 10/12/17 0958 10/13/17 0620  AST 27 22  ALT 22 22  ALKPHOS 108 72  BILITOT 0.3 0.3  PROT 8.1 6.4*  ALBUMIN 4.2 3.1*   No results for input(s): LIPASE, AMYLASE in the last 168 hours. No results for input(s): AMMONIA in the last 168 hours. Coagulation Profile: Recent Labs  Lab 10/12/17 1833  INR 1.01   Cardiac Enzymes: No results for input(s): CKTOTAL, CKMB, CKMBINDEX, TROPONINI in the last 168 hours. BNP (last 3 results) No results for input(s): PROBNP in the last 8760 hours. HbA1C: No results for input(s): HGBA1C in the last 72 hours. CBG: No results for input(s): GLUCAP in the last 168 hours. Lipid Profile: No results for input(s): CHOL, HDL, LDLCALC, TRIG, CHOLHDL, LDLDIRECT in the last 72 hours. Thyroid Function Tests: No results for input(s): TSH, T4TOTAL, FREET4, T3FREE, THYROIDAB in the last 72 hours. Anemia Panel: No results for input(s): VITAMINB12, FOLATE, FERRITIN, TIBC, IRON, RETICCTPCT in the last 72 hours. Urine analysis:    Component Value Date/Time   COLORURINE YELLOW 10/12/2017 [place] 10/12/2017 0944   LABSPEC 1.017 10/12/2017 0944   PHURINE 5.0 10/12/2017 [place] 10/12/2017 0944   HGBUR NEGATIVE 10/12/2017 0944   BILIRUBINUR NEGATIVE 10/12/2017 [place] 10/12/2017 0944   PROTEINUR NEGATIVE 10/12/2017 0944   NITRITE NEGATIVE 10/12/2017 0944   LEUKOCYTESUR NEGATIVE 10/12/2017 0944   Sepsis Labs: @LABRCNTIP (procalcitonin:4,lacticidven:4)  ) Recent Results (from the past 240 hour(s))  Blood Culture (routine x 2)     Status: None (Preliminary result)   Collection Time: 10/12/17 10:00 AM  Result Value Ref Range Status   Specimen Description BLOOD RIGHT ANTECUBITAL DRAWN BY RN  Final     Special Requests   Final    BOTTLES DRAWN AEROBIC AND ANAEROBIC Blood Culture adequate volume   Culture NO GROWTH < 24 HOURS  Final   Report Status PENDING  Incomplete  Blood Culture (routine x 2)     Status: None (Preliminary result)   Collection Time: 10/12/17 10:00 AM  Result Value Ref Range Status   Specimen Description   Final    BLOOD RIGHT HAND BOTTLES DRAWN AEROBIC AND ANAEROBIC   Special Requests Blood Culture adequate volume  Final   Culture NO GROWTH < 24 HOURS  Final   Report Status PENDING  Incomplete  MRSA PCR Screening     Status: None   Collection Time: 10/12/17  3:13 PM  Result Value Ref Range Status   MRSA by PCR NEGATIVE NEGATIVE Final    Comment:        The GeneXpert MRSA Assay (FDA approved for NASAL specimens only), is one component of a comprehensive MRSA colonization surveillance program. It is not intended to diagnose MRSA infection nor to guide or monitor treatment for MRSA infections.          Radiology Studies: Dg Chest 2 View  Result Date: 10/12/2017 CLINICAL DATA:  Code sepsis EXAM: CHEST - 2 VIEW COMPARISON:  08/08/2017 FINDINGS: Some increase in right middle lobe infiltrate or atelectasis. Left lung clear. Heart size and mediastinal contours are within normal limits. No effusion.  No pneumothorax. Visualized bones unremarkable. IMPRESSION: 1. Increasing right middle lobe infiltrate/atelectasis. Electronically Signed   By: Lucrezia Europe M.D.   On: 10/12/2017 10:40        Scheduled Meds: . abacavir  600 mg Oral Daily   And  . lamiVUDine  300 mg Oral Daily  . atazanavir  400 mg Oral Q breakfast  . atorvastatin  10 mg Oral BID  . divalproex  500 mg Oral 2 times per day  . enoxaparin (LOVENOX) injection  40 mg Subcutaneous Q24H  . guaiFENesin  1,200 mg Oral BID  . haloperidol  20 mg Oral TID  . hydrOXYzine  25 mg Oral QHS  .  ipratropium-albuterol  3 mL Nebulization Q6H  . magnesium oxide  400 mg Oral Daily  . mouth rinse  15 mL Mouth  Rinse BID  . QUEtiapine  100 mg Oral 2 times per day  . raltegravir  400 mg Oral BID  . trihexyphenidyl  2 mg Oral BID WC  . valACYclovir  1,000 mg Oral Daily  . zolpidem  5 mg Oral QHS   Continuous Infusions: . sodium chloride 100 mL/hr at 10/13/17 1003  . ceFEPime (MAXIPIME) IV Stopped (10/13/17 1702)     LOS: 1 day    Time spent: 35 minutes. Greater than 50% of this time was spent in direct contact with the patient coordinating care.     Lelon Frohlich, MD [facility] Pager [phone]  If 7PM-7AM, please contact night-coverage [email or web address] Password [email or web address] 10/13/2017, 5:50 PM

## 2017-10-14 MED ORDER — SULFAMETHOXAZOLE-TRIMETHOPRIM 800-160 MG PO TABS
1.0000 | ORAL_TABLET | Freq: Two times a day (BID) | ORAL | Status: DC
  Administered 2017-10-14 – 2017-10-15 (×3): 1 via ORAL
  Filled 2017-10-14 (×3): qty 1

## 2017-10-14 MED ORDER — IBUPROFEN 400 MG PO TABS
400.0000 mg | ORAL_TABLET | Freq: Three times a day (TID) | ORAL | Status: DC | PRN
  Administered 2017-10-14 – 2017-10-26 (×2): 400 mg via ORAL
  Filled 2017-10-14 (×2): qty 1

## 2017-10-14 MED ORDER — PREDNISONE 20 MG PO TABS
40.0000 mg | ORAL_TABLET | Freq: Every day | ORAL | Status: DC
  Administered 2017-10-14 – 2017-10-15 (×2): 40 mg via ORAL
  Filled 2017-10-14 (×2): qty 2

## 2017-10-14 NOTE — Progress Notes (Signed)
PROGRESS NOTE    Karina Clarke  WCB:[id number] DOB: [birth year] DOA: 10/12/2017 PCP: Rosita Fire, MD     Brief Narrative:  56 year old woman admitted from home on 11/18 due to shortness of breath.  She has a history of HIV/AIDS, schizophrenia.  Chest x-ray indicated pneumonia and she was referred for admission. Overnight 11/19-11/20 has had temp as high as 102.8 and increasing oxygen requirements.   Assessment & Plan:   Active Problems:   Sepsis ([place])   Tobacco use disorder   HIV (human immunodeficiency virus infection) ([place])   Schizophrenia ([place])   HCAP (healthcare-associated pneumonia)   Acute respiratory failure with hypoxia ([place])   AIDS ([place])   Acute hypoxemic respiratory failure -With worsening overnight, now requiring increase in oxygen. -Given history of AIDS with low CD4 count, concern for PCP pneumonia.  Please see below for details.  Healthcare associated pneumonia/early sepsis -Was initially placed on vancomycin and cefepime, MRSA PCR results are negative and vancomycin was discontinued. -Transitioned to Augmentin on 11/19 with plan to complete a total of 7 days of antibiotics. -With temperatures as high as 102.8 overnight and significantly increased oxygen requirements as well as her immune compromised state with HIV/AIDS with a last known CD4 count of 150, I believe we need to cover for PCP pneumonia.  Will start on Bactrim as well as steroids. PCP smear has been requested. This has been discussed with Dr. Johnnye Sima with infectious diseases.  HIV/AIDS -Last CD4 count in September 2018 was 150. -She has an infectious disease specialist at [facility], continue current antiretrovirals.    Schizophrenia -Mood is stable. -Continue psychotropic medications.   DVT prophylaxis: Lovenox Code Status: Full code Family Communication: Patient Disposition Plan: Pending medical stability  Consultants:   None  Procedures:   None  Antimicrobials:    Anti-infectives (From admission, onward)   Start     Dose/Rate Route Frequency Ordered Stop   10/14/17 1215  sulfamethoxazole-trimethoprim (BACTRIM DS,SEPTRA DS) 800-160 MG per tablet 1 tablet     1 tablet Oral Every 12 hours 10/14/17 1214     10/13/17 2200  amoxicillin-clavulanate (AUGMENTIN) 875-125 MG per tablet 1 tablet     1 tablet Oral Every 12 hours 10/13/17 1750     10/13/17 1000  abacavir-lamiVUDine (EPZICOM) 600-300 MG per tablet 1 tablet  Status:  Discontinued     1 tablet Oral Daily 10/12/17 1634 10/13/17 0824   10/13/17 1000  valACYclovir (VALTREX) tablet 1,000 mg     1,000 mg Oral Daily 10/12/17 1634     10/13/17 1000  abacavir (ZIAGEN) tablet 600 mg     600 mg Oral Daily 10/13/17 0824     10/13/17 1000  lamiVUDine (EPIVIR) tablet 300 mg     300 mg Oral Daily 10/13/17 0824     10/13/17 0800  atazanavir (REYATAZ) capsule 400 mg     400 mg Oral Daily with breakfast 10/12/17 1634     10/13/17 0100  ceFEPIme (MAXIPIME) 1 g in dextrose 5 % 50 mL IVPB  Status:  Discontinued     1 g 100 mL/hr over 30 Minutes Intravenous Every 8 hours 10/12/17 1732 10/13/17 1750   10/12/17 2200  vancomycin (VANCOCIN) IVPB 750 mg/150 ml premix  Status:  Discontinued     750 mg 150 mL/hr over 60 Minutes Intravenous Every 12 hours 10/12/17 1258 10/12/17 1634   10/12/17 2200  raltegravir (ISENTRESS) tablet 400 mg     400 mg Oral 2 times daily 10/12/17 1634  10/12/17 1700  piperacillin-tazobactam (ZOSYN) IVPB 3.375 g  Status:  Discontinued     3.375 g 12.5 mL/hr over 240 Minutes Intravenous Every 8 hours 10/12/17 1258 10/12/17 1634   10/12/17 1700  ceFEPIme (MAXIPIME) 2 g in dextrose 5 % 50 mL IVPB     2 g 100 mL/hr over 30 Minutes Intravenous  Once 10/12/17 1634 10/12/17 1733   10/12/17 1645  vancomycin (VANCOCIN) IVPB 1000 mg/200 mL premix  Status:  Discontinued     1,000 mg 200 mL/hr over 60 Minutes Intravenous  Once 10/12/17 1634 10/12/17 1657   10/12/17 1230  vancomycin (VANCOCIN) IVPB  1000 mg/200 mL premix     1,000 mg 200 mL/hr over 60 Minutes Intravenous  Once 10/12/17 1229 10/12/17 1404   10/12/17 1030  piperacillin-tazobactam (ZOSYN) IVPB 3.375 g     3.375 g 100 mL/hr over 30 Minutes Intravenous  Once 10/12/17 1025 10/12/17 1113   10/12/17 1015  sulfamethoxazole-trimethoprim (BACTRIM DS,SEPTRA DS) 800-160 MG per tablet 1 tablet     1 tablet Oral  Once 10/12/17 1003 10/12/17 1020   10/12/17 1000  cefTRIAXone (ROCEPHIN) 1 g in dextrose 5 % 50 mL IVPB  Status:  Discontinued     1 g 100 mL/hr over 30 Minutes Intravenous  Once 10/12/17 0951 10/12/17 0952   10/12/17 1000  azithromycin (ZITHROMAX) 500 mg in dextrose 5 % 250 mL IVPB  Status:  Discontinued     500 mg 250 mL/hr over 60 Minutes Intravenous  Once 10/12/17 0951 10/12/17 0952   10/12/17 1000  vancomycin (VANCOCIN) IVPB 1000 mg/200 mL premix     1,000 mg 200 mL/hr over 60 Minutes Intravenous  Once 10/12/17 0954 10/12/17 1139   10/12/17 1000  piperacillin-tazobactam (ZOSYN) IVPB 2.25 g  Status:  Discontinued     2.25 g 100 mL/hr over 30 Minutes Intravenous  Once 10/12/17 0954 10/12/17 1025       Subjective: Feels more SOB today. No CP.  Objective: Vitals:   10/14/17 1415 10/14/17 1437 10/14/17 1526 10/14/17 1703  BP:      Pulse:      Resp: (!) 29     Temp:   (!) 101.8 F (38.8 C) 99.7 F (37.6 C)  TempSrc:   Oral Oral  SpO2:  95%    Weight:      Height:        Intake/Output Summary (Last 24 hours) at 10/14/2017 1737 Last data filed at 10/14/2017 1500 Gross per 24 hour  Intake 2910 ml  Output 500 ml  Net 2410 ml   Filed Weights   10/12/17 1200 10/12/17 1506  Weight: 99.8 kg (220 lb) 108.3 kg (238 lb 12.1 oz)    Examination:  General exam: Alert, awake, oriented x 3, flat affect Respiratory system: Coarse bilateral breath sounds with expiratory wheezes. Cardiovascular system:RRR. No murmurs, rubs, gallops. Gastrointestinal system: Abdomen is nondistended, soft and nontender. No  organomegaly or masses felt. Normal bowel sounds heard. Central nervous system: Alert and oriented. No focal neurological deficits. Extremities: No C/C/E, +pedal pulses Skin: No rashes, lesions or ulcers     Data Reviewed: I have personally reviewed following labs and imaging studies  CBC: Recent Labs  Lab 10/12/17 0958 10/13/17 0620  WBC 6.4 5.3  NEUTROABS 2.5  --   HGB 13.9 11.6*  HCT 43.6 37.1  MCV 96.9 97.6  PLT 154 884*   Basic Metabolic Panel: Recent Labs  Lab 10/12/17 0958 10/13/17 0620  NA 140 139  K 3.6  4.3  CL 103 108  CO2 27 26  GLUCOSE 112* 151*  BUN 18 13  CREATININE 1.25* 0.87  CALCIUM 9.5 8.1*   GFR: Estimated Creatinine Clearance: 100.1 mL/min (by C-G formula based on SCr of 0.87 mg/dL). Liver Function Tests: Recent Labs  Lab 10/12/17 0958 10/13/17 0620  AST 27 22  ALT 22 22  ALKPHOS 108 72  BILITOT 0.3 0.3  PROT 8.1 6.4*  ALBUMIN 4.2 3.1*   No results for input(s): LIPASE, AMYLASE in the last 168 hours. No results for input(s): AMMONIA in the last 168 hours. Coagulation Profile: Recent Labs  Lab 10/12/17 1833  INR 1.01   Cardiac Enzymes: No results for input(s): CKTOTAL, CKMB, CKMBINDEX, TROPONINI in the last 168 hours. BNP (last 3 results) No results for input(s): PROBNP in the last 8760 hours. HbA1C: No results for input(s): HGBA1C in the last 72 hours. CBG: No results for input(s): GLUCAP in the last 168 hours. Lipid Profile: No results for input(s): CHOL, HDL, LDLCALC, TRIG, CHOLHDL, LDLDIRECT in the last 72 hours. Thyroid Function Tests: No results for input(s): TSH, T4TOTAL, FREET4, T3FREE, THYROIDAB in the last 72 hours. Anemia Panel: No results for input(s): VITAMINB12, FOLATE, FERRITIN, TIBC, IRON, RETICCTPCT in the last 72 hours. Urine analysis:    Component Value Date/Time   COLORURINE YELLOW 10/12/2017 [place] 10/12/2017 0944   LABSPEC 1.017 10/12/2017 0944   PHURINE 5.0 10/12/2017 [place] 10/12/2017 0944   HGBUR NEGATIVE 10/12/2017 0944   BILIRUBINUR NEGATIVE 10/12/2017 [place] 10/12/2017 0944   PROTEINUR NEGATIVE 10/12/2017 0944   NITRITE NEGATIVE 10/12/2017 0944   LEUKOCYTESUR NEGATIVE 10/12/2017 0944   Sepsis Labs: @LABRCNTIP (procalcitonin:4,lacticidven:4)  ) Recent Results (from the past 240 hour(s))  Blood Culture (routine x 2)     Status: None (Preliminary result)   Collection Time: 10/12/17 10:00 AM  Result Value Ref Range Status   Specimen Description BLOOD RIGHT ANTECUBITAL DRAWN BY RN  Final   Special Requests   Final    BOTTLES DRAWN AEROBIC AND ANAEROBIC Blood Culture adequate volume   Culture NO GROWTH 2 DAYS  Final   Report Status PENDING  Incomplete  Blood Culture (routine x 2)     Status: None (Preliminary result)   Collection Time: 10/12/17 10:00 AM  Result Value Ref Range Status   Specimen Description   Final    BLOOD RIGHT HAND BOTTLES DRAWN AEROBIC AND ANAEROBIC   Special Requests Blood Culture adequate volume  Final   Culture NO GROWTH 2 DAYS  Final   Report Status PENDING  Incomplete  MRSA PCR Screening     Status: None   Collection Time: 10/12/17  3:13 PM  Result Value Ref Range Status   MRSA by PCR NEGATIVE NEGATIVE Final    Comment:        The GeneXpert MRSA Assay (FDA approved for NASAL specimens only), is one component of a comprehensive MRSA colonization surveillance program. It is not intended to diagnose MRSA infection nor to guide or monitor treatment for MRSA infections.          Radiology Studies: No results found.      Scheduled Meds: . abacavir  600 mg Oral Daily   And  . lamiVUDine  300 mg Oral Daily  . amoxicillin-clavulanate  1 tablet Oral Q12H  . atazanavir  400 mg Oral Q breakfast  . atorvastatin  10 mg Oral BID  . divalproex  500 mg Oral  2 times per day  . enoxaparin (LOVENOX) injection  40 mg Subcutaneous Q24H  . guaiFENesin  1,200 mg Oral BID  . haloperidol   20 mg Oral TID  . hydrOXYzine  25 mg Oral QHS  . ipratropium-albuterol  3 mL Nebulization Q6H  . magnesium oxide  400 mg Oral Daily  . mouth rinse  15 mL Mouth Rinse BID  . predniSONE  40 mg Oral Q breakfast  . QUEtiapine  100 mg Oral 2 times per day  . raltegravir  400 mg Oral BID  . sulfamethoxazole-trimethoprim  1 tablet Oral Q12H  . trihexyphenidyl  2 mg Oral BID WC  . valACYclovir  1,000 mg Oral Daily  . zolpidem  5 mg Oral QHS   Continuous Infusions: . sodium chloride 100 mL/hr at 10/13/17 2156     LOS: 2 days    Time spent: 35 minutes. Greater than 50% of this time was spent in direct contact with the patient coordinating care.     Lelon Frohlich, MD [facility] Pager [phone]  If 7PM-7AM, please contact night-coverage [email or web address] Password [facility] 10/14/2017, 5:37 PM

## 2017-10-15 ENCOUNTER — Inpatient Hospital Stay (HOSPITAL_COMMUNITY): Payer: Medicaid Other

## 2017-10-15 MED ORDER — SULFAMETHOXAZOLE-TRIMETHOPRIM 800-160 MG PO TABS
1.0000 | ORAL_TABLET | Freq: Once | ORAL | Status: AC
  Administered 2017-10-15: 1 via ORAL
  Filled 2017-10-15: qty 1

## 2017-10-15 MED ORDER — ALBUTEROL SULFATE (2.5 MG/3ML) 0.083% IN NEBU
INHALATION_SOLUTION | RESPIRATORY_TRACT | Status: AC
  Administered 2017-10-15: 2.5 mg
  Filled 2017-10-15: qty 3

## 2017-10-15 MED ORDER — SULFAMETHOXAZOLE-TRIMETHOPRIM 800-160 MG PO TABS: 3.0000 | ORAL_TABLET | Freq: Three times a day (TID) | ORAL | Status: DC

## 2017-10-15 MED ORDER — PREDNISONE 20 MG PO TABS
40.0000 mg | ORAL_TABLET | Freq: Two times a day (BID) | ORAL | Status: DC
  Administered 2017-10-15 – 2017-10-17 (×5): 40 mg via ORAL
  Filled 2017-10-15 (×5): qty 2

## 2017-10-15 MED ORDER — SULFAMETHOXAZOLE-TRIMETHOPRIM 800-160 MG PO TABS
2.0000 | ORAL_TABLET | Freq: Three times a day (TID) | ORAL | Status: DC
  Administered 2017-10-15 – 2017-10-26 (×33): 2 via ORAL
  Filled 2017-10-15 (×33): qty 2

## 2017-10-15 NOTE — Clinical Social Work Note (Signed)
SW aware of pt's admission and that she is from [place]. Discussed pt with RN CM today and she stated pt will likely remain hospitalized for several days yet. SW will follow up on Friday to further assist with dc planning.

## 2017-10-15 NOTE — Progress Notes (Signed)
PROGRESS NOTE    Karina Clarke  ZOX:[id number] DOB: [birth year] DOA: 10/12/2017 PCP: Rosita Fire, MD     Brief Narrative:  56 year old woman admitted from her group home on 11/18 due to shortness of breath.  She has past medical history significant for HIV/AIDS, schizophrenia.  Chest x-ray showed pneumonia and she was admitted.  She was improving, antibiotics were switched over to Augmentin.  Overnight on 11/19 had temp as high as 102.8 with increasing oxygen requirements.   Assessment & Plan:   Active Problems:   Sepsis ([place])   Tobacco use disorder   HIV (human immunodeficiency virus infection) ([place])   Schizophrenia ([place])   HCAP (healthcare-associated pneumonia)   Acute respiratory failure with hypoxia ([place])   AIDS ([place])   Acute hypoxemic respiratory failure -Believed due to community-acquired pneumonia plus minus pulmonary edema from aggressive volume resuscitation. -She is requiring 8-10 L of high flow nasal cannula. -We will check chest x-ray and consider Lasix if signs of pulmonary edema. -Please see below for treatment of pneumonia.  Community-acquired pneumonia -Was initially placed on vancomycin and cefepime, MRSA PCR was negative and vancomycin was discontinued. -She was subsequently transitioned over to Augmentin on 11/19 with plan to complete a total of 7 days of antibiotics. -On the night of 11/19 developed fevers and increasing oxygen requirements.  Given her immune compromised state and after discussion with Dr. Johnnye Sima with infectious diseases, we have decided to cover her for potential PCP pneumonia as well. -She has been started on steroid taper as well as high-dose Bactrim.  PCP smear has been requested.  HIV/AIDS -Last known CD4 count in September 2018 was 150. -She does not appear to have been on PCP or MAC prophylaxis prior to admission. -She follows with infectious disease at [facility]. -Continue current antiretrovirals.  Schizophrenia -Mood  stable, continue psychotropic medications.  DVT prophylaxis: Lovenox Code Status: Full code Family Communication: Patient only Disposition Plan: Back to group home pending medical stability  Consultants:   None  Procedures:   None  Antimicrobials:  Anti-infectives (From admission, onward)   Start     Dose/Rate Route Frequency Ordered Stop   10/15/17 1600  sulfamethoxazole-trimethoprim (BACTRIM DS,SEPTRA DS) 800-160 MG per tablet 3 tablet  Status:  Discontinued     3 tablet Oral 3 times daily 10/15/17 1008 10/15/17 1017   10/15/17 1600  sulfamethoxazole-trimethoprim (BACTRIM DS,SEPTRA DS) 800-160 MG per tablet 2 tablet     2 tablet Oral 3 times daily 10/15/17 1017     10/15/17 1030  sulfamethoxazole-trimethoprim (BACTRIM DS,SEPTRA DS) 800-160 MG per tablet 1 tablet     1 tablet Oral  Once 10/15/17 1018 10/15/17 1045   10/14/17 1215  sulfamethoxazole-trimethoprim (BACTRIM DS,SEPTRA DS) 800-160 MG per tablet 1 tablet  Status:  Discontinued     1 tablet Oral Every 12 hours 10/14/17 1214 10/15/17 1008   10/13/17 2200  amoxicillin-clavulanate (AUGMENTIN) 875-125 MG per tablet 1 tablet     1 tablet Oral Every 12 hours 10/13/17 1750     10/13/17 1000  abacavir-lamiVUDine (EPZICOM) 600-300 MG per tablet 1 tablet  Status:  Discontinued     1 tablet Oral Daily 10/12/17 1634 10/13/17 0824   10/13/17 1000  valACYclovir (VALTREX) tablet 1,000 mg     1,000 mg Oral Daily 10/12/17 1634     10/13/17 1000  abacavir (ZIAGEN) tablet 600 mg     600 mg Oral Daily 10/13/17 0824     10/13/17 1000  lamiVUDine (EPIVIR) tablet 300 mg  300 mg Oral Daily 10/13/17 0824     10/13/17 0800  atazanavir (REYATAZ) capsule 400 mg     400 mg Oral Daily with breakfast 10/12/17 1634     10/13/17 0100  ceFEPIme (MAXIPIME) 1 g in dextrose 5 % 50 mL IVPB  Status:  Discontinued     1 g 100 mL/hr over 30 Minutes Intravenous Every 8 hours 10/12/17 1732 10/13/17 1750   10/12/17 2200  vancomycin (VANCOCIN) IVPB 750  mg/150 ml premix  Status:  Discontinued     750 mg 150 mL/hr over 60 Minutes Intravenous Every 12 hours 10/12/17 1258 10/12/17 1634   10/12/17 2200  raltegravir (ISENTRESS) tablet 400 mg     400 mg Oral 2 times daily 10/12/17 1634     10/12/17 1700  piperacillin-tazobactam (ZOSYN) IVPB 3.375 g  Status:  Discontinued     3.375 g 12.5 mL/hr over 240 Minutes Intravenous Every 8 hours 10/12/17 1258 10/12/17 1634   10/12/17 1700  ceFEPIme (MAXIPIME) 2 g in dextrose 5 % 50 mL IVPB     2 g 100 mL/hr over 30 Minutes Intravenous  Once 10/12/17 1634 10/12/17 1733   10/12/17 1645  vancomycin (VANCOCIN) IVPB 1000 mg/200 mL premix  Status:  Discontinued     1,000 mg 200 mL/hr over 60 Minutes Intravenous  Once 10/12/17 1634 10/12/17 1657   10/12/17 1230  vancomycin (VANCOCIN) IVPB 1000 mg/200 mL premix     1,000 mg 200 mL/hr over 60 Minutes Intravenous  Once 10/12/17 1229 10/12/17 1404   10/12/17 1030  piperacillin-tazobactam (ZOSYN) IVPB 3.375 g     3.375 g 100 mL/hr over 30 Minutes Intravenous  Once 10/12/17 1025 10/12/17 1113   10/12/17 1015  sulfamethoxazole-trimethoprim (BACTRIM DS,SEPTRA DS) 800-160 MG per tablet 1 tablet     1 tablet Oral  Once 10/12/17 1003 10/12/17 1020   10/12/17 1000  cefTRIAXone (ROCEPHIN) 1 g in dextrose 5 % 50 mL IVPB  Status:  Discontinued     1 g 100 mL/hr over 30 Minutes Intravenous  Once 10/12/17 0951 10/12/17 0952   10/12/17 1000  azithromycin (ZITHROMAX) 500 mg in dextrose 5 % 250 mL IVPB  Status:  Discontinued     500 mg 250 mL/hr over 60 Minutes Intravenous  Once 10/12/17 0951 10/12/17 0952   10/12/17 1000  vancomycin (VANCOCIN) IVPB 1000 mg/200 mL premix     1,000 mg 200 mL/hr over 60 Minutes Intravenous  Once 10/12/17 0954 10/12/17 1139   10/12/17 1000  piperacillin-tazobactam (ZOSYN) IVPB 2.25 g  Status:  Discontinued     2.25 g 100 mL/hr over 30 Minutes Intravenous  Once 10/12/17 0954 10/12/17 1025       Subjective: Very flat affect, states she  feels "fine".  Objective: Vitals:   10/15/17 0532 10/15/17 0742 10/15/17 1110 10/15/17 1451  BP: 125/77   112/71  Pulse: 79   91  Resp: 19   20  Temp: 99.3 F (37.4 C)   98.8 F (37.1 C)  TempSrc: Oral   Oral  SpO2: 100% 95% 92% 90%  Weight:      Height:        Intake/Output Summary (Last 24 hours) at 10/15/2017 1642 Last data filed at 10/15/2017 1213 Gross per 24 hour  Intake 120 ml  Output 2650 ml  Net -2530 ml   Filed Weights   10/12/17 1200 10/12/17 1506  Weight: 99.8 kg (220 lb) 108.3 kg (238 lb 12.1 oz)    Examination:  General exam:  Alert, awake, oriented x 3, flat affect Respiratory system: Coarse bilateral breath sounds, improved wheezing from one day ago Cardiovascular system:RRR. No murmurs, rubs, gallops. Gastrointestinal system: Abdomen is nondistended, soft and nontender. No organomegaly or masses felt. Normal bowel sounds heard. Central nervous system: Alert and oriented. No focal neurological deficits. Extremities: No C/C/E, +pedal pulses Skin: No rashes, lesions or ulcers Psychiatry: Flat affect    Data Reviewed: I have personally reviewed following labs and imaging studies  CBC: Recent Labs  Lab 10/12/17 0958 10/13/17 0620  WBC 6.4 5.3  NEUTROABS 2.5  --   HGB 13.9 11.6*  HCT 43.6 37.1  MCV 96.9 97.6  PLT 154 527*   Basic Metabolic Panel: Recent Labs  Lab 10/12/17 0958 10/13/17 0620  NA 140 139  K 3.6 4.3  CL 103 108  CO2 27 26  GLUCOSE 112* 151*  BUN 18 13  CREATININE 1.25* 0.87  CALCIUM 9.5 8.1*   GFR: Estimated Creatinine Clearance: 100.1 mL/min (by C-G formula based on SCr of 0.87 mg/dL). Liver Function Tests: Recent Labs  Lab 10/12/17 0958 10/13/17 0620  AST 27 22  ALT 22 22  ALKPHOS 108 72  BILITOT 0.3 0.3  PROT 8.1 6.4*  ALBUMIN 4.2 3.1*   No results for input(s): LIPASE, AMYLASE in the last 168 hours. No results for input(s): AMMONIA in the last 168 hours. Coagulation Profile: Recent Labs  Lab  10/12/17 1833  INR 1.01   Cardiac Enzymes: No results for input(s): CKTOTAL, CKMB, CKMBINDEX, TROPONINI in the last 168 hours. BNP (last 3 results) No results for input(s): PROBNP in the last 8760 hours. HbA1C: No results for input(s): HGBA1C in the last 72 hours. CBG: No results for input(s): GLUCAP in the last 168 hours. Lipid Profile: No results for input(s): CHOL, HDL, LDLCALC, TRIG, CHOLHDL, LDLDIRECT in the last 72 hours. Thyroid Function Tests: No results for input(s): TSH, T4TOTAL, FREET4, T3FREE, THYROIDAB in the last 72 hours. Anemia Panel: No results for input(s): VITAMINB12, FOLATE, FERRITIN, TIBC, IRON, RETICCTPCT in the last 72 hours. Urine analysis:    Component Value Date/Time   COLORURINE YELLOW 10/12/2017 [place] 10/12/2017 0944   LABSPEC 1.017 10/12/2017 0944   PHURINE 5.0 10/12/2017 [place] 10/12/2017 [place] 10/12/2017 [place] 10/12/2017 [place] 10/12/2017 0944   PROTEINUR NEGATIVE 10/12/2017 0944   NITRITE NEGATIVE 10/12/2017 0944   LEUKOCYTESUR NEGATIVE 10/12/2017 0944   Sepsis Labs: _0 (procalcitonin:4,lacticidven:4)  ) Recent Results (from the past 240 hour(s))  Blood Culture (routine x 2)     Status: None (Preliminary result)   Collection Time: 10/12/17 10:00 AM  Result Value Ref Range Status   Specimen Description BLOOD RIGHT ANTECUBITAL DRAWN BY RN  Final   Special Requests   Final    BOTTLES DRAWN AEROBIC AND ANAEROBIC Blood Culture adequate volume   Culture NO GROWTH 3 DAYS  Final   Report Status PENDING  Incomplete  Blood Culture (routine x 2)     Status: None (Preliminary result)   Collection Time: 10/12/17 10:00 AM  Result Value Ref Range Status   Specimen Description   Final    BLOOD RIGHT HAND BOTTLES DRAWN AEROBIC AND ANAEROBIC   Special Requests Blood Culture adequate volume  Final   Culture NO GROWTH 3 DAYS  Final   Report Status  PENDING  Incomplete  MRSA PCR Screening     Status: None   Collection Time:  10/12/17  3:13 PM  Result Value Ref Range Status   MRSA by PCR NEGATIVE NEGATIVE Final    Comment:        The GeneXpert MRSA Assay (FDA approved for NASAL specimens only), is one component of a comprehensive MRSA colonization surveillance program. It is not intended to diagnose MRSA infection nor to guide or monitor treatment for MRSA infections.          Radiology Studies: No results found.      Scheduled Meds: . abacavir  600 mg Oral Daily   And  . lamiVUDine  300 mg Oral Daily  . amoxicillin-clavulanate  1 tablet Oral Q12H  . atazanavir  400 mg Oral Q breakfast  . atorvastatin  10 mg Oral BID  . divalproex  500 mg Oral 2 times per day  . enoxaparin (LOVENOX) injection  40 mg Subcutaneous Q24H  . guaiFENesin  1,200 mg Oral BID  . haloperidol  20 mg Oral TID  . hydrOXYzine  25 mg Oral QHS  . ipratropium-albuterol  3 mL Nebulization Q6H  . magnesium oxide  400 mg Oral Daily  . mouth rinse  15 mL Mouth Rinse BID  . predniSONE  40 mg Oral BID WC  . QUEtiapine  100 mg Oral 2 times per day  . raltegravir  400 mg Oral BID  . sulfamethoxazole-trimethoprim  2 tablet Oral TID  . trihexyphenidyl  2 mg Oral BID WC  . valACYclovir  1,000 mg Oral Daily  . zolpidem  5 mg Oral QHS   Continuous Infusions:   LOS: 3 days    Time spent: 25 minutes. Greater than 50% of this time was spent in direct contact with the patient coordinating care.     Lelon Frohlich, MD [facility] Pager [phone]  If 7PM-7AM, please contact night-coverage [email or web address] Password [facility] 10/15/2017, 4:42 PM

## 2017-10-16 LAB — CBC
HEMATOCRIT: 37.5 % (ref 36.0–46.0)
Hemoglobin: 11.3 g/dL — ABNORMAL LOW (ref 12.0–15.0)
MCH: 30.4 pg (ref 26.0–34.0)
MCHC: 30.1 g/dL (ref 30.0–36.0)
MCV: 100.8 fL — ABNORMAL HIGH (ref 78.0–100.0)
Platelets: 136 10*3/uL — ABNORMAL LOW (ref 150–400)
RBC: 3.72 MIL/uL — ABNORMAL LOW (ref 3.87–5.11)
RDW: 16 % — AB (ref 11.5–15.5)
WBC: 5.3 10*3/uL (ref 4.0–10.5)

## 2017-10-16 LAB — BASIC METABOLIC PANEL
ANION GAP: 5 (ref 5–15)
BUN: 14 mg/dL (ref 6–20)
CALCIUM: 8.5 mg/dL — AB (ref 8.9–10.3)
CO2: 29 mmol/L (ref 22–32)
CREATININE: 0.91 mg/dL (ref 0.44–1.00)
Chloride: 104 mmol/L (ref 101–111)
GFR calc non Af Amer: >60 mL/min (ref >60)
Glucose, Bld: 163 mg/dL — ABNORMAL HIGH (ref 65–99)
Potassium: 5 mmol/L (ref 3.5–5.1)
SODIUM: 138 mmol/L (ref 135–145)

## 2017-10-16 MED ORDER — ALBUTEROL SULFATE (2.5 MG/3ML) 0.083% IN NEBU
2.5000 mg | INHALATION_SOLUTION | RESPIRATORY_TRACT | Status: DC | PRN
  Administered 2017-10-16: 2.5 mg via RESPIRATORY_TRACT
  Filled 2017-10-16: qty 3

## 2017-10-16 MED ORDER — FUROSEMIDE 10 MG/ML IJ SOLN
40.0000 mg | Freq: Once | INTRAMUSCULAR | Status: AC
  Administered 2017-10-16: 40 mg via INTRAVENOUS
  Filled 2017-10-16: qty 4

## 2017-10-16 MED ORDER — METHYLPREDNISOLONE SODIUM SUCC 125 MG IJ SOLR: 60.0000 mg | Freq: Four times a day (QID) | INTRAMUSCULAR | Status: DC

## 2017-10-16 NOTE — Progress Notes (Signed)
Pt had o2 out of her nose upon entering room and her spo2 78% on room air breathing treatment given and HFNC back on pt @ 12lpm cann

## 2017-10-16 NOTE — Progress Notes (Signed)
PROGRESS NOTE    Karina Clarke  URK:[id number] DOB: December 03, 1960 DOA: 10/12/2017 PCP: Rosita Fire, MD     Brief Narrative:  56 year old woman admitted from a group home on 11/18 due to shortness of breath.  Found to have pneumonia.  Past medical history significant for HIV/AIDS and schizophrenia.  She still remains with very high oxygen requirements.   Assessment & Plan:   Active Problems:   Sepsis ([place])   Tobacco use disorder   HIV (human immunodeficiency virus infection) ([place])   Schizophrenia ([place])   HCAP (healthcare-associated pneumonia)   Acute respiratory failure with hypoxia ([place])   AIDS ([place])   Acute hypoxemic respiratory failure -I believe this is due to community-acquired pneumonia as well as possibly a component of mild pulmonary edema from aggressive volume resuscitation. -She has never had an echo, will request. -She is now requiring 14-15 L of oxygen via high flow nasal cannula. -Chest x-ray from yesterday did show signs of pulmonary vascular congestion and was given 40 mg of IV Lasix. -We will give an extra dose of IV Lasix today. -She also has significant wheezing on exam, will start scheduled nebulizer treatments, she is already on prednisone. -Please see below for treatment of pneumonia.  Community-acquired pneumonia - initially placed on vancomycin and cefepime, MRSA PCR was negative and vancomycin was discontinued. -She was subsequently transitioned over to Augmentin on 11/19 with plan to complete a total of 7 days of antibiotics. -Due to increased oxygen requirements and immunocompromise state, patient's case was discussed with infectious diseases, Dr. Johnnye Sima, we have decided to cover her for potential PCP pneumonia as well. -Continue steroid taper as well as high-dose Bactrim. -PCP smear has been requested.  HIV/AIDS -Last known CD4 count in September 2018 was 150. -Continue current antiretroviral therapy and follow-up with infectious disease  specialist at [facility], continue current psychotropic medications.   DVT prophylaxis: Lovenox Code Status: Full code Family Communication: Patient only Disposition Plan: Back to group home pending medical stability  Consultants:   None  Procedures:   None  Antimicrobials:  Anti-infectives (From admission, onward)   Start     Dose/Rate Route Frequency Ordered Stop   10/15/17 1600  sulfamethoxazole-trimethoprim (BACTRIM DS,SEPTRA DS) 800-160 MG per tablet 3 tablet  Status:  Discontinued     3 tablet Oral 3 times daily 10/15/17 1008 10/15/17 1017   10/15/17 1600  sulfamethoxazole-trimethoprim (BACTRIM DS,SEPTRA DS) 800-160 MG per tablet 2 tablet     2 tablet Oral 3 times daily 10/15/17 1017     10/15/17 1030  sulfamethoxazole-trimethoprim (BACTRIM DS,SEPTRA DS) 800-160 MG per tablet 1 tablet     1 tablet Oral  Once 10/15/17 1018 10/15/17 1045   10/14/17 1215  sulfamethoxazole-trimethoprim (BACTRIM DS,SEPTRA DS) 800-160 MG per tablet 1 tablet  Status:  Discontinued     1 tablet Oral Every 12 hours 10/14/17 1214 10/15/17 1008   10/13/17 2200  amoxicillin-clavulanate (AUGMENTIN) 875-125 MG per tablet 1 tablet     1 tablet Oral Every 12 hours 10/13/17 1750     10/13/17 1000  abacavir-lamiVUDine (EPZICOM) 600-300 MG per tablet 1 tablet  Status:  Discontinued     1 tablet Oral Daily 10/12/17 1634 10/13/17 0824   10/13/17 1000  valACYclovir (VALTREX) tablet 1,000 mg     1,000 mg Oral Daily 10/12/17 1634     10/13/17 1000  abacavir (ZIAGEN) tablet 600 mg     600 mg Oral Daily 10/13/17 0824     10/13/17 1000  lamiVUDine (EPIVIR) tablet 300 mg     300 mg Oral Daily 10/13/17 0824     10/13/17 0800  atazanavir (REYATAZ) capsule 400 mg     400 mg Oral Daily with breakfast 10/12/17 1634     10/13/17 0100  ceFEPIme (MAXIPIME) 1 g in dextrose 5 % 50 mL IVPB  Status:  Discontinued     1 g 100 mL/hr over 30 Minutes Intravenous Every 8 hours 10/12/17 1732 10/13/17  1750   10/12/17 2200  vancomycin (VANCOCIN) IVPB 750 mg/150 ml premix  Status:  Discontinued     750 mg 150 mL/hr over 60 Minutes Intravenous Every 12 hours 10/12/17 1258 10/12/17 1634   10/12/17 2200  raltegravir (ISENTRESS) tablet 400 mg     400 mg Oral 2 times daily 10/12/17 1634     10/12/17 1700  piperacillin-tazobactam (ZOSYN) IVPB 3.375 g  Status:  Discontinued     3.375 g 12.5 mL/hr over 240 Minutes Intravenous Every 8 hours 10/12/17 1258 10/12/17 1634   10/12/17 1700  ceFEPIme (MAXIPIME) 2 g in dextrose 5 % 50 mL IVPB     2 g 100 mL/hr over 30 Minutes Intravenous  Once 10/12/17 1634 10/12/17 1733   10/12/17 1645  vancomycin (VANCOCIN) IVPB 1000 mg/200 mL premix  Status:  Discontinued     1,000 mg 200 mL/hr over 60 Minutes Intravenous  Once 10/12/17 1634 10/12/17 1657   10/12/17 1230  vancomycin (VANCOCIN) IVPB 1000 mg/200 mL premix     1,000 mg 200 mL/hr over 60 Minutes Intravenous  Once 10/12/17 1229 10/12/17 1404   10/12/17 1030  piperacillin-tazobactam (ZOSYN) IVPB 3.375 g     3.375 g 100 mL/hr over 30 Minutes Intravenous  Once 10/12/17 1025 10/12/17 1113   10/12/17 1015  sulfamethoxazole-trimethoprim (BACTRIM DS,SEPTRA DS) 800-160 MG per tablet 1 tablet     1 tablet Oral  Once 10/12/17 1003 10/12/17 1020   10/12/17 1000  cefTRIAXone (ROCEPHIN) 1 g in dextrose 5 % 50 mL IVPB  Status:  Discontinued     1 g 100 mL/hr over 30 Minutes Intravenous  Once 10/12/17 0951 10/12/17 0952   10/12/17 1000  azithromycin (ZITHROMAX) 500 mg in dextrose 5 % 250 mL IVPB  Status:  Discontinued     500 mg 250 mL/hr over 60 Minutes Intravenous  Once 10/12/17 0951 10/12/17 0952   10/12/17 1000  vancomycin (VANCOCIN) IVPB 1000 mg/200 mL premix     1,000 mg 200 mL/hr over 60 Minutes Intravenous  Once 10/12/17 0954 10/12/17 1139   10/12/17 1000  piperacillin-tazobactam (ZOSYN) IVPB 2.25 g  Status:  Discontinued     2.25 g 100 mL/hr over 30 Minutes Intravenous  Once 10/12/17 0954 10/12/17 1025         Subjective: When asked how she feels, she states her breathing feels tight.  Objective: Vitals:   10/16/17 0740 10/16/17 1238 10/16/17 1316 10/16/17 1443  BP:   (!) 109/49   Pulse:   77   Resp:   20   Temp:   98.6 F (37 C)   TempSrc:   Oral   SpO2: 92% 90% 93% 90%  Weight:      Height:        Intake/Output Summary (Last 24 hours) at 10/16/2017 1632 Last data filed at 10/16/2017 1553 Gross per 24 hour  Intake 720 ml  Output 2800 ml  Net -2080 ml   Filed Weights   10/12/17 1200 10/12/17 1506  Weight: 99.8 kg (220 lb) 108.3  kg (238 lb 12.1 oz)    Examination:  General exam: Alert, awake, oriented x 3, flat affect Respiratory system: Coarse bilateral breath sounds, expiratory wheezes Cardiovascular system:RRR. No murmurs, rubs, gallops. Gastrointestinal system: Abdomen is nondistended, soft and nontender. No organomegaly or masses felt. Normal bowel sounds heard. Central nervous system: Alert and oriented. No focal neurological deficits. Extremities: Trace bilateral edema, positive pedal pulses Skin: No rashes, lesions or ulcers Psychiatry: Flat affect    Data Reviewed: I have personally reviewed following labs and imaging studies  CBC: Recent Labs  Lab 10/12/17 0958 10/13/17 0620 10/16/17 0439  WBC 6.4 5.3 5.3  NEUTROABS 2.5  --   --   HGB 13.9 11.6* 11.3*  HCT 43.6 37.1 37.5  MCV 96.9 97.6 100.8*  PLT 154 129* 601*   Basic Metabolic Panel: Recent Labs  Lab 10/12/17 0958 10/13/17 0620 10/16/17 0439  NA 140 139 138  K 3.6 4.3 5.0  CL 103 108 104  CO2 27 26 29   GLUCOSE 112* 151* 163*  BUN 18 13 14   CREATININE 1.25* 0.87 0.91  CALCIUM 9.5 8.1* 8.5*   GFR: Estimated Creatinine Clearance: 95.7 mL/min (by C-G formula based on SCr of 0.91 mg/dL). Liver Function Tests: Recent Labs  Lab 10/12/17 0958 10/13/17 0620  AST 27 22  ALT 22 22  ALKPHOS 108 72  BILITOT 0.3 0.3  PROT 8.1 6.4*  ALBUMIN 4.2 3.1*   No results for input(s): LIPASE,  AMYLASE in the last 168 hours. No results for input(s): AMMONIA in the last 168 hours. Coagulation Profile: Recent Labs  Lab 10/12/17 1833  INR 1.01   Cardiac Enzymes: No results for input(s): CKTOTAL, CKMB, CKMBINDEX, TROPONINI in the last 168 hours. BNP (last 3 results) No results for input(s): PROBNP in the last 8760 hours. HbA1C: No results for input(s): HGBA1C in the last 72 hours. CBG: No results for input(s): GLUCAP in the last 168 hours. Lipid Profile: No results for input(s): CHOL, HDL, LDLCALC, TRIG, CHOLHDL, LDLDIRECT in the last 72 hours. Thyroid Function Tests: No results for input(s): TSH, T4TOTAL, FREET4, T3FREE, THYROIDAB in the last 72 hours. Anemia Panel: No results for input(s): VITAMINB12, FOLATE, FERRITIN, TIBC, IRON, RETICCTPCT in the last 72 hours. Urine analysis:    Component Value Date/Time   COLORURINE YELLOW 10/12/2017 [place] 10/12/2017 0944   LABSPEC 1.017 10/12/2017 0944   PHURINE 5.0 10/12/2017 [place] 10/12/2017 0944   HGBUR NEGATIVE 10/12/2017 [place] 10/12/2017 [place] 10/12/2017 0944   PROTEINUR NEGATIVE 10/12/2017 0944   NITRITE NEGATIVE 10/12/2017 0944   LEUKOCYTESUR NEGATIVE 10/12/2017 0944   Sepsis Labs: @LABRCNTIP (procalcitonin:4,lacticidven:4)  ) Recent Results (from the past 240 hour(s))  Blood Culture (routine x 2)     Status: None (Preliminary result)   Collection Time: 10/12/17 10:00 AM  Result Value Ref Range Status   Specimen Description BLOOD RIGHT ANTECUBITAL DRAWN BY RN  Final   Special Requests   Final    BOTTLES DRAWN AEROBIC AND ANAEROBIC Blood Culture adequate volume   Culture NO GROWTH 4 DAYS  Final   Report Status PENDING  Incomplete  Blood Culture (routine x 2)     Status: None (Preliminary result)   Collection Time: 10/12/17 10:00 AM  Result Value Ref Range Status   Specimen Description   Final    BLOOD RIGHT HAND BOTTLES DRAWN  AEROBIC AND ANAEROBIC   Special Requests Blood Culture adequate volume  Final  Culture NO GROWTH 4 DAYS  Final   Report Status PENDING  Incomplete  MRSA PCR Screening     Status: None   Collection Time: 10/12/17  3:13 PM  Result Value Ref Range Status   MRSA by PCR NEGATIVE NEGATIVE Final    Comment:        The GeneXpert MRSA Assay (FDA approved for NASAL specimens only), is one component of a comprehensive MRSA colonization surveillance program. It is not intended to diagnose MRSA infection nor to guide or monitor treatment for MRSA infections.          Radiology Studies: Dg Chest Port 1 View  Result Date: 10/15/2017 CLINICAL DATA:  Hypoxia and shortness of breath EXAM: PORTABLE CHEST 1 VIEW COMPARISON:  Chest radiograph 10/12/2017 FINDINGS: Shallow lung inflation. Unchanged cardiomegaly. There is right parahilar consolidation with bilateral pulmonary vascular congestion. No pneumothorax or sizable pleural effusion. IMPRESSION: 1. Right parahilar consolidation may indicate developing pneumonia. Followup PA and lateral chest X-ray is recommended in 3-4 weeks following trial of antibiotic therapy to ensure resolution and exclude underlying malignancy. 2. Cardiomegaly and pulmonary vascular congestion. Electronically Signed   By: Ulyses Jarred M.D.   On: 10/15/2017 22:27        Scheduled Meds: . abacavir  600 mg Oral Daily   And  . lamiVUDine  300 mg Oral Daily  . amoxicillin-clavulanate  1 tablet Oral Q12H  . atazanavir  400 mg Oral Q breakfast  . atorvastatin  10 mg Oral BID  . divalproex  500 mg Oral 2 times per day  . enoxaparin (LOVENOX) injection  40 mg Subcutaneous Q24H  . guaiFENesin  1,200 mg Oral BID  . haloperidol  20 mg Oral TID  . hydrOXYzine  25 mg Oral QHS  . ipratropium-albuterol  3 mL Nebulization Q6H  . magnesium oxide  400 mg Oral Daily  . mouth rinse  15 mL Mouth Rinse BID  . predniSONE  40 mg Oral BID WC  . QUEtiapine  100 mg Oral 2 times per  day  . raltegravir  400 mg Oral BID  . sulfamethoxazole-trimethoprim  2 tablet Oral TID  . trihexyphenidyl  2 mg Oral BID WC  . valACYclovir  1,000 mg Oral Daily  . zolpidem  5 mg Oral QHS   Continuous Infusions:   LOS: 4 days    Time spent: 25 minutes. Greater than 50% of this time was spent in direct contact with the patient coordinating care.     Lelon Frohlich, MD [facility] Pager [phone]  If 7PM-7AM, please contact night-coverage [email or web address] Password [facility] 10/16/2017, 4:32 PM

## 2017-10-17 ENCOUNTER — Inpatient Hospital Stay (HOSPITAL_COMMUNITY): Payer: Medicaid Other

## 2017-10-17 DIAGNOSIS — I361 Nonrheumatic tricuspid (valve) insufficiency: Secondary | ICD-10-CM

## 2017-10-17 LAB — CULTURE, BLOOD (ROUTINE X 2)
CULTURE: NO GROWTH
CULTURE: NO GROWTH
SPECIAL REQUESTS: ADEQUATE
SPECIAL REQUESTS: ADEQUATE

## 2017-10-17 MED ORDER — FUROSEMIDE 10 MG/ML IJ SOLN
40.0000 mg | Freq: Once | INTRAMUSCULAR | Status: AC
  Administered 2017-10-17: 40 mg via INTRAVENOUS
  Filled 2017-10-17: qty 4

## 2017-10-17 MED ORDER — IOPAMIDOL (ISOVUE-370) INJECTION 76%
100.0000 mL | Freq: Once | INTRAVENOUS | Status: AC | PRN
  Administered 2017-10-17: 100 mL via INTRAVENOUS

## 2017-10-17 NOTE — Progress Notes (Signed)
PROGRESS NOTE    Karina Clarke  WUJ:[id number] DOB: 1961/03/07 DOA: 10/12/2017 PCP: Rosita Fire, MD     Brief Narrative:  56 year old woman admitted from a group home on 11/18 due to shortness of breath.  Found to have pneumonia.  Still remains with very high oxygen requirements.  Past medical history significant for schizophrenia and HIV/AIDS.   Assessment & Plan:   Active Problems:   Sepsis ([place])   Tobacco use disorder   HIV (human immunodeficiency virus infection) ([place])   Schizophrenia ([place])   HCAP (healthcare-associated pneumonia)   Acute respiratory failure with hypoxia ([place])   AIDS ([place])   Acute hypoxemic respiratory failure -I believe this is due to community-acquired pneumonia as well as possibly a component of mild pulmonary edema from aggressive volume resuscitation. -Does not have a history of CHF, echo has been done but has not been read. -Still requiring about 14 L of oxygen via high flow nasal cannula. -We will give extra dose of IV Lasix 40 mg today. -Wheezing appears improved, will continue nebulizer treatments.  Continue prednisone. -Since she remains so hypoxic and has no baseline oxygen requirement, will obtain CT chest PE protocol to rule out PE as well as to investigate other causes of hypoxemia. -We will request pulmonary consultation to see if any additional recommendations. -Please see below for treatment of pneumonia.  Community-acquired pneumonia -Was initially started on vancomycin and cefepime, MRSA PCR was negative and vancomycin was subsequently discontinued. -She was transitioned over to Augmentin on 11/19 with plans to complete a total of 7 days of antibiotics. -Due to increased oxygen requirements and immunocompromised state with HIV/AIDS, patient's case was discussed with Dr. Johnnye Sima with infectious diseases.  We have decided to cover her for potential PCP pneumonia as well. -Continue steroid taper as well as high-dose Bactrim. -PCP smear  has been requested.  HIV/AIDS -Last known CD4 count in September 2018 was 150. -Continue current antiretroviral therapy and follow-up with infectious disease specialist at [facility].  Schizophrenia -Mood stable. -Continue psychotropic medications.   DVT prophylaxis: Lovenox Code Status: Full code Family Communication: Patient only Disposition Plan: Back to group home once medically stable  Consultants:   Pulmonary, pending  Infectious diseases, telephone  Procedures:   Echo, pending  Antimicrobials:  Anti-infectives (From admission, onward)   Start     Dose/Rate Route Frequency Ordered Stop   10/15/17 1600  sulfamethoxazole-trimethoprim (BACTRIM DS,SEPTRA DS) 800-160 MG per tablet 3 tablet  Status:  Discontinued     3 tablet Oral 3 times daily 10/15/17 1008 10/15/17 1017   10/15/17 1600  sulfamethoxazole-trimethoprim (BACTRIM DS,SEPTRA DS) 800-160 MG per tablet 2 tablet     2 tablet Oral 3 times daily 10/15/17 1017     10/15/17 1030  sulfamethoxazole-trimethoprim (BACTRIM DS,SEPTRA DS) 800-160 MG per tablet 1 tablet     1 tablet Oral  Once 10/15/17 1018 10/15/17 1045   10/14/17 1215  sulfamethoxazole-trimethoprim (BACTRIM DS,SEPTRA DS) 800-160 MG per tablet 1 tablet  Status:  Discontinued     1 tablet Oral Every 12 hours 10/14/17 1214 10/15/17 1008   10/13/17 2200  amoxicillin-clavulanate (AUGMENTIN) 875-125 MG per tablet 1 tablet     1 tablet Oral Every 12 hours 10/13/17 1750     10/13/17 1000  abacavir-lamiVUDine (EPZICOM) 600-300 MG per tablet 1 tablet  Status:  Discontinued     1 tablet Oral Daily 10/12/17 1634 10/13/17 0824   10/13/17 1000  valACYclovir (VALTREX) tablet 1,000 mg     1,000 mg  Oral Daily 10/12/17 1634     10/13/17 1000  abacavir (ZIAGEN) tablet 600 mg     600 mg Oral Daily 10/13/17 0824     10/13/17 1000  lamiVUDine (EPIVIR) tablet 300 mg     300 mg Oral Daily 10/13/17 0824     10/13/17 0800  atazanavir (REYATAZ) capsule 400 mg     400 mg  Oral Daily with breakfast 10/12/17 1634     10/13/17 0100  ceFEPIme (MAXIPIME) 1 g in dextrose 5 % 50 mL IVPB  Status:  Discontinued     1 g 100 mL/hr over 30 Minutes Intravenous Every 8 hours 10/12/17 1732 10/13/17 1750   10/12/17 2200  vancomycin (VANCOCIN) IVPB 750 mg/150 ml premix  Status:  Discontinued     750 mg 150 mL/hr over 60 Minutes Intravenous Every 12 hours 10/12/17 1258 10/12/17 1634   10/12/17 2200  raltegravir (ISENTRESS) tablet 400 mg     400 mg Oral 2 times daily 10/12/17 1634     10/12/17 1700  piperacillin-tazobactam (ZOSYN) IVPB 3.375 g  Status:  Discontinued     3.375 g 12.5 mL/hr over 240 Minutes Intravenous Every 8 hours 10/12/17 1258 10/12/17 1634   10/12/17 1700  ceFEPIme (MAXIPIME) 2 g in dextrose 5 % 50 mL IVPB     2 g 100 mL/hr over 30 Minutes Intravenous  Once 10/12/17 1634 10/12/17 1733   10/12/17 1645  vancomycin (VANCOCIN) IVPB 1000 mg/200 mL premix  Status:  Discontinued     1,000 mg 200 mL/hr over 60 Minutes Intravenous  Once 10/12/17 1634 10/12/17 1657   10/12/17 1230  vancomycin (VANCOCIN) IVPB 1000 mg/200 mL premix     1,000 mg 200 mL/hr over 60 Minutes Intravenous  Once 10/12/17 1229 10/12/17 1404   10/12/17 1030  piperacillin-tazobactam (ZOSYN) IVPB 3.375 g     3.375 g 100 mL/hr over 30 Minutes Intravenous  Once 10/12/17 1025 10/12/17 1113   10/12/17 1015  sulfamethoxazole-trimethoprim (BACTRIM DS,SEPTRA DS) 800-160 MG per tablet 1 tablet     1 tablet Oral  Once 10/12/17 1003 10/12/17 1020   10/12/17 1000  cefTRIAXone (ROCEPHIN) 1 g in dextrose 5 % 50 mL IVPB  Status:  Discontinued     1 g 100 mL/hr over 30 Minutes Intravenous  Once 10/12/17 0951 10/12/17 0952   10/12/17 1000  azithromycin (ZITHROMAX) 500 mg in dextrose 5 % 250 mL IVPB  Status:  Discontinued     500 mg 250 mL/hr over 60 Minutes Intravenous  Once 10/12/17 0951 10/12/17 0952   10/12/17 1000  vancomycin (VANCOCIN) IVPB 1000 mg/200 mL premix     1,000 mg 200 mL/hr over 60 Minutes  Intravenous  Once 10/12/17 0954 10/12/17 1139   10/12/17 1000  piperacillin-tazobactam (ZOSYN) IVPB 2.25 g  Status:  Discontinued     2.25 g 100 mL/hr over 30 Minutes Intravenous  Once 10/12/17 0954 10/12/17 1025       Subjective: Lying in bed, states she feels "terrible", still short of breath  Objective: Vitals:   10/17/17 0653 10/17/17 0801 10/17/17 1430 10/17/17 1529  BP: 112/60  120/66   Pulse: 64  69   Resp: 20  19   Temp: 98.2 F (36.8 C)  98.6 F (37 C)   TempSrc: Oral  Oral   SpO2: 93% 94% 95% 94%  Weight:      Height:        Intake/Output Summary (Last 24 hours) at 10/17/2017 1605 Last data filed at 10/17/2017  1500 Gross per 24 hour  Intake 480 ml  Output 2950 ml  Net -2470 ml   Filed Weights   10/12/17 1200 10/12/17 1506  Weight: 99.8 kg (220 lb) 108.3 kg (238 lb 12.1 oz)    Examination:  General exam: Alert, awake, oriented x 3 Respiratory system: Mild bilateral crackles and wheezing Cardiovascular system:RRR. No murmurs, rubs, gallops. Gastrointestinal system: Abdomen is nondistended, soft and nontender. No organomegaly or masses felt. Normal bowel sounds heard. Central nervous system: Alert and oriented. No focal neurological deficits. Extremities: No C/C/E, +pedal pulses Skin: No rashes, lesions or ulcers Psychiatry: Flat affect    Data Reviewed: I have personally reviewed following labs and imaging studies  CBC: Recent Labs  Lab 10/12/17 0958 10/13/17 0620 10/16/17 0439  WBC 6.4 5.3 5.3  NEUTROABS 2.5  --   --   HGB 13.9 11.6* 11.3*  HCT 43.6 37.1 37.5  MCV 96.9 97.6 100.8*  PLT 154 129* 937*   Basic Metabolic Panel: Recent Labs  Lab 10/12/17 0958 10/13/17 0620 10/16/17 0439  NA 140 139 138  K 3.6 4.3 5.0  CL 103 108 104  CO2 27 26 29   GLUCOSE 112* 151* 163*  BUN 18 13 14   CREATININE 1.25* 0.87 0.91  CALCIUM 9.5 8.1* 8.5*   GFR: Estimated Creatinine Clearance: 95.7 mL/min (by C-G formula based on SCr of 0.91  mg/dL). Liver Function Tests: Recent Labs  Lab 10/12/17 0958 10/13/17 0620  AST 27 22  ALT 22 22  ALKPHOS 108 72  BILITOT 0.3 0.3  PROT 8.1 6.4*  ALBUMIN 4.2 3.1*   No results for input(s): LIPASE, AMYLASE in the last 168 hours. No results for input(s): AMMONIA in the last 168 hours. Coagulation Profile: Recent Labs  Lab 10/12/17 1833  INR 1.01   Cardiac Enzymes: No results for input(s): CKTOTAL, CKMB, CKMBINDEX, TROPONINI in the last 168 hours. BNP (last 3 results) No results for input(s): PROBNP in the last 8760 hours. HbA1C: No results for input(s): HGBA1C in the last 72 hours. CBG: No results for input(s): GLUCAP in the last 168 hours. Lipid Profile: No results for input(s): CHOL, HDL, LDLCALC, TRIG, CHOLHDL, LDLDIRECT in the last 72 hours. Thyroid Function Tests: No results for input(s): TSH, T4TOTAL, FREET4, T3FREE, THYROIDAB in the last 72 hours. Anemia Panel: No results for input(s): VITAMINB12, FOLATE, FERRITIN, TIBC, IRON, RETICCTPCT in the last 72 hours. Urine analysis:    Component Value Date/Time   COLORURINE YELLOW 10/12/2017 [place] 10/12/2017 0944   LABSPEC 1.017 10/12/2017 0944   PHURINE 5.0 10/12/2017 [place] 10/12/2017 0944   HGBUR NEGATIVE 10/12/2017 [place] 10/12/2017 [place] 10/12/2017 0944   PROTEINUR NEGATIVE 10/12/2017 0944   NITRITE NEGATIVE 10/12/2017 0944   LEUKOCYTESUR NEGATIVE 10/12/2017 0944   Sepsis Labs: @LABRCNTIP (procalcitonin:4,lacticidven:4)  ) Recent Results (from the past 240 hour(s))  Blood Culture (routine x 2)     Status: None   Collection Time: 10/12/17 10:00 AM  Result Value Ref Range Status   Specimen Description BLOOD RIGHT ANTECUBITAL DRAWN BY RN  Final   Special Requests   Final    BOTTLES DRAWN AEROBIC AND ANAEROBIC Blood Culture adequate volume   Culture NO GROWTH 5 DAYS  Final   Report Status 10/17/2017 FINAL  Final  Blood Culture  (routine x 2)     Status: None   Collection Time: 10/12/17 10:00 AM  Result Value Ref Range Status  Specimen Description   Final    BLOOD RIGHT HAND BOTTLES DRAWN AEROBIC AND ANAEROBIC   Special Requests Blood Culture adequate volume  Final   Culture NO GROWTH 5 DAYS  Final   Report Status 10/17/2017 FINAL  Final  MRSA PCR Screening     Status: None   Collection Time: 10/12/17  3:13 PM  Result Value Ref Range Status   MRSA by PCR NEGATIVE NEGATIVE Final    Comment:        The GeneXpert MRSA Assay (FDA approved for NASAL specimens only), is one component of a comprehensive MRSA colonization surveillance program. It is not intended to diagnose MRSA infection nor to guide or monitor treatment for MRSA infections.          Radiology Studies: Dg Chest Port 1 View  Result Date: 10/15/2017 CLINICAL DATA:  Hypoxia and shortness of breath EXAM: PORTABLE CHEST 1 VIEW COMPARISON:  Chest radiograph 10/12/2017 FINDINGS: Shallow lung inflation. Unchanged cardiomegaly. There is right parahilar consolidation with bilateral pulmonary vascular congestion. No pneumothorax or sizable pleural effusion. IMPRESSION: 1. Right parahilar consolidation may indicate developing pneumonia. Followup PA and lateral chest X-ray is recommended in 3-4 weeks following trial of antibiotic therapy to ensure resolution and exclude underlying malignancy. 2. Cardiomegaly and pulmonary vascular congestion. Electronically Signed   By: Ulyses Jarred M.D.   On: 10/15/2017 22:27        Scheduled Meds: . abacavir  600 mg Oral Daily   And  . lamiVUDine  300 mg Oral Daily  . amoxicillin-clavulanate  1 tablet Oral Q12H  . atazanavir  400 mg Oral Q breakfast  . atorvastatin  10 mg Oral BID  . divalproex  500 mg Oral 2 times per day  . enoxaparin (LOVENOX) injection  40 mg Subcutaneous Q24H  . guaiFENesin  1,200 mg Oral BID  . haloperidol  20 mg Oral TID  . hydrOXYzine  25 mg Oral QHS  . ipratropium-albuterol  3 mL  Nebulization Q6H  . magnesium oxide  400 mg Oral Daily  . mouth rinse  15 mL Mouth Rinse BID  . predniSONE  40 mg Oral BID WC  . QUEtiapine  100 mg Oral 2 times per day  . raltegravir  400 mg Oral BID  . sulfamethoxazole-trimethoprim  2 tablet Oral TID  . trihexyphenidyl  2 mg Oral BID WC  . valACYclovir  1,000 mg Oral Daily  . zolpidem  5 mg Oral QHS   Continuous Infusions:   LOS: 5 days    Time spent: 25 minutes. Greater than 50% of this time was spent in direct contact with the patient coordinating care.     Lelon Frohlich, MD [facility] Pager [phone]  If 7PM-7AM, please contact night-coverage [email or web address] Password [facility] 10/17/2017, 4:05 PM

## 2017-10-17 NOTE — Progress Notes (Signed)
*  PRELIMINARY RESULTS* Echocardiogram 2D Echocardiogram has been performed.  Karina Clarke 10/17/2017, 3:43 PM

## 2017-10-18 LAB — BASIC METABOLIC PANEL
Anion gap: 10 (ref 5–15)
BUN: 27 mg/dL — ABNORMAL HIGH (ref 6–20)
CO2: 30 mmol/L (ref 22–32)
Calcium: 9.1 mg/dL (ref 8.9–10.3)
Chloride: 97 mmol/L — ABNORMAL LOW (ref 101–111)
Creatinine, Ser: 1.17 mg/dL — ABNORMAL HIGH (ref 0.44–1.00)
GFR calc Af Amer: >60 mL/min (ref >60)
GFR, EST NON AFRICAN AMERICAN: 52 mL/min — AB (ref >60)
GLUCOSE: 253 mg/dL — AB (ref 65–99)
POTASSIUM: 4.6 mmol/L (ref 3.5–5.1)
Sodium: 137 mmol/L (ref 135–145)

## 2017-10-18 LAB — CBC
HEMATOCRIT: 39.2 % (ref 36.0–46.0)
HEMOGLOBIN: 12.1 g/dL (ref 12.0–15.0)
MCH: 30.4 pg (ref 26.0–34.0)
MCHC: 30.9 g/dL (ref 30.0–36.0)
MCV: 98.5 fL (ref 78.0–100.0)
Platelets: 165 10*3/uL (ref 150–400)
RBC: 3.98 MIL/uL (ref 3.87–5.11)
RDW: 15.6 % — ABNORMAL HIGH (ref 11.5–15.5)
WBC: 7.1 10*3/uL (ref 4.0–10.5)

## 2017-10-18 LAB — ECHOCARDIOGRAM COMPLETE
Height: 71 in
Weight: 3820.13 oz

## 2017-10-18 MED ORDER — PIPERACILLIN-TAZOBACTAM 3.375 G IVPB
3.3750 g | Freq: Three times a day (TID) | INTRAVENOUS | Status: AC
  Administered 2017-10-18 – 2017-10-22 (×12): 3.375 g via INTRAVENOUS
  Filled 2017-10-18 (×12): qty 50

## 2017-10-18 MED ORDER — METHYLPREDNISOLONE SODIUM SUCC 40 MG IJ SOLR
40.0000 mg | Freq: Four times a day (QID) | INTRAMUSCULAR | Status: DC
  Administered 2017-10-18 – 2017-10-24 (×25): 40 mg via INTRAVENOUS
  Filled 2017-10-18 (×27): qty 1

## 2017-10-18 NOTE — Progress Notes (Signed)
Pharmacy Antibiotic Note  Karina Clarke is a 56 y.o. female admitted on 10/12/2017 with aspiration pneumonia.  Pharmacy has been consulted for ZOSYN dosing.  Plan: Zosyn 3.375gm IV q8h, EID Monitor labs, progress, c/s  Height: 5\' 11"  (180.3 cm) Weight: 238 lb 12.1 oz (108.3 kg) IBW/kg (Calculated) : 70.8  Temp (24hrs), Avg:98.8 F (37.1 C), Min:98.6 F (37 C), Max:99.2 F (37.3 C)  Recent Labs  Lab 10/12/17 0958 10/12/17 1011 10/12/17 1215 10/13/17 0620 10/16/17 0439 10/18/17 0501  WBC 6.4  --   --  5.3 5.3 7.1  CREATININE 1.25*  --   --  0.87 0.91 1.17*  LATICACIDVEN  --  1.97* 1.68  --   --   --     Estimated Creatinine Clearance: 74.5 mL/min (A) (by C-G formula based on SCr of 1.17 mg/dL (H)).    Allergies  Allergen Reactions  . Asa [Aspirin] Nausea And Vomiting   Antimicrobials this admission: Zosyn 11/24 >>   Dose adjustments this admission:  Microbiology results:  BCx: pending  UCx: pending   Sputum:    MRSA PCR:   Thank you for allowing pharmacy to be a part of this patient's care.  [name] 10/18/2017 1:11 PM

## 2017-10-18 NOTE — Consult Note (Signed)
Consult requested by: [facility] Dr. Jerilee Hoh Consult requested for: Healthcare associated pneumonia/acute hypoxic respiratory failure  HPI: This is a 56 year old with significant past medical history including HIV/AIDS, schizophrenia, intellectual disability, history of breast hyperplasia hypertension, significant smoking history but no defined diagnosis of COPD who came to the emergency department with increasing shortness of breath she has had cough and wheezing has been coughing up green sputum and had fever.  She initially was started on cefepime and vancomycin for healthcare associated pneumonia because she lives in a group home but her antibiotics were later tapered to Augmentin.  However she has continued having shortness of breath and significant hypoxia requiring high flow nasal oxygen.  CT done yesterday shows multilobar pneumonia.  She denies chest pain.  She denies abdominal pain nausea or vomiting.  History is limited by her mental illness Past Medical History:  Diagnosis Date  . 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 Bloating    per sister  . Chronic right hip pain    sister states circulation problem in hip  . Family history of adverse reaction to anesthesia    sister has hx. of post-op N/V  . High cholesterol   . HIV (human immunodeficiency virus infection) ([place])   . Hypertension   . Limp   . Mild intellectual disability   . No natural teeth    does not wear her dentures  . Paranoid schizophrenia ([place])   . Personality disorder ([place])   . Schizophrenia ([place])      Family History  Problem Relation Age of Onset  . Anesthesia problems Sister        post-op N/V     Social History   Socioeconomic History  . Marital status: Single    Spouse name: None  . Number of children: None  . Years of education: None  . Highest education level: None  Social Needs  . Financial resource strain: None  . Food insecurity - worry: None  . Food insecurity -  inability: None  . Transportation needs - medical: None  . Transportation needs - non-medical: None  Occupational History  . Occupation: disabled  Tobacco Use  . Smoking status: Current Every Day Smoker    Packs/day: 1.00    Years: 18.00    Pack years: 18.00    Types: Cigarettes  . Smokeless tobacco: Never Used  Substance and Sexual Activity  . Alcohol use: No    Alcohol/week: 0.0 oz  . Drug use: No  . Sexual activity: No  Other Topics Concern  . None  Social History Narrative  . None     ROS: Limited by mental illness    Objective: Vital signs in last 24 hours: Temp:  [98.6 F (37 C)-99.2 F (37.3 C)] 99.2 F (37.3 C) (11/24 0351) Pulse Rate:  [69-74] 72 (11/24 0351) Resp:  [18-19] 18 (11/24 0351) BP: (120-137)/(66-84) 137/84 (11/24 0351) SpO2:  [80 %-100 %] 83 % (11/24 0755) Weight change:  Last BM Date: 10/14/17  Intake/Output from previous day: 11/23 0701 - 11/24 0700 In: 600 [P.O.:600] Out: 3250 [Urine:3250]  PHYSICAL EXAM Constitutional: She is awake sleepy lying flat in the bed.  Eyes: Pupils react.  EOMI.  Ears nose mouth and throat: Her mucous membranes are moist.  Her throat is clear.  Hearing is grossly normal.  Cardiovascular: Her heart is regular with normal heart sounds.  Respiratory: She has bilateral rales rhonchi and wheezing.  Respiratory effort somewhat increased.  Gastrointestinal: Her abdomen is  soft obese with no masses.  Skin: Normal skin turgor.  Musculoskeletal: She has normal strength in the extremities to cursory exam.  Neurological: No focal abnormalities.  Psychiatric: She has schizophrenia and has a very flat affect  Lab Results: Basic Metabolic Panel: Recent Labs    10/16/17 0439 10/18/17 0501  NA 138 137  K 5.0 4.6  CL 104 97*  CO2 29 30  GLUCOSE 163* 253*  BUN 14 27*  CREATININE 0.91 1.17*  CALCIUM 8.5* 9.1   Liver Function Tests: No results for input(s): AST, ALT, ALKPHOS, BILITOT, PROT, ALBUMIN in the last 72  hours. No results for input(s): LIPASE, AMYLASE in the last 72 hours. No results for input(s): AMMONIA in the last 72 hours. CBC: Recent Labs    10/16/17 0439 10/18/17 0501  WBC 5.3 7.1  HGB 11.3* 12.1  HCT 37.5 39.2  MCV 100.8* 98.5  PLT 136* 165   Cardiac Enzymes: No results for input(s): CKTOTAL, CKMB, CKMBINDEX, TROPONINI in the last 72 hours. BNP: No results for input(s): PROBNP in the last 72 hours. D-Dimer: No results for input(s): DDIMER in the last 72 hours. CBG: No results for input(s): GLUCAP in the last 72 hours. Hemoglobin A1C: No results for input(s): HGBA1C in the last 72 hours. Fasting Lipid Panel: No results for input(s): CHOL, HDL, LDLCALC, TRIG, CHOLHDL, LDLDIRECT in the last 72 hours. Thyroid Function Tests: No results for input(s): TSH, T4TOTAL, FREET4, T3FREE, THYROIDAB in the last 72 hours. Anemia Panel: No results for input(s): VITAMINB12, FOLATE, FERRITIN, TIBC, IRON, RETICCTPCT in the last 72 hours. Coagulation: No results for input(s): LABPROT, INR in the last 72 hours. Urine Drug Screen: Drugs of Abuse  No results found for: LABOPIA, COCAINSCRNUR, LABBENZ, AMPHETMU, THCU, LABBARB  Alcohol Level: No results for input(s): ETH in the last 72 hours. Urinalysis: No results for input(s): COLORURINE, LABSPEC, PHURINE, GLUCOSEU, HGBUR, BILIRUBINUR, KETONESUR, PROTEINUR, UROBILINOGEN, NITRITE, LEUKOCYTESUR in the last 72 hours.  Invalid input(s): APPERANCEUR Misc. Labs:   ABGS: No results for input(s): PHART, PO2ART, TCO2, HCO3 in the last 72 hours.  Invalid input(s): PCO2   MICROBIOLOGY: Recent Results (from the past 240 hour(s))  Blood Culture (routine x 2)     Status: None   Collection Time: 10/12/17 10:00 AM  Result Value Ref Range Status   Specimen Description BLOOD RIGHT ANTECUBITAL DRAWN BY RN  Final   Special Requests   Final    BOTTLES DRAWN AEROBIC AND ANAEROBIC Blood Culture adequate volume   Culture NO GROWTH 5 DAYS  Final    Report Status 10/17/2017 FINAL  Final  Blood Culture (routine x 2)     Status: None   Collection Time: 10/12/17 10:00 AM  Result Value Ref Range Status   Specimen Description   Final    BLOOD RIGHT HAND BOTTLES DRAWN AEROBIC AND ANAEROBIC   Special Requests Blood Culture adequate volume  Final   Culture NO GROWTH 5 DAYS  Final   Report Status 10/17/2017 FINAL  Final  MRSA PCR Screening     Status: None   Collection Time: 10/12/17  3:13 PM  Result Value Ref Range Status   MRSA by PCR NEGATIVE NEGATIVE Final    Comment:        The GeneXpert MRSA Assay (FDA approved for NASAL specimens only), is one component of a comprehensive MRSA colonization surveillance program. It is not intended to diagnose MRSA infection nor to guide or monitor treatment for MRSA infections.     Studies/Results: Ct  Angio Chest Pe W Or Wo Contrast  Result Date: 10/17/2017 CLINICAL DATA:  Shortness of breath. EXAM: CT ANGIOGRAPHY CHEST WITH CONTRAST TECHNIQUE: Multidetector CT imaging of the chest was performed using the standard protocol during bolus administration of intravenous contrast. Multiplanar CT image reconstructions and MIPs were obtained to evaluate the vascular anatomy. CONTRAST:  170mL ISOVUE-370 IOPAMIDOL (ISOVUE-370) INJECTION 76% COMPARISON:  Radiograph 10/15/2017. Report from chest CT 09/07/2011, images not available. FINDINGS: Cardiovascular: Dilatation of the main pulmonary artery of 3.8 cm, and particularly the right and left main pulmonary arteries measuring 3.6 cm respectively. There are no intraluminal filling defects to the segmental level, subsegmental evaluation is limited due to poor distal perfusion. Multi chamber cardiomegaly with prominent right heart dilatation. Normal caliber abdominal aorta with minimal atherosclerosis. 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 No pericardial effusion. Mediastinum/Nodes: Small mediastinal nodes, largest 7 mm upper  paratracheal. There is a calcified paraesophageal node adjacent to the mid-distal esophagus. No hilar adenopathy. The esophagus is decompressed. Lungs/Pleura: Small dependent consolidations in both lower lobes. Additional consolidation in the dependent right upper lobe with adjacent patchy ground-glass opacity. Scattered atelectasis in both lungs. No pleural fluid. Trachea and mainstem bronchi are patent, there is mild patient motion artifact. Upper Abdomen: Ovoid 3mm soft tissue density adjacent to the splenic hilum and pancreatic tail may be a lymph node or splenule. Musculoskeletal: There are no acute or suspicious osseous abnormalities. Review of the MIP images confirms the above findings. IMPRESSION: 1. No pulmonary embolus in the segmental level. 2. Significantly dilated main pulmonary arteries consistent with pulmonary arterial hypertension. 3. Dependent consolidations within both lower lobes and the right upper lobe, pneumonia versus aspiration. Adjacent right upper lobe ground-glass opacities likely part of same process, less likely superimposed pulmonary edema. 4. 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 Aortic Atherosclerosis (ICD10-I70.0). Electronically Signed   By: Jeb Levering M.D.   On: 10/17/2017 20:56    Medications:  Prior to Admission:  Medications Prior to Admission  Medication Sig Dispense Refill Last Dose  . abacavir-lamiVUDine (EPZICOM) 600-300 MG per tablet Take 1 tablet by mouth daily.   10/12/2017 at Unknown time  . albuterol (PROVENTIL HFA;VENTOLIN HFA) 108 (90 Base) MCG/ACT inhaler Inhale 1-2 puffs into the lungs 2 (two) times daily as needed for wheezing or shortness of breath.   10/12/2017 at Unknown time  . atazanavir (REYATAZ) 200 MG capsule Take 400 mg by mouth daily.    10/12/2017 at Unknown time  . atorvastatin (LIPITOR) 10 MG tablet Take 10 mg by mouth 2 (two) times daily.   10/12/2017 at Unknown time  . divalproex (DEPAKOTE) 500 MG DR  tablet Take 500 mg by mouth See admin instructions. Take 1 tablet (500 mg) by mouth at 4pm and 8pm daily   10/11/2017 at Unknown time  . haloperidol (HALDOL) 10 MG tablet Take 20 mg by mouth 3 (three) times daily.    10/11/2017 at Unknown time  . hydrOXYzine (VISTARIL) 25 MG capsule Take 25 mg by mouth at bedtime.    10/12/2017 at Unknown time  . Magnesium Oxide 400 MG CAPS Take 1 capsule (400 mg total) by mouth every morning. 60 each 0 10/12/2017 at Unknown time  . polyethylene glycol (MIRALAX / GLYCOLAX) packet Take 17 g by mouth daily as needed.   unknown at unknown  . QUEtiapine (SEROQUEL) 200 MG tablet Take 100 mg by mouth See admin instructions. Take 1 tablet (200 mg) by mouth with supper and at bedtime daily  10/12/2017 at Unknown time  . raltegravir (ISENTRESS) 400 MG tablet Take 400 mg by mouth 2 (two) times daily.   10/12/2017 at Unknown time  . valACYclovir (VALTREX) 1000 MG tablet Take 1,000 mg by mouth daily.    10/12/2017 at Unknown time  . zolpidem (AMBIEN) 10 MG tablet Take 5 mg by mouth at bedtime.    10/11/2017 at Unknown time  . haloperidol decanoate (HALDOL DECANOATE) 100 MG/ML injection Inject 200 mg into the muscle every 28 (twenty-eight) days. Last injection 06/09/15    Not Taking at Unknown time  . trihexyphenidyl (ARTANE) 2 MG tablet Take 2 mg by mouth 2 (two) times daily with a meal.   Taking   Scheduled: . abacavir  600 mg Oral Daily   And  . lamiVUDine  300 mg Oral Daily  . amoxicillin-clavulanate  1 tablet Oral Q12H  . atazanavir  400 mg Oral Q breakfast  . atorvastatin  10 mg Oral BID  . divalproex  500 mg Oral 2 times per day  . enoxaparin (LOVENOX) injection  40 mg Subcutaneous Q24H  . guaiFENesin  1,200 mg Oral BID  . haloperidol  20 mg Oral TID  . hydrOXYzine  25 mg Oral QHS  . ipratropium-albuterol  3 mL Nebulization Q6H  . magnesium oxide  400 mg Oral Daily  . mouth rinse  15 mL Mouth Rinse BID  . predniSONE  40 mg Oral BID WC  . QUEtiapine  100 mg Oral  2 times per day  . raltegravir  400 mg Oral BID  . sulfamethoxazole-trimethoprim  2 tablet Oral TID  . trihexyphenidyl  2 mg Oral BID WC  . valACYclovir  1,000 mg Oral Daily  . zolpidem  5 mg Oral QHS   Continuous:  SJG:[id number] **OR** acetaminophen, albuterol, ibuprofen, ondansetron **OR** ondansetron (ZOFRAN) IV, polyethylene glycol  Assesment: She is admitted with initially with healthcare associated pneumonia.  She is currently on Augmentin.  She is on prednisone basically for PCP.  She has AIDS and her CD4 count is 140 so she is at risk.  Her situation is complicated by her mental illness.  She is still markedly hypoxic which may simply be from multifocal pneumonia. Active Problems:   Sepsis ([place])   Tobacco use disorder   HIV (human immunodeficiency virus infection) ([place])   Schizophrenia ([place])   HCAP (healthcare-associated pneumonia)   Acute respiratory failure with hypoxia ([place])   AIDS ([place])    Plan: Discussed with Dr. Jerilee Hoh.  Plan to switch steroids to IV and change from Augmentin to Zosyn.  Continue Bactrim.    LOS: 6 days   Latresha Yahr L 10/18/2017, 10:13 AM

## 2017-10-18 NOTE — Plan of Care (Signed)
Pt is progressing 

## 2017-10-18 NOTE — Progress Notes (Signed)
PROGRESS NOTE    Karina Clarke  NLG:[id number] DOB: 21-Jul-1961 DOA: 10/12/2017 PCP: Rosita Fire, MD     Brief Narrative:  56 year old woman admitted from her group home on 11/18 due to dyspnea.  Found to have multifocal pneumonia.  Still remains with very high oxygen requirements.  Past medical history significant for HIV/AIDS, schizophrenia.   Assessment & Plan:   Active Problems:   Sepsis ([place])   Tobacco use disorder   HIV (human immunodeficiency virus infection) ([place])   Schizophrenia ([place])   HCAP (healthcare-associated pneumonia)   Acute respiratory failure with hypoxia ([place])   AIDS ([place])   Acute hypoxemic respiratory failure -Mainly due to community-acquired pneumonia, possibly a small component of mild pulmonary edema from aggressive initial volume resuscitation. -Echo with ejection fraction of 65-70% and grade 1 diastolic dysfunction, no wall motion abnormalities.  PA peak pressure of 38 mmHg. She is still requiring 14-15 L of oxygen delivered via high flow nasal cannula. --Has received 2 doses of Lasix daily times 3 days, believe no more required at this point. -CT chest is negative for PE when she does have dependent consolidation within both lower lobes and right upper lobe. -Influenza PCR screen has been negative. -Appreciate Dr. Luan Pulling input and recommendations, he has recommended changing steroids to p.o. and altering antibiotics to Zosyn to cover potential aspiration pathogens.  Community-acquired pneumonia -Initially started on vancomycin and cefepime and transitioned over to Augmentin on 11/19. -We have placed her on Zosyn on 11/24 to cover potential aspiration pathogens. -She is also being treated for PCP pneumonia given her age with a CD4 count of 150.  Continue Bactrim, steroids have been transitioned to IV route today. -PCP or has been requested.  HIV/AIDS -Last known CD4 count in September 2018 was 150. -Continue antiretroviral medications and  outpatient follow-up with ID.  Schizophrenia -Mood stable, continue psychotropic medications.   DVT prophylaxis: Lovenox Code Status: Full code Family Communication: Patient only Disposition Plan: Pending medical stability  Consultants:   Pulmonary, Dr. Luan Pulling  Procedures:   Echo as above  Antimicrobials:  Anti-infectives (From admission, onward)   Start     Dose/Rate Route Frequency Ordered Stop   10/18/17 1100  piperacillin-tazobactam (ZOSYN) IVPB 3.375 g     3.375 g 12.5 mL/hr over 240 Minutes Intravenous Every 8 hours 10/18/17 1039     10/15/17 1600  sulfamethoxazole-trimethoprim (BACTRIM DS,SEPTRA DS) 800-160 MG per tablet 3 tablet  Status:  Discontinued     3 tablet Oral 3 times daily 10/15/17 1008 10/15/17 1017   10/15/17 1600  sulfamethoxazole-trimethoprim (BACTRIM DS,SEPTRA DS) 800-160 MG per tablet 2 tablet     2 tablet Oral 3 times daily 10/15/17 1017     10/15/17 1030  sulfamethoxazole-trimethoprim (BACTRIM DS,SEPTRA DS) 800-160 MG per tablet 1 tablet     1 tablet Oral  Once 10/15/17 1018 10/15/17 1045   10/14/17 1215  sulfamethoxazole-trimethoprim (BACTRIM DS,SEPTRA DS) 800-160 MG per tablet 1 tablet  Status:  Discontinued     1 tablet Oral Every 12 hours 10/14/17 1214 10/15/17 1008   10/13/17 2200  amoxicillin-clavulanate (AUGMENTIN) 875-125 MG per tablet 1 tablet  Status:  Discontinued     1 tablet Oral Every 12 hours 10/13/17 1750 10/18/17 1032   10/13/17 1000  abacavir-lamiVUDine (EPZICOM) 600-300 MG per tablet 1 tablet  Status:  Discontinued     1 tablet Oral Daily 10/12/17 1634 10/13/17 0824   10/13/17 1000  valACYclovir (VALTREX) tablet 1,000 mg     1,000 mg Oral  Daily 10/12/17 1634     10/13/17 1000  abacavir (ZIAGEN) tablet 600 mg     600 mg Oral Daily 10/13/17 0824     10/13/17 1000  lamiVUDine (EPIVIR) tablet 300 mg     300 mg Oral Daily 10/13/17 0824     10/13/17 0800  atazanavir (REYATAZ) capsule 400 mg     400 mg Oral Daily with breakfast  10/12/17 1634     10/13/17 0100  ceFEPIme (MAXIPIME) 1 g in dextrose 5 % 50 mL IVPB  Status:  Discontinued     1 g 100 mL/hr over 30 Minutes Intravenous Every 8 hours 10/12/17 1732 10/13/17 1750   10/12/17 2200  vancomycin (VANCOCIN) IVPB 750 mg/150 ml premix  Status:  Discontinued     750 mg 150 mL/hr over 60 Minutes Intravenous Every 12 hours 10/12/17 1258 10/12/17 1634   10/12/17 2200  raltegravir (ISENTRESS) tablet 400 mg     400 mg Oral 2 times daily 10/12/17 1634     10/12/17 1700  piperacillin-tazobactam (ZOSYN) IVPB 3.375 g  Status:  Discontinued     3.375 g 12.5 mL/hr over 240 Minutes Intravenous Every 8 hours 10/12/17 1258 10/12/17 1634   10/12/17 1700  ceFEPIme (MAXIPIME) 2 g in dextrose 5 % 50 mL IVPB     2 g 100 mL/hr over 30 Minutes Intravenous  Once 10/12/17 1634 10/12/17 1733   10/12/17 1645  vancomycin (VANCOCIN) IVPB 1000 mg/200 mL premix  Status:  Discontinued     1,000 mg 200 mL/hr over 60 Minutes Intravenous  Once 10/12/17 1634 10/12/17 1657   10/12/17 1230  vancomycin (VANCOCIN) IVPB 1000 mg/200 mL premix     1,000 mg 200 mL/hr over 60 Minutes Intravenous  Once 10/12/17 1229 10/12/17 1404   10/12/17 1030  piperacillin-tazobactam (ZOSYN) IVPB 3.375 g     3.375 g 100 mL/hr over 30 Minutes Intravenous  Once 10/12/17 1025 10/12/17 1113   10/12/17 1015  sulfamethoxazole-trimethoprim (BACTRIM DS,SEPTRA DS) 800-160 MG per tablet 1 tablet     1 tablet Oral  Once 10/12/17 1003 10/12/17 1020   10/12/17 1000  cefTRIAXone (ROCEPHIN) 1 g in dextrose 5 % 50 mL IVPB  Status:  Discontinued     1 g 100 mL/hr over 30 Minutes Intravenous  Once 10/12/17 0951 10/12/17 0952   10/12/17 1000  azithromycin (ZITHROMAX) 500 mg in dextrose 5 % 250 mL IVPB  Status:  Discontinued     500 mg 250 mL/hr over 60 Minutes Intravenous  Once 10/12/17 0951 10/12/17 0952   10/12/17 1000  vancomycin (VANCOCIN) IVPB 1000 mg/200 mL premix     1,000 mg 200 mL/hr over 60 Minutes Intravenous  Once 10/12/17  0954 10/12/17 1139   10/12/17 1000  piperacillin-tazobactam (ZOSYN) IVPB 2.25 g  Status:  Discontinued     2.25 g 100 mL/hr over 30 Minutes Intravenous  Once 10/12/17 0954 10/12/17 1025       Subjective: States she does not feel well, her breathing is tight.  Objective: Vitals:   10/18/17 0351 10/18/17 0755 10/18/17 1451 10/18/17 1453  BP: 137/84  133/76   Pulse: 72  75   Resp: 18  19   Temp: 99.2 F (37.3 C)  98.9 F (37.2 C)   TempSrc: Oral  Oral   SpO2: 96% (!) 83% 91% 94%  Weight:      Height:        Intake/Output Summary (Last 24 hours) at 10/18/2017 1528 Last data filed at 10/18/2017 1300  Gross per 24 hour  Intake 360 ml  Output 3780 ml  Net -3420 ml   Filed Weights   10/12/17 1200 10/12/17 1506  Weight: 99.8 kg (220 lb) 108.3 kg (238 lb 12.1 oz)    Examination:  General exam: Alert, awake, oriented x 3, flat affect Respiratory system: Coarse bilateral breath sounds, expiratory wheezes Cardiovascular system:RRR. No murmurs, rubs, gallops. Gastrointestinal system: Abdomen is nondistended, soft and nontender. No organomegaly or masses felt. Normal bowel sounds heard. Central nervous system: Alert and oriented. No focal neurological deficits. Extremities: No C/C/E, +pedal pulses Skin: No rashes, lesions or ulcers Psychiatry: Judgement and insight appear normal. Mood & affect appropriate.     Data Reviewed: I have personally reviewed following labs and imaging studies  CBC: Recent Labs  Lab 10/12/17 0958 10/13/17 0620 10/16/17 0439 10/18/17 0501  WBC 6.4 5.3 5.3 7.1  NEUTROABS 2.5  --   --   --   HGB 13.9 11.6* 11.3* 12.1  HCT 43.6 37.1 37.5 39.2  MCV 96.9 97.6 100.8* 98.5  PLT 154 129* 136* 128   Basic Metabolic Panel: Recent Labs  Lab 10/12/17 0958 10/13/17 0620 10/16/17 0439 10/18/17 0501  NA 140 139 138 137  K 3.6 4.3 5.0 4.6  CL 103 108 104 97*  CO2 27 26 29 30   GLUCOSE 112* 151* 163* 253*  BUN 18 13 14  27*  CREATININE 1.25* 0.87  0.91 1.17*  CALCIUM 9.5 8.1* 8.5* 9.1   GFR: Estimated Creatinine Clearance: 74.5 mL/min (A) (by C-G formula based on SCr of 1.17 mg/dL (H)). Liver Function Tests: Recent Labs  Lab 10/12/17 0958 10/13/17 0620  AST 27 22  ALT 22 22  ALKPHOS 108 72  BILITOT 0.3 0.3  PROT 8.1 6.4*  ALBUMIN 4.2 3.1*   No results for input(s): LIPASE, AMYLASE in the last 168 hours. No results for input(s): AMMONIA in the last 168 hours. Coagulation Profile: Recent Labs  Lab 10/12/17 1833  INR 1.01   Cardiac Enzymes: No results for input(s): CKTOTAL, CKMB, CKMBINDEX, TROPONINI in the last 168 hours. BNP (last 3 results) No results for input(s): PROBNP in the last 8760 hours. HbA1C: No results for input(s): HGBA1C in the last 72 hours. CBG: No results for input(s): GLUCAP in the last 168 hours. Lipid Profile: No results for input(s): CHOL, HDL, LDLCALC, TRIG, CHOLHDL, LDLDIRECT in the last 72 hours. Thyroid Function Tests: No results for input(s): TSH, T4TOTAL, FREET4, T3FREE, THYROIDAB in the last 72 hours. Anemia Panel: No results for input(s): VITAMINB12, FOLATE, FERRITIN, TIBC, IRON, RETICCTPCT in the last 72 hours. Urine analysis:    Component Value Date/Time   COLORURINE YELLOW 10/12/2017 [place] 10/12/2017 0944   LABSPEC 1.017 10/12/2017 0944   PHURINE 5.0 10/12/2017 [place] 10/12/2017 0944   HGBUR NEGATIVE 10/12/2017 [place] 10/12/2017 [place] 10/12/2017 0944   PROTEINUR NEGATIVE 10/12/2017 0944   NITRITE NEGATIVE 10/12/2017 0944   LEUKOCYTESUR NEGATIVE 10/12/2017 0944   Sepsis Labs: @LABRCNTIP (procalcitonin:4,lacticidven:4)  ) Recent Results (from the past 240 hour(s))  Blood Culture (routine x 2)     Status: None   Collection Time: 10/12/17 10:00 AM  Result Value Ref Range Status   Specimen Description BLOOD RIGHT ANTECUBITAL DRAWN BY RN  Final   Special Requests   Final    BOTTLES DRAWN AEROBIC  AND ANAEROBIC Blood Culture adequate volume   Culture NO GROWTH 5 DAYS  Final   Report  Status 10/17/2017 FINAL  Final  Blood Culture (routine x 2)     Status: None   Collection Time: 10/12/17 10:00 AM  Result Value Ref Range Status   Specimen Description   Final    BLOOD RIGHT HAND BOTTLES DRAWN AEROBIC AND ANAEROBIC   Special Requests Blood Culture adequate volume  Final   Culture NO GROWTH 5 DAYS  Final   Report Status 10/17/2017 FINAL  Final  MRSA PCR Screening     Status: None   Collection Time: 10/12/17  3:13 PM  Result Value Ref Range Status   MRSA by PCR NEGATIVE NEGATIVE Final    Comment:        The GeneXpert MRSA Assay (FDA approved for NASAL specimens only), is one component of a comprehensive MRSA colonization surveillance program. It is not intended to diagnose MRSA infection nor to guide or monitor treatment for MRSA infections.          Radiology Studies: Ct Angio Chest Pe W Or Wo Contrast  Result Date: 10/17/2017 CLINICAL DATA:  Shortness of breath. EXAM: CT ANGIOGRAPHY CHEST WITH CONTRAST TECHNIQUE: Multidetector CT imaging of the chest was performed using the standard protocol during bolus administration of intravenous contrast. Multiplanar CT image reconstructions and MIPs were obtained to evaluate the vascular anatomy. CONTRAST:  151mL ISOVUE-370 IOPAMIDOL (ISOVUE-370) INJECTION 76% COMPARISON:  Radiograph 10/15/2017. Report from chest CT 09/07/2011, images not available. FINDINGS: Cardiovascular: Dilatation of the main pulmonary artery of 3.8 cm, and particularly the right and left main pulmonary arteries measuring 3.6 cm respectively. There are no intraluminal filling defects to the segmental level, subsegmental evaluation is limited due to poor distal perfusion. Multi chamber cardiomegaly with prominent right heart dilatation. Normal caliber abdominal aorta with minimal atherosclerosis. 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 No  pericardial effusion. Mediastinum/Nodes: Small mediastinal nodes, largest 7 mm upper paratracheal. There is a calcified paraesophageal node adjacent to the mid-distal esophagus. No hilar adenopathy. The esophagus is decompressed. Lungs/Pleura: Small dependent consolidations in both lower lobes. Additional consolidation in the dependent right upper lobe with adjacent patchy ground-glass opacity. Scattered atelectasis in both lungs. No pleural fluid. Trachea and mainstem bronchi are patent, there is mild patient motion artifact. Upper Abdomen: Ovoid 2mm soft tissue density adjacent to the splenic hilum and pancreatic tail may be a lymph node or splenule. Musculoskeletal: There are no acute or suspicious osseous abnormalities. Review of the MIP images confirms the above findings. IMPRESSION: 1. No pulmonary embolus in the segmental level. 2. Significantly dilated main pulmonary arteries consistent with pulmonary arterial hypertension. 3. Dependent consolidations within both lower lobes and the right upper lobe, pneumonia versus aspiration. Adjacent right upper lobe ground-glass opacities likely part of same process, less likely superimposed pulmonary edema. 4. 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 Aortic Atherosclerosis (ICD10-I70.0). Electronically Signed   By: Jeb Levering M.D.   On: 10/17/2017 20:56        Scheduled Meds: . abacavir  600 mg Oral Daily   And  . lamiVUDine  300 mg Oral Daily  . atazanavir  400 mg Oral Q breakfast  . atorvastatin  10 mg Oral BID  . divalproex  500 mg Oral 2 times per day  . enoxaparin (LOVENOX) injection  40 mg Subcutaneous Q24H  . guaiFENesin  1,200 mg Oral BID  . haloperidol  20 mg Oral TID  . hydrOXYzine  25 mg Oral QHS  . ipratropium-albuterol  3 mL Nebulization Q6H  . magnesium oxide  400 mg Oral Daily  . mouth rinse  15 mL Mouth Rinse BID  . methylPREDNISolone (SOLU-MEDROL) injection  40 mg Intravenous Q6H  . QUEtiapine  100  mg Oral 2 times per day  . raltegravir  400 mg Oral BID  . sulfamethoxazole-trimethoprim  2 tablet Oral TID  . trihexyphenidyl  2 mg Oral BID WC  . valACYclovir  1,000 mg Oral Daily  . zolpidem  5 mg Oral QHS   Continuous Infusions: . piperacillin-tazobactam (ZOSYN)  IV Stopped (10/18/17 1451)     LOS: 6 days    Time spent: 35 minutes. Greater than 50% of this time was spent in direct contact with the patient coordinating care.     Lelon Frohlich, MD [facility] Pager [phone]  If 7PM-7AM, please contact night-coverage [email or web address] Password [facility] 10/18/2017, 3:28 PM

## 2017-10-19 LAB — CREATININE, SERUM
CREATININE: 1.04 mg/dL — AB (ref 0.44–1.00)
GFR calc non Af Amer: 60 mL/min — ABNORMAL LOW (ref >60)

## 2017-10-19 NOTE — Plan of Care (Signed)
progressing 

## 2017-10-19 NOTE — Progress Notes (Signed)
PROGRESS NOTE    Karina Clarke  IDC:[id number] DOB: September 07, 1961 DOA: 10/12/2017 PCP: Rosita Fire, MD     Brief Narrative:  56 year old woman admitted from a group home on 11/18 due to shortness of breath.  Found to have extensive bilateral pneumonia, still remains with very high oxygen requirements.  Past medical history significant for schizophrenia and HIV/AIDS.   Assessment & Plan:   Active Problems:   Sepsis ([place])   Tobacco use disorder   HIV (human immunodeficiency virus infection) ([place])   Schizophrenia ([place])   HCAP (healthcare-associated pneumonia)   Acute respiratory failure with hypoxia ([place])   AIDS ([place])   Acute hypoxemic respiratory failure -Mainly due to community-acquired pneumonia, possibly small component of mild pulmonary edema from aggressive volume resuscitation. -Echo shows EF of 65-70% with grade 1 diastolic dysfunction and no wall motion abnormalities.  No schedule Lasix has been ordered although she has received intermittent doses of Lasix. -Still requiring 14-15 L of oxygen via high flow nasal cannula. -CT angios without PE although she does have dependent consolidation within bilateral lower lobes and right upper lobe. -Influenza PCR screen is negative. -Remains on Zosyn and IV steroids. -Dr. Luan Pulling is following.  Community-acquired pneumonia -Initially started on vancomycin and cefepime and transitioned over to Augmentin on 11/19. -Have again switched over to [place] on 11/24 given her slow improvement to cover potential aspiration as well. -She is also being treated for PCP pneumonia given her HIV with CD4 count of 150.  Continue Bactrim and steroids. -PCP smear has been requested.  HIV/AIDS -Last known CD4 count was 150 in September 2018, she follows outpatient with ID at [facility]. -Continue antiretrovirals.  Schizophrenia -Mood is stable, continue current psychotropic medications.   DVT prophylaxis: Lovenox Code Status: Full code Family  Communication: Patient only Disposition Plan: Back to group home pending medical stability  Consultants:   Pulmonary, Dr. Luan Pulling  Procedures:   Echo with results as above  Antimicrobials:  Anti-infectives (From admission, onward)   Start     Dose/Rate Route Frequency Ordered Stop   10/18/17 1100  piperacillin-tazobactam (ZOSYN) IVPB 3.375 g     3.375 g 12.5 mL/hr over 240 Minutes Intravenous Every 8 hours 10/18/17 1039     10/15/17 1600  sulfamethoxazole-trimethoprim (BACTRIM DS,SEPTRA DS) 800-160 MG per tablet 3 tablet  Status:  Discontinued     3 tablet Oral 3 times daily 10/15/17 1008 10/15/17 1017   10/15/17 1600  sulfamethoxazole-trimethoprim (BACTRIM DS,SEPTRA DS) 800-160 MG per tablet 2 tablet     2 tablet Oral 3 times daily 10/15/17 1017     10/15/17 1030  sulfamethoxazole-trimethoprim (BACTRIM DS,SEPTRA DS) 800-160 MG per tablet 1 tablet     1 tablet Oral  Once 10/15/17 1018 10/15/17 1045   10/14/17 1215  sulfamethoxazole-trimethoprim (BACTRIM DS,SEPTRA DS) 800-160 MG per tablet 1 tablet  Status:  Discontinued     1 tablet Oral Every 12 hours 10/14/17 1214 10/15/17 1008   10/13/17 2200  amoxicillin-clavulanate (AUGMENTIN) 875-125 MG per tablet 1 tablet  Status:  Discontinued     1 tablet Oral Every 12 hours 10/13/17 1750 10/18/17 1032   10/13/17 1000  abacavir-lamiVUDine (EPZICOM) 600-300 MG per tablet 1 tablet  Status:  Discontinued     1 tablet Oral Daily 10/12/17 1634 10/13/17 0824   10/13/17 1000  valACYclovir (VALTREX) tablet 1,000 mg     1,000 mg Oral Daily 10/12/17 1634     10/13/17 1000  abacavir (ZIAGEN) tablet 600 mg     600  mg Oral Daily 10/13/17 0824     10/13/17 1000  lamiVUDine (EPIVIR) tablet 300 mg     300 mg Oral Daily 10/13/17 0824     10/13/17 0800  atazanavir (REYATAZ) capsule 400 mg     400 mg Oral Daily with breakfast 10/12/17 1634     10/13/17 0100  ceFEPIme (MAXIPIME) 1 g in dextrose 5 % 50 mL IVPB  Status:  Discontinued     1 g 100 mL/hr over  30 Minutes Intravenous Every 8 hours 10/12/17 1732 10/13/17 1750   10/12/17 2200  vancomycin (VANCOCIN) IVPB 750 mg/150 ml premix  Status:  Discontinued     750 mg 150 mL/hr over 60 Minutes Intravenous Every 12 hours 10/12/17 1258 10/12/17 1634   10/12/17 2200  raltegravir (ISENTRESS) tablet 400 mg     400 mg Oral 2 times daily 10/12/17 1634     10/12/17 1700  piperacillin-tazobactam (ZOSYN) IVPB 3.375 g  Status:  Discontinued     3.375 g 12.5 mL/hr over 240 Minutes Intravenous Every 8 hours 10/12/17 1258 10/12/17 1634   10/12/17 1700  ceFEPIme (MAXIPIME) 2 g in dextrose 5 % 50 mL IVPB     2 g 100 mL/hr over 30 Minutes Intravenous  Once 10/12/17 1634 10/12/17 1733   10/12/17 1645  vancomycin (VANCOCIN) IVPB 1000 mg/200 mL premix  Status:  Discontinued     1,000 mg 200 mL/hr over 60 Minutes Intravenous  Once 10/12/17 1634 10/12/17 1657   10/12/17 1230  vancomycin (VANCOCIN) IVPB 1000 mg/200 mL premix     1,000 mg 200 mL/hr over 60 Minutes Intravenous  Once 10/12/17 1229 10/12/17 1404   10/12/17 1030  piperacillin-tazobactam (ZOSYN) IVPB 3.375 g     3.375 g 100 mL/hr over 30 Minutes Intravenous  Once 10/12/17 1025 10/12/17 1113   10/12/17 1015  sulfamethoxazole-trimethoprim (BACTRIM DS,SEPTRA DS) 800-160 MG per tablet 1 tablet     1 tablet Oral  Once 10/12/17 1003 10/12/17 1020   10/12/17 1000  cefTRIAXone (ROCEPHIN) 1 g in dextrose 5 % 50 mL IVPB  Status:  Discontinued     1 g 100 mL/hr over 30 Minutes Intravenous  Once 10/12/17 0951 10/12/17 0952   10/12/17 1000  azithromycin (ZITHROMAX) 500 mg in dextrose 5 % 250 mL IVPB  Status:  Discontinued     500 mg 250 mL/hr over 60 Minutes Intravenous  Once 10/12/17 0951 10/12/17 0952   10/12/17 1000  vancomycin (VANCOCIN) IVPB 1000 mg/200 mL premix     1,000 mg 200 mL/hr over 60 Minutes Intravenous  Once 10/12/17 0954 10/12/17 1139   10/12/17 1000  piperacillin-tazobactam (ZOSYN) IVPB 2.25 g  Status:  Discontinued     2.25 g 100 mL/hr over  30 Minutes Intravenous  Once 10/12/17 0954 10/12/17 1025       Subjective: States she feels a little better, still coughing, still short of breath  Objective: Vitals:   10/19/17 0507 10/19/17 0931 10/19/17 1355 10/19/17 1600  BP: (!) 109/56   99/79  Pulse: 65   83  Resp: 18   18  Temp: 98.4 F (36.9 C)   99.3 F (37.4 C)  TempSrc: Axillary   Oral  SpO2: 98% 94% (!) 79% 98%  Weight:      Height:        Intake/Output Summary (Last 24 hours) at 10/19/2017 1653 Last data filed at 10/18/2017 1700 Gross per 24 hour  Intake 50 ml  Output -  Net 50 ml  Filed Weights   10/12/17 1200 10/12/17 1506  Weight: 99.8 kg (220 lb) 108.3 kg (238 lb 12.1 oz)    Examination:  General exam: Alert, awake, oriented x 3 Respiratory system: Bilateral rhonchi, no wheezing today Cardiovascular system:RRR. No murmurs, rubs, gallops. Gastrointestinal system: Abdomen is nondistended, soft and nontender. No organomegaly or masses felt. Normal bowel sounds heard. Central nervous system:  No focal neurological deficits. Extremities: No C/C/E, +pedal pulses Skin: No rashes, lesions or ulcers Psychiatry: Very flat affect, does not make eye contact    Data Reviewed: I have personally reviewed following labs and imaging studies  CBC: Recent Labs  Lab 10/13/17 0620 10/16/17 0439 10/18/17 0501  WBC 5.3 5.3 7.1  HGB 11.6* 11.3* 12.1  HCT 37.1 37.5 39.2  MCV 97.6 100.8* 98.5  PLT 129* 136* 073   Basic Metabolic Panel: Recent Labs  Lab 10/13/17 0620 10/16/17 0439 10/18/17 0501 10/19/17 0717  NA 139 138 137  --   K 4.3 5.0 4.6  --   CL 108 104 97*  --   CO2 26 29 30   --   GLUCOSE 151* 163* 253*  --   BUN 13 14 27*  --   CREATININE 0.87 0.91 1.17* 1.04*  CALCIUM 8.1* 8.5* 9.1  --    GFR: Estimated Creatinine Clearance: 83.8 mL/min (A) (by C-G formula based on SCr of 1.04 mg/dL (H)). Liver Function Tests: Recent Labs  Lab 10/13/17 0620  AST 22  ALT 22  ALKPHOS 72  BILITOT  0.3  PROT 6.4*  ALBUMIN 3.1*   No results for input(s): LIPASE, AMYLASE in the last 168 hours. No results for input(s): AMMONIA in the last 168 hours. Coagulation Profile: Recent Labs  Lab 10/12/17 1833  INR 1.01   Cardiac Enzymes: No results for input(s): CKTOTAL, CKMB, CKMBINDEX, TROPONINI in the last 168 hours. BNP (last 3 results) No results for input(s): PROBNP in the last 8760 hours. HbA1C: No results for input(s): HGBA1C in the last 72 hours. CBG: No results for input(s): GLUCAP in the last 168 hours. Lipid Profile: No results for input(s): CHOL, HDL, LDLCALC, TRIG, CHOLHDL, LDLDIRECT in the last 72 hours. Thyroid Function Tests: No results for input(s): TSH, T4TOTAL, FREET4, T3FREE, THYROIDAB in the last 72 hours. Anemia Panel: No results for input(s): VITAMINB12, FOLATE, FERRITIN, TIBC, IRON, RETICCTPCT in the last 72 hours. Urine analysis:    Component Value Date/Time   COLORURINE YELLOW 10/12/2017 [place] 10/12/2017 0944   LABSPEC 1.017 10/12/2017 0944   PHURINE 5.0 10/12/2017 [place] 10/12/2017 0944   HGBUR NEGATIVE 10/12/2017 [place] 10/12/2017 [place] 10/12/2017 0944   PROTEINUR NEGATIVE 10/12/2017 0944   NITRITE NEGATIVE 10/12/2017 0944   LEUKOCYTESUR NEGATIVE 10/12/2017 0944   Sepsis Labs: @LABRCNTIP (procalcitonin:4,lacticidven:4)  ) Recent Results (from the past 240 hour(s))  Blood Culture (routine x 2)     Status: None   Collection Time: 10/12/17 10:00 AM  Result Value Ref Range Status   Specimen Description BLOOD RIGHT ANTECUBITAL DRAWN BY RN  Final   Special Requests   Final    BOTTLES DRAWN AEROBIC AND ANAEROBIC Blood Culture adequate volume   Culture NO GROWTH 5 DAYS  Final   Report Status 10/17/2017 FINAL  Final  Blood Culture (routine x 2)     Status: None   Collection Time: 10/12/17 10:00 AM  Result Value Ref Range Status   Specimen Description   Final  BLOOD  RIGHT HAND BOTTLES DRAWN AEROBIC AND ANAEROBIC   Special Requests Blood Culture adequate volume  Final   Culture NO GROWTH 5 DAYS  Final   Report Status 10/17/2017 FINAL  Final  MRSA PCR Screening     Status: None   Collection Time: 10/12/17  3:13 PM  Result Value Ref Range Status   MRSA by PCR NEGATIVE NEGATIVE Final    Comment:        The GeneXpert MRSA Assay (FDA approved for NASAL specimens only), is one component of a comprehensive MRSA colonization surveillance program. It is not intended to diagnose MRSA infection nor to guide or monitor treatment for MRSA infections.          Radiology Studies: Ct Angio Chest Pe W Or Wo Contrast  Result Date: 10/17/2017 CLINICAL DATA:  Shortness of breath. EXAM: CT ANGIOGRAPHY CHEST WITH CONTRAST TECHNIQUE: Multidetector CT imaging of the chest was performed using the standard protocol during bolus administration of intravenous contrast. Multiplanar CT image reconstructions and MIPs were obtained to evaluate the vascular anatomy. CONTRAST:  129mL ISOVUE-370 IOPAMIDOL (ISOVUE-370) INJECTION 76% COMPARISON:  Radiograph 10/15/2017. Report from chest CT 09/07/2011, images not available. FINDINGS: Cardiovascular: Dilatation of the main pulmonary artery of 3.8 cm, and particularly the right and left main pulmonary arteries measuring 3.6 cm respectively. There are no intraluminal filling defects to the segmental level, subsegmental evaluation is limited due to poor distal perfusion. Multi chamber cardiomegaly with prominent right heart dilatation. Normal caliber abdominal aorta with minimal atherosclerosis. 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 No pericardial effusion. Mediastinum/Nodes: Small mediastinal nodes, largest 7 mm upper paratracheal. There is a calcified paraesophageal node adjacent to the mid-distal esophagus. No hilar adenopathy. The esophagus is decompressed. Lungs/Pleura: Small dependent consolidations in  both lower lobes. Additional consolidation in the dependent right upper lobe with adjacent patchy ground-glass opacity. Scattered atelectasis in both lungs. No pleural fluid. Trachea and mainstem bronchi are patent, there is mild patient motion artifact. Upper Abdomen: Ovoid 55mm soft tissue density adjacent to the splenic hilum and pancreatic tail may be a lymph node or splenule. Musculoskeletal: There are no acute or suspicious osseous abnormalities. Review of the MIP images confirms the above findings. IMPRESSION: 1. No pulmonary embolus in the segmental level. 2. Significantly dilated main pulmonary arteries consistent with pulmonary arterial hypertension. 3. Dependent consolidations within both lower lobes and the right upper lobe, pneumonia versus aspiration. Adjacent right upper lobe ground-glass opacities likely part of same process, less likely superimposed pulmonary edema. 4. 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 Aortic Atherosclerosis (ICD10-I70.0). Electronically Signed   By: Jeb Levering M.D.   On: 10/17/2017 20:56        Scheduled Meds: . abacavir  600 mg Oral Daily   And  . lamiVUDine  300 mg Oral Daily  . atazanavir  400 mg Oral Q breakfast  . atorvastatin  10 mg Oral BID  . divalproex  500 mg Oral 2 times per day  . enoxaparin (LOVENOX) injection  40 mg Subcutaneous Q24H  . guaiFENesin  1,200 mg Oral BID  . haloperidol  20 mg Oral TID  . hydrOXYzine  25 mg Oral QHS  . ipratropium-albuterol  3 mL Nebulization Q6H  . magnesium oxide  400 mg Oral Daily  . mouth rinse  15 mL Mouth Rinse BID  . methylPREDNISolone (SOLU-MEDROL) injection  40 mg Intravenous Q6H  . QUEtiapine  100 mg Oral 2 times per day  . raltegravir  400 mg Oral BID  . sulfamethoxazole-trimethoprim  2 tablet Oral TID  . trihexyphenidyl  2 mg Oral BID WC  . valACYclovir  1,000 mg Oral Daily  . zolpidem  5 mg Oral QHS   Continuous Infusions: . piperacillin-tazobactam (ZOSYN)  IV  Stopped (10/19/17 1608)     LOS: 7 days    Time spent: 25 minutes. Greater than 50% of this time was spent in direct contact with the patient coordinating care.     Lelon Frohlich, MD [facility] Pager [phone]  If 7PM-7AM, please contact night-coverage [email or web address] Password [email or web address] 10/19/2017, 4:53 PM

## 2017-10-19 NOTE — Progress Notes (Signed)
Subjective: She says she feels a little bit better.  She has no new complaints.  She is coughing mostly nonproductively.  Objective: Vital signs in last 24 hours: Temp:  [98.4 F (36.9 C)-98.9 F (37.2 C)] 98.4 F (36.9 C) (11/25 0507) Pulse Rate:  [65-75] 65 (11/25 0507) Resp:  [18-19] 18 (11/25 0507) BP: (109-133)/(56-76) 109/56 (11/25 0507) SpO2:  [91 %-98 %] 94 % (11/25 0931) FiO2 (%):  [15 %] 15 % (11/24 1453) Weight change:  Last BM Date: 10/13/17  Intake/Output from previous day: 11/24 0701 - 11/25 0700 In: 50 [IV Piggyback:50] Out: 1680 [Urine:1680]  PHYSICAL EXAM General appearance: alert and cooperative Resp: rales bilaterally and rhonchi bilaterally Cardio: regular rate and rhythm, S1, S2 normal, no murmur, click, rub or gallop GI: soft, non-tender; bowel sounds normal; no masses,  no organomegaly Extremities: extremities normal, atraumatic, no cyanosis or edema Flat affect.  Lab Results:  Results for orders placed or performed during the hospital encounter of 10/12/17 (from the past 48 hour(s))  Basic metabolic panel     Status: Abnormal   Collection Time: 10/18/17  5:01 AM  Result Value Ref Range   Sodium 137 135 - 145 mmol/L   Potassium 4.6 3.5 - 5.1 mmol/L   Chloride 97 (L) 101 - 111 mmol/L   CO2 30 22 - 32 mmol/L   Glucose, Bld 253 (H) 65 - 99 mg/dL   BUN 27 (H) 6 - 20 mg/dL   Creatinine, Ser 1.17 (H) 0.44 - 1.00 mg/dL   Calcium 9.1 8.9 - 10.3 mg/dL   GFR calc non Af Amer 52 (L) >60 mL/min   GFR calc Af Amer >60 >60 mL/min    Comment: (NOTE) The eGFR has been calculated using the CKD EPI equation. This calculation has not been validated in all clinical situations. eGFR's persistently <60 mL/min signify possible Chronic Kidney Disease.    Anion gap 10 5 - 15  CBC     Status: Abnormal   Collection Time: 10/18/17  5:01 AM  Result Value Ref Range   WBC 7.1 4.0 - 10.5 K/uL   RBC 3.98 3.87 - 5.11 MIL/uL   Hemoglobin 12.1 12.0 - 15.0 g/dL   HCT 39.2  36.0 - 46.0 %   MCV 98.5 78.0 - 100.0 fL   MCH 30.4 26.0 - 34.0 pg   MCHC 30.9 30.0 - 36.0 g/dL   RDW 15.6 (H) 11.5 - 15.5 %   Platelets 165 150 - 400 K/uL  Creatinine, serum     Status: Abnormal   Collection Time: 10/19/17  7:17 AM  Result Value Ref Range   Creatinine, Ser 1.04 (H) 0.44 - 1.00 mg/dL   GFR calc non Af Amer 60 (L) >60 mL/min   GFR calc Af Amer >60 >60 mL/min    Comment: (NOTE) The eGFR has been calculated using the CKD EPI equation. This calculation has not been validated in all clinical situations. eGFR's persistently <60 mL/min signify possible Chronic Kidney Disease.     ABGS No results for input(s): PHART, PO2ART, TCO2, HCO3 in the last 72 hours.  Invalid input(s): PCO2 CULTURES Recent Results (from the past 240 hour(s))  Blood Culture (routine x 2)     Status: None   Collection Time: 10/12/17 10:00 AM  Result Value Ref Range Status   Specimen Description BLOOD RIGHT ANTECUBITAL DRAWN BY RN  Final   Special Requests   Final    BOTTLES DRAWN AEROBIC AND ANAEROBIC Blood Culture adequate volume   Culture  NO GROWTH 5 DAYS  Final   Report Status 10/17/2017 FINAL  Final  Blood Culture (routine x 2)     Status: None   Collection Time: 10/12/17 10:00 AM  Result Value Ref Range Status   Specimen Description   Final    BLOOD RIGHT HAND BOTTLES DRAWN AEROBIC AND ANAEROBIC   Special Requests Blood Culture adequate volume  Final   Culture NO GROWTH 5 DAYS  Final   Report Status 10/17/2017 FINAL  Final  MRSA PCR Screening     Status: None   Collection Time: 10/12/17  3:13 PM  Result Value Ref Range Status   MRSA by PCR NEGATIVE NEGATIVE Final    Comment:        The GeneXpert MRSA Assay (FDA approved for NASAL specimens only), is one component of a comprehensive MRSA colonization surveillance program. It is not intended to diagnose MRSA infection nor to guide or monitor treatment for MRSA infections.    Studies/Results: Ct Angio Chest Pe W Or Wo  Contrast  Result Date: 10/17/2017 CLINICAL DATA:  Shortness of breath. EXAM: CT ANGIOGRAPHY CHEST WITH CONTRAST TECHNIQUE: Multidetector CT imaging of the chest was performed using the standard protocol during bolus administration of intravenous contrast. Multiplanar CT image reconstructions and MIPs were obtained to evaluate the vascular anatomy. CONTRAST:  157m ISOVUE-370 IOPAMIDOL (ISOVUE-370) INJECTION 76% COMPARISON:  Radiograph 10/15/2017. Report from chest CT 09/07/2011, images not available. FINDINGS: Cardiovascular: Dilatation of the main pulmonary artery of 3.8 cm, and particularly the right and left main pulmonary arteries measuring 3.6 cm respectively. There are no intraluminal filling defects to the segmental level, subsegmental evaluation is limited due to poor distal perfusion. Multi chamber cardiomegaly with prominent right heart dilatation. Normal caliber abdominal aorta with minimal atherosclerosis. 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 No pericardial effusion. Mediastinum/Nodes: Small mediastinal nodes, largest 7 mm upper paratracheal. There is a calcified paraesophageal node adjacent to the mid-distal esophagus. No hilar adenopathy. The esophagus is decompressed. Lungs/Pleura: Small dependent consolidations in both lower lobes. Additional consolidation in the dependent right upper lobe with adjacent patchy ground-glass opacity. Scattered atelectasis in both lungs. No pleural fluid. Trachea and mainstem bronchi are patent, there is mild patient motion artifact. Upper Abdomen: Ovoid 986msoft tissue density adjacent to the splenic hilum and pancreatic tail may be a lymph node or splenule. Musculoskeletal: There are no acute or suspicious osseous abnormalities. Review of the MIP images confirms the above findings. IMPRESSION: 1. No pulmonary embolus in the segmental level. 2. Significantly dilated main pulmonary arteries consistent with pulmonary arterial  hypertension. 3. Dependent consolidations within both lower lobes and the right upper lobe, pneumonia versus aspiration. Adjacent right upper lobe ground-glass opacities likely part of same process, less likely superimposed pulmonary edema. 4. 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 Aortic Atherosclerosis (ICD10-I70.0). Electronically Signed   By: MeJeb Levering.D.   On: 10/17/2017 20:56    Medications:  Prior to Admission:  Medications Prior to Admission  Medication Sig Dispense Refill Last Dose  . abacavir-lamiVUDine (EPZICOM) 600-300 MG per tablet Take 1 tablet by mouth daily.   10/12/2017 at Unknown time  . albuterol (PROVENTIL HFA;VENTOLIN HFA) 108 (90 Base) MCG/ACT inhaler Inhale 1-2 puffs into the lungs 2 (two) times daily as needed for wheezing or shortness of breath.   10/12/2017 at Unknown time  . atazanavir (REYATAZ) 200 MG capsule Take 400 mg by mouth daily.    10/12/2017 at Unknown time  . atorvastatin (LIPITOR)  10 MG tablet Take 10 mg by mouth 2 (two) times daily.   10/12/2017 at Unknown time  . divalproex (DEPAKOTE) 500 MG DR tablet Take 500 mg by mouth See admin instructions. Take 1 tablet (500 mg) by mouth at 4pm and 8pm daily   10/11/2017 at Unknown time  . haloperidol (HALDOL) 10 MG tablet Take 20 mg by mouth 3 (three) times daily.    10/11/2017 at Unknown time  . hydrOXYzine (VISTARIL) 25 MG capsule Take 25 mg by mouth at bedtime.    10/12/2017 at Unknown time  . Magnesium Oxide 400 MG CAPS Take 1 capsule (400 mg total) by mouth every morning. 60 each 0 10/12/2017 at Unknown time  . polyethylene glycol (MIRALAX / GLYCOLAX) packet Take 17 g by mouth daily as needed.   unknown at unknown  . QUEtiapine (SEROQUEL) 200 MG tablet Take 100 mg by mouth See admin instructions. Take 1 tablet (200 mg) by mouth with supper and at bedtime daily   10/12/2017 at Unknown time  . raltegravir (ISENTRESS) 400 MG tablet Take 400 mg by mouth 2 (two) times daily.    10/12/2017 at Unknown time  . valACYclovir (VALTREX) 1000 MG tablet Take 1,000 mg by mouth daily.    10/12/2017 at Unknown time  . zolpidem (AMBIEN) 10 MG tablet Take 5 mg by mouth at bedtime.    10/11/2017 at Unknown time  . haloperidol decanoate (HALDOL DECANOATE) 100 MG/ML injection Inject 200 mg into the muscle every 28 (twenty-eight) days. Last injection 06/09/15    Not Taking at Unknown time  . trihexyphenidyl (ARTANE) 2 MG tablet Take 2 mg by mouth 2 (two) times daily with a meal.   Taking   Scheduled: . abacavir  600 mg Oral Daily   And  . lamiVUDine  300 mg Oral Daily  . atazanavir  400 mg Oral Q breakfast  . atorvastatin  10 mg Oral BID  . divalproex  500 mg Oral 2 times per day  . enoxaparin (LOVENOX) injection  40 mg Subcutaneous Q24H  . guaiFENesin  1,200 mg Oral BID  . haloperidol  20 mg Oral TID  . hydrOXYzine  25 mg Oral QHS  . ipratropium-albuterol  3 mL Nebulization Q6H  . magnesium oxide  400 mg Oral Daily  . mouth rinse  15 mL Mouth Rinse BID  . methylPREDNISolone (SOLU-MEDROL) injection  40 mg Intravenous Q6H  . QUEtiapine  100 mg Oral 2 times per day  . raltegravir  400 mg Oral BID  . sulfamethoxazole-trimethoprim  2 tablet Oral TID  . trihexyphenidyl  2 mg Oral BID WC  . valACYclovir  1,000 mg Oral Daily  . zolpidem  5 mg Oral QHS   Continuous: . piperacillin-tazobactam (ZOSYN)  IV Stopped (10/19/17 1275)   [id number] **OR** acetaminophen, albuterol, ibuprofen, ondansetron **OR** ondansetron (ZOFRAN) IV, polyethylene glycol  Assesment: She was admitted with pneumonia which is extensive and bilateral.  She has HIV/AIDS so she is immunocompromised particularly with a relatively low CD4 count.  Her echocardiogram shows left ventricular diastolic dysfunction and mild right ventricular systolic dysfunction and evidence of pulmonary artery hypertension.  I wonder if she has sleep apnea which has not been recognized considering her obesity or if she has COPD and  is chronically hypoxic at her assisted living facility which she had not been recognized perhaps because of her schizophrenia.  She is still requiring high flow oxygen.  She does look a little bit better. Active Problems:   Sepsis ([place])  Tobacco use disorder   HIV (human immunodeficiency virus infection) ([place])   Schizophrenia ([place])   HCAP (healthcare-associated pneumonia)   Acute respiratory failure with hypoxia ([place])   AIDS ([place])    Plan: Continue current treatments    LOS: 7 days   Colbe Viviano L 10/19/2017, 10:16 AM

## 2017-10-20 NOTE — Progress Notes (Signed)
Subjective: She has no new complaints.  She is still requiring high flow nasal oxygen.  She says she is short of breath.  She is coughing a little bit.  Objective: Vital signs in last 24 hours: Temp:  [98.1 F (36.7 C)-99.3 F (37.4 C)] 98.1 F (36.7 C) (11/26 0553) Pulse Rate:  [68-83] 68 (11/26 0553) Resp:  [16-20] 18 (11/26 0553) BP: (99-137)/(75-79) 137/78 (11/26 0553) SpO2:  [70 %-99 %] 83 % (11/26 0745) Weight change:  Last BM Date: 10/13/17  Intake/Output from previous day: 11/25 0701 - 11/26 0700 In: -  Out: 500 [Urine:500]  PHYSICAL EXAM General appearance: alert Resp: rales bilaterally and rhonchi bilaterally Cardio: regular rate and rhythm, S1, S2 normal, no murmur, click, rub or gallop GI: soft, non-tender; bowel sounds normal; no masses,  no organomegaly Extremities: extremities normal, atraumatic, no cyanosis or edema Flat affect  Lab Results:  Results for orders placed or performed during the hospital encounter of 10/12/17 (from the past 48 hour(s))  Creatinine, serum     Status: Abnormal   Collection Time: 10/19/17  7:17 AM  Result Value Ref Range   Creatinine, Ser 1.04 (H) 0.44 - 1.00 mg/dL   GFR calc non Af Amer 60 (L) >60 mL/min   GFR calc Af Amer >60 >60 mL/min    Comment: (NOTE) The eGFR has been calculated using the CKD EPI equation. This calculation has not been validated in all clinical situations. eGFR's persistently <60 mL/min signify possible Chronic Kidney Disease.     ABGS No results for input(s): PHART, PO2ART, TCO2, HCO3 in the last 72 hours.  Invalid input(s): PCO2 CULTURES Recent Results (from the past 240 hour(s))  Blood Culture (routine x 2)     Status: None   Collection Time: 10/12/17 10:00 AM  Result Value Ref Range Status   Specimen Description BLOOD RIGHT ANTECUBITAL DRAWN BY RN  Final   Special Requests   Final    BOTTLES DRAWN AEROBIC AND ANAEROBIC Blood Culture adequate volume   Culture NO GROWTH 5 DAYS  Final   Report Status 10/17/2017 FINAL  Final  Blood Culture (routine x 2)     Status: None   Collection Time: 10/12/17 10:00 AM  Result Value Ref Range Status   Specimen Description   Final    BLOOD RIGHT HAND BOTTLES DRAWN AEROBIC AND ANAEROBIC   Special Requests Blood Culture adequate volume  Final   Culture NO GROWTH 5 DAYS  Final   Report Status 10/17/2017 FINAL  Final  MRSA PCR Screening     Status: None   Collection Time: 10/12/17  3:13 PM  Result Value Ref Range Status   MRSA by PCR NEGATIVE NEGATIVE Final    Comment:        The GeneXpert MRSA Assay (FDA approved for NASAL specimens only), is one component of a comprehensive MRSA colonization surveillance program. It is not intended to diagnose MRSA infection nor to guide or monitor treatment for MRSA infections.    Studies/Results: No results found.  Medications:  Prior to Admission:  Medications Prior to Admission  Medication Sig Dispense Refill Last Dose  . abacavir-lamiVUDine (EPZICOM) 600-300 MG per tablet Take 1 tablet by mouth daily.   10/12/2017 at Unknown time  . albuterol (PROVENTIL HFA;VENTOLIN HFA) 108 (90 Base) MCG/ACT inhaler Inhale 1-2 puffs into the lungs 2 (two) times daily as needed for wheezing or shortness of breath.   10/12/2017 at Unknown time  . atazanavir (REYATAZ) 200 MG capsule Take 400 mg  by mouth daily.    10/12/2017 at Unknown time  . atorvastatin (LIPITOR) 10 MG tablet Take 10 mg by mouth 2 (two) times daily.   10/12/2017 at Unknown time  . divalproex (DEPAKOTE) 500 MG DR tablet Take 500 mg by mouth See admin instructions. Take 1 tablet (500 mg) by mouth at 4pm and 8pm daily   10/11/2017 at Unknown time  . haloperidol (HALDOL) 10 MG tablet Take 20 mg by mouth 3 (three) times daily.    10/11/2017 at Unknown time  . hydrOXYzine (VISTARIL) 25 MG capsule Take 25 mg by mouth at bedtime.    10/12/2017 at Unknown time  . Magnesium Oxide 400 MG CAPS Take 1 capsule (400 mg total) by mouth every morning. 60  each 0 10/12/2017 at Unknown time  . polyethylene glycol (MIRALAX / GLYCOLAX) packet Take 17 g by mouth daily as needed.   unknown at unknown  . QUEtiapine (SEROQUEL) 200 MG tablet Take 100 mg by mouth See admin instructions. Take 1 tablet (200 mg) by mouth with supper and at bedtime daily   10/12/2017 at Unknown time  . raltegravir (ISENTRESS) 400 MG tablet Take 400 mg by mouth 2 (two) times daily.   10/12/2017 at Unknown time  . valACYclovir (VALTREX) 1000 MG tablet Take 1,000 mg by mouth daily.    10/12/2017 at Unknown time  . zolpidem (AMBIEN) 10 MG tablet Take 5 mg by mouth at bedtime.    10/11/2017 at Unknown time  . haloperidol decanoate (HALDOL DECANOATE) 100 MG/ML injection Inject 200 mg into the muscle every 28 (twenty-eight) days. Last injection 06/09/15    Not Taking at Unknown time  . trihexyphenidyl (ARTANE) 2 MG tablet Take 2 mg by mouth 2 (two) times daily with a meal.   Taking   Scheduled: . abacavir  600 mg Oral Daily   And  . lamiVUDine  300 mg Oral Daily  . atazanavir  400 mg Oral Q breakfast  . atorvastatin  10 mg Oral BID  . divalproex  500 mg Oral 2 times per day  . enoxaparin (LOVENOX) injection  40 mg Subcutaneous Q24H  . guaiFENesin  1,200 mg Oral BID  . haloperidol  20 mg Oral TID  . hydrOXYzine  25 mg Oral QHS  . ipratropium-albuterol  3 mL Nebulization Q6H  . magnesium oxide  400 mg Oral Daily  . mouth rinse  15 mL Mouth Rinse BID  . methylPREDNISolone (SOLU-MEDROL) injection  40 mg Intravenous Q6H  . QUEtiapine  100 mg Oral 2 times per day  . raltegravir  400 mg Oral BID  . sulfamethoxazole-trimethoprim  2 tablet Oral TID  . trihexyphenidyl  2 mg Oral BID WC  . valACYclovir  1,000 mg Oral Daily  . zolpidem  5 mg Oral QHS   Continuous: . piperacillin-tazobactam (ZOSYN)  IV Stopped (10/20/17 0645)   NTZ:[id number] **OR** acetaminophen, albuterol, ibuprofen, ondansetron **OR** ondansetron (ZOFRAN) IV, polyethylene glycol  Assesment: She was admitted  with pneumonia and acute hypoxic respiratory failure.  Her situation is complicated by the fact that she has HIV/AIDS with low CD4 count.  Is also complicated by the fact that she has schizophrenia.  Echocardiogram is suggestive at least that she has some pulmonary hypertension and some right ventricular strain.  I wonder if she has sleep apnea that has not been recognized or diagnosed or COPD that has not been recognized or diagnosed.  She does have significant smoking history.  She is still hypoxic but less so when she  transitions to room air.  She is still on 15 L nasal cannula. Active Problems:   Sepsis ([place])   Tobacco use disorder   HIV (human immunodeficiency virus infection) ([place])   Schizophrenia ([place])   HCAP (healthcare-associated pneumonia)   Acute respiratory failure with hypoxia ([place])   AIDS ([place])    Plan: Continue treatments.  She is on IV antibiotics IV steroids and I do not think there is much to add at this point.  She is still requiring high flow nasal cannula.  She will need PFT once she is over this.  She will need sleep study when she is over this.  She may require bronchoscopy at some point but she is not a candidate now while she is on 15 L of O2    LOS: 8 days   Rakin Lemelle L 10/20/2017, 8:12 AM

## 2017-10-20 NOTE — Clinical Social Work Note (Signed)
Clinical Social Work Assessment  Patient Details  Name: Karina Clarke MRN: [id number] Date of Birth: [birth year]  Date of referral:  10/20/17               Reason for consult:  Other (Comment Required)(Patient is from [facility]. )                Permission sought to share information with:    Permission granted to share information::     Name::        Agency::  Mortimer Fries, facility administrator.   Relationship::     Contact Information:     Housing/Transportation Living arrangements for the past 2 months:  [place] of Information:  Facility Patient Interpreter Needed:  None Criminal Activity/Legal Involvement Pertinent to Current Situation/Hospitalization:  No - Comment as needed Significant Relationships:  Siblings Lives with:  Facility Resident Do you feel safe going back to the place where you live?  Yes Need for family participation in patient care:  Yes (Comment)  Care giving concerns:  None identified. Facility resident.    Social Worker assessment / plan:  LCSW spoke with Mortimer Fries, facility administrator. Patient has been a resident at the facility for almost three years. Her sister, who is her legal guardian is supportive. Patient requires assistance with bathing and dressing. She feeds herself. She can ambulate with a a walker most of the time. Patient can return to the facility at discharge.   Employment status:  Disabled (Comment on whether or not currently receiving Disability) Insurance information:  Medicaid In Post PT Recommendations:  Not assessed at this time Information / Referral to community resources:     Patient/Family's Response to care:  Patient is agreeable to return to the facility at discharge.   Patient/Family's Understanding of and Emotional Response to Diagnosis, Current Treatment, and Prognosis: Patient and family understand patient's diagnosis, treatment and prognosis.   Emotional  Assessment Appearance:  Appears stated age Attitude/Demeanor/Rapport:    Affect (typically observed):  Calm Orientation:  Oriented to Self, Oriented to Place, Oriented to  Time, Oriented to Situation Alcohol / Substance use:  Not Applicable Psych involvement (Current and /or in the community):  No (Comment)  Discharge Needs  Concerns to be addressed:  Other (Comment Required(Return to facility. ) Readmission within the last 30 days:  Yes Current discharge risk:  None Barriers to Discharge:  No Barriers Identified   Ihor Gully, LCSW 10/20/2017, 12:00 PM

## 2017-10-20 NOTE — Progress Notes (Signed)
PROGRESS NOTE    Karina Clarke  DEY:[id number] DOB: 1961-02-03 DOA: 10/12/2017 PCP: Rosita Fire, MD     Brief Narrative:  56 year old woman admitted from a group home on 11/18 due to shortness of breath.  Found to have extensive bilateral pneumonia, still remains with very high oxygen requirements.  Past medical history significant for schizophrenia and HIV/AIDS.  Assessment & Plan:   Active Problems:   Sepsis ([place])   Tobacco use disorder   HIV (human immunodeficiency virus infection) ([place])   Schizophrenia ([place])   HCAP (healthcare-associated pneumonia)   Acute respiratory failure with hypoxia ([place])   AIDS ([place])   Acute hypoxemic respiratory failure -Mainly due to community-acquired pneumonia, possibly small component of mild pulmonary edema from aggressive volume resuscitation. -Echo shows EF of 65-70% with grade 1 diastolic dysfunction and no wall motion abnormalities.  No schedule Lasix has been ordered although she has received intermittent doses of Lasix. -Still requiring 14-15 L of oxygen via high flow nasal cannula. -CT angios without PE although she does have dependent consolidation within bilateral lower lobes and right upper lobe. -Influenza PCR screen is negative. -Remains on Zosyn and IV steroids. -Dr. Luan Pulling is following. -Working on weaning oxygen as tolerated.   Community-acquired pneumonia -Initially started on vancomycin and cefepime and transitioned over to Augmentin on 11/19. -Started Zosyn on 11/24 and she is improving clinically.  Hopefully can further de-escalate 11/27.  -She is also being treated for PCP pneumonia given her HIV with CD4 count of 150.  Continue Bactrim and steroids. -PCP smear has been requested.  HIV/AIDS -Last known CD4 count was 150 in September 2018, she follows outpatient with ID at [facility]. -Continue antiretrovirals.  Schizophrenia -Mood is stable, continue current psychotropic medications.  DVT prophylaxis: Lovenox Code  Status: Full code Family Communication: Patient only Disposition Plan: Back to group home   Consultants:   Pulmonary, Dr. Luan Pulling  Procedures:   Echo with results as above  Antimicrobials:  Anti-infectives (From admission, onward)   Start     Dose/Rate Route Frequency Ordered Stop   10/18/17 1100  piperacillin-tazobactam (ZOSYN) IVPB 3.375 g     3.375 g 12.5 mL/hr over 240 Minutes Intravenous Every 8 hours 10/18/17 1039     10/15/17 1600  sulfamethoxazole-trimethoprim (BACTRIM DS,SEPTRA DS) 800-160 MG per tablet 3 tablet  Status:  Discontinued     3 tablet Oral 3 times daily 10/15/17 1008 10/15/17 1017   10/15/17 1600  sulfamethoxazole-trimethoprim (BACTRIM DS,SEPTRA DS) 800-160 MG per tablet 2 tablet     2 tablet Oral 3 times daily 10/15/17 1017     10/15/17 1030  sulfamethoxazole-trimethoprim (BACTRIM DS,SEPTRA DS) 800-160 MG per tablet 1 tablet     1 tablet Oral  Once 10/15/17 1018 10/15/17 1045   10/14/17 1215  sulfamethoxazole-trimethoprim (BACTRIM DS,SEPTRA DS) 800-160 MG per tablet 1 tablet  Status:  Discontinued     1 tablet Oral Every 12 hours 10/14/17 1214 10/15/17 1008   10/13/17 2200  amoxicillin-clavulanate (AUGMENTIN) 875-125 MG per tablet 1 tablet  Status:  Discontinued     1 tablet Oral Every 12 hours 10/13/17 1750 10/18/17 1032   10/13/17 1000  abacavir-lamiVUDine (EPZICOM) 600-300 MG per tablet 1 tablet  Status:  Discontinued     1 tablet Oral Daily 10/12/17 1634 10/13/17 0824   10/13/17 1000  valACYclovir (VALTREX) tablet 1,000 mg     1,000 mg Oral Daily 10/12/17 1634     10/13/17 1000  abacavir (ZIAGEN) tablet 600 mg  600 mg Oral Daily 10/13/17 0824     10/13/17 1000  lamiVUDine (EPIVIR) tablet 300 mg     300 mg Oral Daily 10/13/17 0824     10/13/17 0800  atazanavir (REYATAZ) capsule 400 mg     400 mg Oral Daily with breakfast 10/12/17 1634     10/13/17 0100  ceFEPIme (MAXIPIME) 1 g in dextrose 5 % 50 mL IVPB  Status:  Discontinued     1 g 100 mL/hr over  30 Minutes Intravenous Every 8 hours 10/12/17 1732 10/13/17 1750   10/12/17 2200  vancomycin (VANCOCIN) IVPB 750 mg/150 ml premix  Status:  Discontinued     750 mg 150 mL/hr over 60 Minutes Intravenous Every 12 hours 10/12/17 1258 10/12/17 1634   10/12/17 2200  raltegravir (ISENTRESS) tablet 400 mg     400 mg Oral 2 times daily 10/12/17 1634     10/12/17 1700  piperacillin-tazobactam (ZOSYN) IVPB 3.375 g  Status:  Discontinued     3.375 g 12.5 mL/hr over 240 Minutes Intravenous Every 8 hours 10/12/17 1258 10/12/17 1634   10/12/17 1700  ceFEPIme (MAXIPIME) 2 g in dextrose 5 % 50 mL IVPB     2 g 100 mL/hr over 30 Minutes Intravenous  Once 10/12/17 1634 10/12/17 1733   10/12/17 1645  vancomycin (VANCOCIN) IVPB 1000 mg/200 mL premix  Status:  Discontinued     1,000 mg 200 mL/hr over 60 Minutes Intravenous  Once 10/12/17 1634 10/12/17 1657   10/12/17 1230  vancomycin (VANCOCIN) IVPB 1000 mg/200 mL premix     1,000 mg 200 mL/hr over 60 Minutes Intravenous  Once 10/12/17 1229 10/12/17 1404   10/12/17 1030  piperacillin-tazobactam (ZOSYN) IVPB 3.375 g     3.375 g 100 mL/hr over 30 Minutes Intravenous  Once 10/12/17 1025 10/12/17 1113   10/12/17 1015  sulfamethoxazole-trimethoprim (BACTRIM DS,SEPTRA DS) 800-160 MG per tablet 1 tablet     1 tablet Oral  Once 10/12/17 1003 10/12/17 1020   10/12/17 1000  cefTRIAXone (ROCEPHIN) 1 g in dextrose 5 % 50 mL IVPB  Status:  Discontinued     1 g 100 mL/hr over 30 Minutes Intravenous  Once 10/12/17 0951 10/12/17 0952   10/12/17 1000  azithromycin (ZITHROMAX) 500 mg in dextrose 5 % 250 mL IVPB  Status:  Discontinued     500 mg 250 mL/hr over 60 Minutes Intravenous  Once 10/12/17 0951 10/12/17 0952   10/12/17 1000  vancomycin (VANCOCIN) IVPB 1000 mg/200 mL premix     1,000 mg 200 mL/hr over 60 Minutes Intravenous  Once 10/12/17 0954 10/12/17 1139   10/12/17 1000  piperacillin-tazobactam (ZOSYN) IVPB 2.25 g  Status:  Discontinued     2.25 g 100 mL/hr over  30 Minutes Intravenous  Once 10/12/17 0954 10/12/17 1025       Subjective: States she feels a little better, still coughing, still short of breath  Objective: Vitals:   10/19/17 2000 10/20/17 0553 10/20/17 0745 10/20/17 0800  BP: 130/75 137/78  133/73  Pulse: 73 68  64  Resp: 20 18  18   Temp: 99.1 F (37.3 C) 98.1 F (36.7 C)  98.4 F (36.9 C)  TempSrc: Oral Axillary  Oral  SpO2: 94% 99% (!) 83% 95%  Weight:      Height:        Intake/Output Summary (Last 24 hours) at 10/20/2017 1015 Last data filed at 10/20/2017 0554 Gross per 24 hour  Intake -  Output 500 ml  Net -500  ml   Filed Weights   10/12/17 1200 10/12/17 1506  Weight: 99.8 kg (220 lb) 108.3 kg (238 lb 12.1 oz)    Examination:  General exam: Alert, awake, oriented x 3 Respiratory system: BBS with occasional rhonchi heard.  Cardiovascular system: normal s1, s2 sounds.  No murmurs, rubs, gallops. Gastrointestinal system: Abdomen is nondistended, soft and nontender. No organomegaly or masses felt. Normal bowel sounds heard. Central nervous system:  No focal neurological deficits. Extremities: No C/C/E, +pedal pulses Skin: No rashes, lesions or ulcers Psychiatry: flat affect.   Data Reviewed: I have personally reviewed following labs and imaging studies  CBC: Recent Labs  Lab 10/16/17 0439 10/18/17 0501  WBC 5.3 7.1  HGB 11.3* 12.1  HCT 37.5 39.2  MCV 100.8* 98.5  PLT 136* 774   Basic Metabolic Panel: Recent Labs  Lab 10/16/17 0439 10/18/17 0501 10/19/17 0717  NA 138 137  --   K 5.0 4.6  --   CL 104 97*  --   CO2 29 30  --   GLUCOSE 163* 253*  --   BUN 14 27*  --   CREATININE 0.91 1.17* 1.04*  CALCIUM 8.5* 9.1  --    GFR: Estimated Creatinine Clearance: 83.8 mL/min (A) (by C-G formula based on SCr of 1.04 mg/dL (H)). Liver Function Tests: No results for input(s): AST, ALT, ALKPHOS, BILITOT, PROT, ALBUMIN in the last 168 hours. No results for input(s): LIPASE, AMYLASE in the last 168  hours. No results for input(s): AMMONIA in the last 168 hours. Coagulation Profile: No results for input(s): INR, PROTIME in the last 168 hours. Cardiac Enzymes: No results for input(s): CKTOTAL, CKMB, CKMBINDEX, TROPONINI in the last 168 hours. BNP (last 3 results) No results for input(s): PROBNP in the last 8760 hours. HbA1C: No results for input(s): HGBA1C in the last 72 hours. CBG: No results for input(s): GLUCAP in the last 168 hours. Lipid Profile: No results for input(s): CHOL, HDL, LDLCALC, TRIG, CHOLHDL, LDLDIRECT in the last 72 hours. Thyroid Function Tests: No results for input(s): TSH, T4TOTAL, FREET4, T3FREE, THYROIDAB in the last 72 hours. Anemia Panel: No results for input(s): VITAMINB12, FOLATE, FERRITIN, TIBC, IRON, RETICCTPCT in the last 72 hours. Urine analysis:    Component Value Date/Time   COLORURINE YELLOW 10/12/2017 [place] 10/12/2017 0944   LABSPEC 1.017 10/12/2017 0944   PHURINE 5.0 10/12/2017 [place] 10/12/2017 0944   HGBUR NEGATIVE 10/12/2017 [place] 10/12/2017 [place] 10/12/2017 0944   PROTEINUR NEGATIVE 10/12/2017 0944   NITRITE NEGATIVE 10/12/2017 0944   LEUKOCYTESUR NEGATIVE 10/12/2017 0944    Recent Results (from the past 240 hour(s))  Blood Culture (routine x 2)     Status: None   Collection Time: 10/12/17 10:00 AM  Result Value Ref Range Status   Specimen Description BLOOD RIGHT ANTECUBITAL DRAWN BY RN  Final   Special Requests   Final    BOTTLES DRAWN AEROBIC AND ANAEROBIC Blood Culture adequate volume   Culture NO GROWTH 5 DAYS  Final   Report Status 10/17/2017 FINAL  Final  Blood Culture (routine x 2)     Status: None   Collection Time: 10/12/17 10:00 AM  Result Value Ref Range Status   Specimen Description   Final    BLOOD RIGHT HAND BOTTLES DRAWN AEROBIC AND ANAEROBIC   Special Requests Blood Culture adequate volume  Final   Culture NO GROWTH 5 DAYS  Final  Report Status 10/17/2017 FINAL  Final  MRSA PCR Screening     Status: None   Collection Time: 10/12/17  3:13 PM  Result Value Ref Range Status   MRSA by PCR NEGATIVE NEGATIVE Final    Comment:        The GeneXpert MRSA Assay (FDA approved for NASAL specimens only), is one component of a comprehensive MRSA colonization surveillance program. It is not intended to diagnose MRSA infection nor to guide or monitor treatment for MRSA infections.     Radiology Studies: No results found.  Scheduled Meds: . abacavir  600 mg Oral Daily   And  . lamiVUDine  300 mg Oral Daily  . atazanavir  400 mg Oral Q breakfast  . atorvastatin  10 mg Oral BID  . divalproex  500 mg Oral 2 times per day  . enoxaparin (LOVENOX) injection  40 mg Subcutaneous Q24H  . guaiFENesin  1,200 mg Oral BID  . haloperidol  20 mg Oral TID  . hydrOXYzine  25 mg Oral QHS  . ipratropium-albuterol  3 mL Nebulization Q6H  . magnesium oxide  400 mg Oral Daily  . mouth rinse  15 mL Mouth Rinse BID  . methylPREDNISolone (SOLU-MEDROL) injection  40 mg Intravenous Q6H  . QUEtiapine  100 mg Oral 2 times per day  . raltegravir  400 mg Oral BID  . sulfamethoxazole-trimethoprim  2 tablet Oral TID  . trihexyphenidyl  2 mg Oral BID WC  . valACYclovir  1,000 mg Oral Daily  . zolpidem  5 mg Oral QHS   Continuous Infusions: . piperacillin-tazobactam (ZOSYN)  IV Stopped (10/20/17 0645)     LOS: 8 days    Time spent: 25 minutes. Greater than 50% of this time was spent in direct contact with the patient coordinating care.  Irwin Brakeman, MD [facility] Pager [phone]  If 7PM-7AM, please contact night-coverage [email or web address] Password [email or web address] 10/20/2017, 10:15 AM

## 2017-10-20 NOTE — Progress Notes (Signed)
Respiratory Care note: Walked in to to give the patient her afternoon breathing treatment to find that she had pulled her oxygen down below her chin. The patient's 02 SAT was 74% on room air. I explained again as I did earlier this morning that it was critically important for her to keep her oxygen in her nose and not let her levels get this low. Placed the HFNC back in her nose at 10L per RN the MD would like for the patient's 02 to be weaned down. RN aware.

## 2017-10-20 NOTE — Plan of Care (Signed)
Pt is progressing 

## 2017-10-21 DIAGNOSIS — J441 Chronic obstructive pulmonary disease with (acute) exacerbation: Secondary | ICD-10-CM | POA: Diagnosis present

## 2017-10-21 MED ORDER — IPRATROPIUM-ALBUTEROL 0.5-2.5 (3) MG/3ML IN SOLN
3.0000 mL | Freq: Three times a day (TID) | RESPIRATORY_TRACT | Status: DC
  Administered 2017-10-22 – 2017-10-25 (×10): 3 mL via RESPIRATORY_TRACT
  Filled 2017-10-21 (×12): qty 3

## 2017-10-21 MED ORDER — AMOXICILLIN-POT CLAVULANATE 875-125 MG PO TABS
1.0000 | ORAL_TABLET | Freq: Two times a day (BID) | ORAL | Status: DC
  Administered 2017-10-22 – 2017-10-27 (×11): 1 via ORAL
  Filled 2017-10-21 (×11): qty 1

## 2017-10-21 NOTE — Progress Notes (Signed)
PROGRESS NOTE   Karina Clarke  BSW:[id number] DOB: Jan 29, 1961 DOA: 10/12/2017 PCP: Rosita Fire, MD   Brief Narrative:  56 year old woman admitted from a group home on 11/18 due to shortness of breath.  Found to have extensive bilateral pneumonia, still remains with very high oxygen requirements.  Past medical history significant for schizophrenia and HIV/AIDS.  Assessment & Plan:   Active Problems:   Sepsis ([place])   Tobacco use disorder   HIV (human immunodeficiency virus infection) ([place])   Schizophrenia ([place])   HCAP (healthcare-associated pneumonia)   Acute respiratory failure with hypoxia ([place])   AIDS ([place])   COPD exacerbation ([place])   Acute hypoxemic respiratory failure -Mainly due to community-acquired pneumonia, possibly small component of mild pulmonary edema from aggressive volume resuscitation. -Echo shows EF of 65-70% with grade 1 diastolic dysfunction and no wall motion abnormalities.  No schedule Lasix has been ordered although she has received intermittent doses of Lasix. -Still requiring 10 L of oxygen via high flow nasal cannula but trying to wean down -CT angios without PE although she does have dependent consolidation within bilateral lower lobes and right upper lobe. -Influenza PCR screen is negative. -Remains on Zosyn and IV steroids. -Dr. Luan Pulling is following. -Working on weaning oxygen as tolerated.   Community-acquired pneumonia -Initially started on vancomycin and cefepime and transitioned over to Augmentin on 11/19. -Started Zosyn on 11/24 and she is improving clinically.  Hopefully can further de-escalate 11/27.  -She is also being treated for PCP pneumonia given her HIV with CD4 count of 150.  Continue Bactrim and steroids. -PCP smear has been requested.  HIV/AIDS -Last known CD4 count was 150 in September 2018, she follows outpatient with ID at [facility]. -Continue antiretrovirals.  Schizophrenia -Mood is stable, continue current psychotropic  medications.  DVT prophylaxis: Lovenox Code Status: Full code Family Communication: Patient only Disposition Plan: Back to group home   Consultants:   Pulmonary, Dr. Luan Pulling  Procedures:   Echo with results as above  Antimicrobials:  Anti-infectives (From admission, onward)   Start     Dose/Rate Route Frequency Ordered Stop   10/18/17 1100  piperacillin-tazobactam (ZOSYN) IVPB 3.375 g     3.375 g 12.5 mL/hr over 240 Minutes Intravenous Every 8 hours 10/18/17 1039     10/15/17 1600  sulfamethoxazole-trimethoprim (BACTRIM DS,SEPTRA DS) 800-160 MG per tablet 3 tablet  Status:  Discontinued     3 tablet Oral 3 times daily 10/15/17 1008 10/15/17 1017   10/15/17 1600  sulfamethoxazole-trimethoprim (BACTRIM DS,SEPTRA DS) 800-160 MG per tablet 2 tablet     2 tablet Oral 3 times daily 10/15/17 1017     10/15/17 1030  sulfamethoxazole-trimethoprim (BACTRIM DS,SEPTRA DS) 800-160 MG per tablet 1 tablet     1 tablet Oral  Once 10/15/17 1018 10/15/17 1045   10/14/17 1215  sulfamethoxazole-trimethoprim (BACTRIM DS,SEPTRA DS) 800-160 MG per tablet 1 tablet  Status:  Discontinued     1 tablet Oral Every 12 hours 10/14/17 1214 10/15/17 1008   10/13/17 2200  amoxicillin-clavulanate (AUGMENTIN) 875-125 MG per tablet 1 tablet  Status:  Discontinued     1 tablet Oral Every 12 hours 10/13/17 1750 10/18/17 1032   10/13/17 1000  abacavir-lamiVUDine (EPZICOM) 600-300 MG per tablet 1 tablet  Status:  Discontinued     1 tablet Oral Daily 10/12/17 1634 10/13/17 0824   10/13/17 1000  valACYclovir (VALTREX) tablet 1,000 mg     1,000 mg Oral Daily 10/12/17 1634     10/13/17 1000  abacavir (ZIAGEN)  tablet 600 mg     600 mg Oral Daily 10/13/17 0824     10/13/17 1000  lamiVUDine (EPIVIR) tablet 300 mg     300 mg Oral Daily 10/13/17 0824     10/13/17 0800  atazanavir (REYATAZ) capsule 400 mg     400 mg Oral Daily with breakfast 10/12/17 1634     10/13/17 0100  ceFEPIme (MAXIPIME) 1 g in dextrose 5 % 50 mL IVPB   Status:  Discontinued     1 g 100 mL/hr over 30 Minutes Intravenous Every 8 hours 10/12/17 1732 10/13/17 1750   10/12/17 2200  vancomycin (VANCOCIN) IVPB 750 mg/150 ml premix  Status:  Discontinued     750 mg 150 mL/hr over 60 Minutes Intravenous Every 12 hours 10/12/17 1258 10/12/17 1634   10/12/17 2200  raltegravir (ISENTRESS) tablet 400 mg     400 mg Oral 2 times daily 10/12/17 1634     10/12/17 1700  piperacillin-tazobactam (ZOSYN) IVPB 3.375 g  Status:  Discontinued     3.375 g 12.5 mL/hr over 240 Minutes Intravenous Every 8 hours 10/12/17 1258 10/12/17 1634   10/12/17 1700  ceFEPIme (MAXIPIME) 2 g in dextrose 5 % 50 mL IVPB     2 g 100 mL/hr over 30 Minutes Intravenous  Once 10/12/17 1634 10/12/17 1733   10/12/17 1645  vancomycin (VANCOCIN) IVPB 1000 mg/200 mL premix  Status:  Discontinued     1,000 mg 200 mL/hr over 60 Minutes Intravenous  Once 10/12/17 1634 10/12/17 1657   10/12/17 1230  vancomycin (VANCOCIN) IVPB 1000 mg/200 mL premix     1,000 mg 200 mL/hr over 60 Minutes Intravenous  Once 10/12/17 1229 10/12/17 1404   10/12/17 1030  piperacillin-tazobactam (ZOSYN) IVPB 3.375 g     3.375 g 100 mL/hr over 30 Minutes Intravenous  Once 10/12/17 1025 10/12/17 1113   10/12/17 1015  sulfamethoxazole-trimethoprim (BACTRIM DS,SEPTRA DS) 800-160 MG per tablet 1 tablet     1 tablet Oral  Once 10/12/17 1003 10/12/17 1020   10/12/17 1000  cefTRIAXone (ROCEPHIN) 1 g in dextrose 5 % 50 mL IVPB  Status:  Discontinued     1 g 100 mL/hr over 30 Minutes Intravenous  Once 10/12/17 0951 10/12/17 0952   10/12/17 1000  azithromycin (ZITHROMAX) 500 mg in dextrose 5 % 250 mL IVPB  Status:  Discontinued     500 mg 250 mL/hr over 60 Minutes Intravenous  Once 10/12/17 0951 10/12/17 0952   10/12/17 1000  vancomycin (VANCOCIN) IVPB 1000 mg/200 mL premix     1,000 mg 200 mL/hr over 60 Minutes Intravenous  Once 10/12/17 0954 10/12/17 1139   10/12/17 1000  piperacillin-tazobactam (ZOSYN) IVPB 2.25 g   Status:  Discontinued     2.25 g 100 mL/hr over 30 Minutes Intravenous  Once 10/12/17 0954 10/12/17 1025     Subjective: States she is feeling a lot better, still having difficulty keeping oxygen on.   Objective: Vitals:   10/20/17 2104 10/21/17 0607 10/21/17 0727 10/21/17 1401  BP: 137/81 129/76  (!) 144/76  Pulse: 75 82  78  Resp: 20 18  16   Temp: 98.4 F (36.9 C) 98.5 F (36.9 C)  98.7 F (37.1 C)  TempSrc: Oral Oral  Oral  SpO2: 98% 99% (!) 84% 91%  Weight:      Height:        Intake/Output Summary (Last 24 hours) at 10/21/2017 1519 Last data filed at 10/21/2017 1508 Gross per 24 hour  Intake  1300 ml  Output 2200 ml  Net -900 ml   Filed Weights   10/12/17 1200 10/12/17 1506  Weight: 99.8 kg (220 lb) 108.3 kg (238 lb 12.1 oz)    Examination:  General exam: Alert, awake, oriented x 3 Respiratory system: BBS with occasional rhonchi heard.  Cardiovascular system: normal s1, s2 sounds.  No murmurs, rubs, gallops. Gastrointestinal system: Abdomen is nondistended, soft and nontender. No organomegaly or masses felt. Normal bowel sounds heard. Central nervous system:  No focal neurological deficits. Extremities: No C/C/E, +pedal pulses Skin: No rashes, lesions or ulcers Psychiatry: flat affect.   Data Reviewed: I have personally reviewed following labs and imaging studies  CBC: Recent Labs  Lab 10/16/17 0439 10/18/17 0501  WBC 5.3 7.1  HGB 11.3* 12.1  HCT 37.5 39.2  MCV 100.8* 98.5  PLT 136* 355   Basic Metabolic Panel: Recent Labs  Lab 10/16/17 0439 10/18/17 0501 10/19/17 0717  NA 138 137  --   K 5.0 4.6  --   CL 104 97*  --   CO2 29 30  --   GLUCOSE 163* 253*  --   BUN 14 27*  --   CREATININE 0.91 1.17* 1.04*  CALCIUM 8.5* 9.1  --    GFR: Estimated Creatinine Clearance: 83.8 mL/min (A) (by C-G formula based on SCr of 1.04 mg/dL (H)). Liver Function Tests: No results for input(s): AST, ALT, ALKPHOS, BILITOT, PROT, ALBUMIN in the last 168  hours. No results for input(s): LIPASE, AMYLASE in the last 168 hours. No results for input(s): AMMONIA in the last 168 hours. Coagulation Profile: No results for input(s): INR, PROTIME in the last 168 hours. Cardiac Enzymes: No results for input(s): CKTOTAL, CKMB, CKMBINDEX, TROPONINI in the last 168 hours. BNP (last 3 results) No results for input(s): PROBNP in the last 8760 hours. HbA1C: No results for input(s): HGBA1C in the last 72 hours. CBG: No results for input(s): GLUCAP in the last 168 hours. Lipid Profile: No results for input(s): CHOL, HDL, LDLCALC, TRIG, CHOLHDL, LDLDIRECT in the last 72 hours. Thyroid Function Tests: No results for input(s): TSH, T4TOTAL, FREET4, T3FREE, THYROIDAB in the last 72 hours. Anemia Panel: No results for input(s): VITAMINB12, FOLATE, FERRITIN, TIBC, IRON, RETICCTPCT in the last 72 hours. Urine analysis:    Component Value Date/Time   COLORURINE YELLOW 10/12/2017 [place] 10/12/2017 0944   LABSPEC 1.017 10/12/2017 0944   PHURINE 5.0 10/12/2017 [place] 10/12/2017 [place] 10/12/2017 [place] 10/12/2017 [place] 10/12/2017 [place] 10/12/2017 0944   NITRITE NEGATIVE 10/12/2017 [place] 10/12/2017 0944    Recent Results (from the past 240 hour(s))  Blood Culture (routine x 2)     Status: None   Collection Time: 10/12/17 10:00 AM  Result Value Ref Range Status   Specimen Description BLOOD RIGHT ANTECUBITAL DRAWN BY RN  Final   Special Requests   Final    BOTTLES DRAWN AEROBIC AND ANAEROBIC Blood Culture adequate volume   Culture NO GROWTH 5 DAYS  Final   Report Status 10/17/2017 FINAL  Final  Blood Culture (routine x 2)     Status: None   Collection Time: 10/12/17 10:00 AM  Result Value Ref Range Status   Specimen Description   Final    BLOOD RIGHT HAND BOTTLES DRAWN AEROBIC AND ANAEROBIC   Special Requests Blood  Culture adequate volume  Final  Culture NO GROWTH 5 DAYS  Final   Report Status 10/17/2017 FINAL  Final  MRSA PCR Screening     Status: None   Collection Time: 10/12/17  3:13 PM  Result Value Ref Range Status   MRSA by PCR NEGATIVE NEGATIVE Final    Comment:        The GeneXpert MRSA Assay (FDA approved for NASAL specimens only), is one component of a comprehensive MRSA colonization surveillance program. It is not intended to diagnose MRSA infection nor to guide or monitor treatment for MRSA infections.     Radiology Studies: No results found.  Scheduled Meds: . abacavir  600 mg Oral Daily   And  . lamiVUDine  300 mg Oral Daily  . atazanavir  400 mg Oral Q breakfast  . atorvastatin  10 mg Oral BID  . divalproex  500 mg Oral 2 times per day  . enoxaparin (LOVENOX) injection  40 mg Subcutaneous Q24H  . guaiFENesin  1,200 mg Oral BID  . haloperidol  20 mg Oral TID  . hydrOXYzine  25 mg Oral QHS  . ipratropium-albuterol  3 mL Nebulization Q6H  . magnesium oxide  400 mg Oral Daily  . mouth rinse  15 mL Mouth Rinse BID  . methylPREDNISolone (SOLU-MEDROL) injection  40 mg Intravenous Q6H  . QUEtiapine  100 mg Oral 2 times per day  . raltegravir  400 mg Oral BID  . sulfamethoxazole-trimethoprim  2 tablet Oral TID  . trihexyphenidyl  2 mg Oral BID WC  . valACYclovir  1,000 mg Oral Daily  . zolpidem  5 mg Oral QHS   Continuous Infusions: . piperacillin-tazobactam (ZOSYN)  IV 3.375 g (10/21/17 1137)     LOS: 9 days    Time spent: 25 minutes. Greater than 50% of this time was spent in direct contact with the patient coordinating care.  Irwin Brakeman, MD [facility] Pager [phone]  If 7PM-7AM, please contact night-coverage [email or web address] Password [facility] 10/21/2017, 3:19 PM

## 2017-10-21 NOTE — Progress Notes (Signed)
Subjective: She continues on high flow nasal oxygen.  She has tolerated 10 L but part of the problem is that she keeps pulling her oxygen off entirely.  She is back on 15 L now but this will be tapered through the day.  She has no new complaints.  Objective: Vital signs in last 24 hours: Temp:  [98.4 F (36.9 C)-98.7 F (37.1 C)] 98.5 F (36.9 C) (11/27 0607) Pulse Rate:  [75-82] 82 (11/27 0607) Resp:  [18-20] 18 (11/27 9233) BP: (124-137)/(66-81) 129/76 (11/27 0607) SpO2:  [74 %-99 %] 84 % (11/27 0727) Weight change:  Last BM Date: (Pt states she forgot, pt states its been 5 days or so)  Intake/Output from previous day: 11/26 0701 - 11/27 0700 In: 940 [P.O.:940] Out: 1400 [Urine:1400]  PHYSICAL EXAM General appearance: alert and moderate distress Resp: rhonchi bilaterally Cardio: regular rate and rhythm, S1, S2 normal, no murmur, click, rub or gallop GI: soft, non-tender; bowel sounds normal; no masses,  no organomegaly Extremities: extremities normal, atraumatic, no cyanosis or edema Skin warm and dry.  Flat affect  Lab Results:  No results found for this or any previous visit (from the past 48 hour(s)).  ABGS No results for input(s): PHART, PO2ART, TCO2, HCO3 in the last 72 hours.  Invalid input(s): PCO2 CULTURES Recent Results (from the past 240 hour(s))  Blood Culture (routine x 2)     Status: None   Collection Time: 10/12/17 10:00 AM  Result Value Ref Range Status   Specimen Description BLOOD RIGHT ANTECUBITAL DRAWN BY RN  Final   Special Requests   Final    BOTTLES DRAWN AEROBIC AND ANAEROBIC Blood Culture adequate volume   Culture NO GROWTH 5 DAYS  Final   Report Status 10/17/2017 FINAL  Final  Blood Culture (routine x 2)     Status: None   Collection Time: 10/12/17 10:00 AM  Result Value Ref Range Status   Specimen Description   Final    BLOOD RIGHT HAND BOTTLES DRAWN AEROBIC AND ANAEROBIC   Special Requests Blood Culture adequate volume  Final   Culture  NO GROWTH 5 DAYS  Final   Report Status 10/17/2017 FINAL  Final  MRSA PCR Screening     Status: None   Collection Time: 10/12/17  3:13 PM  Result Value Ref Range Status   MRSA by PCR NEGATIVE NEGATIVE Final    Comment:        The GeneXpert MRSA Assay (FDA approved for NASAL specimens only), is one component of a comprehensive MRSA colonization surveillance program. It is not intended to diagnose MRSA infection nor to guide or monitor treatment for MRSA infections.    Studies/Results: No results found.  Medications:  Prior to Admission:  Medications Prior to Admission  Medication Sig Dispense Refill Last Dose  . abacavir-lamiVUDine (EPZICOM) 600-300 MG per tablet Take 1 tablet by mouth daily.   10/12/2017 at Unknown time  . albuterol (PROVENTIL HFA;VENTOLIN HFA) 108 (90 Base) MCG/ACT inhaler Inhale 1-2 puffs into the lungs 2 (two) times daily as needed for wheezing or shortness of breath.   10/12/2017 at Unknown time  . atazanavir (REYATAZ) 200 MG capsule Take 400 mg by mouth daily.    10/12/2017 at Unknown time  . atorvastatin (LIPITOR) 10 MG tablet Take 10 mg by mouth 2 (two) times daily.   10/12/2017 at Unknown time  . divalproex (DEPAKOTE) 500 MG DR tablet Take 500 mg by mouth See admin instructions. Take 1 tablet (500 mg) by mouth  at 4pm and 8pm daily   10/11/2017 at Unknown time  . haloperidol (HALDOL) 10 MG tablet Take 20 mg by mouth 3 (three) times daily.    10/11/2017 at Unknown time  . hydrOXYzine (VISTARIL) 25 MG capsule Take 25 mg by mouth at bedtime.    10/12/2017 at Unknown time  . Magnesium Oxide 400 MG CAPS Take 1 capsule (400 mg total) by mouth every morning. 60 each 0 10/12/2017 at Unknown time  . polyethylene glycol (MIRALAX / GLYCOLAX) packet Take 17 g by mouth daily as needed.   unknown at unknown  . QUEtiapine (SEROQUEL) 200 MG tablet Take 100 mg by mouth See admin instructions. Take 1 tablet (200 mg) by mouth with supper and at bedtime daily   10/12/2017 at  Unknown time  . raltegravir (ISENTRESS) 400 MG tablet Take 400 mg by mouth 2 (two) times daily.   10/12/2017 at Unknown time  . valACYclovir (VALTREX) 1000 MG tablet Take 1,000 mg by mouth daily.    10/12/2017 at Unknown time  . zolpidem (AMBIEN) 10 MG tablet Take 5 mg by mouth at bedtime.    10/11/2017 at Unknown time  . haloperidol decanoate (HALDOL DECANOATE) 100 MG/ML injection Inject 200 mg into the muscle every 28 (twenty-eight) days. Last injection 06/09/15    Not Taking at Unknown time  . trihexyphenidyl (ARTANE) 2 MG tablet Take 2 mg by mouth 2 (two) times daily with a meal.   Taking   Scheduled: . abacavir  600 mg Oral Daily   And  . lamiVUDine  300 mg Oral Daily  . atazanavir  400 mg Oral Q breakfast  . atorvastatin  10 mg Oral BID  . divalproex  500 mg Oral 2 times per day  . enoxaparin (LOVENOX) injection  40 mg Subcutaneous Q24H  . guaiFENesin  1,200 mg Oral BID  . haloperidol  20 mg Oral TID  . hydrOXYzine  25 mg Oral QHS  . ipratropium-albuterol  3 mL Nebulization Q6H  . magnesium oxide  400 mg Oral Daily  . mouth rinse  15 mL Mouth Rinse BID  . methylPREDNISolone (SOLU-MEDROL) injection  40 mg Intravenous Q6H  . QUEtiapine  100 mg Oral 2 times per day  . raltegravir  400 mg Oral BID  . sulfamethoxazole-trimethoprim  2 tablet Oral TID  . trihexyphenidyl  2 mg Oral BID WC  . valACYclovir  1,000 mg Oral Daily  . zolpidem  5 mg Oral QHS   Continuous: . piperacillin-tazobactam (ZOSYN)  IV Stopped (10/21/17 0721)   XAJ:OINOMVEHMCNOB **OR** acetaminophen, albuterol, ibuprofen, ondansetron **OR** ondansetron (ZOFRAN) IV, polyethylene glycol  Assesment: She was admitted with healthcare associated pneumonia and sepsis she has acute hypoxic respiratory failure and she is still requiring high flow nasal oxygen.  Her echocardiogram is suggestive that she has some right ventricular strain or failure and I suspect that she is hypoxic at baseline.  She probably has COPD with her  smoking history although that has not been previously diagnosed.  Her pneumonia is slowly improving.  Her hypoxia is slowly improving.  Part of the problem is that she pulls her oxygen off and when she does that she develops hypoxia and it takes her a significant period of time to recover.  Her situation is complicated by her immunocompromise from HIV/AIDS with low CD4 count.  Her situation is also complicated by her mental illness. Active Problems:   Sepsis ([place])   Tobacco use disorder   HIV (human immunodeficiency virus infection) ([place])  Schizophrenia ([place])   HCAP (healthcare-associated pneumonia)   Acute respiratory failure with hypoxia ([place])   AIDS ([place])    Plan: Continue treatments.  She is on IV antibiotics IV steroids inhaled bronchodilators high flow nasal oxygen.  Discussed with Dr. Wynetta Emery hospitalist attending and we both agree that there is not much else to add at this point but continue working with her to try to get her off of such high flow oxygen.    LOS: 9 days   Vivian Neuwirth L 10/21/2017, 8:18 AM

## 2017-10-21 NOTE — Progress Notes (Signed)
Purewick catheter placed on pt d/t her stating she feels weak. Pt usually one assist, but RN can not get pt to [facility] x1 assist. Pt states she "feels like she's gonna fall." NT in room to help RN to [facility]. After seeing how weak pt (received multiple sedating meds last night) Purewick placed. Will continue to monitor pt

## 2017-10-21 NOTE — Progress Notes (Signed)
Pharmacy Antibiotic Note  Zurri Rudden is a 56 y.o. female admitted on 10/12/2017 with aspiration pneumonia.  Pharmacy has been consulted for ZOSYN dosing.  Plan: Zosyn 3.375gm IV q8h, EID Monitor labs, progress, c/s  Height: 5\' 11"  (180.3 cm) Weight: 238 lb 12.1 oz (108.3 kg) IBW/kg (Calculated) : 70.8  Temp (24hrs), Avg:98.5 F (36.9 C), Min:98.4 F (36.9 C), Max:98.7 F (37.1 C)  Recent Labs  Lab 10/16/17 0439 10/18/17 0501 10/19/17 0717  WBC 5.3 7.1  --   CREATININE 0.91 1.17* 1.04*    Estimated Creatinine Clearance: 83.8 mL/min (A) (by C-G formula based on SCr of 1.04 mg/dL (H)).    Allergies  Allergen Reactions  . Asa [Aspirin] Nausea And Vomiting   Antimicrobials this admission: Zosyn 11/24 >>   Dose adjustments this admission:  Microbiology results:  BCx: ng final   Sputum:    MRSA PCR: (-)  Thank you for allowing pharmacy to be a part of this patient's care.  Excell Seltzer [place] 10/21/2017 9:44 AM

## 2017-10-22 LAB — PNEUMOCYSTIS JIROVECI SMEAR BY DFA

## 2017-10-22 NOTE — Progress Notes (Signed)
Subjective: She says she feels okay.  No new complaints.  She is still on high flow nasal cannula.  Objective: Vital signs in last 24 hours: Temp:  [98.3 F (36.8 C)-99 F (37.2 C)] 98.3 F (36.8 C) (11/28 0548) Pulse Rate:  [75-91] 91 (11/28 0548) Resp:  [16-18] 18 (11/28 0548) BP: (139-144)/(59-97) 139/97 (11/28 0548) SpO2:  [73 %-96 %] 96 % (11/28 0548) Weight change:  Last BM Date: (Pt states she forgot, pt states its been 5 days or so)  Intake/Output from previous day: 11/27 0701 - 11/28 0700 In: 360 [P.O.:360] Out: 920 [Urine:920]  PHYSICAL EXAM General appearance: alert, cooperative and no distress Resp: rhonchi bilaterally Cardio: regular rate and rhythm, S1, S2 normal, no murmur, click, rub or gallop GI: soft, non-tender; bowel sounds normal; no masses,  no organomegaly Extremities: extremities normal, atraumatic, no cyanosis or edema Flat affect  Lab Results:  No results found for this or any previous visit (from the past 48 hour(s)).  ABGS No results for input(s): PHART, PO2ART, TCO2, HCO3 in the last 72 hours.  Invalid input(s): PCO2 CULTURES Recent Results (from the past 240 hour(s))  Blood Culture (routine x 2)     Status: None   Collection Time: 10/12/17 10:00 AM  Result Value Ref Range Status   Specimen Description BLOOD RIGHT ANTECUBITAL DRAWN BY RN  Final   Special Requests   Final    BOTTLES DRAWN AEROBIC AND ANAEROBIC Blood Culture adequate volume   Culture NO GROWTH 5 DAYS  Final   Report Status 10/17/2017 FINAL  Final  Blood Culture (routine x 2)     Status: None   Collection Time: 10/12/17 10:00 AM  Result Value Ref Range Status   Specimen Description   Final    BLOOD RIGHT HAND BOTTLES DRAWN AEROBIC AND ANAEROBIC   Special Requests Blood Culture adequate volume  Final   Culture NO GROWTH 5 DAYS  Final   Report Status 10/17/2017 FINAL  Final  MRSA PCR Screening     Status: None   Collection Time: 10/12/17  3:13 PM  Result Value Ref Range  Status   MRSA by PCR NEGATIVE NEGATIVE Final    Comment:        The GeneXpert MRSA Assay (FDA approved for NASAL specimens only), is one component of a comprehensive MRSA colonization surveillance program. It is not intended to diagnose MRSA infection nor to guide or monitor treatment for MRSA infections.    Studies/Results: No results found.  Medications:  Prior to Admission:  Medications Prior to Admission  Medication Sig Dispense Refill Last Dose  . abacavir-lamiVUDine (EPZICOM) 600-300 MG per tablet Take 1 tablet by mouth daily.   10/12/2017 at Unknown time  . albuterol (PROVENTIL HFA;VENTOLIN HFA) 108 (90 Base) MCG/ACT inhaler Inhale 1-2 puffs into the lungs 2 (two) times daily as needed for wheezing or shortness of breath.   10/12/2017 at Unknown time  . atazanavir (REYATAZ) 200 MG capsule Take 400 mg by mouth daily.    10/12/2017 at Unknown time  . atorvastatin (LIPITOR) 10 MG tablet Take 10 mg by mouth 2 (two) times daily.   10/12/2017 at Unknown time  . divalproex (DEPAKOTE) 500 MG DR tablet Take 500 mg by mouth See admin instructions. Take 1 tablet (500 mg) by mouth at 4pm and 8pm daily   10/11/2017 at Unknown time  . haloperidol (HALDOL) 10 MG tablet Take 20 mg by mouth 3 (three) times daily.    10/11/2017 at Unknown time  .  hydrOXYzine (VISTARIL) 25 MG capsule Take 25 mg by mouth at bedtime.    10/12/2017 at Unknown time  . Magnesium Oxide 400 MG CAPS Take 1 capsule (400 mg total) by mouth every morning. 60 each 0 10/12/2017 at Unknown time  . polyethylene glycol (MIRALAX / GLYCOLAX) packet Take 17 g by mouth daily as needed.   unknown at unknown  . QUEtiapine (SEROQUEL) 200 MG tablet Take 100 mg by mouth See admin instructions. Take 1 tablet (200 mg) by mouth with supper and at bedtime daily   10/12/2017 at Unknown time  . raltegravir (ISENTRESS) 400 MG tablet Take 400 mg by mouth 2 (two) times daily.   10/12/2017 at Unknown time  . valACYclovir (VALTREX) 1000 MG tablet  Take 1,000 mg by mouth daily.    10/12/2017 at Unknown time  . zolpidem (AMBIEN) 10 MG tablet Take 5 mg by mouth at bedtime.    10/11/2017 at Unknown time  . haloperidol decanoate (HALDOL DECANOATE) 100 MG/ML injection Inject 200 mg into the muscle every 28 (twenty-eight) days. Last injection 06/09/15    Not Taking at Unknown time  . trihexyphenidyl (ARTANE) 2 MG tablet Take 2 mg by mouth 2 (two) times daily with a meal.   Taking   Scheduled: . abacavir  600 mg Oral Daily   And  . lamiVUDine  300 mg Oral Daily  . amoxicillin-clavulanate  1 tablet Oral Q12H  . atazanavir  400 mg Oral Q breakfast  . atorvastatin  10 mg Oral BID  . divalproex  500 mg Oral 2 times per day  . enoxaparin (LOVENOX) injection  40 mg Subcutaneous Q24H  . guaiFENesin  1,200 mg Oral BID  . haloperidol  20 mg Oral TID  . hydrOXYzine  25 mg Oral QHS  . ipratropium-albuterol  3 mL Nebulization TID  . magnesium oxide  400 mg Oral Daily  . mouth rinse  15 mL Mouth Rinse BID  . methylPREDNISolone (SOLU-MEDROL) injection  40 mg Intravenous Q6H  . QUEtiapine  100 mg Oral 2 times per day  . raltegravir  400 mg Oral BID  . sulfamethoxazole-trimethoprim  2 tablet Oral TID  . trihexyphenidyl  2 mg Oral BID WC  . valACYclovir  1,000 mg Oral Daily  . zolpidem  5 mg Oral QHS   Continuous:  JSH:FWYOVZCHYIFOY **OR** acetaminophen, albuterol, ibuprofen, ondansetron **OR** ondansetron (ZOFRAN) IV, polyethylene glycol  Assesment: She was admitted with healthcare associated pneumonia and sepsis.  She is on IV antibiotics and IV steroids.  She has COPD exacerbation.  Her situation is complicated by schizophrenia and by the fact that she has HIV/AIDS with low CD4 count.  She is still requiring high flow nasal oxygen. Active Problems:   Sepsis ([place])   Tobacco use disorder   HIV (human immunodeficiency virus infection) ([place])   Schizophrenia ([place])   HCAP (healthcare-associated pneumonia)   Acute respiratory failure with hypoxia  ([place])   AIDS ([place])   COPD exacerbation ([place])    Plan: Discussed with respiratory therapy today.  They will try to reduce her oxygen.    LOS: 10 days   Karina Clarke L 10/22/2017, 8:51 AM

## 2017-10-22 NOTE — Progress Notes (Addendum)
Lying in bed this morning with nasal canula off 02 sat was  75%.  Placed back on canula at 10 liters was 97%.  Later assisted up to chair and at this time is 90-92% on 7 liters,   On 6 liters dropped to 85%

## 2017-10-22 NOTE — Care Management Note (Signed)
Case Management Note  Patient Details  Name: Karina Clarke MRN: [id number] Date of Birth: 1961/06/03  If discussed at [place] Length of Stay Meetings, dates discussed:  10/21/2017  Additional Comments: Dr. Luan Pulling inquiring about LTAC due to inability to wean from [place]. [facility] rep says they do not accept medicaid only. [facility] rep says they will consider medicaid only but not at this time. Bedside RN attempting to wean pt. Initially pt did not have chronic resp dx to qualify for home oxygen. Now with dx of COPD, pt will qualify.   Sherald Barge, RN 10/22/2017, 11:09 AM

## 2017-10-22 NOTE — Progress Notes (Addendum)
In bed and 92% on 9 liters.  Mr. Karina Clarke, manager of group home in to visit and he is aware that she will need to have oxygen.  He said that she smokes a pack a day.

## 2017-10-22 NOTE — Progress Notes (Signed)
PROGRESS NOTE   Karina Clarke  IWP:[id number] DOB: 12-24-1960 DOA: 10/12/2017 PCP: Rosita Fire, MD   Brief Narrative:  56 year old woman admitted from a group home on 11/18 due to shortness of breath.  Found to have extensive bilateral pneumonia, still remains with very high oxygen requirements.  Past medical history significant for schizophrenia and HIV/AIDS.  Assessment & Plan:   Active Problems:   Sepsis ([place])   Tobacco use disorder   HIV (human immunodeficiency virus infection) ([place])   Schizophrenia ([place])   HCAP (healthcare-associated pneumonia)   Acute respiratory failure with hypoxia ([place])   AIDS ([place])   COPD exacerbation ([place])   Acute hypoxemic respiratory failure -Mainly due to community-acquired pneumonia, possibly small component of mild pulmonary edema from aggressive volume resuscitation. -Echo shows EF of 65-70% with grade 1 diastolic dysfunction and no wall motion abnormalities.  No schedule Lasix has been ordered although she has received intermittent doses of Lasix. -Still requiring 10 L of oxygen via high flow nasal cannula but trying to wean down -CT angios without PE although she does have dependent consolidation within bilateral lower lobes and right upper lobe. -Influenza PCR screen is negative. -Remains on Zosyn and IV steroids. -Dr. Luan Pulling is following. -Working on weaning oxygen as tolerated. Pt has not been cooperative with keeping on oxygen which has been difficult to wean down.    Community-acquired pneumonia -Initially started on vancomycin and cefepime and transitioned over to Augmentin on 11/19. -Started Zosyn on 11/24 and she is improving clinically.  Hopefully can further de-escalate 11/27.  -She is also being treated for PCP pneumonia given her HIV with CD4 count of 150.  Continue Bactrim and steroids. -PCP smear has been requested.  HIV/AIDS -Last known CD4 count was 150 in September 2018, she follows outpatient with ID at  [facility]. -Continue antiretrovirals.  Schizophrenia -Mood is stable, continue current psychotropic medications.  DVT prophylaxis: Lovenox Code Status: Full code Family Communication: Patient only Disposition Plan: Back to group home on oxygen   Consultants:   Pulmonary, Dr. Luan Pulling  Procedures:   Echo with results as above  Antimicrobials:  Anti-infectives (From admission, onward)   Start     Dose/Rate Route Frequency Ordered Stop   10/22/17 1000  amoxicillin-clavulanate (AUGMENTIN) 875-125 MG per tablet 1 tablet     1 tablet Oral Every 12 hours 10/21/17 1521     10/18/17 1100  piperacillin-tazobactam (ZOSYN) IVPB 3.375 g     3.375 g 12.5 mL/hr over 240 Minutes Intravenous Every 8 hours 10/18/17 1039 10/22/17 0642   10/15/17 1600  sulfamethoxazole-trimethoprim (BACTRIM DS,SEPTRA DS) 800-160 MG per tablet 3 tablet  Status:  Discontinued     3 tablet Oral 3 times daily 10/15/17 1008 10/15/17 1017   10/15/17 1600  sulfamethoxazole-trimethoprim (BACTRIM DS,SEPTRA DS) 800-160 MG per tablet 2 tablet     2 tablet Oral 3 times daily 10/15/17 1017     10/15/17 1030  sulfamethoxazole-trimethoprim (BACTRIM DS,SEPTRA DS) 800-160 MG per tablet 1 tablet     1 tablet Oral  Once 10/15/17 1018 10/15/17 1045   10/14/17 1215  sulfamethoxazole-trimethoprim (BACTRIM DS,SEPTRA DS) 800-160 MG per tablet 1 tablet  Status:  Discontinued     1 tablet Oral Every 12 hours 10/14/17 1214 10/15/17 1008   10/13/17 2200  amoxicillin-clavulanate (AUGMENTIN) 875-125 MG per tablet 1 tablet  Status:  Discontinued     1 tablet Oral Every 12 hours 10/13/17 1750 10/18/17 1032   10/13/17 1000  abacavir-lamiVUDine (EPZICOM) 600-300 MG per tablet 1  tablet  Status:  Discontinued     1 tablet Oral Daily 10/12/17 1634 10/13/17 0824   10/13/17 1000  valACYclovir (VALTREX) tablet 1,000 mg     1,000 mg Oral Daily 10/12/17 1634     10/13/17 1000  abacavir (ZIAGEN) tablet 600 mg     600 mg Oral Daily 10/13/17 0824      10/13/17 1000  lamiVUDine (EPIVIR) tablet 300 mg     300 mg Oral Daily 10/13/17 0824     10/13/17 0800  atazanavir (REYATAZ) capsule 400 mg     400 mg Oral Daily with breakfast 10/12/17 1634     10/13/17 0100  ceFEPIme (MAXIPIME) 1 g in dextrose 5 % 50 mL IVPB  Status:  Discontinued     1 g 100 mL/hr over 30 Minutes Intravenous Every 8 hours 10/12/17 1732 10/13/17 1750   10/12/17 2200  vancomycin (VANCOCIN) IVPB 750 mg/150 ml premix  Status:  Discontinued     750 mg 150 mL/hr over 60 Minutes Intravenous Every 12 hours 10/12/17 1258 10/12/17 1634   10/12/17 2200  raltegravir (ISENTRESS) tablet 400 mg     400 mg Oral 2 times daily 10/12/17 1634     10/12/17 1700  piperacillin-tazobactam (ZOSYN) IVPB 3.375 g  Status:  Discontinued     3.375 g 12.5 mL/hr over 240 Minutes Intravenous Every 8 hours 10/12/17 1258 10/12/17 1634   10/12/17 1700  ceFEPIme (MAXIPIME) 2 g in dextrose 5 % 50 mL IVPB     2 g 100 mL/hr over 30 Minutes Intravenous  Once 10/12/17 1634 10/12/17 1733   10/12/17 1645  vancomycin (VANCOCIN) IVPB 1000 mg/200 mL premix  Status:  Discontinued     1,000 mg 200 mL/hr over 60 Minutes Intravenous  Once 10/12/17 1634 10/12/17 1657   10/12/17 1230  vancomycin (VANCOCIN) IVPB 1000 mg/200 mL premix     1,000 mg 200 mL/hr over 60 Minutes Intravenous  Once 10/12/17 1229 10/12/17 1404   10/12/17 1030  piperacillin-tazobactam (ZOSYN) IVPB 3.375 g     3.375 g 100 mL/hr over 30 Minutes Intravenous  Once 10/12/17 1025 10/12/17 1113   10/12/17 1015  sulfamethoxazole-trimethoprim (BACTRIM DS,SEPTRA DS) 800-160 MG per tablet 1 tablet     1 tablet Oral  Once 10/12/17 1003 10/12/17 1020   10/12/17 1000  cefTRIAXone (ROCEPHIN) 1 g in dextrose 5 % 50 mL IVPB  Status:  Discontinued     1 g 100 mL/hr over 30 Minutes Intravenous  Once 10/12/17 0951 10/12/17 0952   10/12/17 1000  azithromycin (ZITHROMAX) 500 mg in dextrose 5 % 250 mL IVPB  Status:  Discontinued     500 mg 250 mL/hr over 60 Minutes  Intravenous  Once 10/12/17 0951 10/12/17 0952   10/12/17 1000  vancomycin (VANCOCIN) IVPB 1000 mg/200 mL premix     1,000 mg 200 mL/hr over 60 Minutes Intravenous  Once 10/12/17 0954 10/12/17 1139   10/12/17 1000  piperacillin-tazobactam (ZOSYN) IVPB 2.25 g  Status:  Discontinued     2.25 g 100 mL/hr over 30 Minutes Intravenous  Once 10/12/17 0954 10/12/17 1025     Subjective: States she is feeling a lot better and keeps taking her oxygen off.     Objective: Vitals:   10/22/17 1130 10/22/17 1145 10/22/17 1200 10/22/17 1215  BP:      Pulse:      Resp:      Temp:      TempSrc:      SpO2: (!) 75%  95% (!) 85% 92%  Weight:      Height:        Intake/Output Summary (Last 24 hours) at 10/22/2017 1248 Last data filed at 10/22/2017 0500 Gross per 24 hour  Intake -  Output 820 ml  Net -820 ml   Filed Weights   10/12/17 1200 10/12/17 1506  Weight: 99.8 kg (220 lb) 108.3 kg (238 lb 12.1 oz)    Examination:  General exam: Alert, awake, oriented x 3 Respiratory system: BBS with occasional rhonchi heard.  Cardiovascular system: normal s1, s2 sounds.  No murmurs, rubs, gallops. Gastrointestinal system: Abdomen is nondistended, soft and nontender. No organomegaly or masses felt. Normal bowel sounds heard. Central nervous system:  No focal neurological deficits. Extremities: No C/C/E, +pedal pulses Skin: No rashes, lesions or ulcers Psychiatry: flat affect.   Data Reviewed: I have personally reviewed following labs and imaging studies  CBC: Recent Labs  Lab 10/16/17 0439 10/18/17 0501  WBC 5.3 7.1  HGB 11.3* 12.1  HCT 37.5 39.2  MCV 100.8* 98.5  PLT 136* 193   Basic Metabolic Panel: Recent Labs  Lab 10/16/17 0439 10/18/17 0501 10/19/17 0717  NA 138 137  --   K 5.0 4.6  --   CL 104 97*  --   CO2 29 30  --   GLUCOSE 163* 253*  --   BUN 14 27*  --   CREATININE 0.91 1.17* 1.04*  CALCIUM 8.5* 9.1  --    GFR: Estimated Creatinine Clearance: 83.8 mL/min (A) (by C-G  formula based on SCr of 1.04 mg/dL (H)). Liver Function Tests: No results for input(s): AST, ALT, ALKPHOS, BILITOT, PROT, ALBUMIN in the last 168 hours. No results for input(s): LIPASE, AMYLASE in the last 168 hours. No results for input(s): AMMONIA in the last 168 hours. Coagulation Profile: No results for input(s): INR, PROTIME in the last 168 hours. Cardiac Enzymes: No results for input(s): CKTOTAL, CKMB, CKMBINDEX, TROPONINI in the last 168 hours. BNP (last 3 results) No results for input(s): PROBNP in the last 8760 hours. HbA1C: No results for input(s): HGBA1C in the last 72 hours. CBG: No results for input(s): GLUCAP in the last 168 hours. Lipid Profile: No results for input(s): CHOL, HDL, LDLCALC, TRIG, CHOLHDL, LDLDIRECT in the last 72 hours. Thyroid Function Tests: No results for input(s): TSH, T4TOTAL, FREET4, T3FREE, THYROIDAB in the last 72 hours. Anemia Panel: No results for input(s): VITAMINB12, FOLATE, FERRITIN, TIBC, IRON, RETICCTPCT in the last 72 hours. Urine analysis:    Component Value Date/Time   COLORURINE YELLOW 10/12/2017 [place] 10/12/2017 0944   LABSPEC 1.017 10/12/2017 [place] 5.0 10/12/2017 [place] 10/12/2017 [place] 10/12/2017 [place] 10/12/2017 [place] 10/12/2017 [place] NEGATIVE 10/12/2017 0944   NITRITE NEGATIVE 10/12/2017 0944   LEUKOCYTESUR NEGATIVE 10/12/2017 0944    Recent Results (from the past 240 hour(s))  MRSA PCR Screening     Status: None   Collection Time: 10/12/17  3:13 PM  Result Value Ref Range Status   MRSA by PCR NEGATIVE NEGATIVE Final    Comment:        The GeneXpert MRSA Assay (FDA approved for NASAL specimens only), is one component of a comprehensive MRSA colonization surveillance program. It is not intended to diagnose MRSA infection nor to guide or monitor treatment for MRSA infections.   Pneumocystis smear  by DFA  Status: None   Collection Time: 10/16/17 10:59 AM  Result Value Ref Range Status   Specimen Source-PJSRC SPUTUM  Final   Pneumocystis jiroveci Ag SEE SEPARATE REPORT  Final    Radiology Studies: No results found.  Scheduled Meds: . abacavir  600 mg Oral Daily   And  . lamiVUDine  300 mg Oral Daily  . amoxicillin-clavulanate  1 tablet Oral Q12H  . atazanavir  400 mg Oral Q breakfast  . atorvastatin  10 mg Oral BID  . divalproex  500 mg Oral 2 times per day  . enoxaparin (LOVENOX) injection  40 mg Subcutaneous Q24H  . guaiFENesin  1,200 mg Oral BID  . haloperidol  20 mg Oral TID  . hydrOXYzine  25 mg Oral QHS  . ipratropium-albuterol  3 mL Nebulization TID  . magnesium oxide  400 mg Oral Daily  . mouth rinse  15 mL Mouth Rinse BID  . methylPREDNISolone (SOLU-MEDROL) injection  40 mg Intravenous Q6H  . QUEtiapine  100 mg Oral 2 times per day  . raltegravir  400 mg Oral BID  . sulfamethoxazole-trimethoprim  2 tablet Oral TID  . trihexyphenidyl  2 mg Oral BID WC  . valACYclovir  1,000 mg Oral Daily  . zolpidem  5 mg Oral QHS   Continuous Infusions:    LOS: 10 days    Time spent: 25 minutes. Greater than 50% of this time was spent in direct contact with the patient coordinating care.  Irwin Brakeman, MD [facility] Pager [phone]  If 7PM-7AM, please contact night-coverage [email or web address] Password [facility] 10/22/2017, 12:48 PM

## 2017-10-23 NOTE — Progress Notes (Signed)
PROGRESS NOTE   Karina Clarke  EXN:[id number] DOB: 04/19/1961 DOA: 10/12/2017 PCP: Rosita Fire, MD   Brief Narrative:  56 year old woman admitted from a group home on 11/18 due to shortness of breath.  Found to have extensive bilateral pneumonia, still remains with very high oxygen requirements.  Past medical history significant for schizophrenia and HIV/AIDS.  Assessment & Plan:   Active Problems:   Sepsis ([place])   Tobacco use disorder   HIV (human immunodeficiency virus infection) ([place])   Schizophrenia ([place])   HCAP (healthcare-associated pneumonia)   Acute respiratory failure with hypoxia ([place])   AIDS ([place])   COPD exacerbation ([place])   Acute hypoxemic respiratory failure -Mainly due to community-acquired pneumonia, possibly small component of mild pulmonary edema from aggressive volume resuscitation. -Echo shows EF of 65-70% with grade 1 diastolic dysfunction and no wall motion abnormalities.  No scheduled Lasix has been ordered although she has received intermittent doses of Lasix. -Still requiring 10 L of oxygen via high flow nasal cannula but trying to wean down -CT angio without PE although she does have dependent consolidation within bilateral lower lobes and right upper lobe. -Influenza PCR screen is negative. -Continue Augmentin and IV steroids. -Dr. Luan Pulling is following. -Working on weaning oxygen as tolerated.   Community-acquired pneumonia -Initially started on vancomycin and cefepime and transitioned over to Augmentin on 11/19. -Started Zosyn on 11/24 and she is improving clinically.   -Is now back on Augmentin. -She is also being treated for PCP pneumonia given her HIV with CD4 count of 150.  Continue Bactrim and steroids.  HIV/AIDS -Last known CD4 count was 150 in September 2018, she follows outpatient with ID at [facility]. -Continue antiretrovirals.  Schizophrenia -Mood is stable, continue current psychotropic medications.  DVT prophylaxis: Lovenox Code  Status: Full code Family Communication: Patient only Disposition Plan: Back to group home on oxygen   Consultants:   Pulmonary, Dr. Luan Pulling  Procedures:   Echo with results as above  Antimicrobials:  Anti-infectives (From admission, onward)   Start     Dose/Rate Route Frequency Ordered Stop   10/22/17 1000  amoxicillin-clavulanate (AUGMENTIN) 875-125 MG per tablet 1 tablet     1 tablet Oral Every 12 hours 10/21/17 1521     10/18/17 1100  piperacillin-tazobactam (ZOSYN) IVPB 3.375 g     3.375 g 12.5 mL/hr over 240 Minutes Intravenous Every 8 hours 10/18/17 1039 10/22/17 0642   10/15/17 1600  sulfamethoxazole-trimethoprim (BACTRIM DS,SEPTRA DS) 800-160 MG per tablet 3 tablet  Status:  Discontinued     3 tablet Oral 3 times daily 10/15/17 1008 10/15/17 1017   10/15/17 1600  sulfamethoxazole-trimethoprim (BACTRIM DS,SEPTRA DS) 800-160 MG per tablet 2 tablet     2 tablet Oral 3 times daily 10/15/17 1017     10/15/17 1030  sulfamethoxazole-trimethoprim (BACTRIM DS,SEPTRA DS) 800-160 MG per tablet 1 tablet     1 tablet Oral  Once 10/15/17 1018 10/15/17 1045   10/14/17 1215  sulfamethoxazole-trimethoprim (BACTRIM DS,SEPTRA DS) 800-160 MG per tablet 1 tablet  Status:  Discontinued     1 tablet Oral Every 12 hours 10/14/17 1214 10/15/17 1008   10/13/17 2200  amoxicillin-clavulanate (AUGMENTIN) 875-125 MG per tablet 1 tablet  Status:  Discontinued     1 tablet Oral Every 12 hours 10/13/17 1750 10/18/17 1032   10/13/17 1000  abacavir-lamiVUDine (EPZICOM) 600-300 MG per tablet 1 tablet  Status:  Discontinued     1 tablet Oral Daily 10/12/17 1634 10/13/17 0824   10/13/17 1000  valACYclovir (  VALTREX) tablet 1,000 mg     1,000 mg Oral Daily 10/12/17 1634     10/13/17 1000  abacavir (ZIAGEN) tablet 600 mg     600 mg Oral Daily 10/13/17 0824     10/13/17 1000  lamiVUDine (EPIVIR) tablet 300 mg     300 mg Oral Daily 10/13/17 0824     10/13/17 0800  atazanavir (REYATAZ) capsule 400 mg     400 mg  Oral Daily with breakfast 10/12/17 1634     10/13/17 0100  ceFEPIme (MAXIPIME) 1 g in dextrose 5 % 50 mL IVPB  Status:  Discontinued     1 g 100 mL/hr over 30 Minutes Intravenous Every 8 hours 10/12/17 1732 10/13/17 1750   10/12/17 2200  vancomycin (VANCOCIN) IVPB 750 mg/150 ml premix  Status:  Discontinued     750 mg 150 mL/hr over 60 Minutes Intravenous Every 12 hours 10/12/17 1258 10/12/17 1634   10/12/17 2200  raltegravir (ISENTRESS) tablet 400 mg     400 mg Oral 2 times daily 10/12/17 1634     10/12/17 1700  piperacillin-tazobactam (ZOSYN) IVPB 3.375 g  Status:  Discontinued     3.375 g 12.5 mL/hr over 240 Minutes Intravenous Every 8 hours 10/12/17 1258 10/12/17 1634   10/12/17 1700  ceFEPIme (MAXIPIME) 2 g in dextrose 5 % 50 mL IVPB     2 g 100 mL/hr over 30 Minutes Intravenous  Once 10/12/17 1634 10/12/17 1733   10/12/17 1645  vancomycin (VANCOCIN) IVPB 1000 mg/200 mL premix  Status:  Discontinued     1,000 mg 200 mL/hr over 60 Minutes Intravenous  Once 10/12/17 1634 10/12/17 1657   10/12/17 1230  vancomycin (VANCOCIN) IVPB 1000 mg/200 mL premix     1,000 mg 200 mL/hr over 60 Minutes Intravenous  Once 10/12/17 1229 10/12/17 1404   10/12/17 1030  piperacillin-tazobactam (ZOSYN) IVPB 3.375 g     3.375 g 100 mL/hr over 30 Minutes Intravenous  Once 10/12/17 1025 10/12/17 1113   10/12/17 1015  sulfamethoxazole-trimethoprim (BACTRIM DS,SEPTRA DS) 800-160 MG per tablet 1 tablet     1 tablet Oral  Once 10/12/17 1003 10/12/17 1020   10/12/17 1000  cefTRIAXone (ROCEPHIN) 1 g in dextrose 5 % 50 mL IVPB  Status:  Discontinued     1 g 100 mL/hr over 30 Minutes Intravenous  Once 10/12/17 0951 10/12/17 0952   10/12/17 1000  azithromycin (ZITHROMAX) 500 mg in dextrose 5 % 250 mL IVPB  Status:  Discontinued     500 mg 250 mL/hr over 60 Minutes Intravenous  Once 10/12/17 0951 10/12/17 0952   10/12/17 1000  vancomycin (VANCOCIN) IVPB 1000 mg/200 mL premix     1,000 mg 200 mL/hr over 60 Minutes  Intravenous  Once 10/12/17 0954 10/12/17 1139   10/12/17 1000  piperacillin-tazobactam (ZOSYN) IVPB 2.25 g  Status:  Discontinued     2.25 g 100 mL/hr over 30 Minutes Intravenous  Once 10/12/17 0954 10/12/17 1025     Subjective: States she feels improved.  Objective: Vitals:   10/23/17 1000 10/23/17 1024 10/23/17 1404 10/23/17 1445  BP:    126/90  Pulse:    (!) 102  Resp:    18  Temp:    97.8 F (36.6 C)  TempSrc:    Oral  SpO2: 90% 90% 91% 91%  Weight:      Height:        Intake/Output Summary (Last 24 hours) at 10/23/2017 1759 Last data filed at 10/23/2017 1447  Gross per 24 hour  Intake 820 ml  Output 900 ml  Net -80 ml   Filed Weights   10/12/17 1200 10/12/17 1506  Weight: 99.8 kg (220 lb) 108.3 kg (238 lb 12.1 oz)    Examination:  General exam: Alert, awake, oriented x 3 Respiratory system: BBS with occasional rhonchi heard.  Cardiovascular system: normal s1, s2 sounds.  No murmurs, rubs, gallops. Gastrointestinal system: Abdomen is nondistended, soft and nontender. No organomegaly or masses felt. Normal bowel sounds heard. Central nervous system:  No focal neurological deficits. Extremities: No C/C/E, +pedal pulses Skin: No rashes, lesions or ulcers Psychiatry: flat affect.   Data Reviewed: I have personally reviewed following labs and imaging studies  CBC: Recent Labs  Lab 10/18/17 0501  WBC 7.1  HGB 12.1  HCT 39.2  MCV 98.5  PLT 761   Basic Metabolic Panel: Recent Labs  Lab 10/18/17 0501 10/19/17 0717  NA 137  --   K 4.6  --   CL 97*  --   CO2 30  --   GLUCOSE 253*  --   BUN 27*  --   CREATININE 1.17* 1.04*  CALCIUM 9.1  --    GFR: Estimated Creatinine Clearance: 83.8 mL/min (A) (by C-G formula based on SCr of 1.04 mg/dL (H)). Liver Function Tests: No results for input(s): AST, ALT, ALKPHOS, BILITOT, PROT, ALBUMIN in the last 168 hours. No results for input(s): LIPASE, AMYLASE in the last 168 hours. No results for input(s): AMMONIA  in the last 168 hours. Coagulation Profile: No results for input(s): INR, PROTIME in the last 168 hours. Cardiac Enzymes: No results for input(s): CKTOTAL, CKMB, CKMBINDEX, TROPONINI in the last 168 hours. BNP (last 3 results) No results for input(s): PROBNP in the last 8760 hours. HbA1C: No results for input(s): HGBA1C in the last 72 hours. CBG: No results for input(s): GLUCAP in the last 168 hours. Lipid Profile: No results for input(s): CHOL, HDL, LDLCALC, TRIG, CHOLHDL, LDLDIRECT in the last 72 hours. Thyroid Function Tests: No results for input(s): TSH, T4TOTAL, FREET4, T3FREE, THYROIDAB in the last 72 hours. Anemia Panel: No results for input(s): VITAMINB12, FOLATE, FERRITIN, TIBC, IRON, RETICCTPCT in the last 72 hours. Urine analysis:    Component Value Date/Time   COLORURINE YELLOW 10/12/2017 [place] 10/12/2017 0944   LABSPEC 1.017 10/12/2017 0944   PHURINE 5.0 10/12/2017 [place] 10/12/2017 [place] 10/12/2017 [place] 10/12/2017 [place] 10/12/2017 [place] NEGATIVE 10/12/2017 0944   NITRITE NEGATIVE 10/12/2017 0944   LEUKOCYTESUR NEGATIVE 10/12/2017 0944    Recent Results (from the past 240 hour(s))  Pneumocystis smear by DFA     Status: None   Collection Time: 10/16/17 10:59 AM  Result Value Ref Range Status   Specimen Source-PJSRC SPUTUM  Final   Pneumocystis jiroveci Ag SEE SEPARATE REPORT  Final    Radiology Studies: No results found.  Scheduled Meds: . abacavir  600 mg Oral Daily   And  . lamiVUDine  300 mg Oral Daily  . amoxicillin-clavulanate  1 tablet Oral Q12H  . atazanavir  400 mg Oral Q breakfast  . atorvastatin  10 mg Oral BID  . divalproex  500 mg Oral 2 times per day  . enoxaparin (LOVENOX) injection  40 mg Subcutaneous Q24H  . guaiFENesin  1,200 mg Oral BID  . haloperidol  20 mg Oral TID  . hydrOXYzine  25 mg Oral  QHS  . ipratropium-albuterol  3  mL Nebulization TID  . magnesium oxide  400 mg Oral Daily  . mouth rinse  15 mL Mouth Rinse BID  . methylPREDNISolone (SOLU-MEDROL) injection  40 mg Intravenous Q6H  . QUEtiapine  100 mg Oral 2 times per day  . raltegravir  400 mg Oral BID  . sulfamethoxazole-trimethoprim  2 tablet Oral TID  . trihexyphenidyl  2 mg Oral BID WC  . valACYclovir  1,000 mg Oral Daily  . zolpidem  5 mg Oral QHS   Continuous Infusions:    LOS: 11 days    Time spent: 25 minutes. Greater than 50% of this time was spent in direct contact with the patient coordinating care.  Lelon Frohlich, MD [facility] Pager [phone]  If 7PM-7AM, please contact night-coverage [email or web address] Password [email or web address] 10/23/2017, 5:59 PM

## 2017-10-23 NOTE — Progress Notes (Signed)
Assisted to bedside commode several times today.  02 continues to drop in 70's when nasal canula is off.

## 2017-10-23 NOTE — Progress Notes (Signed)
Telemetry has called several times today that patient is tacycardic in 140's. Upon assessment patient's nasal canula has been off and 02 sat in mid 70''s.s.   Received call just now that was sustained in 150's. Upon assessment canula was off and 02 sat was 75%.  Replaced canula and on 10 liters was 90% Vitals 98.9  115/76  103  20  .

## 2017-10-23 NOTE — Progress Notes (Signed)
Subjective: She has no new complaints.  No new problems noted.  She says she is not short of breath.  She has been able to come down to 9 L high flow nasal oxygen so she has improved.  She does continue to remove her oxygen mostly by accident.  She is not very active.  She has no other new complaints.  Objective: Vital signs in last 24 hours: Temp:  [97.4 F (36.3 C)-98.5 F (36.9 C)] 97.4 F (36.3 C) (11/29 0500) Pulse Rate:  [74-87] 74 (11/29 0500) Resp:  [18-20] 18 (11/29 0500) BP: (113-139)/(65-78) 128/71 (11/29 0500) SpO2:  [75 %-100 %] 88 % (11/29 0802) Weight change:  Last BM Date: 10/22/17  Intake/Output from previous day: 11/28 0701 - 11/29 0700 In: 1420 [P.O.:1420] Out: 1650 [Urine:1650]  PHYSICAL EXAM General appearance: alert and no distress Resp: rhonchi bilaterally Cardio: regular rate and rhythm, S1, S2 normal, no murmur, click, rub or gallop GI: soft, non-tender; bowel sounds normal; no masses,  no organomegaly Extremities: extremities normal, atraumatic, no cyanosis or edema Skin warm and dry  Lab Results:  No results found for this or any previous visit (from the past 48 hour(s)).  ABGS No results for input(s): PHART, PO2ART, TCO2, HCO3 in the last 72 hours.  Invalid input(s): PCO2 CULTURES Recent Results (from the past 240 hour(s))  Pneumocystis smear by DFA     Status: None   Collection Time: 10/16/17 10:59 AM  Result Value Ref Range Status   Specimen Source-PJSRC SPUTUM  Final   Pneumocystis jiroveci Ag SEE SEPARATE REPORT  Final   Studies/Results: No results found.  Medications:  Prior to Admission:  Medications Prior to Admission  Medication Sig Dispense Refill Last Dose  . abacavir-lamiVUDine (EPZICOM) 600-300 MG per tablet Take 1 tablet by mouth daily.   10/12/2017 at Unknown time  . albuterol (PROVENTIL HFA;VENTOLIN HFA) 108 (90 Base) MCG/ACT inhaler Inhale 1-2 puffs into the lungs 2 (two) times daily as needed for wheezing or shortness  of breath.   10/12/2017 at Unknown time  . atazanavir (REYATAZ) 200 MG capsule Take 400 mg by mouth daily.    10/12/2017 at Unknown time  . atorvastatin (LIPITOR) 10 MG tablet Take 10 mg by mouth 2 (two) times daily.   10/12/2017 at Unknown time  . divalproex (DEPAKOTE) 500 MG DR tablet Take 500 mg by mouth See admin instructions. Take 1 tablet (500 mg) by mouth at 4pm and 8pm daily   10/11/2017 at Unknown time  . haloperidol (HALDOL) 10 MG tablet Take 20 mg by mouth 3 (three) times daily.    10/11/2017 at Unknown time  . hydrOXYzine (VISTARIL) 25 MG capsule Take 25 mg by mouth at bedtime.    10/12/2017 at Unknown time  . Magnesium Oxide 400 MG CAPS Take 1 capsule (400 mg total) by mouth every morning. 60 each 0 10/12/2017 at Unknown time  . polyethylene glycol (MIRALAX / GLYCOLAX) packet Take 17 g by mouth daily as needed.   unknown at unknown  . QUEtiapine (SEROQUEL) 200 MG tablet Take 100 mg by mouth See admin instructions. Take 1 tablet (200 mg) by mouth with supper and at bedtime daily   10/12/2017 at Unknown time  . raltegravir (ISENTRESS) 400 MG tablet Take 400 mg by mouth 2 (two) times daily.   10/12/2017 at Unknown time  . valACYclovir (VALTREX) 1000 MG tablet Take 1,000 mg by mouth daily.    10/12/2017 at Unknown time  . zolpidem (AMBIEN) 10 MG tablet Take  5 mg by mouth at bedtime.    10/11/2017 at Unknown time  . haloperidol decanoate (HALDOL DECANOATE) 100 MG/ML injection Inject 200 mg into the muscle every 28 (twenty-eight) days. Last injection 06/09/15    Not Taking at Unknown time  . trihexyphenidyl (ARTANE) 2 MG tablet Take 2 mg by mouth 2 (two) times daily with a meal.   Taking   Scheduled: . abacavir  600 mg Oral Daily   And  . lamiVUDine  300 mg Oral Daily  . amoxicillin-clavulanate  1 tablet Oral Q12H  . atazanavir  400 mg Oral Q breakfast  . atorvastatin  10 mg Oral BID  . divalproex  500 mg Oral 2 times per day  . enoxaparin (LOVENOX) injection  40 mg Subcutaneous Q24H  .  guaiFENesin  1,200 mg Oral BID  . haloperidol  20 mg Oral TID  . hydrOXYzine  25 mg Oral QHS  . ipratropium-albuterol  3 mL Nebulization TID  . magnesium oxide  400 mg Oral Daily  . mouth rinse  15 mL Mouth Rinse BID  . methylPREDNISolone (SOLU-MEDROL) injection  40 mg Intravenous Q6H  . QUEtiapine  100 mg Oral 2 times per day  . raltegravir  400 mg Oral BID  . sulfamethoxazole-trimethoprim  2 tablet Oral TID  . trihexyphenidyl  2 mg Oral BID WC  . valACYclovir  1,000 mg Oral Daily  . zolpidem  5 mg Oral QHS   Continuous:  KHV:[id number] **OR** acetaminophen, albuterol, ibuprofen, ondansetron **OR** ondansetron (ZOFRAN) IV, polyethylene glycol  Assesment: She was admitted with sepsis.  She has healthcare associated pneumonia.  She has acute hypoxic respiratory failure and has been requiring high flow nasal oxygen.  Her oxygen has been decreased.  Her situation is complicated by HIV/AIDS with low CD4 count.  She also has schizophrenia which complicates her situation.  She is not being very active and I told her she needs up and move and get out of bed and that may help with her oxygenation is Active Problems:   Sepsis ([place])   Tobacco use disorder   HIV (human immunodeficiency virus infection) ([place])   Schizophrenia ([place])   HCAP (healthcare-associated pneumonia)   Acute respiratory failure with hypoxia ([place])   AIDS ([place])   COPD exacerbation ([place])    Plan: Continue treatments    LOS: 11 days   Karina Clarke L 10/23/2017, 8:35 AM

## 2017-10-24 DIAGNOSIS — E875 Hyperkalemia: Secondary | ICD-10-CM

## 2017-10-24 DIAGNOSIS — E119 Type 2 diabetes mellitus without complications: Secondary | ICD-10-CM

## 2017-10-24 DIAGNOSIS — N179 Acute kidney failure, unspecified: Secondary | ICD-10-CM

## 2017-10-24 LAB — BASIC METABOLIC PANEL
ANION GAP: 14 (ref 5–15)
ANION GAP: 12 (ref 5–15)
ANION GAP: 10 (ref 5–15)
Anion gap: 13 (ref 5–15)
BUN: 58 mg/dL — AB (ref 6–20)
BUN: 56 mg/dL — ABNORMAL HIGH (ref 6–20)
BUN: 50 mg/dL — AB (ref 6–20)
BUN: 43 mg/dL — ABNORMAL HIGH (ref 6–20)
CALCIUM: 10 mg/dL (ref 8.9–10.3)
CALCIUM: 10.2 mg/dL (ref 8.9–10.3)
CALCIUM: 9.7 mg/dL (ref 8.9–10.3)
CALCIUM: 9.9 mg/dL (ref 8.9–10.3)
CO2: 20 mmol/L — ABNORMAL LOW (ref 22–32)
CO2: 19 mmol/L — ABNORMAL LOW (ref 22–32)
CO2: 21 mmol/L — ABNORMAL LOW (ref 22–32)
CO2: 21 mmol/L — ABNORMAL LOW (ref 22–32)
CREATININE: 1.85 mg/dL — AB (ref 0.44–1.00)
Chloride: 92 mmol/L — ABNORMAL LOW (ref 101–111)
Chloride: 93 mmol/L — ABNORMAL LOW (ref 101–111)
Chloride: 94 mmol/L — ABNORMAL LOW (ref 101–111)
Chloride: 98 mmol/L — ABNORMAL LOW (ref 101–111)
Creatinine, Ser: 1.86 mg/dL — ABNORMAL HIGH (ref 0.44–1.00)
Creatinine, Ser: 1.74 mg/dL — ABNORMAL HIGH (ref 0.44–1.00)
Creatinine, Ser: 1.49 mg/dL — ABNORMAL HIGH (ref 0.44–1.00)
GFR calc Af Amer: 35 mL/min — ABNORMAL LOW (ref >60)
GFR calc Af Amer: 34 mL/min — ABNORMAL LOW (ref >60)
GFR calc Af Amer: 37 mL/min — ABNORMAL LOW (ref >60)
GFR calc Af Amer: 45 mL/min — ABNORMAL LOW (ref >60)
GFR, EST NON AFRICAN AMERICAN: 30 mL/min — AB (ref >60)
GFR, EST NON AFRICAN AMERICAN: 30 mL/min — AB (ref >60)
GFR, EST NON AFRICAN AMERICAN: 32 mL/min — AB (ref >60)
GFR, EST NON AFRICAN AMERICAN: 39 mL/min — AB (ref >60)
GLUCOSE: 680 mg/dL — AB (ref 65–99)
Glucose, Bld: 599 mg/dL (ref 65–99)
Glucose, Bld: 478 mg/dL — ABNORMAL HIGH (ref 65–99)
Glucose, Bld: 251 mg/dL — ABNORMAL HIGH (ref 65–99)
POTASSIUM: 6.4 mmol/L — AB (ref 3.5–5.1)
POTASSIUM: 5.9 mmol/L — AB (ref 3.5–5.1)
POTASSIUM: 6 mmol/L — AB (ref 3.5–5.1)
Potassium: 7 mmol/L (ref 3.5–5.1)
SODIUM: 125 mmol/L — AB (ref 135–145)
SODIUM: 126 mmol/L — AB (ref 135–145)
SODIUM: 127 mmol/L — AB (ref 135–145)
SODIUM: 129 mmol/L — AB (ref 135–145)

## 2017-10-24 LAB — GLUCOSE, CAPILLARY
GLUCOSE-CAPILLARY: 567 mg/dL — AB (ref 65–99)
GLUCOSE-CAPILLARY: 454 mg/dL — AB (ref 65–99)
GLUCOSE-CAPILLARY: 425 mg/dL — AB (ref 65–99)
GLUCOSE-CAPILLARY: 430 mg/dL — AB (ref 65–99)
GLUCOSE-CAPILLARY: 264 mg/dL — AB (ref 65–99)
Glucose-Capillary: 357 mg/dL — ABNORMAL HIGH (ref 65–99)
Glucose-Capillary: 411 mg/dL — ABNORMAL HIGH (ref 65–99)
Glucose-Capillary: 245 mg/dL — ABNORMAL HIGH (ref 65–99)
Glucose-Capillary: 299 mg/dL — ABNORMAL HIGH (ref 65–99)
Glucose-Capillary: 350 mg/dL — ABNORMAL HIGH (ref 65–99)

## 2017-10-24 LAB — CBC
HEMATOCRIT: 43.1 % (ref 36.0–46.0)
Hemoglobin: 14 g/dL (ref 12.0–15.0)
MCH: 30.2 pg (ref 26.0–34.0)
MCHC: 32.5 g/dL (ref 30.0–36.0)
MCV: 93.1 fL (ref 78.0–100.0)
PLATELETS: 232 10*3/uL (ref 150–400)
RBC: 4.63 MIL/uL (ref 3.87–5.11)
RDW: 13.8 % (ref 11.5–15.5)
WBC: 5.9 10*3/uL (ref 4.0–10.5)

## 2017-10-24 MED ORDER — DEXTROSE-NACL 5-0.45 % IV SOLN: INTRAVENOUS | Status: DC

## 2017-10-24 MED ORDER — INSULIN ASPART 100 UNIT/ML ~~LOC~~ SOLN
0.0000 [IU] | Freq: Three times a day (TID) | SUBCUTANEOUS | Status: DC
Start: 2017-10-24 — End: 2017-10-27
  Administered 2017-10-24: 7 [IU] via SUBCUTANEOUS
  Administered 2017-10-25: 15 [IU] via SUBCUTANEOUS
  Administered 2017-10-25: 20 [IU] via SUBCUTANEOUS
  Administered 2017-10-25: 4 [IU] via SUBCUTANEOUS
  Administered 2017-10-26 (×2): 20 [IU] via SUBCUTANEOUS
  Administered 2017-10-27: 7 [IU] via SUBCUTANEOUS
  Administered 2017-10-27: 11 [IU] via SUBCUTANEOUS

## 2017-10-24 MED ORDER — SODIUM CHLORIDE 0.9 % IV BOLUS (SEPSIS)
1000.0000 mL | INTRAVENOUS | Status: AC
  Administered 2017-10-24: 1000 mL via INTRAVENOUS

## 2017-10-24 MED ORDER — INSULIN ASPART 100 UNIT/ML IV SOLN
10.0000 [IU] | INTRAVENOUS | Status: AC
  Administered 2017-10-24: 10 [IU] via INTRAVENOUS

## 2017-10-24 MED ORDER — INSULIN DETEMIR 100 UNIT/ML ~~LOC~~ SOLN
10.0000 [IU] | Freq: Every day | SUBCUTANEOUS | Status: DC
  Administered 2017-10-24: 10 [IU] via SUBCUTANEOUS
  Filled 2017-10-24 (×3): qty 0.1

## 2017-10-24 MED ORDER — CALCIUM GLUCONATE 10 % IV SOLN
1.0000 g | Freq: Once | INTRAVENOUS | Status: DC
  Filled 2017-10-24: qty 10

## 2017-10-24 MED ORDER — INSULIN ASPART 100 UNIT/ML ~~LOC~~ SOLN
0.0000 [IU] | Freq: Every day | SUBCUTANEOUS | Status: DC
  Administered 2017-10-24 – 2017-10-25 (×2): 3 [IU] via SUBCUTANEOUS

## 2017-10-24 MED ORDER — INSULIN DETEMIR 100 UNIT/ML ~~LOC~~ SOLN
SUBCUTANEOUS | Status: AC
  Filled 2017-10-24: qty 1

## 2017-10-24 MED ORDER — SODIUM CHLORIDE 0.9 % IV SOLN
INTRAVENOUS | Status: DC
  Administered 2017-10-24: 10:00:00 via INTRAVENOUS

## 2017-10-24 MED ORDER — INSULIN ASPART 100 UNIT/ML ~~LOC~~ SOLN
6.0000 [IU] | Freq: Three times a day (TID) | SUBCUTANEOUS | Status: DC
  Administered 2017-10-25 – 2017-10-27 (×7): 6 [IU] via SUBCUTANEOUS

## 2017-10-24 MED ORDER — SODIUM CHLORIDE 0.9 % IV SOLN
INTRAVENOUS | Status: DC
  Administered 2017-10-24: 5.4 [IU]/h via INTRAVENOUS
  Filled 2017-10-24: qty 1

## 2017-10-24 NOTE — Consult Note (Signed)
Karina Clarke MRN: [id number] DOB/AGE: [birth year] 57 y.o. Primary Care Physician:Fanta, Tesfaye, MD Admit date: 10/12/2017 Chief Complaint:  Chief Complaint  Patient presents with  . Cough   HPI: Pt is 56 year old female with past medical hx of HIV who came to ER with c/o cough. HPI dates back to 10/12/17 when pt came from a group home on 11/18 due to shortness of breath.  Pt at the time of admission was found to have extensive bilateral pneumonia and admitted. Pt was started on broad spectrum antibiotics and now on steriods. Pt had AKI and hyperkalemia so nephrology was consulted. Pt seen today in ICU. Pt offers no new complaints. Pt just says " I am okay"  NO c.o fever NO c/o hematuria NO c/o emesis NO c/o abdominal pain.   Past Medical History:  Diagnosis Date  . 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 Bloating    per sister  . Chronic right hip pain    sister states circulation problem in hip  . Family history of adverse reaction to anesthesia    sister has hx. of post-op N/V  . High cholesterol   . HIV (human immunodeficiency virus infection) ([place])   . Hypertension   . Limp   . Mild intellectual disability   . No natural teeth    does not wear her dentures  . Paranoid schizophrenia ([place])   . Personality disorder ([place])   . Schizophrenia ([place])        Family History  Problem Relation Age of Onset  . Anesthesia problems Sister        post-op N/V    Social History:  reports that she has been smoking cigarettes.  She has a 18.00 pack-year smoking history. she has never used smokeless tobacco. She reports that she does not drink alcohol or use drugs.   Allergies:  Allergies  Allergen Reactions  . Asa [Aspirin] Nausea And Vomiting    Medications Prior to Admission  Medication Sig Dispense Refill  . abacavir-lamiVUDine (EPZICOM) 600-300 MG per tablet Take 1 tablet by mouth daily.    [name] albuterol (PROVENTIL HFA;VENTOLIN HFA) 108 (90 Base)  MCG/ACT inhaler Inhale 1-2 puffs into the lungs 2 (two) times daily as needed for wheezing or shortness of breath.    [name] atazanavir (REYATAZ) 200 MG capsule Take 400 mg by mouth daily.     [name] atorvastatin (LIPITOR) 10 MG tablet Take 10 mg by mouth 2 (two) times daily.    . divalproex (DEPAKOTE) 500 MG DR tablet Take 500 mg by mouth See admin instructions. Take 1 tablet (500 mg) by mouth at 4pm and 8pm daily    . haloperidol (HALDOL) 10 MG tablet Take 20 mg by mouth 3 (three) times daily.     . hydrOXYzine (VISTARIL) 25 MG capsule Take 25 mg by mouth at bedtime.     . Magnesium Oxide 400 MG CAPS Take 1 capsule (400 mg total) by mouth every morning. 60 each 0  . polyethylene glycol (MIRALAX / GLYCOLAX) packet Take 17 g by mouth daily as needed.    [name] QUEtiapine (SEROQUEL) 200 MG tablet Take 100 mg by mouth See admin instructions. Take 1 tablet (200 mg) by mouth with supper and at bedtime daily    . raltegravir (ISENTRESS) 400 MG tablet Take 400 mg by mouth 2 (two) times daily.    . valACYclovir (VALTREX) 1000 MG tablet Take 1,000 mg by mouth daily.     [name] zolpidem (AMBIEN) 10  MG tablet Take 5 mg by mouth at bedtime.     . haloperidol decanoate (HALDOL DECANOATE) 100 MG/ML injection Inject 200 mg into the muscle every 28 (twenty-eight) days. Last injection 06/09/15     . trihexyphenidyl (ARTANE) 2 MG tablet Take 2 mg by mouth 2 (two) times daily with a meal.         DTO:IZTIW from the symptoms mentioned above,there are no other symptoms referable to all systems reviewed.  [name] abacavir  600 mg Oral Daily   And  . lamiVUDine  300 mg Oral Daily  . amoxicillin-clavulanate  1 tablet Oral Q12H  . atazanavir  400 mg Oral Q breakfast  . atorvastatin  10 mg Oral BID  . calcium gluconate  1 g Intravenous Once  . divalproex  500 mg Oral 2 times per day  . enoxaparin (LOVENOX) injection  40 mg Subcutaneous Q24H  . guaiFENesin  1,200 mg Oral BID  . haloperidol  20 mg Oral TID  . hydrOXYzine  25 mg Oral QHS   . ipratropium-albuterol  3 mL Nebulization TID  . magnesium oxide  400 mg Oral Daily  . mouth rinse  15 mL Mouth Rinse BID  . methylPREDNISolone (SOLU-MEDROL) injection  40 mg Intravenous Q6H  . QUEtiapine  100 mg Oral 2 times per day  . raltegravir  400 mg Oral BID  . sulfamethoxazole-trimethoprim  2 tablet Oral TID  . trihexyphenidyl  2 mg Oral BID WC  . valACYclovir  1,000 mg Oral Daily  . zolpidem  5 mg Oral QHS        Physical Exam: Vital signs in last 24 hours: Temp:  [97.8 F (36.6 C)-98.9 F (37.2 C)] 98.5 F (36.9 C) (11/30 0550) Pulse Rate:  [85-103] 88 (11/30 1300) Resp:  [14-20] 20 (11/30 1300) BP: (104-152)/(61-114) 127/75 (11/30 1300) SpO2:  [89 %-99 %] 99 % (11/30 1300) Weight change:  Last BM Date: 10/22/17  Intake/Output from previous day: 11/29 0701 - 11/30 0700 In: 960 [P.O.:960] Out: 3000 [Urine:3000] No intake/output data recorded.   Physical Exam: General- pt is awake,alert, oriented to time place and person Resp- No acute REsp distress, CTA B/L NO Rhonchi CVS- S1S2 regular in rate and rhythm GIT- BS+, soft, NT, ND EXT- NO LE Edema, Cyanosis CNS- CN 2-12 grossly intact. Moving all 4 extremities Psych- flat mood and affect    Lab Results: CBC Recent Labs    10/24/17 0428  WBC 5.9  HGB 14.0  HCT 43.1  PLT 232    BMET Recent Labs    10/24/17 0648 10/24/17 1024  NA 126* 127*  K 6.4* 5.9*  CL 93* 94*  CO2 19* 21*  GLUCOSE 599* 478*  BUN 56* 50*  CREATININE 1.86* 1.74*  CALCIUM 10.2 9.7     Trend Creat 2018   0.9=> 1.8=> 1.7  Sodium 2018  137=> 127  Potassium 2018  7.0=> 5.9  MICRO Recent Results (from the past 240 hour([place]))  Pneumocystis smear by DFA     Status: None   Collection Time: 10/16/17 10:59 AM  Result Value Ref Range Status   Specimen Source-PJSRC SPUTUM  Final   Pneumocystis jiroveci Ag SEE SEPARATE REPORT  Final      Lab Results  Component Value Date   CALCIUM 9.7 10/24/2017   CAION 1.18  08/08/2017      Impression: 1)Renal  AKI secondary to Prerenal/ATN                AKI sec  to bactrim/negative by 10 liters since admission                AKI sec to uncontrolled hyperglycemia causing hypovolemia                AKi now at plateau    2)HTN  BP stable  3)Anemia HGb at goal (9--11) Pt Hgb  11.3=> 14.0  Hypovolemia from polyuria from uncontrolled DM causing hemoconcentration  4)ID admitted with HCAP          Hx of HIV          On broad spectrum abx   5)Hyponatremia       Sec to Hyperglycemia       Quetiapine and Valproic acif may have some contribution  6)hyperkalemia      AKI + Uncontrolled DM + Bactrim    7)Acid base Co2 at goal   8) Endo- DM-primary team following    Plan:  Will ask for renal u.Clarke Will ask for FENA Will suggest to hold bactrim for few days Will suggest to check valproic acid level Will suggest to increase IVF to  115ml/hr Will suggest to d/c nsaids      Elishia Kaczorowski Clarke 10/24/2017, 1:53 PM

## 2017-10-24 NOTE — Progress Notes (Signed)
Initial Nutrition Assessment  DOCUMENTATION CODES:      INTERVENTION:  Follow for diet advancement  Provide education as indicated   NUTRITION DIAGNOSIS:   Inadequate oral intake related to acute illness(HCAP /COPD exacerbation) as evidenced by energy intake < or equal to 50% for > or equal to 5 days.   GOAL:   Patient will meet greater than or equal to 90% of their needs  MONITOR:   Diet advancement, PO intake, Labs, Weight trends  REASON FOR ASSESSMENT:   LOS    ASSESSMENT:  56 yo female from group home. Hx of HIV, Schizophrenia, HTN, and chronic right hip pain. RD drawn to pt due to length of stay. Chart reviewed. She presents with HCAP/ COPD exacerbation. She has been moved down to ICU due to hyperglycemia per discussion with nursing. She is on 10 liters high flow O2.   Nutrition intake: meals averaging 50% most days until yesterday improved to 75-100%. Feeds herself but has poor dentition. Patient has limited response to RD concerning usual diet at group home or use of mechanically altered foods.  Weight hx shows: pt near usual body wt range of 109-112 kg the past year.   Recent Labs  Lab 10/24/17 0428 10/24/17 0648 10/24/17 1024  NA 125* 126* 127*  K 7.0* 6.4* 5.9*  CL 92* 93* 94*  CO2 20* 19* 21*  BUN 58* 56* 50*  CREATININE 1.85* 1.86* 1.74*  CALCIUM 10.0 10.2 9.7  GLUCOSE 680* 599* 478*    Labs:(metabolic dysregulation)- sodium 126, potassium 6.4, BUN 56 and Cr 1.86, Glu 599.   NUTRITION - FOCUSED PHYSICAL EXAM:    Most Recent Value  Orbital Region  No depletion  Upper Arm Region  No depletion  Thoracic and Lumbar Region  No depletion  Buccal Region  Mild depletion  Temple Region  Mild depletion  Clavicle and Acromion Bone Region  No depletion  Scapular Bone Region  No depletion  Dorsal Hand  No depletion  Patellar Region  No depletion  Anterior Thigh Region  Mild depletion [uses walker]  Posterior Calf Region  Mild depletion [uses walker]   Edema (RD Assessment)  None  Hair  Reviewed  Eyes  Reviewed  Mouth  Reviewed [missing teeth]  Skin  Reviewed  Nails  Reviewed     Labs and meds reviewed.   Diet Order:  Diet NPO time specified  EDUCATION NEEDS:   No education needs have been identified at this time Skin:  Skin Assessment: Reviewed RN Assessment  Last BM:  11/28  Height:   Ht Readings from Last 1 Encounters:  10/12/17 5\' 11"  (1.803 m)    Weight:   Wt Readings from Last 1 Encounters:  10/12/17 238 lb 12.1 oz (108.3 kg)    Ideal Body Weight:  70 kg  BMI:  Body mass index is 33.3 kg/m.  Estimated Nutritional Needs:   Kcal:  1900-2000  Protein:  119-135 gr  Fluid:  per MD goals  Colman Cater MS,RD,CSG,LDN Office: [phone] Pager: [phone]

## 2017-10-24 NOTE — Progress Notes (Addendum)
CRITICAL VALUE ALERT  Critical Value:  Potassium 7, and Glucose 680  Date & Time Notied:  10/24/2017, 6503  Provider Notified: T. Opyd  Orders Received/Actions taken: New orders placed, transfer pt to stepdown.

## 2017-10-24 NOTE — Progress Notes (Signed)
Found pt with HFNC off. RA O2 saturation was 75%. Replaced HFNC and O2 increased to 92% very quickly. Pt is on 10L HFNC. Pt encouraged to wear O2 but Pt continues to pull at cannula. Pt also refused nebulizer treatment. Pt encouraged to become more compliant with care but did not care to have that conversation with me.

## 2017-10-24 NOTE — Progress Notes (Signed)
Pt continually pulling off leads and BP cuff; refusing to allow staff to put back on. Patient also pulled out IV; unable to establish access at this time. MD notified.

## 2017-10-24 NOTE — Progress Notes (Signed)
Subjective: She has been transferred to stepdown.  She has elevated potassium and elevated blood sugar and her renal function is not as good as noted previously.  She is not complaining of shortness of breath now.  She remains on 10 L high flow oxygen with O2 saturation in the 93% range.  Objective: Vital signs in last 24 hours: Temp:  [97.8 F (36.6 C)-98.9 F (37.2 C)] 98.5 F (36.9 C) (11/30 0550) Pulse Rate:  [90-103] 99 (11/30 0550) Resp:  [14-20] 20 (11/30 0550) BP: (104-132)/(61-90) 104/61 (11/30 0550) SpO2:  [88 %-99 %] 89 % (11/30 0550) Weight change:  Last BM Date: 10/22/17  Intake/Output from previous day: 11/29 0701 - 11/30 0700 In: 960 [P.O.:960] Out: 3000 [Urine:3000]  PHYSICAL EXAM General appearance: alert, cooperative and no distress Resp: rhonchi bilaterally Cardio: regular rate and rhythm, S1, S2 normal, no murmur, click, rub or gallop GI: soft, non-tender; bowel sounds normal; no masses,  no organomegaly Extremities: extremities normal, atraumatic, no cyanosis or edema Flat affect.  Skin warm and dry  Lab Results:  Results for orders placed or performed during the hospital encounter of 10/12/17 (from the past 48 hour(s))  Basic metabolic panel     Status: Abnormal   Collection Time: 10/24/17  4:28 AM  Result Value Ref Range   Sodium 125 (L) 135 - 145 mmol/L   Potassium 7.0 (HH) 3.5 - 5.1 mmol/L    Comment: CRITICAL RESULT CALLED TO, READ BACK BY AND VERIFIED WITH: MARTIN J. AT 0624A ON [id number] BY THOMPSON S.    Chloride 92 (L) 101 - 111 mmol/L   CO2 20 (L) 22 - 32 mmol/L   Glucose, Bld 680 (HH) 65 - 99 mg/dL    Comment: CRITICAL RESULT CALLED TO, READ BACK BY AND VERIFIED WITH: MARTIN J. AT 0624A ON [id number] BY THOMPSON S.    BUN 58 (H) 6 - 20 mg/dL   Creatinine, Ser 1.85 (H) 0.44 - 1.00 mg/dL   Calcium 10.0 8.9 - 10.3 mg/dL   GFR calc non Af Amer 30 (L) >60 mL/min   GFR calc Af Amer 35 (L) >60 mL/min    Comment: (NOTE) The eGFR has been calculated  using the CKD EPI equation. This calculation has not been validated in all clinical situations. eGFR's persistently <60 mL/min signify possible Chronic Kidney Disease.    Anion gap 13 5 - 15  CBC     Status: None   Collection Time: 10/24/17  4:28 AM  Result Value Ref Range   WBC 5.9 4.0 - 10.5 K/uL   RBC 4.63 3.87 - 5.11 MIL/uL   Hemoglobin 14.0 12.0 - 15.0 g/dL   HCT 43.1 36.0 - 46.0 %   MCV 93.1 78.0 - 100.0 fL   MCH 30.2 26.0 - 34.0 pg   MCHC 32.5 30.0 - 36.0 g/dL   RDW 13.8 11.5 - 15.5 %   Platelets 232 150 - 400 K/uL  Basic metabolic panel     Status: Abnormal   Collection Time: 10/24/17  6:48 AM  Result Value Ref Range   Sodium 126 (L) 135 - 145 mmol/L   Potassium 6.4 (HH) 3.5 - 5.1 mmol/L    Comment: CRITICAL RESULT CALLED TO, READ BACK BY AND VERIFIED WITH: WORKMAN K. AT 8841Y ON [id number] BY THOMPSON S.    Chloride 93 (L) 101 - 111 mmol/L   CO2 19 (L) 22 - 32 mmol/L   Glucose, Bld 599 (HH) 65 - 99 mg/dL    Comment: CRITICAL  RESULT CALLED TO, READ BACK BY AND VERIFIED WITH: WORKMAN K. AT 0735A ON [id number] BY THOMPSON S.    BUN 56 (H) 6 - 20 mg/dL   Creatinine, Ser 1.86 (H) 0.44 - 1.00 mg/dL   Calcium 10.2 8.9 - 10.3 mg/dL   GFR calc non Af Amer 30 (L) >60 mL/min   GFR calc Af Amer 34 (L) >60 mL/min    Comment: (NOTE) The eGFR has been calculated using the CKD EPI equation. This calculation has not been validated in all clinical situations. eGFR's persistently <60 mL/min signify possible Chronic Kidney Disease.    Anion gap 14 5 - 15    ABGS No results for input(s): PHART, PO2ART, TCO2, HCO3 in the last 72 hours.  Invalid input(s): PCO2 CULTURES Recent Results (from the past 240 hour(s))  Pneumocystis smear by DFA     Status: None   Collection Time: 10/16/17 10:59 AM  Result Value Ref Range Status   Specimen Source-PJSRC SPUTUM  Final   Pneumocystis jiroveci Ag SEE SEPARATE REPORT  Final   Studies/Results: No results found.  Medications:  Prior to  Admission:  Medications Prior to Admission  Medication Sig Dispense Refill Last Dose  . abacavir-lamiVUDine (EPZICOM) 600-300 MG per tablet Take 1 tablet by mouth daily.   10/12/2017 at Unknown time  . albuterol (PROVENTIL HFA;VENTOLIN HFA) 108 (90 Base) MCG/ACT inhaler Inhale 1-2 puffs into the lungs 2 (two) times daily as needed for wheezing or shortness of breath.   10/12/2017 at Unknown time  . atazanavir (REYATAZ) 200 MG capsule Take 400 mg by mouth daily.    10/12/2017 at Unknown time  . atorvastatin (LIPITOR) 10 MG tablet Take 10 mg by mouth 2 (two) times daily.   10/12/2017 at Unknown time  . divalproex (DEPAKOTE) 500 MG DR tablet Take 500 mg by mouth See admin instructions. Take 1 tablet (500 mg) by mouth at 4pm and 8pm daily   10/11/2017 at Unknown time  . haloperidol (HALDOL) 10 MG tablet Take 20 mg by mouth 3 (three) times daily.    10/11/2017 at Unknown time  . hydrOXYzine (VISTARIL) 25 MG capsule Take 25 mg by mouth at bedtime.    10/12/2017 at Unknown time  . Magnesium Oxide 400 MG CAPS Take 1 capsule (400 mg total) by mouth every morning. 60 each 0 10/12/2017 at Unknown time  . polyethylene glycol (MIRALAX / GLYCOLAX) packet Take 17 g by mouth daily as needed.   unknown at unknown  . QUEtiapine (SEROQUEL) 200 MG tablet Take 100 mg by mouth See admin instructions. Take 1 tablet (200 mg) by mouth with supper and at bedtime daily   10/12/2017 at Unknown time  . raltegravir (ISENTRESS) 400 MG tablet Take 400 mg by mouth 2 (two) times daily.   10/12/2017 at Unknown time  . valACYclovir (VALTREX) 1000 MG tablet Take 1,000 mg by mouth daily.    10/12/2017 at Unknown time  . zolpidem (AMBIEN) 10 MG tablet Take 5 mg by mouth at bedtime.    10/11/2017 at Unknown time  . haloperidol decanoate (HALDOL DECANOATE) 100 MG/ML injection Inject 200 mg into the muscle every 28 (twenty-eight) days. Last injection 06/09/15    Not Taking at Unknown time  . trihexyphenidyl (ARTANE) 2 MG tablet Take 2 mg by  mouth 2 (two) times daily with a meal.   Taking   Scheduled: . abacavir  600 mg Oral Daily   And  . lamiVUDine  300 mg Oral Daily  . amoxicillin-clavulanate  1 tablet Oral Q12H  . atazanavir  400 mg Oral Q breakfast  . atorvastatin  10 mg Oral BID  . calcium gluconate  1 g Intravenous Once  . divalproex  500 mg Oral 2 times per day  . enoxaparin (LOVENOX) injection  40 mg Subcutaneous Q24H  . guaiFENesin  1,200 mg Oral BID  . haloperidol  20 mg Oral TID  . hydrOXYzine  25 mg Oral QHS  . ipratropium-albuterol  3 mL Nebulization TID  . magnesium oxide  400 mg Oral Daily  . mouth rinse  15 mL Mouth Rinse BID  . methylPREDNISolone (SOLU-MEDROL) injection  40 mg Intravenous Q6H  . QUEtiapine  100 mg Oral 2 times per day  . raltegravir  400 mg Oral BID  . sulfamethoxazole-trimethoprim  2 tablet Oral TID  . trihexyphenidyl  2 mg Oral BID WC  . valACYclovir  1,000 mg Oral Daily  . zolpidem  5 mg Oral QHS   Continuous: . sodium chloride    . dextrose 5 % and 0.45% NaCl    . insulin (NOVOLIN-R) infusion     BFX:OVANVBTYOMAYO **OR** acetaminophen, albuterol, ibuprofen, ondansetron **OR** ondansetron (ZOFRAN) IV, polyethylene glycol  Assesment: She was admitted with healthcare associated pneumonia COPD exacerbation hypoxic respiratory failure and sepsis.  Her situation is complicated by HIV/AIDS with low CD4 count and by schizophrenia.  She has been transferred to stepdown to manage blood sugar and elevated potassium.  Her breathing seems to be doing fairly well but she is still requiring high flow oxygen. Active Problems:   Sepsis ([place])   Tobacco use disorder   HIV (human immunodeficiency virus infection) ([place])   Schizophrenia ([place])   HCAP (healthcare-associated pneumonia)   Acute respiratory failure with hypoxia ([place])   AIDS ([place])   COPD exacerbation ([place])    Plan: Continue treatments.  Continue efforts at having her do incentive spirometry trying to get up and move and see if  that will help [place] to reduce her oxygen requirement.  She is on antibiotics for pneumonia and I think she is slowly improving    LOS: 12 days   Chucky Homes L 10/24/2017, 7:59 AM

## 2017-10-24 NOTE — Progress Notes (Signed)
PROGRESS NOTE    Karina Clarke  WJX:[id number] DOB: [birth year] DOA: 10/12/2017 PCP: Rosita Fire, MD     Brief Narrative:  56 year old woman admitted from a group home on 11/18 due to shortness of breath.  Found to have extensive bilateral pneumonia, still with high oxygen requirements.  Past medical history significant for HIV/AIDS and schizophrenia.  Overnight on 11/29 was noted to be hyperkalemic with acute renal failure and CBG above 600.  She is not known to be a diabetic.   Assessment & Plan:   Active Problems:   Sepsis ([place])   Tobacco use disorder   HIV (human immunodeficiency virus infection) ([place])   Schizophrenia ([place])   HCAP (healthcare-associated pneumonia)   Acute respiratory failure with hypoxia ([place])   AIDS ([place])   COPD exacerbation ([place])   Diabetes mellitus, new onset ([place])   Hyperkalemia   ARF (acute renal failure) ([place])   Acute hypoxemic respiratory failure -Mainly due to community-acquired pneumonia, possibly a very small component of mild pulmonary edema from aggressive volume resuscitation upon initial arrival. -Echo with ejection fraction of 65-70% with grade 1 diastolic dysfunction and no wall motion abnormalities.  She is currently not receiving scheduled Lasix. -She is still requiring 9-10 L of oxygen via high flow nasal cannula. -CT angiogram without PE although she does have dependent consolidation with bilateral lower lobes and right upper lobe. -Influenza PCR screen was negative. -Plan to continue Augmentin and IV steroids. -Appreciate Dr. Luan Pulling input and recommendations.  Community-acquired pneumonia -Continue Augmentin, she is also being treated for PCP pneumonia given her HIV with CD4 count of 150, continue Bactrim and steroids for now.  HIV/AIDS -Last known CD4 count was 150 in September 2018, she follows outpatient with the ID department at [facility] is stable, continue psychotropic  medications.  Hyperkalemia/acute renal failure -Appreciate nephrology input and recommendations, they suspect patient is hemoconcentrated, plan to continue current IV fluids, also hyperkalemia may be related to significant hyperglycemia and has continued to improve throughout the day.  New onset diabetes -Was noted on be met today to have a glucose of above 600. -She is not known to be a diabetic, she has been on steroids throughout this admission. -Was initially placed on glucose stabilizer, will be transitioned over to Lantus and sliding scale which can be adjusted according to CBGs.   DVT prophylaxis: Lovenox Code Status: Full code Family Communication: Patient only Disposition Plan: Anticipate back to group home once medically stable  Consultants:   Pulmonary, Dr. Luan Pulling  Procedures:   None  Antimicrobials:  Anti-infectives (From admission, onward)   Start     Dose/Rate Route Frequency Ordered Stop   10/22/17 1000  amoxicillin-clavulanate (AUGMENTIN) 875-125 MG per tablet 1 tablet     1 tablet Oral Every 12 hours 10/21/17 1521     10/18/17 1100  piperacillin-tazobactam (ZOSYN) IVPB 3.375 g     3.375 g 12.5 mL/hr over 240 Minutes Intravenous Every 8 hours 10/18/17 1039 10/22/17 0642   10/15/17 1600  sulfamethoxazole-trimethoprim (BACTRIM DS,SEPTRA DS) 800-160 MG per tablet 3 tablet  Status:  Discontinued     3 tablet Oral 3 times daily 10/15/17 1008 10/15/17 1017   10/15/17 1600  sulfamethoxazole-trimethoprim (BACTRIM DS,SEPTRA DS) 800-160 MG per tablet 2 tablet     2 tablet Oral 3 times daily 10/15/17 1017     10/15/17 1030  sulfamethoxazole-trimethoprim (BACTRIM DS,SEPTRA DS) 800-160 MG per tablet 1 tablet     1 tablet Oral  Once 10/15/17 1018  10/15/17 1045   10/14/17 1215  sulfamethoxazole-trimethoprim (BACTRIM DS,SEPTRA DS) 800-160 MG per tablet 1 tablet  Status:  Discontinued     1 tablet Oral Every 12 hours 10/14/17 1214 10/15/17 1008   10/13/17 2200   amoxicillin-clavulanate (AUGMENTIN) 875-125 MG per tablet 1 tablet  Status:  Discontinued     1 tablet Oral Every 12 hours 10/13/17 1750 10/18/17 1032   10/13/17 1000  abacavir-lamiVUDine (EPZICOM) 600-300 MG per tablet 1 tablet  Status:  Discontinued     1 tablet Oral Daily 10/12/17 1634 10/13/17 0824   10/13/17 1000  valACYclovir (VALTREX) tablet 1,000 mg     1,000 mg Oral Daily 10/12/17 1634     10/13/17 1000  abacavir (ZIAGEN) tablet 600 mg     600 mg Oral Daily 10/13/17 0824     10/13/17 1000  lamiVUDine (EPIVIR) tablet 300 mg     300 mg Oral Daily 10/13/17 0824     10/13/17 0800  atazanavir (REYATAZ) capsule 400 mg     400 mg Oral Daily with breakfast 10/12/17 1634     10/13/17 0100  ceFEPIme (MAXIPIME) 1 g in dextrose 5 % 50 mL IVPB  Status:  Discontinued     1 g 100 mL/hr over 30 Minutes Intravenous Every 8 hours 10/12/17 1732 10/13/17 1750   10/12/17 2200  vancomycin (VANCOCIN) IVPB 750 mg/150 ml premix  Status:  Discontinued     750 mg 150 mL/hr over 60 Minutes Intravenous Every 12 hours 10/12/17 1258 10/12/17 1634   10/12/17 2200  raltegravir (ISENTRESS) tablet 400 mg     400 mg Oral 2 times daily 10/12/17 1634     10/12/17 1700  piperacillin-tazobactam (ZOSYN) IVPB 3.375 g  Status:  Discontinued     3.375 g 12.5 mL/hr over 240 Minutes Intravenous Every 8 hours 10/12/17 1258 10/12/17 1634   10/12/17 1700  ceFEPIme (MAXIPIME) 2 g in dextrose 5 % 50 mL IVPB     2 g 100 mL/hr over 30 Minutes Intravenous  Once 10/12/17 1634 10/12/17 1733   10/12/17 1645  vancomycin (VANCOCIN) IVPB 1000 mg/200 mL premix  Status:  Discontinued     1,000 mg 200 mL/hr over 60 Minutes Intravenous  Once 10/12/17 1634 10/12/17 1657   10/12/17 1230  vancomycin (VANCOCIN) IVPB 1000 mg/200 mL premix     1,000 mg 200 mL/hr over 60 Minutes Intravenous  Once 10/12/17 1229 10/12/17 1404   10/12/17 1030  piperacillin-tazobactam (ZOSYN) IVPB 3.375 g     3.375 g 100 mL/hr over 30 Minutes Intravenous  Once  10/12/17 1025 10/12/17 1113   10/12/17 1015  sulfamethoxazole-trimethoprim (BACTRIM DS,SEPTRA DS) 800-160 MG per tablet 1 tablet     1 tablet Oral  Once 10/12/17 1003 10/12/17 1020   10/12/17 1000  cefTRIAXone (ROCEPHIN) 1 g in dextrose 5 % 50 mL IVPB  Status:  Discontinued     1 g 100 mL/hr over 30 Minutes Intravenous  Once 10/12/17 0951 10/12/17 0952   10/12/17 1000  azithromycin (ZITHROMAX) 500 mg in dextrose 5 % 250 mL IVPB  Status:  Discontinued     500 mg 250 mL/hr over 60 Minutes Intravenous  Once 10/12/17 0951 10/12/17 0952   10/12/17 1000  vancomycin (VANCOCIN) IVPB 1000 mg/200 mL premix     1,000 mg 200 mL/hr over 60 Minutes Intravenous  Once 10/12/17 0954 10/12/17 1139   10/12/17 1000  piperacillin-tazobactam (ZOSYN) IVPB 2.25 g  Status:  Discontinued     2.25 g 100 mL/hr  over 30 Minutes Intravenous  Once 10/12/17 0954 10/12/17 1025       Subjective: More combative and aggressive today, upset that she has not been given anything to eat  Objective: Vitals:   10/24/17 1400 10/24/17 1409 10/24/17 1500 10/24/17 1600  BP: 132/76  140/88 (!) 143/88  Pulse: 90  (!) 109 100  Resp:   19 (!) 23  Temp:      TempSrc:      SpO2: 96% 93% (!) 89% 96%  Weight:      Height:        Intake/Output Summary (Last 24 hours) at 10/24/2017 1724 Last data filed at 10/24/2017 1400 Gross per 24 hour  Intake 1245.36 ml  Output 2200 ml  Net -954.64 ml   Filed Weights   10/12/17 1200 10/12/17 1506  Weight: 99.8 kg (220 lb) 108.3 kg (238 lb 12.1 oz)    Examination:  General exam: Alert, awake, oriented x 2  Respiratory system: Bilateral crackles Cardiovascular system:RRR. No murmurs, rubs, gallops. Gastrointestinal system: Abdomen is nondistended, soft and nontender. No organomegaly or masses felt. Normal bowel sounds heard. Central nervous system: Alert and oriented. No focal neurological deficits. Extremities: No C/C/E, +pedal pulses Skin: No rashes, lesions or ulcers    Data  Reviewed: I have personally reviewed following labs and imaging studies  CBC: Recent Labs  Lab 10/18/17 0501 10/24/17 0428  WBC 7.1 5.9  HGB 12.1 14.0  HCT 39.2 43.1  MCV 98.5 93.1  PLT 165 081   Basic Metabolic Panel: Recent Labs  Lab 10/18/17 0501 10/19/17 0717 10/24/17 0428 10/24/17 0648 10/24/17 1024  NA 137  --  125* 126* 127*  K 4.6  --  7.0* 6.4* 5.9*  CL 97*  --  92* 93* 94*  CO2 30  --  20* 19* 21*  GLUCOSE 253*  --  680* 599* 478*  BUN 27*  --  58* 56* 50*  CREATININE 1.17* 1.04* 1.85* 1.86* 1.74*  CALCIUM 9.1  --  10.0 10.2 9.7   GFR: Estimated Creatinine Clearance: 50.1 mL/min (A) (by C-G formula based on SCr of 1.74 mg/dL (H)). Liver Function Tests: No results for input(s): AST, ALT, ALKPHOS, BILITOT, PROT, ALBUMIN in the last 168 hours. No results for input(s): LIPASE, AMYLASE in the last 168 hours. No results for input(s): AMMONIA in the last 168 hours. Coagulation Profile: No results for input(s): INR, PROTIME in the last 168 hours. Cardiac Enzymes: No results for input(s): CKTOTAL, CKMB, CKMBINDEX, TROPONINI in the last 168 hours. BNP (last 3 results) No results for input(s): PROBNP in the last 8760 hours. HbA1C: No results for input(s): HGBA1C in the last 72 hours. CBG: Recent Labs  Lab 10/24/17 1229 10/24/17 1333 10/24/17 1431 10/24/17 1601 10/24/17 1706  GLUCAP 430* 411* 357* 264* 245*   Lipid Profile: No results for input(s): CHOL, HDL, LDLCALC, TRIG, CHOLHDL, LDLDIRECT in the last 72 hours. Thyroid Function Tests: No results for input(s): TSH, T4TOTAL, FREET4, T3FREE, THYROIDAB in the last 72 hours. Anemia Panel: No results for input(s): VITAMINB12, FOLATE, FERRITIN, TIBC, IRON, RETICCTPCT in the last 72 hours. Urine analysis:    Component Value Date/Time   COLORURINE YELLOW 10/12/2017 [place] 10/12/2017 0944   LABSPEC 1.017 10/12/2017 0944   PHURINE 5.0 10/12/2017 [place] 10/12/2017 0944    HGBUR NEGATIVE 10/12/2017 [place] 10/12/2017 0944   KETONESUR NEGATIVE 10/12/2017 0944   PROTEINUR NEGATIVE 10/12/2017 0944   NITRITE NEGATIVE  10/12/2017 0944   LEUKOCYTESUR NEGATIVE 10/12/2017 0944   Sepsis Labs: '@LABRCNTIP'$ (procalcitonin:4,lacticidven:4)  ) Recent Results (from the past 240 hour(s))  Pneumocystis smear by DFA     Status: None   Collection Time: 10/16/17 10:59 AM  Result Value Ref Range Status   Specimen Source-PJSRC SPUTUM  Final   Pneumocystis jiroveci Ag SEE SEPARATE REPORT  Final         Radiology Studies: No results found.      Scheduled Meds: . abacavir  600 mg Oral Daily   And  . lamiVUDine  300 mg Oral Daily  . amoxicillin-clavulanate  1 tablet Oral Q12H  . atazanavir  400 mg Oral Q breakfast  . atorvastatin  10 mg Oral BID  . calcium gluconate  1 g Intravenous Once  . divalproex  500 mg Oral 2 times per day  . enoxaparin (LOVENOX) injection  40 mg Subcutaneous Q24H  . guaiFENesin  1,200 mg Oral BID  . haloperidol  20 mg Oral TID  . hydrOXYzine  25 mg Oral QHS  . insulin aspart  0-20 Units Subcutaneous TID WC  . insulin aspart  0-5 Units Subcutaneous QHS  . insulin aspart  6 Units Subcutaneous TID WC  . insulin detemir  10 Units Subcutaneous QHS  . ipratropium-albuterol  3 mL Nebulization TID  . magnesium oxide  400 mg Oral Daily  . mouth rinse  15 mL Mouth Rinse BID  . methylPREDNISolone (SOLU-MEDROL) injection  40 mg Intravenous Q6H  . QUEtiapine  100 mg Oral 2 times per day  . raltegravir  400 mg Oral BID  . sulfamethoxazole-trimethoprim  2 tablet Oral TID  . trihexyphenidyl  2 mg Oral BID WC  . valACYclovir  1,000 mg Oral Daily  . zolpidem  5 mg Oral QHS   Continuous Infusions: . sodium chloride 125 mL/hr at 10/24/17 1006  . dextrose 5 % and 0.45% NaCl       LOS: 12 days    Time spent: 25 minutes. Greater than 50% of this time was spent in direct contact with the patient coordinating  care.     Lelon Frohlich, MD [facility] Pager [phone]  If 7PM-7AM, please contact night-coverage [email or web address] Password [facility] 10/24/2017, 5:24 PM

## 2017-10-25 ENCOUNTER — Inpatient Hospital Stay (HOSPITAL_COMMUNITY): Payer: Medicaid Other

## 2017-10-25 LAB — BASIC METABOLIC PANEL WITH GFR
Anion gap: 12 (ref 5–15)
BUN: 39 mg/dL — ABNORMAL HIGH (ref 6–20)
CO2: 19 mmol/L — ABNORMAL LOW (ref 22–32)
Calcium: 10.1 mg/dL (ref 8.9–10.3)
Chloride: 95 mmol/L — ABNORMAL LOW (ref 101–111)
Creatinine, Ser: 1.36 mg/dL — ABNORMAL HIGH (ref 0.44–1.00)
GFR calc Af Amer: 50 mL/min — ABNORMAL LOW
GFR calc non Af Amer: 43 mL/min — ABNORMAL LOW
Glucose, Bld: 376 mg/dL — ABNORMAL HIGH (ref 65–99)
Potassium: 5.8 mmol/L — ABNORMAL HIGH (ref 3.5–5.1)
Sodium: 126 mmol/L — ABNORMAL LOW (ref 135–145)

## 2017-10-25 LAB — GLUCOSE, CAPILLARY
GLUCOSE-CAPILLARY: 432 mg/dL — AB (ref 65–99)
GLUCOSE-CAPILLARY: 370 mg/dL — AB (ref 65–99)
Glucose-Capillary: 346 mg/dL — ABNORMAL HIGH (ref 65–99)
Glucose-Capillary: 350 mg/dL — ABNORMAL HIGH (ref 65–99)
Glucose-Capillary: 334 mg/dL — ABNORMAL HIGH (ref 65–99)
Glucose-Capillary: 195 mg/dL — ABNORMAL HIGH (ref 65–99)
Glucose-Capillary: 286 mg/dL — ABNORMAL HIGH (ref 65–99)

## 2017-10-25 LAB — CBC
HCT: 43.1 % (ref 36.0–46.0)
Hemoglobin: 14.3 g/dL (ref 12.0–15.0)
MCH: 30.8 pg (ref 26.0–34.0)
MCHC: 33.2 g/dL (ref 30.0–36.0)
MCV: 92.9 fL (ref 78.0–100.0)
Platelets: 190 10*3/uL (ref 150–400)
RBC: 4.64 MIL/uL (ref 3.87–5.11)
RDW: 13.9 % (ref 11.5–15.5)
WBC: 7.1 10*3/uL (ref 4.0–10.5)

## 2017-10-25 MED ORDER — PREDNISONE 20 MG PO TABS
40.0000 mg | ORAL_TABLET | Freq: Once | ORAL | Status: AC
  Administered 2017-10-25: 40 mg via ORAL
  Filled 2017-10-25: qty 2

## 2017-10-25 MED ORDER — INSULIN DETEMIR 100 UNIT/ML ~~LOC~~ SOLN: 20.0000 [IU] | Freq: Every day | SUBCUTANEOUS | Status: DC

## 2017-10-25 MED ORDER — INSULIN DETEMIR 100 UNIT/ML ~~LOC~~ SOLN
25.0000 [IU] | Freq: Every day | SUBCUTANEOUS | Status: DC
  Administered 2017-10-26: 25 [IU] via SUBCUTANEOUS
  Filled 2017-10-25 (×2): qty 0.25

## 2017-10-25 MED ORDER — PREDNISONE 10 MG PO TABS
5.0000 mg | ORAL_TABLET | Freq: Every day | ORAL | Status: DC
  Administered 2017-10-26 – 2017-10-27 (×2): 5 mg via ORAL
  Filled 2017-10-25 (×2): qty 1

## 2017-10-25 MED ORDER — INSULIN DETEMIR 100 UNIT/ML ~~LOC~~ SOLN
20.0000 [IU] | Freq: Every day | SUBCUTANEOUS | Status: DC
  Administered 2017-10-25: 20 [IU] via SUBCUTANEOUS
  Filled 2017-10-25 (×3): qty 0.2

## 2017-10-25 MED ORDER — INSULIN ASPART 100 UNIT/ML ~~LOC~~ SOLN
10.0000 [IU] | Freq: Once | SUBCUTANEOUS | Status: AC
Start: 2017-10-25 — End: 2017-10-25
  Administered 2017-10-25: 10 [IU] via SUBCUTANEOUS

## 2017-10-25 NOTE — Progress Notes (Signed)
Subjective: She says she feels okay.  No new complaints.  She continues to pull her oxygen off but she now has it on and is still on 10 [place].  She is refusing some of her treatments.  Objective: Vital signs in last 24 hours: Temp:  [98.2 F (36.8 C)-98.6 F (37 C)] 98.4 F (36.9 C) (12/01 0500) Pulse Rate:  [82-109] 82 (12/01 0800) Resp:  [16-23] 19 (12/01 0800) BP: (123-152)/(65-112) 123/65 (12/01 0500) SpO2:  [89 %-99 %] 92 % (12/01 0800) Weight change:  Last BM Date: 10/22/17  Intake/Output from previous day: 11/30 0701 - 12/01 0700 In: 1005.4 [P.O.:480; I.V.:525.4] Out: -   PHYSICAL EXAM General appearance: alert and morbidly obese Resp: clear to auscultation bilaterally Cardio: regular rate and rhythm, S1, S2 normal, no murmur, click, rub or gallop GI: soft, non-tender; bowel sounds normal; no masses,  no organomegaly Extremities: extremities normal, atraumatic, no cyanosis or edema Skin warm and dry  Lab Results:  Results for orders placed or performed during the hospital encounter of 10/12/17 (from the past 48 hour(s))  Basic metabolic panel     Status: Abnormal   Collection Time: 10/24/17  4:28 AM  Result Value Ref Range   Sodium 125 (Clarke) 135 - 145 mmol/Clarke   Potassium 7.0 (HH) 3.5 - 5.1 mmol/Clarke    Comment: CRITICAL RESULT CALLED TO, READ BACK BY AND VERIFIED WITH: MARTIN J. AT 0624A ON [id number] BY THOMPSON S.    Chloride 92 (Clarke) 101 - 111 mmol/Clarke   CO2 20 (Clarke) 22 - 32 mmol/Clarke   Glucose, Bld 680 (HH) 65 - 99 mg/dL    Comment: CRITICAL RESULT CALLED TO, READ BACK BY AND VERIFIED WITH: MARTIN J. AT 0624A ON [id number] BY THOMPSON S.    BUN 58 (H) 6 - 20 mg/dL   Creatinine, Ser 1.85 (H) 0.44 - 1.00 mg/dL   Calcium 10.0 8.9 - 10.3 mg/dL   GFR calc non Af Amer 30 (Clarke) >60 mL/min   GFR calc Af Amer 35 (Clarke) >60 mL/min    Comment: (NOTE) The eGFR has been calculated using the CKD EPI equation. This calculation has not been validated in all clinical situations. eGFR's persistently  <60 mL/min signify possible Chronic Kidney Disease.    Anion gap 13 5 - 15  CBC     Status: None   Collection Time: 10/24/17  4:28 AM  Result Value Ref Range   WBC 5.9 4.0 - 10.5 K/uL   RBC 4.63 3.87 - 5.11 MIL/uL   Hemoglobin 14.0 12.0 - 15.0 g/dL   HCT 43.1 36.0 - 46.0 %   MCV 93.1 78.0 - 100.0 fL   MCH 30.2 26.0 - 34.0 pg   MCHC 32.5 30.0 - 36.0 g/dL   RDW 13.8 11.5 - 15.5 %   Platelets 232 150 - 400 K/uL  Basic metabolic panel     Status: Abnormal   Collection Time: 10/24/17  6:48 AM  Result Value Ref Range   Sodium 126 (Clarke) 135 - 145 mmol/Clarke   Potassium 6.4 (HH) 3.5 - 5.1 mmol/Clarke    Comment: CRITICAL RESULT CALLED TO, READ BACK BY AND VERIFIED WITH: WORKMAN K. AT 5188C ON [id number] BY THOMPSON S.    Chloride 93 (Clarke) 101 - 111 mmol/Clarke   CO2 19 (Clarke) 22 - 32 mmol/Clarke   Glucose, Bld 599 (HH) 65 - 99 mg/dL    Comment: CRITICAL RESULT CALLED TO, READ BACK BY AND VERIFIED WITH: WORKMAN K. AT 0160F ON [id number] BY  THOMPSON S.    BUN 56 (H) 6 - 20 mg/dL   Creatinine, Ser 1.86 (H) 0.44 - 1.00 mg/dL   Calcium 10.2 8.9 - 10.3 mg/dL   GFR calc non Af Amer 30 (Clarke) >60 mL/min   GFR calc Af Amer 34 (Clarke) >60 mL/min    Comment: (NOTE) The eGFR has been calculated using the CKD EPI equation. This calculation has not been validated in all clinical situations. eGFR's persistently <60 mL/min signify possible Chronic Kidney Disease.    Anion gap 14 5 - 15  Glucose, capillary     Status: Abnormal   Collection Time: 10/24/17  7:52 AM  Result Value Ref Range   Glucose-Capillary 567 (HH) 65 - 99 mg/dL   Comment 1 Notify RN    Comment 2 Document in Chart   Glucose, capillary     Status: Abnormal   Collection Time: 10/24/17 10:04 AM  Result Value Ref Range   Glucose-Capillary 454 (H) 65 - 99 mg/dL   Comment 1 Notify RN    Comment 2 Document in Chart   Basic metabolic panel     Status: Abnormal   Collection Time: 10/24/17 10:24 AM  Result Value Ref Range   Sodium 127 (Clarke) 135 - 145 mmol/Clarke   Potassium  5.9 (H) 3.5 - 5.1 mmol/Clarke   Chloride 94 (Clarke) 101 - 111 mmol/Clarke   CO2 21 (Clarke) 22 - 32 mmol/Clarke   Glucose, Bld 478 (H) 65 - 99 mg/dL   BUN 50 (H) 6 - 20 mg/dL   Creatinine, Ser 1.74 (H) 0.44 - 1.00 mg/dL   Calcium 9.7 8.9 - 10.3 mg/dL   GFR calc non Af Amer 32 (Clarke) >60 mL/min   GFR calc Af Amer 37 (Clarke) >60 mL/min    Comment: (NOTE) The eGFR has been calculated using the CKD EPI equation. This calculation has not been validated in all clinical situations. eGFR's persistently <60 mL/min signify possible Chronic Kidney Disease.    Anion gap 12 5 - 15  Glucose, capillary     Status: Abnormal   Collection Time: 10/24/17 11:21 AM  Result Value Ref Range   Glucose-Capillary 425 (H) 65 - 99 mg/dL   Comment 1 Notify RN    Comment 2 Document in Chart   Glucose, capillary     Status: Abnormal   Collection Time: 10/24/17 12:29 PM  Result Value Ref Range   Glucose-Capillary 430 (H) 65 - 99 mg/dL   Comment 1 Notify RN    Comment 2 Document in Chart   Glucose, capillary     Status: Abnormal   Collection Time: 10/24/17  1:33 PM  Result Value Ref Range   Glucose-Capillary 411 (H) 65 - 99 mg/dL  Glucose, capillary     Status: Abnormal   Collection Time: 10/24/17  2:31 PM  Result Value Ref Range   Glucose-Capillary 357 (H) 65 - 99 mg/dL  Glucose, capillary     Status: Abnormal   Collection Time: 10/24/17  4:01 PM  Result Value Ref Range   Glucose-Capillary 264 (H) 65 - 99 mg/dL  Basic metabolic panel     Status: Abnormal   Collection Time: 10/24/17  4:37 PM  Result Value Ref Range   Sodium 129 (Clarke) 135 - 145 mmol/Clarke   Potassium 6.0 (H) 3.5 - 5.1 mmol/Clarke   Chloride 98 (Clarke) 101 - 111 mmol/Clarke   CO2 21 (Clarke) 22 - 32 mmol/Clarke   Glucose, Bld 251 (H) 65 - 99 mg/dL   BUN  43 (H) 6 - 20 mg/dL   Creatinine, Ser 1.49 (H) 0.44 - 1.00 mg/dL   Calcium 9.9 8.9 - 10.3 mg/dL   GFR calc non Af Amer 39 (Clarke) >60 mL/min   GFR calc Af Amer 45 (Clarke) >60 mL/min    Comment: (NOTE) The eGFR has been calculated using the CKD EPI  equation. This calculation has not been validated in all clinical situations. eGFR's persistently <60 mL/min signify possible Chronic Kidney Disease.    Anion gap 10 5 - 15  Glucose, capillary     Status: Abnormal   Collection Time: 10/24/17  5:06 PM  Result Value Ref Range   Glucose-Capillary 245 (H) 65 - 99 mg/dL   Comment 1 Notify RN    Comment 2 Document in Chart   Glucose, capillary     Status: Abnormal   Collection Time: 10/24/17  8:45 PM  Result Value Ref Range   Glucose-Capillary 299 (H) 65 - 99 mg/dL   Comment 1 Notify RN    Comment 2 Document in Chart   Glucose, capillary     Status: Abnormal   Collection Time: 10/24/17 11:30 PM  Result Value Ref Range   Glucose-Capillary 350 (H) 65 - 99 mg/dL   Comment 1 Notify RN    Comment 2 Document in Chart   Glucose, capillary     Status: Abnormal   Collection Time: 10/25/17  1:18 AM  Result Value Ref Range   Glucose-Capillary 346 (H) 65 - 99 mg/dL  Glucose, capillary     Status: Abnormal   Collection Time: 10/25/17  4:22 AM  Result Value Ref Range   Glucose-Capillary 432 (H) 65 - 99 mg/dL   Comment 1 Notify RN    Comment 2 Document in Chart   Glucose, capillary     Status: Abnormal   Collection Time: 10/25/17  6:34 AM  Result Value Ref Range   Glucose-Capillary 350 (H) 65 - 99 mg/dL   Comment 1 Notify RN    Comment 2 Document in Chart   Glucose, capillary     Status: Abnormal   Collection Time: 10/25/17  7:49 AM  Result Value Ref Range   Glucose-Capillary 370 (H) 65 - 99 mg/dL    ABGS No results for input(s): PHART, PO2ART, TCO2, HCO3 in the last 72 hours.  Invalid input(s): PCO2 CULTURES Recent Results (from the past 240 hour(s))  Pneumocystis smear by DFA     Status: None   Collection Time: 10/16/17 10:59 AM  Result Value Ref Range Status   Specimen Source-PJSRC SPUTUM  Final   Pneumocystis jiroveci Ag SEE SEPARATE REPORT  Final   Studies/Results: No results found.  Medications:  Prior to Admission:   Medications Prior to Admission  Medication Sig Dispense Refill Last Dose  . abacavir-lamiVUDine (EPZICOM) 600-300 MG per tablet Take 1 tablet by mouth daily.   10/12/2017 at Unknown time  . albuterol (PROVENTIL HFA;VENTOLIN HFA) 108 (90 Base) MCG/ACT inhaler Inhale 1-2 puffs into the lungs 2 (two) times daily as needed for wheezing or shortness of breath.   10/12/2017 at Unknown time  . atazanavir (REYATAZ) 200 MG capsule Take 400 mg by mouth daily.    10/12/2017 at Unknown time  . atorvastatin (LIPITOR) 10 MG tablet Take 10 mg by mouth 2 (two) times daily.   10/12/2017 at Unknown time  . divalproex (DEPAKOTE) 500 MG DR tablet Take 500 mg by mouth See admin instructions. Take 1 tablet (500 mg) by mouth at 4pm and 8pm daily  10/11/2017 at Unknown time  . haloperidol (HALDOL) 10 MG tablet Take 20 mg by mouth 3 (three) times daily.    10/11/2017 at Unknown time  . hydrOXYzine (VISTARIL) 25 MG capsule Take 25 mg by mouth at bedtime.    10/12/2017 at Unknown time  . Magnesium Oxide 400 MG CAPS Take 1 capsule (400 mg total) by mouth every morning. 60 each 0 10/12/2017 at Unknown time  . polyethylene glycol (MIRALAX / GLYCOLAX) packet Take 17 g by mouth daily as needed.   unknown at unknown  . QUEtiapine (SEROQUEL) 200 MG tablet Take 100 mg by mouth See admin instructions. Take 1 tablet (200 mg) by mouth with supper and at bedtime daily   10/12/2017 at Unknown time  . raltegravir (ISENTRESS) 400 MG tablet Take 400 mg by mouth 2 (two) times daily.   10/12/2017 at Unknown time  . valACYclovir (VALTREX) 1000 MG tablet Take 1,000 mg by mouth daily.    10/12/2017 at Unknown time  . zolpidem (AMBIEN) 10 MG tablet Take 5 mg by mouth at bedtime.    10/11/2017 at Unknown time  . haloperidol decanoate (HALDOL DECANOATE) 100 MG/ML injection Inject 200 mg into the muscle every 28 (twenty-eight) days. Last injection 06/09/15    Not Taking at Unknown time  . trihexyphenidyl (ARTANE) 2 MG tablet Take 2 mg by mouth 2  (two) times daily with a meal.   Taking   Scheduled: . abacavir  600 mg Oral Daily   And  . lamiVUDine  300 mg Oral Daily  . amoxicillin-clavulanate  1 tablet Oral Q12H  . atazanavir  400 mg Oral Q breakfast  . atorvastatin  10 mg Oral BID  . calcium gluconate  1 g Intravenous Once  . divalproex  500 mg Oral 2 times per day  . enoxaparin (LOVENOX) injection  40 mg Subcutaneous Q24H  . guaiFENesin  1,200 mg Oral BID  . haloperidol  20 mg Oral TID  . hydrOXYzine  25 mg Oral QHS  . insulin aspart  0-20 Units Subcutaneous TID WC  . insulin aspart  0-5 Units Subcutaneous QHS  . insulin aspart  6 Units Subcutaneous TID WC  . insulin detemir  20 Units Subcutaneous Q breakfast  . ipratropium-albuterol  3 mL Nebulization TID  . magnesium oxide  400 mg Oral Daily  . mouth rinse  15 mL Mouth Rinse BID  . methylPREDNISolone (SOLU-MEDROL) injection  40 mg Intravenous Q6H  . QUEtiapine  100 mg Oral 2 times per day  . raltegravir  400 mg Oral BID  . sulfamethoxazole-trimethoprim  2 tablet Oral TID  . trihexyphenidyl  2 mg Oral BID WC  . valACYclovir  1,000 mg Oral Daily  . zolpidem  5 mg Oral QHS   Continuous: . sodium chloride 125 mL/hr at 10/24/17 1006  . dextrose 5 % and 0.45% NaCl     YDX:AJOINOMVEHMCN **OR** acetaminophen, albuterol, ibuprofen, ondansetron **OR** ondansetron (ZOFRAN) IV, polyethylene glycol  Assesment: She was admitted with sepsis and acute hypoxic respiratory failure with healthcare associated pneumonia.  She is very slowly improving but she is being noncompliant with treatment as well.  Additionally she has COPD at baseline.  She has diabetes and her blood sugar was elevated yesterday.  She had acute renal failure yesterday. Active Problems:   Sepsis ([place])   Tobacco use disorder   HIV (human immunodeficiency virus infection) ([place])   Schizophrenia ([place])   HCAP (healthcare-associated pneumonia)   Acute respiratory failure with hypoxia ([place])  AIDS ([place])   COPD  exacerbation ([place])   Diabetes mellitus, new onset ([place])   Hyperkalemia   ARF (acute renal failure) ([place])    Plan: I do not think there is anything to add.  Continue treatments.  I will be out of town next week .    LOS: 13 days   Karina Clarke 10/25/2017, 9:25 AM

## 2017-10-25 NOTE — Progress Notes (Signed)
Karina Clarke  MRN: [id number]  DOB/AGE: [birth year] 54 y.o.  Primary Care Physician:Fanta, Tesfaye, MD  Admit date: 10/12/2017  Chief Complaint:  Chief Complaint  Patient presents with  . Cough    S-Pt presented on  10/12/2017 with  Chief Complaint  Patient presents with  . Cough  .    Pt is says ' I am okay," Pt is not on any IVF as pt has pulled out her IV and is refusing medications.   meds . abacavir  600 mg Oral Daily   And  . lamiVUDine  300 mg Oral Daily  . amoxicillin-clavulanate  1 tablet Oral Q12H  . atazanavir  400 mg Oral Q breakfast  . atorvastatin  10 mg Oral BID  . calcium gluconate  1 g Intravenous Once  . divalproex  500 mg Oral 2 times per day  . enoxaparin (LOVENOX) injection  40 mg Subcutaneous Q24H  . guaiFENesin  1,200 mg Oral BID  . haloperidol  20 mg Oral TID  . hydrOXYzine  25 mg Oral QHS  . insulin aspart  0-20 Units Subcutaneous TID WC  . insulin aspart  0-5 Units Subcutaneous QHS  . insulin aspart  6 Units Subcutaneous TID WC  . [START ON 10/26/2017] insulin detemir  25 Units Subcutaneous Q breakfast  . ipratropium-albuterol  3 mL Nebulization TID  . magnesium oxide  400 mg Oral Daily  . mouth rinse  15 mL Mouth Rinse BID  . [START ON 10/26/2017] predniSONE  5 mg Oral Q breakfast  . QUEtiapine  100 mg Oral 2 times per day  . raltegravir  400 mg Oral BID  . sulfamethoxazole-trimethoprim  2 tablet Oral TID  . trihexyphenidyl  2 mg Oral BID WC  . valACYclovir  1,000 mg Oral Daily  . zolpidem  5 mg Oral QHS      Physical Exam: Vital signs in last 24 hours: Temp:  [98.2 F (36.8 C)-98.4 F (36.9 C)] 98.4 F (36.9 C) (12/01 0500) Pulse Rate:  [82-102] 82 (12/01 0800) Resp:  [19-20] 19 (12/01 0800) BP: (123-152)/(65-104) 123/65 (12/01 0500) SpO2:  [87 %-96 %] 87 % (12/01 1422) Weight change:  Last BM Date: (unknown)  Intake/Output from previous day: 11/30 0701 - 12/01 0700 In: 1005.4 [P.O.:480; I.V.:525.4] Out: -  No  intake/output data recorded.   Physical Exam: General- pt is awake,alert,follws commands  Resp- No acute REsp distress, CTA B/L NO Rhonchi CVS- S1S2 regular in rate and rhythm GIT- BS+, soft, NT, ND EXT- NO LE Edema, Cyanosis   Lab Results: CBC Recent Labs    10/24/17 0428 10/25/17 1130  WBC 5.9 7.1  HGB 14.0 14.3  HCT 43.1 43.1  PLT 232 190    BMET Recent Labs    10/24/17 1637 10/25/17 1130  NA 129* 126*  K 6.0* 5.8*  CL 98* 95*  CO2 21* 19*  GLUCOSE 251* 376*  BUN 43* 39*  CREATININE 1.49* 1.36*  CALCIUM 9.9 10.1    MICRO Recent Results (from the past 240 hour(s))  Pneumocystis smear by DFA     Status: None   Collection Time: 10/16/17 10:59 AM  Result Value Ref Range Status   Specimen Source-PJSRC SPUTUM  Final   Pneumocystis jiroveci Ag SEE SEPARATE REPORT  Final      Lab Results  Component Value Date   CALCIUM 10.1 10/25/2017   CAION 1.18 08/08/2017         Trend Creat 2018   0.9=> 1.8=> 1.7=> 1.49=>1.36  Sodium 2018  137=> 127=>129=>129  Potassium 2018  7.0=> 5.9         Impression: 1)Renal  AKI secondary to Prerenal/ATN                AKI sec to bactrim/negative by 10 liters since admission                AKI sec to uncontrolled hyperglycemia causing hypovolemia                AKi now better                Creat trending down    2)HTN  BP stable  3)Anemia HGb at goal (9--11) Pt Hgb  11.3=> 14.0  Hypovolemia from polyuria from uncontrolled DM causing hemoconcentration  4)ID admitted with HCAP          Hx of HIV          On broad spectrum abx   5)Hyponatremia       Sec to Hyperglycemia/Hypovolemia       Quetiapine and Valproic acif may have some contribution  6)hyperkalemia      AKI + Uncontrolled DM + Bactrim    7)Acid base Co2 not at goal   8) Endo- DM-primary team following    Plan:  I did try to discuss with pt about need of IVF but pt says " No I am okay"  I also discussed  with RN to help educate the pt. If pt agrees-will suggest NS at 153ml/hr + 1 dose of lasix for hyperkalemia If pt doe not agrees for IVF then ill suggest po kayexalate for hyperkalemia     BHUTANI,MANPREET S 10/25/2017, 6:22 PM

## 2017-10-25 NOTE — Progress Notes (Signed)
Patient has refused high flow [place] throughout the shift, O2 placed on patient numerous times, and patient takes off, patient removed IV during the night of 11/30 and has refused IV to be replaced throughout the shift. Curses out at times, and states she just wants to be left alone. Patient does take medications after a lot of encouragement to take medications so she can get better. Dr. Jerilee Hoh, hospitalist aware of patient's noncompliance.

## 2017-10-25 NOTE — Progress Notes (Signed)
PROGRESS NOTE    Karina Clarke  NWG:[id number] DOB: 09-Mar-1961 DOA: 10/12/2017 PCP: Rosita Fire, MD     Brief Narrative:  56 year old woman admitted from a group home on 11/18 due to shortness of breath.  Found to have extensive bilateral pneumonia, still with high oxygen requirements.  Past medical history significant for HIV/AIDS and schizophrenia.  Overnight on 11/29 was noted to be hyperkalemic with acute renal failure and CBG above 600.  She is not known to be a diabetic.   Assessment & Plan:   Active Problems:   Sepsis ([place])   Tobacco use disorder   HIV (human immunodeficiency virus infection) ([place])   Schizophrenia ([place])   HCAP (healthcare-associated pneumonia)   Acute respiratory failure with hypoxia ([place])   AIDS ([place])   COPD exacerbation ([place])   Diabetes mellitus, new onset ([place])   Hyperkalemia   ARF (acute renal failure) ([place])   Acute hypoxemic respiratory failure -Mainly due to community-acquired pneumonia, possibly a very small component of mild pulmonary edema from aggressive volume resuscitation upon initial arrival. -Echo with ejection fraction of 65-70% with grade 1 diastolic dysfunction and no wall motion abnormalities.  She is currently not receiving scheduled Lasix. -She has been pulling off her oxygen and refusing treatments.  Have asked RN to check O2 sats on room air and surprisingly she is 93%.  We will keep off oxygen for now. -CT angiogram without PE although she does have dependent consolidation with bilateral lower lobes and right upper lobe. -Influenza PCR screen was negative. -Plan to continue Augmentin and transition steroids to p.o. as she has lost her IV and refuses reinsertion. -Appreciate Dr. Luan Pulling input and recommendations.  Community-acquired pneumonia -Continue Augmentin, she is also being treated for PCP pneumonia given her HIV with CD4 count of 150, continue Bactrim and steroids for now.  HIV/AIDS -Last known CD4 count was 150 in  September 2018, she follows outpatient with the ID department at [facility] -Starting to be a little combative, refusing treatments, continue psychotropic medications.  Hyperkalemia/acute renal failure -Appreciate nephrology input and recommendations, they suspect patient is hemoconcentrated, plan to continue current IV fluids, also hyperkalemia may be related to significant hyperglycemia and has continued to improve throughout the day.  If hyperkalemia not improving by tomorrow, will discuss alternate treatment for PCP with ID.  New onset diabetes -Was noted on BMET 11/30 to have a glucose of above 600. -She is not known to be a diabetic, she has been on steroids throughout this admission. -Was initially placed on glucose stabilizer, has been transitioned over to Lantus. We will increase Lantus from 20-25 units today.-  DVT prophylaxis: Lovenox Code Status: Full code Family Communication: Patient only Disposition Plan: Anticipate back to group home once medically stable  Consultants:   Pulmonary, Dr. Luan Pulling  Procedures:   None  Antimicrobials:  Anti-infectives (From admission, onward)   Start     Dose/Rate Route Frequency Ordered Stop   10/22/17 1000  amoxicillin-clavulanate (AUGMENTIN) 875-125 MG per tablet 1 tablet     1 tablet Oral Every 12 hours 10/21/17 1521     10/18/17 1100  piperacillin-tazobactam (ZOSYN) IVPB 3.375 g     3.375 g 12.5 mL/hr over 240 Minutes Intravenous Every 8 hours 10/18/17 1039 10/22/17 0642   10/15/17 1600  sulfamethoxazole-trimethoprim (BACTRIM DS,SEPTRA DS) 800-160 MG per tablet 3 tablet  Status:  Discontinued     3 tablet Oral 3 times daily 10/15/17 1008 10/15/17 1017   10/15/17 1600  sulfamethoxazole-trimethoprim (BACTRIM  DS,SEPTRA DS) 800-160 MG per tablet 2 tablet     2 tablet Oral 3 times daily 10/15/17 1017     10/15/17 1030  sulfamethoxazole-trimethoprim (BACTRIM DS,SEPTRA DS) 800-160 MG per tablet 1 tablet     1  tablet Oral  Once 10/15/17 1018 10/15/17 1045   10/14/17 1215  sulfamethoxazole-trimethoprim (BACTRIM DS,SEPTRA DS) 800-160 MG per tablet 1 tablet  Status:  Discontinued     1 tablet Oral Every 12 hours 10/14/17 1214 10/15/17 1008   10/13/17 2200  amoxicillin-clavulanate (AUGMENTIN) 875-125 MG per tablet 1 tablet  Status:  Discontinued     1 tablet Oral Every 12 hours 10/13/17 1750 10/18/17 1032   10/13/17 1000  abacavir-lamiVUDine (EPZICOM) 600-300 MG per tablet 1 tablet  Status:  Discontinued     1 tablet Oral Daily 10/12/17 1634 10/13/17 0824   10/13/17 1000  valACYclovir (VALTREX) tablet 1,000 mg     1,000 mg Oral Daily 10/12/17 1634     10/13/17 1000  abacavir (ZIAGEN) tablet 600 mg     600 mg Oral Daily 10/13/17 0824     10/13/17 1000  lamiVUDine (EPIVIR) tablet 300 mg     300 mg Oral Daily 10/13/17 0824     10/13/17 0800  atazanavir (REYATAZ) capsule 400 mg     400 mg Oral Daily with breakfast 10/12/17 1634     10/13/17 0100  ceFEPIme (MAXIPIME) 1 g in dextrose 5 % 50 mL IVPB  Status:  Discontinued     1 g 100 mL/hr over 30 Minutes Intravenous Every 8 hours 10/12/17 1732 10/13/17 1750   10/12/17 2200  vancomycin (VANCOCIN) IVPB 750 mg/150 ml premix  Status:  Discontinued     750 mg 150 mL/hr over 60 Minutes Intravenous Every 12 hours 10/12/17 1258 10/12/17 1634   10/12/17 2200  raltegravir (ISENTRESS) tablet 400 mg     400 mg Oral 2 times daily 10/12/17 1634     10/12/17 1700  piperacillin-tazobactam (ZOSYN) IVPB 3.375 g  Status:  Discontinued     3.375 g 12.5 mL/hr over 240 Minutes Intravenous Every 8 hours 10/12/17 1258 10/12/17 1634   10/12/17 1700  ceFEPIme (MAXIPIME) 2 g in dextrose 5 % 50 mL IVPB     2 g 100 mL/hr over 30 Minutes Intravenous  Once 10/12/17 1634 10/12/17 1733   10/12/17 1645  vancomycin (VANCOCIN) IVPB 1000 mg/200 mL premix  Status:  Discontinued     1,000 mg 200 mL/hr over 60 Minutes Intravenous  Once 10/12/17 1634 10/12/17 1657   10/12/17 1230   vancomycin (VANCOCIN) IVPB 1000 mg/200 mL premix     1,000 mg 200 mL/hr over 60 Minutes Intravenous  Once 10/12/17 1229 10/12/17 1404   10/12/17 1030  piperacillin-tazobactam (ZOSYN) IVPB 3.375 g     3.375 g 100 mL/hr over 30 Minutes Intravenous  Once 10/12/17 1025 10/12/17 1113   10/12/17 1015  sulfamethoxazole-trimethoprim (BACTRIM DS,SEPTRA DS) 800-160 MG per tablet 1 tablet     1 tablet Oral  Once 10/12/17 1003 10/12/17 1020   10/12/17 1000  cefTRIAXone (ROCEPHIN) 1 g in dextrose 5 % 50 mL IVPB  Status:  Discontinued     1 g 100 mL/hr over 30 Minutes Intravenous  Once 10/12/17 0951 10/12/17 0952   10/12/17 1000  azithromycin (ZITHROMAX) 500 mg in dextrose 5 % 250 mL IVPB  Status:  Discontinued     500 mg 250 mL/hr over 60 Minutes Intravenous  Once 10/12/17 0951 10/12/17 0952   10/12/17 1000  vancomycin (VANCOCIN) IVPB 1000 mg/200 mL premix     1,000 mg 200 mL/hr over 60 Minutes Intravenous  Once 10/12/17 0954 10/12/17 1139   10/12/17 1000  piperacillin-tazobactam (ZOSYN) IVPB 2.25 g  Status:  Discontinued     2.25 g 100 mL/hr over 30 Minutes Intravenous  Once 10/12/17 0954 10/12/17 1025       Subjective: More combative and aggressive today, upset that she has not been given anything to eat  Objective: Vitals:   10/25/17 0500 10/25/17 0800 10/25/17 1033 10/25/17 1422  BP: 123/65     Pulse: 85 82    Resp: 20 19    Temp: 98.4 F (36.9 C)     TempSrc: Oral     SpO2: 90% 92% 93% (!) 87%  Weight:      Height:        Intake/Output Summary (Last 24 hours) at 10/25/2017 1651 Last data filed at 10/25/2017 0900 Gross per 24 hour  Intake 0 ml  Output -  Net 0 ml   Filed Weights   10/12/17 1200 10/12/17 1506  Weight: 99.8 kg (220 lb) 108.3 kg (238 lb 12.1 oz)    Examination:  General exam: Alert, awake, oriented x 2, flat affect, does not make eye contact Respiratory system: Clear to auscultation. Respiratory effort normal. Cardiovascular system:RRR. No murmurs, rubs,  gallops. Gastrointestinal system: Abdomen is nondistended, soft and nontender. No organomegaly or masses felt. Normal bowel sounds heard. Central nervous system: Alert and oriented. No focal neurological deficits. Extremities: No C/C/E, +pedal pulses Skin: No rashes, lesions or ulcers     Data Reviewed: I have personally reviewed following labs and imaging studies  CBC: Recent Labs  Lab 10/24/17 0428 10/25/17 1130  WBC 5.9 7.1  HGB 14.0 14.3  HCT 43.1 43.1  MCV 93.1 92.9  PLT 232 707   Basic Metabolic Panel: Recent Labs  Lab 10/24/17 0428 10/24/17 0648 10/24/17 1024 10/24/17 1637 10/25/17 1130  NA 125* 126* 127* 129* 126*  K 7.0* 6.4* 5.9* 6.0* 5.8*  CL 92* 93* 94* 98* 95*  CO2 20* 19* 21* 21* 19*  GLUCOSE 680* 599* 478* 251* 376*  BUN 58* 56* 50* 43* 39*  CREATININE 1.85* 1.86* 1.74* 1.49* 1.36*  CALCIUM 10.0 10.2 9.7 9.9 10.1   GFR: Estimated Creatinine Clearance: 64.1 mL/min (A) (by C-G formula based on SCr of 1.36 mg/dL (H)). Liver Function Tests: No results for input(s): AST, ALT, ALKPHOS, BILITOT, PROT, ALBUMIN in the last 168 hours. No results for input(s): LIPASE, AMYLASE in the last 168 hours. No results for input(s): AMMONIA in the last 168 hours. Coagulation Profile: No results for input(s): INR, PROTIME in the last 168 hours. Cardiac Enzymes: No results for input(s): CKTOTAL, CKMB, CKMBINDEX, TROPONINI in the last 168 hours. BNP (last 3 results) No results for input(s): PROBNP in the last 8760 hours. HbA1C: No results for input(s): HGBA1C in the last 72 hours. CBG: Recent Labs  Lab 10/25/17 0422 10/25/17 0634 10/25/17 0749 10/25/17 1147 10/25/17 1643  GLUCAP 432* 350* 370* 334* 195*   Lipid Profile: No results for input(s): CHOL, HDL, LDLCALC, TRIG, CHOLHDL, LDLDIRECT in the last 72 hours. Thyroid Function Tests: No results for input(s): TSH, T4TOTAL, FREET4, T3FREE, THYROIDAB in the last 72 hours. Anemia Panel: No results for input(s):  VITAMINB12, FOLATE, FERRITIN, TIBC, IRON, RETICCTPCT in the last 72 hours. Urine analysis:    Component Value Date/Time   COLORURINE YELLOW 10/12/2017 [place] 10/12/2017 0944   LABSPEC  1.017 10/12/2017 0944   PHURINE 5.0 10/12/2017 [place] 10/12/2017 [place] 10/12/2017 [place] 10/12/2017 [place] 10/12/2017 0944   PROTEINUR NEGATIVE 10/12/2017 0944   NITRITE NEGATIVE 10/12/2017 0944   LEUKOCYTESUR NEGATIVE 10/12/2017 0944   Sepsis Labs: @LABRCNTIP (procalcitonin:4,lacticidven:4)  ) Recent Results (from the past 240 hour(s))  Pneumocystis smear by DFA     Status: None   Collection Time: 10/16/17 10:59 AM  Result Value Ref Range Status   Specimen Source-PJSRC SPUTUM  Final   Pneumocystis jiroveci Ag SEE SEPARATE REPORT  Final         Radiology Studies: US Renal  Result Date: 10/25/2017 CLINICAL DATA:  Acute tubular necrosis. Acute renal failure. Diabetes. Sepsis. HIV. EXAM: RENAL / URINARY TRACT ULTRASOUND COMPLETE COMPARISON:  None. FINDINGS: Suboptimal exam due to patient is combativeness and inability to cooperate during the exam. Patient refused allowed once imaging of the left kidney and bladder. Wimmer Right Kidney: Length: 10.8 cm. Mildly increased parenchymal echogenicity. No mass or hydronephrosis visualized. Left Kidney: Could not be imaged due to patient's combativeness and noncompliance during exam. Bladder: Could not be imaged due to patient's combativeness and noncompliance during exam. IMPRESSION: Mildly increased renal parenchymal echogenicity, consistent with medical renal disease. No evidence of hydronephrosis. Left kidney and bladder could not be imaged on this exam as discussed above. Electronically Signed   By: Earle Gell M.D.   On: 10/25/2017 11:18        Scheduled Meds: . abacavir  600 mg Oral Daily   And  . lamiVUDine  300 mg Oral Daily  . amoxicillin-clavulanate   1 tablet Oral Q12H  . atazanavir  400 mg Oral Q breakfast  . atorvastatin  10 mg Oral BID  . calcium gluconate  1 g Intravenous Once  . divalproex  500 mg Oral 2 times per day  . enoxaparin (LOVENOX) injection  40 mg Subcutaneous Q24H  . guaiFENesin  1,200 mg Oral BID  . haloperidol  20 mg Oral TID  . hydrOXYzine  25 mg Oral QHS  . insulin aspart  0-20 Units Subcutaneous TID WC  . insulin aspart  0-5 Units Subcutaneous QHS  . insulin aspart  6 Units Subcutaneous TID WC  . insulin detemir  20 Units Subcutaneous Q breakfast  . ipratropium-albuterol  3 mL Nebulization TID  . magnesium oxide  400 mg Oral Daily  . mouth rinse  15 mL Mouth Rinse BID  . [START ON 10/26/2017] predniSONE  5 mg Oral Q breakfast  . QUEtiapine  100 mg Oral 2 times per day  . raltegravir  400 mg Oral BID  . sulfamethoxazole-trimethoprim  2 tablet Oral TID  . trihexyphenidyl  2 mg Oral BID WC  . valACYclovir  1,000 mg Oral Daily  . zolpidem  5 mg Oral QHS   Continuous Infusions: . sodium chloride 125 mL/hr at 10/24/17 1006  . dextrose 5 % and 0.45% NaCl       LOS: 13 days    Time spent: 25 minutes. Greater than 50% of this time was spent in direct contact with the patient coordinating care.     Lelon Frohlich, MD [facility] Pager [phone]  If 7PM-7AM, please contact night-coverage [email or web address] Password [facility] 10/25/2017, 4:51 PM

## 2017-10-26 LAB — GLUCOSE, CAPILLARY
GLUCOSE-CAPILLARY: 125 mg/dL — AB (ref 65–99)
GLUCOSE-CAPILLARY: 145 mg/dL — AB (ref 65–99)
GLUCOSE-CAPILLARY: 342 mg/dL — AB (ref 65–99)
GLUCOSE-CAPILLARY: 371 mg/dL — AB (ref 65–99)
Glucose-Capillary: 358 mg/dL — ABNORMAL HIGH (ref 65–99)
Glucose-Capillary: 96 mg/dL (ref 65–99)

## 2017-10-26 LAB — CBC
HCT: 45.1 % (ref 36.0–46.0)
HEMOGLOBIN: 14.7 g/dL (ref 12.0–15.0)
MCH: 30.2 pg (ref 26.0–34.0)
MCHC: 32.6 g/dL (ref 30.0–36.0)
MCV: 92.6 fL (ref 78.0–100.0)
PLATELETS: 194 10*3/uL (ref 150–400)
RBC: 4.87 MIL/uL (ref 3.87–5.11)
RDW: 13.7 % (ref 11.5–15.5)
WBC: 6.1 10*3/uL (ref 4.0–10.5)

## 2017-10-26 LAB — BASIC METABOLIC PANEL
ANION GAP: 10 (ref 5–15)
BUN: 35 mg/dL — ABNORMAL HIGH (ref 6–20)
CO2: 20 mmol/L — ABNORMAL LOW (ref 22–32)
Calcium: 10 mg/dL (ref 8.9–10.3)
Chloride: 95 mmol/L — ABNORMAL LOW (ref 101–111)
Creatinine, Ser: 1.32 mg/dL — ABNORMAL HIGH (ref 0.44–1.00)
GFR, EST AFRICAN AMERICAN: 52 mL/min — AB (ref >60)
GFR, EST NON AFRICAN AMERICAN: 45 mL/min — AB (ref >60)
GLUCOSE: 323 mg/dL — AB (ref 65–99)
Potassium: 5.9 mmol/L — ABNORMAL HIGH (ref 3.5–5.1)
Sodium: 125 mmol/L — ABNORMAL LOW (ref 135–145)

## 2017-10-26 LAB — HEMOGLOBIN A1C
Hgb A1c MFr Bld: 9 % — ABNORMAL HIGH (ref 4.8–5.6)
MEAN PLASMA GLUCOSE: 212 mg/dL

## 2017-10-26 MED ORDER — INSULIN DETEMIR 100 UNIT/ML ~~LOC~~ SOLN
33.0000 [IU] | Freq: Every day | SUBCUTANEOUS | Status: DC
  Filled 2017-10-26: qty 0.33

## 2017-10-26 MED ORDER — PRIMAQUINE PHOSPHATE 26.3 MG PO TABS
30.0000 mg | ORAL_TABLET | Freq: Every day | ORAL | Status: DC
  Administered 2017-10-26 – 2017-10-27 (×2): 30 mg via ORAL
  Filled 2017-10-26 (×4): qty 2

## 2017-10-26 MED ORDER — SODIUM POLYSTYRENE SULFONATE 15 GM/60ML PO SUSP
30.0000 g | ORAL | Status: AC
Start: 2017-10-26 — End: 2017-10-26
  Administered 2017-10-26 (×2): 30 g via ORAL
  Filled 2017-10-26 (×2): qty 120

## 2017-10-26 MED ORDER — IPRATROPIUM-ALBUTEROL 0.5-2.5 (3) MG/3ML IN SOLN
3.0000 mL | RESPIRATORY_TRACT | Status: DC | PRN
  Filled 2017-10-26: qty 3

## 2017-10-26 MED ORDER — SODIUM CHLORIDE 0.9 % IV BOLUS (SEPSIS): 500.0000 mL | Freq: Once | INTRAVENOUS | Status: DC

## 2017-10-26 MED ORDER — CLINDAMYCIN HCL 150 MG PO CAPS
600.0000 mg | ORAL_CAPSULE | Freq: Three times a day (TID) | ORAL | Status: DC
  Administered 2017-10-26 – 2017-10-27 (×4): 600 mg via ORAL
  Filled 2017-10-26 (×4): qty 4

## 2017-10-26 NOTE — Progress Notes (Signed)
Patient disoriented, easily agitated. Patient refused O2 [place] for this RN. RT did convince patient to use O2 [place], but patient repeatedly takes O2 [place] off. Patient states that she does not want it and that it is going to "kill" her. Patient not accepting of education. Patient cooperative with assessment and taking HS meds. Patient denied pain, but then complained of leg pain shortly later. Tylenol given PO per PRN orders. Patient c/o nausea, but refused Zofran. Patient refusing tele. Patient has refused IV access so IV fluids not running. Will continue to monitor.

## 2017-10-26 NOTE — Progress Notes (Addendum)
[place], NP notified of hypotension, tachycardia, diaphoretic. Patient lethargic, but eventually did awake after a few minutes. Patient A&O to person only, which is the same as previous. BG was 125. SpO2 82% on RA. Patient repeatedly takes O2 off, refusing. O2 4L HFNC applied. SpO2 only increased to 90%. RRT assessed patient and increased O2 to 8L HFNC. No IV site d/t patient refusal. Blount, NP aware. Steffanie Dunn, Agricultural consultant attempted to get IV access. Attempts x2 unsuccessful. ICU RN will attempt if able. Will continue to monitor.

## 2017-10-26 NOTE — Progress Notes (Signed)
Patient placed on 7-8 liters high flow oxygen nasal cannula. Saturation increased slow to 98. Will titrate oxygen. Assessment of patient breath sounds, lungs appear clear with out wheezes , crackles or rhonchi. Patient does not like to leave oxygen on and will remove if bothered by it. History shows mild learning disability. Not sure what mild means. Patient appears to have the mental ability of about a 44-54 year old child.  Stated when nurse tried to place IV that ," she was going to tell my mommy that we hurt her small hand". She also has schizophrenia. Respiratory will continue to monitor and try to care for patient as best as she will allow.

## 2017-10-26 NOTE — Progress Notes (Signed)
Subjective: Interval History: has complaints nausea but no vomiting.  Patient complains of all these medications she is taking is bothering her stomach and she does not want to take any.  Denies any difficulty breathing..  Objective: Vital signs in last 24 hours: Temp:  [98.6 F (37 C)-99 F (37.2 C)] 99 F (37.2 C) (12/02 0400) Pulse Rate:  [87-108] 101 (12/02 0813) Resp:  [18-20] 18 (12/02 0813) BP: (111-152)/(67-94) 111/83 (12/02 0400) SpO2:  [87 %-95 %] 94 % (12/02 0813) Weight change:   Intake/Output from previous day: 12/01 0701 - 12/02 0700 In: 720 [P.O.:720] Out: 500 [Urine:500] Intake/Output this shift: Total I/O In: 120 [P.O.:120] Out: -   General appearance: alert, cooperative and no distress Resp: clear to auscultation bilaterally Cardio: regular rate and rhythm Extremities: No edema  Lab Results: Recent Labs    10/25/17 1130 10/26/17 0454  WBC 7.1 6.1  HGB 14.3 14.7  HCT 43.1 45.1  PLT 190 194   BMET:  Recent Labs    10/25/17 1130 10/26/17 0454  NA 126* 125*  K 5.8* 5.9*  CL 95* 95*  CO2 19* 20*  GLUCOSE 376* 323*  BUN 39* 35*  CREATININE 1.36* 1.32*  CALCIUM 10.1 10.0   No results for input(s): PTH in the last 72 hours. Iron Studies: No results for input(s): IRON, TIBC, TRANSFERRIN, FERRITIN in the last 72 hours.  Studies/Results: US Renal  Result Date: 10/25/2017 CLINICAL DATA:  Acute tubular necrosis. Acute renal failure. Diabetes. Sepsis. HIV. EXAM: RENAL / URINARY TRACT ULTRASOUND COMPLETE COMPARISON:  None. FINDINGS: Suboptimal exam due to patient is combativeness and inability to cooperate during the exam. Patient refused allowed once imaging of the left kidney and bladder. [place]immer Right Kidney: Length: 10.8 cm. Mildly increased parenchymal echogenicity. No mass or hydronephrosis visualized. Left Kidney: Could not be imaged due to patient's combativeness and noncompliance during exam. Bladder: Could not be imaged due to patient's  combativeness and noncompliance during exam. IMPRESSION: Mildly increased renal parenchymal echogenicity, consistent with medical renal disease. No evidence of hydronephrosis. Left kidney and bladder could not be imaged on this exam as discussed above. Electronically Signed   By: Earle Gell M.D.   On: 10/25/2017 11:18    I have reviewed the patient's current medications.  Assessment/Plan: Problem #1 hyperkalemia: Possibly secondary to high potassium intake/worsening of renal failure/type IV RTA/Bactrim which was discontinued.  Presently her potassium remains high.  Patient refused IV fluid. 2] hyponatremia: Possibly hypovolemic hyponatremia her sodium is 125 slightly low  3] renal failure possibly acute.  Renal function is slightly better.  Ultrasound of the kidneys did not show any hydronephrosis. 4] history of schizophrenia 5] history of AIDS 6] history of H CAP. 7] diabetes: Her blood sugar is high Plan:1] I discussed with the patient about starting her on IV fluid and using diuretics but patient refused.-She claims she is feeling good . She states that all these medications which she is taking does not help her and it makes her sick.  I told her about the risk of high potassium and will give her some Kayexalate.  At this moment she does not seem to be willing to take medications. 2] will decrease free water intake 3] we will try giving her Kayexalate 30 g x2 doses. 4]Renal panel in am    LOS: 14 days   Karina Clarke S 10/26/2017,9:36 AM

## 2017-10-26 NOTE — Progress Notes (Signed)
Patient laying in bed, yelling out that her leg hurts. This RN offered Tylenol, Ibuprofen, and heat pack. Patient refused. Will continue to monitor.

## 2017-10-26 NOTE — Progress Notes (Signed)
PROGRESS NOTE    Karina Clarke  GNF:[id number] DOB: Apr 05, 1961 DOA: 10/12/2017 PCP: Rosita Fire, MD     Brief Narrative:  56 year old woman admitted from a group home on 11/18 due to shortness of breath.  Found to have extensive bilateral pneumonia, still with high oxygen requirements.  Past medical history significant for HIV/AIDS and schizophrenia.  Overnight on 11/29 was noted to be hyperkalemic with acute renal failure and CBG above 600.  She is not known to be a diabetic.   Assessment & Plan:   Active Problems:   Sepsis ([place])   Tobacco use disorder   HIV (human immunodeficiency virus infection) ([place])   Schizophrenia ([place])   HCAP (healthcare-associated pneumonia)   Acute respiratory failure with hypoxia ([place])   AIDS ([place])   COPD exacerbation ([place])   Diabetes mellitus, new onset ([place])   Hyperkalemia   ARF (acute renal failure) ([place])   Acute hypoxemic respiratory failure -Mainly due to community-acquired pneumonia, possibly a very small component of mild pulmonary edema from aggressive volume resuscitation upon initial arrival. -Echo with ejection fraction of 65-70% with grade 1 diastolic dysfunction and no wall motion abnormalities.  She is currently not receiving scheduled Lasix. -She has been pulling off her oxygen and refusing treatments.  -She remains with oxygen saturations in the low to mid oxygen use. -CT angiogram without PE although she does have dependent consolidation with bilateral lower lobes and right upper lobe. -Influenza PCR screen was negative. -Plan to continue Augmentin and PO steroids.  Community-acquired pneumonia -Continue Augmentin, she is also being treated for PCP pneumonia given her HIV with CD4 count of 150,  -Due to acute renal failure and hyperkalemia Bactrim for PCP pneumonia will be transitioned over to clindamycin and Primaquine, have discussed this with ID Dr. Legrand Como.  HIV/AIDS -Last known CD4 count was 150 in September 2018, she  follows outpatient with the ID department at [facility] -Starting to be a little combative, refusing treatments, continue psychotropic medications.  Hyperkalemia/acute renal failure -Appreciate nephrology input and recommendations, they suspect patient is hemoconcentrated,  also hyperkalemia may be related to significant hyperglycemia and has continued to improve throughout the day.   -Refuses IVF and all medications including kayexalate.  New onset diabetes -Was noted on BMET 11/30 to have a glucose of above 600. -She is not known to be a diabetic, she has been on steroids throughout this admission. -Was initially placed on glucose stabilizer, has been transitioned over to Lantus. We will increase Lantus from 25-33 units today.-  DVT prophylaxis: Lovenox Code Status: Full code Family Communication: Patient only Disposition Plan: Anticipate back to group home once medically stable in 24-48 hours  Consultants:   Pulmonary, Dr. Luan Pulling  Nephrology  Procedures:   None  Antimicrobials:  Anti-infectives (From admission, onward)   Start     Dose/Rate Route Frequency Ordered Stop   10/26/17 1200  clindamycin (CLEOCIN) capsule 600 mg     600 mg Oral Every 8 hours 10/26/17 0852 11/05/17 1359   10/26/17 1200  primaquine tablet 30 mg     30 mg Oral Daily 10/26/17 0852 11/05/17 0959   10/22/17 1000  amoxicillin-clavulanate (AUGMENTIN) 875-125 MG per tablet 1 tablet     1 tablet Oral Every 12 hours 10/21/17 1521     10/18/17 1100  piperacillin-tazobactam (ZOSYN) IVPB 3.375 g     3.375 g 12.5 mL/hr over 240 Minutes Intravenous Every 8 hours 10/18/17 1039 10/22/17 0642   10/15/17 1600  sulfamethoxazole-trimethoprim (BACTRIM DS,SEPTRA  DS) 800-160 MG per tablet 3 tablet  Status:  Discontinued     3 tablet Oral 3 times daily 10/15/17 1008 10/15/17 1017   10/15/17 1600  sulfamethoxazole-trimethoprim (BACTRIM DS,SEPTRA DS) 800-160 MG per tablet 2 tablet  Status:   Discontinued     2 tablet Oral 3 times daily 10/15/17 1017 10/26/17 0852   10/15/17 1030  sulfamethoxazole-trimethoprim (BACTRIM DS,SEPTRA DS) 800-160 MG per tablet 1 tablet     1 tablet Oral  Once 10/15/17 1018 10/15/17 1045   10/14/17 1215  sulfamethoxazole-trimethoprim (BACTRIM DS,SEPTRA DS) 800-160 MG per tablet 1 tablet  Status:  Discontinued     1 tablet Oral Every 12 hours 10/14/17 1214 10/15/17 1008   10/13/17 2200  amoxicillin-clavulanate (AUGMENTIN) 875-125 MG per tablet 1 tablet  Status:  Discontinued     1 tablet Oral Every 12 hours 10/13/17 1750 10/18/17 1032   10/13/17 1000  abacavir-lamiVUDine (EPZICOM) 600-300 MG per tablet 1 tablet  Status:  Discontinued     1 tablet Oral Daily 10/12/17 1634 10/13/17 0824   10/13/17 1000  valACYclovir (VALTREX) tablet 1,000 mg     1,000 mg Oral Daily 10/12/17 1634     10/13/17 1000  abacavir (ZIAGEN) tablet 600 mg     600 mg Oral Daily 10/13/17 0824     10/13/17 1000  lamiVUDine (EPIVIR) tablet 300 mg     300 mg Oral Daily 10/13/17 0824     10/13/17 0800  atazanavir (REYATAZ) capsule 400 mg     400 mg Oral Daily with breakfast 10/12/17 1634     10/13/17 0100  ceFEPIme (MAXIPIME) 1 g in dextrose 5 % 50 mL IVPB  Status:  Discontinued     1 g 100 mL/hr over 30 Minutes Intravenous Every 8 hours 10/12/17 1732 10/13/17 1750   10/12/17 2200  vancomycin (VANCOCIN) IVPB 750 mg/150 ml premix  Status:  Discontinued     750 mg 150 mL/hr over 60 Minutes Intravenous Every 12 hours 10/12/17 1258 10/12/17 1634   10/12/17 2200  raltegravir (ISENTRESS) tablet 400 mg     400 mg Oral 2 times daily 10/12/17 1634     10/12/17 1700  piperacillin-tazobactam (ZOSYN) IVPB 3.375 g  Status:  Discontinued     3.375 g 12.5 mL/hr over 240 Minutes Intravenous Every 8 hours 10/12/17 1258 10/12/17 1634   10/12/17 1700  ceFEPIme (MAXIPIME) 2 g in dextrose 5 % 50 mL IVPB     2 g 100 mL/hr over 30 Minutes Intravenous  Once 10/12/17 1634 10/12/17 1733   10/12/17 1645   vancomycin (VANCOCIN) IVPB 1000 mg/200 mL premix  Status:  Discontinued     1,000 mg 200 mL/hr over 60 Minutes Intravenous  Once 10/12/17 1634 10/12/17 1657   10/12/17 1230  vancomycin (VANCOCIN) IVPB 1000 mg/200 mL premix     1,000 mg 200 mL/hr over 60 Minutes Intravenous  Once 10/12/17 1229 10/12/17 1404   10/12/17 1030  piperacillin-tazobactam (ZOSYN) IVPB 3.375 g     3.375 g 100 mL/hr over 30 Minutes Intravenous  Once 10/12/17 1025 10/12/17 1113   10/12/17 1015  sulfamethoxazole-trimethoprim (BACTRIM DS,SEPTRA DS) 800-160 MG per tablet 1 tablet     1 tablet Oral  Once 10/12/17 1003 10/12/17 1020   10/12/17 1000  cefTRIAXone (ROCEPHIN) 1 g in dextrose 5 % 50 mL IVPB  Status:  Discontinued     1 g 100 mL/hr over 30 Minutes Intravenous  Once 10/12/17 0951 10/12/17 0952   10/12/17 1000  azithromycin (  ZITHROMAX) 500 mg in dextrose 5 % 250 mL IVPB  Status:  Discontinued     500 mg 250 mL/hr over 60 Minutes Intravenous  Once 10/12/17 0951 10/12/17 0952   10/12/17 1000  vancomycin (VANCOCIN) IVPB 1000 mg/200 mL premix     1,000 mg 200 mL/hr over 60 Minutes Intravenous  Once 10/12/17 0954 10/12/17 1139   10/12/17 1000  piperacillin-tazobactam (ZOSYN) IVPB 2.25 g  Status:  Discontinued     2.25 g 100 mL/hr over 30 Minutes Intravenous  Once 10/12/17 0954 10/12/17 1025       Subjective: More combative and aggressive today refuses IV, medications, exam.  Objective: Vitals:   10/25/17 2103 10/26/17 0400 10/26/17 0430 10/26/17 0813  BP:  111/83    Pulse:  (!) 108  (!) 101  Resp:  20  18  Temp:  99 F (37.2 C)    TempSrc:  Oral    SpO2: 93% (!) 87% 95% 94%  Weight:      Height:        Intake/Output Summary (Last 24 hours) at 10/26/2017 1117 Last data filed at 10/26/2017 0900 Gross per 24 hour  Intake 840 ml  Output 500 ml  Net 340 ml   Filed Weights   10/12/17 1200 10/12/17 1506  Weight: 99.8 kg (220 lb) 108.3 kg (238 lb 12.1 oz)    Examination:  Patient does not allow me  to examine her today, however she generally appears well, as usual does not make eye contact, is alert and oriented, is not wearing oxygen.    Data Reviewed: I have personally reviewed following labs and imaging studies  CBC: Recent Labs  Lab 10/24/17 0428 10/25/17 1130 10/26/17 0454  WBC 5.9 7.1 6.1  HGB 14.0 14.3 14.7  HCT 43.1 43.1 45.1  MCV 93.1 92.9 92.6  PLT 232 190 983   Basic Metabolic Panel: Recent Labs  Lab 10/24/17 0648 10/24/17 1024 10/24/17 1637 10/25/17 1130 10/26/17 0454  NA 126* 127* 129* 126* 125*  K 6.4* 5.9* 6.0* 5.8* 5.9*  CL 93* 94* 98* 95* 95*  CO2 19* 21* 21* 19* 20*  GLUCOSE 599* 478* 251* 376* 323*  BUN 56* 50* 43* 39* 35*  CREATININE 1.86* 1.74* 1.49* 1.36* 1.32*  CALCIUM 10.2 9.7 9.9 10.1 10.0   GFR: Estimated Creatinine Clearance: 66 mL/min (A) (by C-G formula based on SCr of 1.32 mg/dL (H)). Liver Function Tests: No results for input(s): AST, ALT, ALKPHOS, BILITOT, PROT, ALBUMIN in the last 168 hours. No results for input(s): LIPASE, AMYLASE in the last 168 hours. No results for input(s): AMMONIA in the last 168 hours. Coagulation Profile: No results for input(s): INR, PROTIME in the last 168 hours. Cardiac Enzymes: No results for input(s): CKTOTAL, CKMB, CKMBINDEX, TROPONINI in the last 168 hours. BNP (last 3 results) No results for input(s): PROBNP in the last 8760 hours. HbA1C: No results for input(s): HGBA1C in the last 72 hours. CBG: Recent Labs  Lab 10/25/17 1643 10/25/17 2042 10/26/17 0005 10/26/17 0809 10/26/17 1109  GLUCAP 195* 286* 342* 371* 358*   Lipid Profile: No results for input(s): CHOL, HDL, LDLCALC, TRIG, CHOLHDL, LDLDIRECT in the last 72 hours. Thyroid Function Tests: No results for input(s): TSH, T4TOTAL, FREET4, T3FREE, THYROIDAB in the last 72 hours. Anemia Panel: No results for input(s): VITAMINB12, FOLATE, FERRITIN, TIBC, IRON, RETICCTPCT in the last 72 hours. Urine analysis:    Component Value  Date/Time   COLORURINE YELLOW 10/12/2017 [place] 10/12/2017 0944  LABSPEC 1.017 10/12/2017 0944   PHURINE 5.0 10/12/2017 [place] 10/12/2017 [place] 10/12/2017 [place] 10/12/2017 [place] 10/12/2017 0944   PROTEINUR NEGATIVE 10/12/2017 0944   NITRITE NEGATIVE 10/12/2017 0944   LEUKOCYTESUR NEGATIVE 10/12/2017 0944   Sepsis Labs: @LABRCNTIP (procalcitonin:4,lacticidven:4)  ) No results found for this or any previous visit (from the past 240 hour(s)).       Radiology Studies: US Renal  Result Date: 10/25/2017 CLINICAL DATA:  Acute tubular necrosis. Acute renal failure. Diabetes. Sepsis. HIV. EXAM: RENAL / URINARY TRACT ULTRASOUND COMPLETE COMPARISON:  None. FINDINGS: Suboptimal exam due to patient is combativeness and inability to cooperate during the exam. Patient refused allowed once imaging of the left kidney and bladder. [place] Right Kidney: Length: 10.8 cm. Mildly increased parenchymal echogenicity. No mass or hydronephrosis visualized. Left Kidney: Could not be imaged due to patient's combativeness and noncompliance during exam. Bladder: Could not be imaged due to patient's combativeness and noncompliance during exam. IMPRESSION: Mildly increased renal parenchymal echogenicity, consistent with medical renal disease. No evidence of hydronephrosis. Left kidney and bladder could not be imaged on this exam as discussed above. Electronically Signed   By: Earle Gell M.D.   On: 10/25/2017 11:18        Scheduled Meds: . abacavir  600 mg Oral Daily   And  . lamiVUDine  300 mg Oral Daily  . amoxicillin-clavulanate  1 tablet Oral Q12H  . atazanavir  400 mg Oral Q breakfast  . atorvastatin  10 mg Oral BID  . calcium gluconate  1 g Intravenous Once  . clindamycin  600 mg Oral Q8H  . divalproex  500 mg Oral 2 times per day  . enoxaparin (LOVENOX) injection  40 mg Subcutaneous Q24H  . guaiFENesin   1,200 mg Oral BID  . haloperidol  20 mg Oral TID  . hydrOXYzine  25 mg Oral QHS  . insulin aspart  0-20 Units Subcutaneous TID WC  . insulin aspart  0-5 Units Subcutaneous QHS  . insulin aspart  6 Units Subcutaneous TID WC  . insulin detemir  25 Units Subcutaneous Q breakfast  . magnesium oxide  400 mg Oral Daily  . mouth rinse  15 mL Mouth Rinse BID  . predniSONE  5 mg Oral Q breakfast  . primaquine  30 mg Oral Daily  . QUEtiapine  100 mg Oral 2 times per day  . raltegravir  400 mg Oral BID  . sodium polystyrene  30 g Oral Q4H  . trihexyphenidyl  2 mg Oral BID WC  . valACYclovir  1,000 mg Oral Daily  . zolpidem  5 mg Oral QHS   Continuous Infusions: . sodium chloride 125 mL/hr at 10/24/17 1006  . dextrose 5 % and 0.45% NaCl       LOS: 14 days    Time spent: 25 minutes. Greater than 50% of this time was spent in direct contact with the patient coordinating care.     Lelon Frohlich, MD [facility] Pager [phone]  If 7PM-7AM, please contact night-coverage [email or web address] Password [email or web address] 10/26/2017, 11:17 AM

## 2017-10-26 NOTE — Progress Notes (Signed)
Respiratory Care Note: The patient refused her breathing treatment and will not keep oxygen in her nose, SAT- 94%, HR-101. RN aware

## 2017-10-27 DIAGNOSIS — N170 Acute kidney failure with tubular necrosis: Secondary | ICD-10-CM

## 2017-10-27 LAB — GLUCOSE, CAPILLARY
Glucose-Capillary: 245 mg/dL — ABNORMAL HIGH (ref 65–99)
Glucose-Capillary: 251 mg/dL — ABNORMAL HIGH (ref 65–99)

## 2017-10-27 MED ORDER — AMOXICILLIN-POT CLAVULANATE 875-125 MG PO TABS: 1.0000 | ORAL_TABLET | Freq: Two times a day (BID) | ORAL | 0 refills | Status: AC

## 2017-10-27 MED ORDER — PREDNISONE 5 MG PO TABS: 5.0000 mg | ORAL_TABLET | Freq: Every day | ORAL | 0 refills | Status: AC

## 2017-10-27 MED ORDER — PRIMAQUINE PHOSPHATE 26.3 MG PO TABS: 30.0000 mg | ORAL_TABLET | Freq: Every day | ORAL | 0 refills | Status: AC

## 2017-10-27 MED ORDER — INSULIN DETEMIR 100 UNIT/ML ~~LOC~~ SOLN: 33.0000 [IU] | Freq: Every day | SUBCUTANEOUS | 11 refills | Status: DC

## 2017-10-27 MED ORDER — CLINDAMYCIN HCL 300 MG PO CAPS: 600.0000 mg | ORAL_CAPSULE | Freq: Three times a day (TID) | ORAL | 0 refills | Status: AC

## 2017-10-27 NOTE — Care Management Note (Signed)
Case Management Note  Patient Details  Name: Karina Clarke MRN: [id number] Date of Birth: [birth year]  :   Expected Discharge Date:  10/27/17               Expected Discharge Plan:  Assisted Living / Rest Home  In-House Referral:  Clinical Social Work  Discharge planning Services  CM Consult  Post Acute Care Choice:  Home Health Choice offered to:  [facility] POA / Guardian  DME Arranged:    DME Agency:     HH Arranged:  PT [place]:  [place]  Status of Service:  Completed, signed off  If discussed at [place] of Stay Meetings, dates discussed:    Additional Comments: Patient discharging back to group home. Recommended for [facility] PT. Facility would like [facility]. LInda [facility] notified. Patient is now 95% on room air. Per CSW patient already has supplemental oxygen at facility if needed.   Karina Clarke, Chauncey Reading, RN 10/27/2017, 3:09 PM

## 2017-10-27 NOTE — Discharge Summary (Signed)
Physician Discharge Summary  Karina Clarke JSE:[id number] DOB: 1960-12-01 DOA: 10/12/2017  PCP: Rosita Fire, MD  Admit date: 10/12/2017 Discharge date: 10/27/2017  Time spent: 45 minutes  Recommendations for Outpatient Follow-up:  -To be discharged back to group home today. -She currently does not have oxygen requirements. -To complete course of Augmentin for community-acquired pneumonia. -To complete course of clindamycin, primaquine, steroids for presumed PCP pneumonia. -She has been newly diagnosed as a diabetic and will need to continue insulin daily and close follow-up with PCP for management.  Discharge Diagnoses:  Active Problems:   Sepsis ([place])   Tobacco use disorder   HIV (human immunodeficiency virus infection) ([place])   Schizophrenia ([place])   HCAP (healthcare-associated pneumonia)   Acute respiratory failure with hypoxia ([place])   AIDS ([place])   COPD exacerbation ([place])   Diabetes mellitus, new onset ([place])   Hyperkalemia   ARF (acute renal failure) ([place])   Discharge Condition: Stable and improved  Filed Weights   10/12/17 1200 10/12/17 1506  Weight: 99.8 kg (220 lb) 108.3 kg (238 lb 12.1 oz)    History of present illness:  As per Dr. Roderic Palau on 11/18:  Karina Clarke is a 56 y.o. female with medical history significant of HIV/AIDS, schizophrenia, presents to the emergency room with complaints of shortness of breath.  She complains of having shortness of breath for the past 2 days.  She says associated cough and wheezing.  Cough is productive of green colored sputum.  She is also been having some fevers.  Her symptoms have gotten progressively worse.  No vomiting, diarrhea, sore throat.  She does have some substernal chest pain which is worse with coughing.  On arrival to the emergency room, she was noted to be in respiratory distress.  Oxygen was applied and she received bronchodilators.  Her respiratory status has since improved.  Chest x-ray indicated pneumonia.   She is been referred for admission.    Hospital Course:   Acute hypoxemic respiratory failure -Mainly due to community-acquired pneumonia, possibly a very small component of mild pulmonary edema from aggressive volume resuscitation upon initial arrival. -Echo with ejection fraction of 65-70% with grade 1 diastolic dysfunction and no wall motion abnormalities.  She is currently not receiving scheduled Lasix. -She has been pulling off her oxygen and refusing treatments.  -For the past 3 days she has not required any supplemental oxygen with oxygen saturations in the low to mid 90s.  May need oxygen as needed especially with activity. -CT angiogram without PE although she does have dependent consolidation with bilateral lower lobes and right upper lobe. -Influenza PCR screen was negative. -Plan to continue Augmentin for an extra 5 days.   Community-acquired pneumonia/questionable PCP pneumonia -Continue Augmentin for 5 more days post discharge., she is also being treated for PCP pneumonia given her HIV with CD4 count of 150,  -Due to acute renal failure and hyperkalemia Bactrim for PCP pneumonia will be transitioned over to clindamycin and Primaquine, have discussed this with ID Dr. Linus Salmons. -She has 10 days of clindamycin/primaquine/steroids pending at time of discharge.  HIV/AIDS -Last known CD4 count was 150 in September 2018, she follows outpatient with the ID department at [facility] outpatient follow-up with ID.  Schizophrenia -Starting to be a little combative, refusing treatments, labs.  Continue psychotropic medications.  Hyperkalemia/acute renal failure -Appreciate nephrology input and recommendations, they suspect patient is hemoconcentrated,  also hyperkalemia may be related to significant hyperglycemia. -Refuses IVF and all medications including kayexalate. -Have  not been able to recheck labs prior to discharge given her refusal, on last check her potassium was  5.8 and her creatinine was 1.32 and this was on 12/2.  New onset diabetes -Was noted on BMET 11/30 to have a glucose of above 600. -She is not known to be a diabetic, she has been on steroids throughout this admission. -Was initially placed on glucose stabilizer, has been transitioned over to Lantus. -We will continue Lantus 33 units on discharge, this will need to be closely monitored as an outpatient as I am positive that she will need escalating doses of insulin.    Procedures:  None   Consultations:  Pulmonary, Dr. Luan Pulling  Discharge Instructions  Discharge Instructions    Increase activity slowly   Complete by:  As directed      Allergies as of 10/27/2017      Reactions   Asa [aspirin] Nausea And Vomiting      Medication List    TAKE these medications   abacavir-lamiVUDine 600-300 MG tablet Commonly known as:  EPZICOM Take 1 tablet by mouth daily.   albuterol 108 (90 Base) MCG/ACT inhaler Commonly known as:  PROVENTIL HFA;VENTOLIN HFA Inhale 1-2 puffs into the lungs 2 (two) times daily as needed for wheezing or shortness of breath.   amoxicillin-clavulanate 875-125 MG tablet Commonly known as:  AUGMENTIN Take 1 tablet by mouth every 12 (twelve) hours for 5 days.   atazanavir 200 MG capsule Commonly known as:  REYATAZ Take 400 mg by mouth daily.   atorvastatin 10 MG tablet Commonly known as:  LIPITOR Take 10 mg by mouth 2 (two) times daily.   clindamycin 300 MG capsule Commonly known as:  CLEOCIN Take 2 capsules (600 mg total) by mouth every 8 (eight) hours for 10 days.   divalproex 500 MG DR tablet Commonly known as:  DEPAKOTE Take 500 mg by mouth See admin instructions. Take 1 tablet (500 mg) by mouth at 4pm and 8pm daily   haloperidol 10 MG tablet Commonly known as:  HALDOL Take 20 mg by mouth 3 (three) times daily.   haloperidol decanoate 100 MG/ML injection Commonly known as:  HALDOL DECANOATE Inject 200 mg into the muscle every 28  (twenty-eight) days. Last injection 06/09/15   hydrOXYzine 25 MG capsule Commonly known as:  VISTARIL Take 25 mg by mouth at bedtime.   insulin detemir 100 UNIT/ML injection Commonly known as:  LEVEMIR Inject 0.33 mLs (33 Units total) into the skin at bedtime.   Magnesium Oxide 400 MG Caps Take 1 capsule (400 mg total) by mouth every morning.   polyethylene glycol packet Commonly known as:  MIRALAX / GLYCOLAX Take 17 g by mouth daily as needed.   predniSONE 5 MG tablet Commonly known as:  DELTASONE Take 1 tablet (5 mg total) by mouth daily with breakfast for 10 days. Start taking on:  10/28/2017   primaquine 26.3 MG tablet Take 2 tablets (30 mg total) by mouth daily for 10 days. Start taking on:  10/28/2017   QUEtiapine 200 MG tablet Commonly known as:  SEROQUEL Take 100 mg by mouth See admin instructions. Take 1 tablet (200 mg) by mouth with supper and at bedtime daily   raltegravir 400 MG tablet Commonly known as:  ISENTRESS Take 400 mg by mouth 2 (two) times daily.   trihexyphenidyl 2 MG tablet Commonly known as:  ARTANE Take 2 mg by mouth 2 (two) times daily with a meal.   valACYclovir 1000 MG tablet Commonly known  as:  VALTREX Take 1,000 mg by mouth daily.   zolpidem 10 MG tablet Commonly known as:  AMBIEN Take 5 mg by mouth at bedtime.      Allergies  Allergen Reactions  . Asa [Aspirin] Nausea And Vomiting   Contact information for after-discharge care    Destination    [facility] .   Service:  Group Home Contact information: [address] [place] [phone]               The results of significant diagnostics from this hospitalization (including imaging, microbiology, ancillary and laboratory) are listed below for reference.    Significant Diagnostic Studies: Dg Chest 2 View  Result Date: 10/12/2017 CLINICAL DATA:  Code sepsis EXAM: CHEST - 2 VIEW COMPARISON:  08/08/2017 FINDINGS: Some increase in right  middle lobe infiltrate or atelectasis. Left lung clear. Heart size and mediastinal contours are within normal limits. No effusion.  No pneumothorax. Visualized bones unremarkable. IMPRESSION: 1. Increasing right middle lobe infiltrate/atelectasis. Electronically Signed   By: Lucrezia Europe M.D.   On: 10/12/2017 10:40   Ct Angio Chest Pe W Or Wo Contrast  Result Date: 10/17/2017 CLINICAL DATA:  Shortness of breath. EXAM: CT ANGIOGRAPHY CHEST WITH CONTRAST TECHNIQUE: Multidetector CT imaging of the chest was performed using the standard protocol during bolus administration of intravenous contrast. Multiplanar CT image reconstructions and MIPs were obtained to evaluate the vascular anatomy. CONTRAST:  128mL ISOVUE-370 IOPAMIDOL (ISOVUE-370) INJECTION 76% COMPARISON:  Radiograph 10/15/2017. Report from chest CT 09/07/2011, images not available. FINDINGS: Cardiovascular: Dilatation of the main pulmonary artery of 3.8 cm, and particularly the right and left main pulmonary arteries measuring 3.6 cm respectively. There are no intraluminal filling defects to the segmental level, subsegmental evaluation is limited due to poor distal perfusion. Multi chamber cardiomegaly with prominent right heart dilatation. Normal caliber abdominal aorta with minimal atherosclerosis. 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 No pericardial effusion. Mediastinum/Nodes: Small mediastinal nodes, largest 7 mm upper paratracheal. There is a calcified paraesophageal node adjacent to the mid-distal esophagus. No hilar adenopathy. The esophagus is decompressed. Lungs/Pleura: Small dependent consolidations in both lower lobes. Additional consolidation in the dependent right upper lobe with adjacent patchy ground-glass opacity. Scattered atelectasis in both lungs. No pleural fluid. Trachea and mainstem bronchi are patent, there is mild patient motion artifact. Upper Abdomen: Ovoid 20mm soft tissue density adjacent to the  splenic hilum and pancreatic tail may be a lymph node or splenule. Musculoskeletal: There are no acute or suspicious osseous abnormalities. Review of the MIP images confirms the above findings. IMPRESSION: 1. No pulmonary embolus in the segmental level. 2. Significantly dilated main pulmonary arteries consistent with pulmonary arterial hypertension. 3. Dependent consolidations within both lower lobes and the right upper lobe, pneumonia versus aspiration. Adjacent right upper lobe ground-glass opacities likely part of same process, less likely superimposed pulmonary edema. 4. 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 Aortic Atherosclerosis (ICD10-I70.0). Electronically Signed   By: Jeb Levering M.D.   On: 10/17/2017 20:56   US Renal  Result Date: 10/25/2017 CLINICAL DATA:  Acute tubular necrosis. Acute renal failure. Diabetes. Sepsis. HIV. EXAM: RENAL / URINARY TRACT ULTRASOUND COMPLETE COMPARISON:  None. FINDINGS: Suboptimal exam due to patient is combativeness and inability to cooperate during the exam. Patient refused allowed once imaging of the left kidney and bladder. Wimmer Right Kidney: Length: 10.8 cm. Mildly increased parenchymal echogenicity. No mass or hydronephrosis visualized. Left Kidney: Could not be  imaged due to patient's combativeness and noncompliance during exam. Bladder: Could not be imaged due to patient's combativeness and noncompliance during exam. IMPRESSION: Mildly increased renal parenchymal echogenicity, consistent with medical renal disease. No evidence of hydronephrosis. Left kidney and bladder could not be imaged on this exam as discussed above. Electronically Signed   By: Earle Gell M.D.   On: 10/25/2017 11:18   Dg Chest Port 1 View  Result Date: 10/15/2017 CLINICAL DATA:  Hypoxia and shortness of breath EXAM: PORTABLE CHEST 1 VIEW COMPARISON:  Chest radiograph 10/12/2017 FINDINGS: Shallow lung inflation. Unchanged cardiomegaly. There is right  parahilar consolidation with bilateral pulmonary vascular congestion. No pneumothorax or sizable pleural effusion. IMPRESSION: 1. Right parahilar consolidation may indicate developing pneumonia. Followup PA and lateral chest X-ray is recommended in 3-4 weeks following trial of antibiotic therapy to ensure resolution and exclude underlying malignancy. 2. Cardiomegaly and pulmonary vascular congestion. Electronically Signed   By: Ulyses Jarred M.D.   On: 10/15/2017 22:27    Microbiology: No results found for this or any previous visit (from the past 240 hour(s)).   Labs: Basic Metabolic Panel: Recent Labs  Lab 10/24/17 0648 10/24/17 1024 10/24/17 1637 10/25/17 1130 10/26/17 0454  NA 126* 127* 129* 126* 125*  K 6.4* 5.9* 6.0* 5.8* 5.9*  CL 93* 94* 98* 95* 95*  CO2 19* 21* 21* 19* 20*  GLUCOSE 599* 478* 251* 376* 323*  BUN 56* 50* 43* 39* 35*  CREATININE 1.86* 1.74* 1.49* 1.36* 1.32*  CALCIUM 10.2 9.7 9.9 10.1 10.0   Liver Function Tests: No results for input(s): AST, ALT, ALKPHOS, BILITOT, PROT, ALBUMIN in the last 168 hours. No results for input(s): LIPASE, AMYLASE in the last 168 hours. No results for input(s): AMMONIA in the last 168 hours. CBC: Recent Labs  Lab 10/24/17 0428 10/25/17 1130 10/26/17 0454  WBC 5.9 7.1 6.1  HGB 14.0 14.3 14.7  HCT 43.1 43.1 45.1  MCV 93.1 92.9 92.6  PLT 232 190 194   Cardiac Enzymes: No results for input(s): CKTOTAL, CKMB, CKMBINDEX, TROPONINI in the last 168 hours. BNP: BNP (last 3 results) No results for input(s): BNP in the last 8760 hours.  ProBNP (last 3 results) No results for input(s): PROBNP in the last 8760 hours.  CBG: Recent Labs  Lab 10/26/17 1628 10/26/17 1927 10/26/17 2018 10/27/17 0811 10/27/17 1143  GLUCAP 96 125* 145* 245* 251*       Signed:  Lelon Frohlich  [facility] Pager: [phone] 10/27/2017, 3:15 PM

## 2017-10-27 NOTE — Progress Notes (Signed)
Patient has O2 HFNC off. This RN re-applied HFNC with no difficulty. Heath Lark, RN from ICU attempted IV. Attempt unsuccessful. Patient refused to let RN attempt second time. Tele lead was off and patient made re-attachment difficult.

## 2017-10-27 NOTE — Plan of Care (Signed)
  Acute Rehab PT Goals(only PT should resolve) Patient Will Transfer Sit To/From Stand 10/27/2017 1405 - Progressing by Lonell Grandchild, PT Flowsheets Taken 10/27/2017 1405  Patient will transfer sit to/from stand with modified independence Pt Will Transfer Bed To Chair/Chair To Bed 10/27/2017 1405 - Progressing by Lonell Grandchild, PT Flowsheets Taken 10/27/2017 1405  Pt will Transfer Bed to Chair/Chair to Bed with modified independence Pt Will Ambulate 10/27/2017 1405 - Progressing by Lonell Grandchild, PT Flowsheets Taken 10/27/2017 1405  Pt will Ambulate 50 feet;with supervision;with cane  2:06 PM, 10/27/17 Lonell Grandchild, MPT Physical Therapist with [facility] [phone] office [phone] mobile phone

## 2017-10-27 NOTE — Progress Notes (Signed)
BP and HR have slightly improved. Patient no longer diaphoretic or lethargic. Patient did allow this RN to apply Tele. Patient was laughing when meds were given. Patient cooperative with assessment and taking meds whole. Still no IV access. Will continue to monitor.

## 2017-10-27 NOTE — Clinical Social Work Note (Signed)
LCSW notified facility of discharge and arranged for facility to transport patient. Discharge clinicals were sent via [employer].   LCSW signing off.     Rahmir Beever, Clydene Pugh, LCSW

## 2017-10-27 NOTE — Evaluation (Signed)
Physical Therapy Evaluation Patient Details Name: Karina Clarke MRN: [id number] DOB: 1961/11/03 Today's Date: 10/27/2017   History of Present Illness  Karina Clarke is a 56 y.o. female with medical history significant of HIV/AIDS, schizophrenia, presents to the emergency room with complaints of shortness of breath.  She complains of having shortness of breath for the past 2 days.  She says associated cough and wheezing.  Cough is productive of green colored sputum.  She is also been having some fevers.  Her symptoms have gotten progressively worse.  No vomiting, diarrhea, sore throat.  She does have some substernal chest pain which is worse with coughing.  On arrival to the emergency room, she was noted to be in respiratory distress.  Oxygen was applied and she received bronchodilators.  Her respiratory status has since improved.  Chest x-ray indicated pneumonia.  She is been referred for admission    Clinical Impression  Patient functioning near baseline for functional mobility/gait, mostly limited secondary to c/o dizziness and fatigue.  Patient states she ambulates short household distances with [facility] and has friends that will help her at home.  Patient will benefit from continued physical therapy in hospital and recommended venue below to increase strength, balance, endurance for safe ADLs and gait.    Follow Up Recommendations Home health PT;Supervision for mobility/OOB    Equipment Recommendations  None recommended by PT    Recommendations for Other Services       Precautions / Restrictions Precautions Precautions: Fall Restrictions Weight Bearing Restrictions: No      Mobility  Bed Mobility Overal bed mobility: Modified Independent                Transfers Overall transfer level: Needs assistance Equipment used: Straight cane Transfers: Sit to/from Stand Sit to Stand: Supervision            Ambulation/Gait Ambulation/Gait assistance: Min guard Ambulation  Distance (Feet): 18 Feet Assistive device: Straight cane Gait Pattern/deviations: Step-to pattern;Decreased step length - right;Decreased step length - left;Decreased stride length   Gait velocity interpretation: Below normal speed for age/gender General Gait Details: demonstrates slow labored cadence with mostly 3 point gait pattern, limited secondary to c/o fatigue and dizziness  Stairs            Wheelchair Mobility    Modified Rankin (Stroke Patients Only)       Balance Overall balance assessment: Needs assistance Sitting-balance support: Feet supported;No upper extremity supported Sitting balance-Leahy Scale: Good     Standing balance support: Single extremity supported;During functional activity Standing balance-Leahy Scale: Fair                               Pertinent Vitals/Pain Pain Assessment: No/denies pain    Home Living Family/patient expects to be discharged to:: Group home Living Arrangements: Non-relatives/Friends;Group Home Available Help at Discharge: Friend(s) Type of Home: House Home Access: Level entry     Home Layout: One level Home Equipment: Cane - single point      Prior Function Level of Independence: Independent with assistive device(s)               Hand Dominance        Extremity/Trunk Assessment   Upper Extremity Assessment Upper Extremity Assessment: Generalized weakness    Lower Extremity Assessment Lower Extremity Assessment: Generalized weakness    Cervical / Trunk Assessment Cervical / Trunk Assessment: Normal  Communication   Communication: No difficulties  Cognition  Arousal/Alertness: Awake/alert Behavior During Therapy: WFL for tasks assessed/performed Overall Cognitive Status: Within Functional Limits for tasks assessed                                        General Comments      Exercises     Assessment/Plan    PT Assessment Patient needs continued PT services   PT Problem List Decreased strength;Decreased activity tolerance;Decreased balance;Decreased mobility       PT Treatment Interventions Gait training;Functional mobility training;Therapeutic activities;Therapeutic exercise;Patient/family education    PT Goals (Current goals can be found in the Care Plan section)  Acute Rehab PT Goals Patient Stated Goal: return home PT Goal Formulation: With patient Time For Goal Achievement: 10/31/17 Potential to Achieve Goals: Good    Frequency Min 3X/week   Barriers to discharge        Co-evaluation               AM-PAC PT "6 Clicks" Daily Activity  Outcome Measure Difficulty turning over in bed (including adjusting bedclothes, sheets and blankets)?: None Difficulty moving from lying on back to sitting on the side of the bed? : None Difficulty sitting down on and standing up from a chair with arms (e.g., wheelchair, bedside commode, etc,.)?: A Little Help needed moving to and from a bed to chair (including a wheelchair)?: A Little Help needed walking in hospital room?: A Little Help needed climbing 3-5 steps with a railing? : A Lot 6 Click Score: 19    End of Session Equipment Utilized During Treatment: Gait belt Activity Tolerance: Patient limited by fatigue(limited by dizziness) Patient left: in bed;with call bell/phone within reach Nurse Communication: Mobility status PT Visit Diagnosis: Unsteadiness on feet (R26.81);Other abnormalities of gait and mobility (R26.89);Muscle weakness (generalized) (M62.81)    Time: 5465-6812 PT Time Calculation (min) (ACUTE ONLY): 26 min   Charges:   PT Evaluation $PT Eval Moderate Complexity: 1 Mod PT Treatments $Therapeutic Activity: 23-37 mins   PT G Codes:        2:04 PM, 11-08-17 Karina Clarke Grandchild, MPT Physical Therapist with [facility] [phone] office [phone] mobile phone

## 2017-10-27 NOTE — Progress Notes (Signed)
Subjective: Interval History: Patient offers no complaint. Denies any difficulty in breathing  Objective: Vital signs in last 24 hours: Temp:  [97.5 F (36.4 C)-98.8 F (37.1 C)] 98.6 F (37 C) (12/03 0442) Pulse Rate:  [68-130] 113 (12/03 0442) Resp:  [18-20] 18 (12/03 0442) BP: (86-111)/(59-97) 100/61 (12/03 0442) SpO2:  [82 %-100 %] 97 % (12/03 0442) Weight change:   Intake/Output from previous day: 12/02 0701 - 12/03 0700 In: 7702.9 [P.O.:1080; I.V.:6622.9] Out: -  Intake/Output this shift: No intake/output data recorded.  General appearance: alert, cooperative and no distress Resp: clear to auscultation bilaterally Cardio: regular rate and rhythm Extremities: No edema  Lab Results: Recent Labs    10/25/17 1130 10/26/17 0454  WBC 7.1 6.1  HGB 14.3 14.7  HCT 43.1 45.1  PLT 190 194   BMET:  Recent Labs    10/25/17 1130 10/26/17 0454  NA 126* 125*  K 5.8* 5.9*  CL 95* 95*  CO2 19* 20*  GLUCOSE 376* 323*  BUN 39* 35*  CREATININE 1.36* 1.32*  CALCIUM 10.1 10.0   No results for input(s): PTH in the last 72 hours. Iron Studies: No results for input(s): IRON, TIBC, TRANSFERRIN, FERRITIN in the last 72 hours.  Studies/Results: US Renal  Result Date: 10/25/2017 CLINICAL DATA:  Acute tubular necrosis. Acute renal failure. Diabetes. Sepsis. HIV. EXAM: RENAL / URINARY TRACT ULTRASOUND COMPLETE COMPARISON:  None. FINDINGS: Suboptimal exam due to patient is combativeness and inability to cooperate during the exam. Patient refused allowed once imaging of the left kidney and bladder. Wimmer Right Kidney: Length: 10.8 cm. Mildly increased parenchymal echogenicity. No mass or hydronephrosis visualized. Left Kidney: Could not be imaged due to patient's combativeness and noncompliance during exam. Bladder: Could not be imaged due to patient's combativeness and noncompliance during exam. IMPRESSION: Mildly increased renal parenchymal echogenicity, consistent with medical renal  disease. No evidence of hydronephrosis. Left kidney and bladder could not be imaged on this exam as discussed above. Electronically Signed   By: Earle Gell M.D.   On: 10/25/2017 11:18    I have reviewed the patient's current medications.  Assessment/Plan: Problem #1 hyperkalemia: Possibly secondary to high potassium intake/worsening of renal failure/type IV RTA/Bactrim . She was given to doses of Kayexalate yesterday and had about 3 bowl movements after that according to the nursing staff. Patient how ever refused blood work. Initially she agreed when I explained to her the importance of checking her blood work but refused when they were trying to draw blood. Hence not sure what is her potassium is for today 2] hyponatremia: Possibly hypovolemic hyponatremia her sodium is 125 slightly low from yesterday. She is on free water restriction 3] renal failure possibly acute.  Renal function is slightly better.  None oliguric. 4] history of schizophrenia 5] history of AIDS 6] history of H CAP. 7] diabetes: Her blood sugar is high Plan:1] continue with  free water restriction and low potassium diet 2]Renal panel in am if patient is agreable    LOS: 15 days   Ola Raap S 10/27/2017,8:23 AM

## 2017-10-27 NOTE — NC FL2 (Signed)
[place] LEVEL OF CARE SCREENING TOOL     IDENTIFICATION  Patient Name: Karina Clarke Birthdate: 1961/04/30 Sex: female Admission Date (Current Location): 10/12/2017  [facility] and [place] Number:  [employer] and Address:  [facility] [address] [place]      Provider Number: [id number]  Attending Physician Name and Address:  Isaac Bliss, Olam Idler*  Relative Name and Phone Number:       Current Level of Care: Other (Comment)([facility] ) Recommended Level of Care: Family Care Home Prior Approval Number:    Date Approved/Denied:   PASRR Number:    Discharge Plan: Other (Comment)([facility])    Current Diagnoses: Patient Active Problem List   Diagnosis Date Noted  . Diabetes mellitus, new onset ([place]) 10/24/2017  . Hyperkalemia 10/24/2017  . ARF (acute renal failure) ([place]) 10/24/2017  . COPD exacerbation ([place]) 10/21/2017  . HCAP (healthcare-associated pneumonia) 10/12/2017  . Acute respiratory failure with hypoxia ([place]) 10/12/2017  . AIDS ([place]) 10/12/2017  . Influenza 12/26/2016  . Sepsis ([place]) 12/26/2016  . CAP (community acquired pneumonia) 12/26/2016  . Tobacco use disorder 12/26/2016  . HIV (human immunodeficiency virus infection) ([place]) 12/26/2016  . Schizophrenia ([place]) 12/26/2016  . Personality disorder ([place]) 08/22/2016  . Encounter for screening colonoscopy 05/24/2015    Orientation RESPIRATION BLADDER Height & Weight     Self  O2(PRN) Continent Weight: 238 lb 12.1 oz (108.3 kg) Height:  5\' 11"  (180.3 cm)  BEHAVIORAL SYMPTOMS/MOOD NEUROLOGICAL BOWEL NUTRITION STATUS      Continent Diet(Diet: Heart Healthy)  AMBULATORY STATUS COMMUNICATION OF NEEDS Skin   Extensive Assist Verbally Normal                       Personal Care Assistance Level of Assistance  Bathing, Feeding, Dressing Bathing Assistance: Limited assistance Feeding assistance: Independent Dressing  Assistance: Limited assistance     Functional Limitations Info  Sight, Hearing, Speech Sight Info: Adequate Hearing Info: Adequate Speech Info: Adequate    SPECIAL CARE FACTORS FREQUENCY  PT (By licensed PT)     PT Frequency: 3x/week              Contractures Contractures Info: Not present    Additional Factors Info  Psychotropic, Code Status Code Status Info: Full Code   Psychotropic Info: Haldol, Depakote, Seroquel, Haldol Decanoate         Current Medications (10/27/2017):  This is the current hospital active medication list Current Facility-Administered Medications  Medication Dose Route Frequency Provider Last Rate Last Dose  . 0.9 %  sodium chloride infusion   Intravenous Continuous Opyd, Ilene Qua, MD   Stopped at 10/26/17 1900  . abacavir (ZIAGEN) tablet 600 mg  600 mg Oral Daily Isaac Bliss, Rayford Halsted, MD   600 mg at 10/27/17 2725   And  . lamiVUDine (EPIVIR) tablet 300 mg  300 mg Oral Daily Isaac Bliss, Rayford Halsted, MD   300 mg at 10/27/17 [phone]  . acetaminophen (TYLENOL) tablet 650 mg  650 mg Oral Q6H PRN Kathie Dike, MD   650 mg at 10/25/17 2223   Or  . acetaminophen (TYLENOL) suppository 650 mg  650 mg Rectal Q6H PRN Kathie Dike, MD      . amoxicillin-clavulanate (AUGMENTIN) 875-125 MG per tablet 1 tablet  1 tablet Oral Q12H Johnson, Clanford L, MD   1 tablet at 10/27/17 0842  . atazanavir (REYATAZ) capsule 400 mg  400  mg Oral Q breakfast Kathie Dike, MD   400 mg at 10/27/17 0830  . atorvastatin (LIPITOR) tablet 10 mg  10 mg Oral BID Kathie Dike, MD   10 mg at 10/27/17 0842  . calcium gluconate inj 10% (1 g) URGENT USE ONLY!  1 g Intravenous Once Opyd, Ilene Qua, MD      . clindamycin (CLEOCIN) capsule 600 mg  600 mg Oral Q8H Isaac Bliss, Rayford Halsted, MD   600 mg at 10/27/17 1516  . dextrose 5 %-0.45 % sodium chloride infusion   Intravenous Continuous Opyd, Ilene Qua, MD      . divalproex (DEPAKOTE) DR tablet 500 mg  500 mg Oral 2  times per day Kathie Dike, MD   500 mg at 10/27/17 1516  . enoxaparin (LOVENOX) injection 40 mg  40 mg Subcutaneous Q24H Kathie Dike, MD   40 mg at 10/26/17 1709  . guaiFENesin (MUCINEX) 12 hr tablet 1,200 mg  1,200 mg Oral BID Kathie Dike, MD   1,200 mg at 10/27/17 0842  . haloperidol (HALDOL) tablet 20 mg  20 mg Oral TID Isaac Bliss, Rayford Halsted, MD   20 mg at 10/27/17 1516  . hydrOXYzine (ATARAX/VISTARIL) tablet 25 mg  25 mg Oral QHS Isaac Bliss, Rayford Halsted, MD   25 mg at 10/26/17 2338  . ibuprofen (ADVIL,MOTRIN) tablet 400 mg  400 mg Oral Q8H PRN Isaac Bliss, Rayford Halsted, MD   400 mg at 10/26/17 0430  . insulin aspart (novoLOG) injection 0-20 Units  0-20 Units Subcutaneous TID WC Isaac Bliss, Rayford Halsted, MD   11 Units at 10/27/17 1253  . insulin aspart (novoLOG) injection 0-5 Units  0-5 Units Subcutaneous QHS Isaac Bliss, Rayford Halsted, MD   3 Units at 10/25/17 2204  . insulin aspart (novoLOG) injection 6 Units  6 Units Subcutaneous TID WC Isaac Bliss, Rayford Halsted, MD   6 Units at 10/27/17 1254  . insulin detemir (LEVEMIR) injection 33 Units  33 Units Subcutaneous QHS Isaac Bliss, Rayford Halsted, MD      . ipratropium-albuterol (DUONEB) 0.5-2.5 (3) MG/3ML nebulizer solution 3 mL  3 mL Nebulization Q4H PRN Isaac Bliss, Rayford Halsted, MD      . magnesium oxide (MAG-OX) tablet 400 mg  400 mg Oral Daily Kathie Dike, MD   400 mg at 10/27/17 0842  . MEDLINE mouth rinse  15 mL Mouth Rinse BID Isaac Bliss, Rayford Halsted, MD   15 mL at 10/27/17 0845  . ondansetron (ZOFRAN) tablet 4 mg  4 mg Oral Q6H PRN Kathie Dike, MD       Or  . ondansetron (ZOFRAN) injection 4 mg  4 mg Intravenous Q6H PRN Kathie Dike, MD   4 mg at 10/14/17 0631  . polyethylene glycol (MIRALAX / GLYCOLAX) packet 17 g  17 g Oral Daily PRN Kathie Dike, MD      . predniSONE (DELTASONE) tablet 5 mg  5 mg Oral Q breakfast Isaac Bliss, Rayford Halsted, MD   5 mg at 10/27/17 0830  . primaquine tablet 30  mg  30 mg Oral Daily Isaac Bliss, Rayford Halsted, MD   30 mg at 10/27/17 [phone]  . QUEtiapine (SEROQUEL) tablet 100 mg  100 mg Oral 2 times per day Kathie Dike, MD   100 mg at 10/26/17 2338  . raltegravir (ISENTRESS) tablet 400 mg  400 mg Oral BID Kathie Dike, MD   400 mg at 10/27/17 0842  . sodium chloride 0.9 % bolus 500 mL  500 mL Intravenous  Once Lovey Newcomer T, NP      . trihexyphenidyl (ARTANE) tablet 2 mg  2 mg Oral BID WC Kathie Dike, MD   2 mg at 10/27/17 0831  . valACYclovir (VALTREX) tablet 1,000 mg  1,000 mg Oral Daily Kathie Dike, MD   1,000 mg at 10/27/17 7209  . zolpidem (AMBIEN) tablet 5 mg  5 mg Oral QHS Kathie Dike, MD   5 mg at 10/26/17 2338     Discharge Medications: Medication List     TAKE these medications   abacavir-lamiVUDine 600-300 MG tablet Commonly known as:  EPZICOM Take 1 tablet by mouth daily.   albuterol 108 (90 Base) MCG/ACT inhaler Commonly known as:  PROVENTIL HFA;VENTOLIN HFA Inhale 1-2 puffs into the lungs 2 (two) times daily as needed for wheezing or shortness of breath.   amoxicillin-clavulanate 875-125 MG tablet Commonly known as:  AUGMENTIN Take 1 tablet by mouth every 12 (twelve) hours for 5 days.   atazanavir 200 MG capsule Commonly known as:  REYATAZ Take 400 mg by mouth daily.   atorvastatin 10 MG tablet Commonly known as:  LIPITOR Take 10 mg by mouth 2 (two) times daily.   clindamycin 300 MG capsule Commonly known as:  CLEOCIN Take 2 capsules (600 mg total) by mouth every 8 (eight) hours for 10 days.   divalproex 500 MG DR tablet Commonly known as:  DEPAKOTE Take 500 mg by mouth See admin instructions. Take 1 tablet (500 mg) by mouth at 4pm and 8pm daily   haloperidol 10 MG tablet Commonly known as:  HALDOL Take 20 mg by mouth 3 (three) times daily.   haloperidol decanoate 100 MG/ML injection Commonly known as:  HALDOL DECANOATE Inject 200 mg into the muscle every 28 (twenty-eight) days. Last  injection 06/09/15   hydrOXYzine 25 MG capsule Commonly known as:  VISTARIL Take 25 mg by mouth at bedtime.   insulin detemir 100 UNIT/ML injection Commonly known as:  LEVEMIR Inject 0.33 mLs (33 Units total) into the skin at bedtime.   Magnesium Oxide 400 MG Caps Take 1 capsule (400 mg total) by mouth every morning.   polyethylene glycol packet Commonly known as:  MIRALAX / GLYCOLAX Take 17 g by mouth daily as needed.   predniSONE 5 MG tablet Commonly known as:  DELTASONE Take 1 tablet (5 mg total) by mouth daily with breakfast for 10 days. Start taking on:  10/28/2017   primaquine 26.3 MG tablet Take 2 tablets (30 mg total) by mouth daily for 10 days. Start taking on:  10/28/2017   QUEtiapine 200 MG tablet Commonly known as:  SEROQUEL Take 100 mg by mouth See admin instructions. Take 1 tablet (200 mg) by mouth with supper and at bedtime daily   raltegravir 400 MG tablet Commonly known as:  ISENTRESS Take 400 mg by mouth 2 (two) times daily.   trihexyphenidyl 2 MG tablet Commonly known as:  ARTANE Take 2 mg by mouth 2 (two) times daily with a meal.   valACYclovir 1000 MG tablet Commonly known as:  VALTREX Take 1,000 mg by mouth daily.   zolpidem 10 MG tablet Commonly known as:  AMBIEN Take 5 mg by mouth at bedtime.         Relevant Imaging Results:  Relevant Lab Results:   Additional Information SSN [id number]  Ihor Gully, LCSW

## 2017-10-27 NOTE — Progress Notes (Signed)
Patient refused AM labs. Olevia Bowens, MD notified.

## 2017-10-30 ENCOUNTER — Emergency Department (HOSPITAL_COMMUNITY)
Admission: EM | Admit: 2017-10-30 | Discharge: 2017-10-30 | Disposition: A | Discharge status: Nursing Home (Includes ICF) | Payer: Medicaid Other | Attending: Emergency Medicine | Admitting: Emergency Medicine

## 2017-10-30 ENCOUNTER — Encounter (HOSPITAL_COMMUNITY): Payer: Self-pay | Admitting: Emergency Medicine

## 2017-10-30 DIAGNOSIS — F200 Paranoid schizophrenia: Secondary | ICD-10-CM | POA: Diagnosis not present

## 2017-10-30 DIAGNOSIS — F70 Mild intellectual disabilities: Secondary | ICD-10-CM | POA: Insufficient documentation

## 2017-10-30 DIAGNOSIS — J449 Chronic obstructive pulmonary disease, unspecified: Secondary | ICD-10-CM | POA: Insufficient documentation

## 2017-10-30 DIAGNOSIS — F17210 Nicotine dependence, cigarettes, uncomplicated: Secondary | ICD-10-CM | POA: Diagnosis not present

## 2017-10-30 DIAGNOSIS — M79604 Pain in right leg: Secondary | ICD-10-CM | POA: Insufficient documentation

## 2017-10-30 DIAGNOSIS — I10 Essential (primary) hypertension: Secondary | ICD-10-CM | POA: Insufficient documentation

## 2017-10-30 DIAGNOSIS — E119 Type 2 diabetes mellitus without complications: Secondary | ICD-10-CM | POA: Insufficient documentation

## 2017-10-30 DIAGNOSIS — Z79899 Other long term (current) drug therapy: Secondary | ICD-10-CM | POA: Diagnosis not present

## 2017-10-30 DIAGNOSIS — B20 Human immunodeficiency virus [HIV] disease: Secondary | ICD-10-CM | POA: Diagnosis not present

## 2017-10-30 LAB — CBG MONITORING, ED: Glucose-Capillary: 123 mg/dL — ABNORMAL HIGH (ref 65–99)

## 2017-10-30 MED ORDER — HYDROCODONE-ACETAMINOPHEN 5-325 MG PO TABS: 1.0000 | ORAL_TABLET | Freq: Four times a day (QID) | ORAL | 0 refills | Status: DC | PRN

## 2017-10-30 MED ORDER — OXYCODONE-ACETAMINOPHEN 5-325 MG PO TABS
1.0000 | ORAL_TABLET | Freq: Once | ORAL | Status: AC
  Administered 2017-10-30: 1 via ORAL
  Filled 2017-10-30: qty 1

## 2017-10-30 NOTE — Discharge Instructions (Signed)
Follow-up with your family doctor next week for recheck. 

## 2017-10-30 NOTE — ED Provider Notes (Signed)
[facility] EMERGENCY DEPARTMENT Provider Note   CSN: [id number] Arrival date & time: 10/30/17  0998     History   Chief Complaint Chief Complaint  Patient presents with  . Leg Pain    HPI Karina Clarke is a 56 y.o. female.  Patient with a history of schizophrenia and diabetes.  She presents with exacerbation of chronic leg pain.  No fever no chills no history of injury   The history is provided by the patient.  Leg Pain   This is a recurrent problem. The problem occurs constantly. The problem has not changed since onset.Pain location: Bilateral complete leg discomfort. The quality of the pain is described as aching. The pain is at a severity of 3/10. The pain is moderate. Pertinent negatives include full range of motion.    Past Medical History:  Diagnosis Date  . 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 Bloating    per sister  . Chronic right hip pain    sister states circulation problem in hip  . Family history of adverse reaction to anesthesia    sister has hx. of post-op N/V  . High cholesterol   . HIV (human immunodeficiency virus infection) ([place])   . Hypertension   . Limp   . Mild intellectual disability   . No natural teeth    does not wear her dentures  . Paranoid schizophrenia ([place])   . Personality disorder ([place])   . Schizophrenia [facility])     Patient Active Problem List   Diagnosis Date Noted  . Diabetes mellitus, new onset ([place]) 10/24/2017  . Hyperkalemia 10/24/2017  . ARF (acute renal failure) ([place]) 10/24/2017  . COPD exacerbation ([place]) 10/21/2017  . HCAP (healthcare-associated pneumonia) 10/12/2017  . Acute respiratory failure with hypoxia ([place]) 10/12/2017  . AIDS ([place]) 10/12/2017  . Influenza 12/26/2016  . Sepsis ([place]) 12/26/2016  . CAP (community acquired pneumonia) 12/26/2016  . Tobacco use disorder 12/26/2016  . HIV (human immunodeficiency virus infection) ([place]) 12/26/2016  . Schizophrenia ([place]) 12/26/2016  . Personality  disorder ([place]) 08/22/2016  . Encounter for screening colonoscopy 05/24/2015    Past Surgical History:  Procedure Laterality Date  . BREAST LUMPECTOMY WITH NEEDLE LOCALIZATION Left 07/28/2015   Procedure: BREAST LUMPECTOMY WITH NEEDLE LOCALIZATION;  Surgeon: Autumn Messing III, MD;  Location: [place];  Service: General;  Laterality: Left;  . COLONOSCOPY WITH PROPOFOL N/A 06/13/2015   SLF: 1. the left colon is redundant 2. the examination was otherwise normal 3. small internal hemorrhoids 4. moderate sized external hemorrhoids  . HEMORRHOID SURGERY    . UMBILICAL HERNIA REPAIR      OB History    No data available       Home Medications    Prior to Admission medications   Medication Sig Start Date End Date Taking? Authorizing Provider  abacavir-lamiVUDine (EPZICOM) 600-300 MG per tablet Take 1 tablet by mouth daily.   Yes [provider]  albuterol (PROVENTIL HFA;VENTOLIN HFA) 108 (90 Base) MCG/ACT inhaler Inhale 1-2 puffs into the lungs 2 (two) times daily as needed for wheezing or shortness of breath.   Yes [provider]  amoxicillin-clavulanate (AUGMENTIN) 875-125 MG tablet Take 1 tablet by mouth every 12 (twelve) hours for 5 days. 10/27/17 11/01/17 Yes Erline Hau, MD  atazanavir (REYATAZ) 200 MG capsule Take 400 mg by mouth daily.    Yes [provider]  atorvastatin (LIPITOR) 10 MG tablet Take 10 mg by mouth 2 (two) times  daily.   Yes [provider]  clindamycin (CLEOCIN) 300 MG capsule Take 2 capsules (600 mg total) by mouth every 8 (eight) hours for 10 days. 10/27/17 11/06/17 Yes Erline Hau, MD  divalproex (DEPAKOTE) 500 MG DR tablet Take 500 mg by mouth See admin instructions. Take 1 tablet (500 mg) by mouth at 4pm and 8pm daily   Yes [provider]  haloperidol (HALDOL) 10 MG tablet Take 20 mg by mouth 3 (three) times daily.    Yes [provider]  haloperidol decanoate (HALDOL  DECANOATE) 100 MG/ML injection Inject 200 mg into the muscle every 28 (twenty-eight) days. Last injection 06/09/15    Yes [provider]  hydrOXYzine (VISTARIL) 25 MG capsule Take 25 mg by mouth at bedtime.    Yes [provider]  insulin detemir (LEVEMIR) 100 UNIT/ML injection Inject 0.33 mLs (33 Units total) into the skin at bedtime. 10/27/17  Yes Isaac Bliss, Rayford Halsted, MD  Magnesium Oxide 400 MG CAPS Take 1 capsule (400 mg total) by mouth every morning. 08/10/17  Yes Reyne Dumas, MD  polyethylene glycol (MIRALAX / GLYCOLAX) packet Take 17 g by mouth daily as needed.   Yes [provider]  predniSONE (DELTASONE) 5 MG tablet Take 1 tablet (5 mg total) by mouth daily with breakfast for 10 days. 10/28/17 11/07/17 Yes Erline Hau, MD  primaquine 26.3 MG tablet Take 2 tablets (30 mg total) by mouth daily for 10 days. 10/28/17 11/07/17 Yes Erline Hau, MD  QUEtiapine (SEROQUEL) 200 MG tablet Take 100 mg by mouth See admin instructions. Take 1 tablet (200 mg) by mouth with supper and at bedtime daily   Yes [provider]  raltegravir (ISENTRESS) 400 MG tablet Take 400 mg by mouth 2 (two) times daily.   Yes [provider]  trihexyphenidyl (ARTANE) 2 MG tablet Take 2 mg by mouth 2 (two) times daily with a meal.   Yes [provider]  valACYclovir (VALTREX) 1000 MG tablet Take 1,000 mg by mouth daily.    Yes [provider]  zolpidem (AMBIEN) 10 MG tablet Take 5 mg by mouth at bedtime.    Yes [provider]  HYDROcodone-acetaminophen (NORCO/VICODIN) 5-325 MG tablet Take 1 tablet by mouth every 6 (six) hours as needed for moderate pain. 10/30/17   Milton Ferguson, MD    Family History Family History  Problem Relation Age of Onset  . Anesthesia problems Sister        post-op N/V    Social History Social History   Tobacco Use  . Smoking status: Current Every Day Smoker    Packs/day: 1.00     Years: 18.00    Pack years: 18.00    Types: Cigarettes  . Smokeless tobacco: Never Used  Substance Use Topics  . Alcohol use: No    Alcohol/week: 0.0 oz  . Drug use: No     Allergies   Asa [aspirin]   Review of Systems Review of Systems  Constitutional: Negative for appetite change and fatigue.  HENT: Negative for congestion, ear discharge and sinus pressure.   Eyes: Negative for discharge.  Respiratory: Negative for cough.   Cardiovascular: Negative for chest pain.  Gastrointestinal: Negative for abdominal pain and diarrhea.  Genitourinary: Negative for frequency and hematuria.  Musculoskeletal: Negative for back pain.       Bilateral leg pain  Skin: Negative for rash.  Neurological: Negative for seizures and headaches.  Psychiatric/Behavioral: Negative for hallucinations.  Physical Exam Updated Vital Signs BP 129/87   Pulse 87   Temp 98.6 F (37 C) (Oral)   Resp 16   Ht 5\' 11"  (1.803 m)   Wt 97.5 kg (215 lb)   SpO2 91%   BMI 29.99 kg/m   Physical Exam  Constitutional: She is oriented to person, place, and time. She appears well-developed.  HENT:  Head: Normocephalic.  Eyes: Conjunctivae and EOM are normal. No scleral icterus.  Neck: Neck supple. No thyromegaly present.  Cardiovascular: Normal rate and regular rhythm. Exam reveals no gallop and no friction rub.  No murmur heard. Pulmonary/Chest: No stridor. She has no wheezes. She has no rales. She exhibits no tenderness.  Abdominal: She exhibits no distension. There is no tenderness. There is no rebound.  Musculoskeletal: Normal range of motion. She exhibits no edema.  Patient with good femoral pulses good dorsalis pedis pulses exam of both lower extremities is completely normal  Lymphadenopathy:    She has no cervical adenopathy.  Neurological: She is oriented to person, place, and time. She exhibits normal muscle tone. Coordination normal.  Skin: No rash noted. No erythema.  Psychiatric: She has a  normal mood and affect. Her behavior is normal.     ED Treatments / Results  Labs (all labs ordered are listed, but only abnormal results are displayed) Labs Reviewed  CBG MONITORING, ED - Abnormal; Notable for the following components:      Result Value   Glucose-Capillary 123 (*)    All other components within normal limits    EKG  EKG Interpretation None       Radiology No results found.  Procedures Procedures (including critical care time)  Medications Ordered in ED Medications  oxyCODONE-acetaminophen (PERCOCET/ROXICET) 5-325 MG per tablet 1 tablet (1 tablet Oral Given 10/30/17 0946)     Initial Impression / Assessment and Plan / ED Course  I have reviewed the triage vital signs and the nursing notes.  Pertinent labs & imaging results that were available during my care of the patient were reviewed by me and considered in my medical decision making (see chart for details).     Patient's blood sugar is minimally elevated and she will follow-up with her doctor if not improving next week.  Patient given some pain medicine for the chronic leg pain  Final Clinical Impressions(s) / ED Diagnoses   Final diagnoses:  Right leg pain    ED Discharge Orders        Ordered    HYDROcodone-acetaminophen (NORCO/VICODIN) 5-325 MG tablet  Every 6 hours PRN     10/30/17 1305       Milton Ferguson, MD 10/30/17 1312

## 2017-10-30 NOTE — ED Notes (Addendum)
Lab reports 2 unsuccessful attempts with obtaining blood. Gave order to dc lab work and obtain CBG

## 2017-10-30 NOTE — ED Notes (Signed)
Notified [facility] pt was ready for discharge. They will be here approx 30 mins

## 2017-10-30 NOTE — ED Triage Notes (Signed)
Pt here for pain in both legs.  States it feels like bugs crawling in them.  Started 2 days ago, no swelling, pedal pulses present.

## 2017-11-14 ENCOUNTER — Other Ambulatory Visit: Payer: Self-pay

## 2017-11-14 ENCOUNTER — Encounter (HOSPITAL_COMMUNITY): Payer: Self-pay | Admitting: Emergency Medicine

## 2017-11-14 ENCOUNTER — Emergency Department (HOSPITAL_COMMUNITY)
Admission: EM | Admit: 2017-11-14 | Discharge: 2017-11-14 | Disposition: A | Discharge status: Home / Self Care | Payer: Medicaid Other | Attending: Emergency Medicine | Admitting: Emergency Medicine

## 2017-11-14 ENCOUNTER — Emergency Department (HOSPITAL_COMMUNITY): Payer: Medicaid Other

## 2017-11-14 DIAGNOSIS — Z79899 Other long term (current) drug therapy: Secondary | ICD-10-CM | POA: Diagnosis not present

## 2017-11-14 DIAGNOSIS — F17210 Nicotine dependence, cigarettes, uncomplicated: Secondary | ICD-10-CM | POA: Insufficient documentation

## 2017-11-14 DIAGNOSIS — E119 Type 2 diabetes mellitus without complications: Secondary | ICD-10-CM | POA: Diagnosis not present

## 2017-11-14 DIAGNOSIS — Z794 Long term (current) use of insulin: Secondary | ICD-10-CM | POA: Diagnosis not present

## 2017-11-14 DIAGNOSIS — R0602 Shortness of breath: Secondary | ICD-10-CM | POA: Diagnosis present

## 2017-11-14 DIAGNOSIS — J441 Chronic obstructive pulmonary disease with (acute) exacerbation: Secondary | ICD-10-CM | POA: Diagnosis not present

## 2017-11-14 MED ORDER — DEXAMETHASONE SODIUM PHOSPHATE 10 MG/ML IJ SOLN
10.0000 mg | Freq: Once | INTRAMUSCULAR | Status: AC
  Administered 2017-11-14: 10 mg via INTRAMUSCULAR
  Filled 2017-11-14: qty 1

## 2017-11-14 MED ORDER — PREDNISONE 20 MG PO TABS: 40.0000 mg | ORAL_TABLET | Freq: Every day | ORAL | 0 refills | Status: DC

## 2017-11-14 MED ORDER — ALBUTEROL SULFATE HFA 108 (90 BASE) MCG/ACT IN AERS
1.0000 | INHALATION_SPRAY | Freq: Four times a day (QID) | RESPIRATORY_TRACT | Status: DC | PRN
  Administered 2017-11-14: 2 via RESPIRATORY_TRACT
  Filled 2017-11-14: qty 6.7

## 2017-11-14 MED ORDER — ALBUTEROL SULFATE (2.5 MG/3ML) 0.083% IN NEBU
5.0000 mg | INHALATION_SOLUTION | Freq: Once | RESPIRATORY_TRACT | Status: AC
  Administered 2017-11-14: 5 mg via RESPIRATORY_TRACT
  Filled 2017-11-14: qty 6

## 2017-11-14 NOTE — ED Provider Notes (Signed)
[facility] EMERGENCY DEPARTMENT Provider Note   CSN: [id number] Arrival date & time: 11/14/17  1238     History   Chief Complaint Chief Complaint  Patient presents with  . Shortness of Breath    HPI Karina Clarke is a 56 y.o. female.  HPI  Patient presents with a family member who provides much of the HPI. Patient has schizophrenia, lives in a group home, level 5 caveat secondary to psychiatric/cognitive condition. Patient's family member notes that she has a long history of episodic dyspnea, and has seemingly been worse over the past few days, with new cough. She denies recent fever, substantial changes from normal interactivity.   Past Medical History:  Diagnosis Date  . 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 Bloating    per sister  . Chronic right hip pain    sister states circulation problem in hip  . Family history of adverse reaction to anesthesia    sister has hx. of post-op N/V  . High cholesterol   . HIV (human immunodeficiency virus infection) ([place])   . Hypertension   . Limp   . Mild intellectual disability   . No natural teeth    does not wear her dentures  . Paranoid schizophrenia ([place])   . Personality disorder ([place])   . Schizophrenia [facility])     Patient Active Problem List   Diagnosis Date Noted  . Diabetes mellitus, new onset ([place]) 10/24/2017  . Hyperkalemia 10/24/2017  . ARF (acute renal failure) ([place]) 10/24/2017  . COPD exacerbation ([place]) 10/21/2017  . HCAP (healthcare-associated pneumonia) 10/12/2017  . Acute respiratory failure with hypoxia ([place]) 10/12/2017  . AIDS ([place]) 10/12/2017  . Influenza 12/26/2016  . Sepsis ([place]) 12/26/2016  . CAP (community acquired pneumonia) 12/26/2016  . Tobacco use disorder 12/26/2016  . HIV (human immunodeficiency virus infection) ([place]) 12/26/2016  . Schizophrenia ([place]) 12/26/2016  . Personality disorder ([place]) 08/22/2016  . Encounter for screening colonoscopy 05/24/2015    Past Surgical  History:  Procedure Laterality Date  . BREAST LUMPECTOMY WITH NEEDLE LOCALIZATION Left 07/28/2015   Procedure: BREAST LUMPECTOMY WITH NEEDLE LOCALIZATION;  Surgeon: Autumn Messing III, MD;  Location: [place];  Service: General;  Laterality: Left;  . COLONOSCOPY WITH PROPOFOL N/A 06/13/2015   SLF: 1. the left colon is redundant 2. the examination was otherwise normal 3. small internal hemorrhoids 4. moderate sized external hemorrhoids  . HEMORRHOID SURGERY    . UMBILICAL HERNIA REPAIR      OB History    No data available       Home Medications    Prior to Admission medications   Medication Sig Start Date End Date Taking? Authorizing Provider  abacavir-lamiVUDine (EPZICOM) 600-300 MG per tablet Take 1 tablet by mouth daily.   Yes [provider]  albuterol (PROVENTIL HFA;VENTOLIN HFA) 108 (90 Base) MCG/ACT inhaler Inhale 1-2 puffs into the lungs 2 (two) times daily as needed for wheezing or shortness of breath.   Yes [provider]  atazanavir (REYATAZ) 200 MG capsule Take 400 mg by mouth daily.    Yes [provider]  atorvastatin (LIPITOR) 10 MG tablet Take 10 mg by mouth 2 (two) times daily.   Yes [provider]  divalproex (DEPAKOTE) 500 MG DR tablet Take 500 mg by mouth See admin instructions. Take 1 tablet (500 mg) by mouth at 4pm and 8pm daily   Yes [provider]  haloperidol (HALDOL) 10 MG tablet Take 20 mg by mouth  3 (three) times daily.    Yes [provider]  haloperidol decanoate (HALDOL DECANOATE) 100 MG/ML injection Inject 200 mg into the muscle every 28 (twenty-eight) days. Last injection 06/09/15    Yes [provider]  HYDROcodone-acetaminophen (NORCO/VICODIN) 5-325 MG tablet Take 1 tablet by mouth every 6 (six) hours as needed for moderate pain. 10/30/17  Yes Milton Ferguson, MD  hydrOXYzine (VISTARIL) 25 MG capsule Take 25 mg by mouth at bedtime.    Yes [provider]  insulin detemir  (LEVEMIR) 100 UNIT/ML injection Inject 0.33 mLs (33 Units total) into the skin at bedtime. 10/27/17  Yes Isaac Bliss, Rayford Halsted, MD  Magnesium Oxide 400 MG CAPS Take 1 capsule (400 mg total) by mouth every morning. 08/10/17  Yes Reyne Dumas, MD  polyethylene glycol (MIRALAX / GLYCOLAX) packet Take 17 g by mouth daily as needed.   Yes [provider]  QUEtiapine (SEROQUEL) 200 MG tablet Take 100 mg by mouth See admin instructions. Take 1 tablet (200 mg) by mouth with supper and at bedtime daily   Yes [provider]  raltegravir (ISENTRESS) 400 MG tablet Take 400 mg by mouth 2 (two) times daily.   Yes [provider]  trihexyphenidyl (ARTANE) 2 MG tablet Take 2 mg by mouth 2 (two) times daily with a meal.   Yes [provider]  valACYclovir (VALTREX) 1000 MG tablet Take 1,000 mg by mouth daily.    Yes [provider]  zolpidem (AMBIEN) 10 MG tablet Take 5 mg by mouth at bedtime.    Yes [provider]    Family History Family History  Problem Relation Age of Onset  . Anesthesia problems Sister        post-op N/V    Social History Social History   Tobacco Use  . Smoking status: Current Every Day Smoker    Packs/day: 1.00    Years: 18.00    Pack years: 18.00    Types: Cigarettes  . Smokeless tobacco: Never Used  Substance Use Topics  . Alcohol use: No    Alcohol/week: 0.0 oz  . Drug use: No     Allergies   Asa [aspirin]   Review of Systems Review of Systems  Unable to perform ROS: Psychiatric disorder     Physical Exam Updated Vital Signs BP 113/86   Pulse 97   Temp 98.1 F (36.7 C)   Resp 20   Ht 5\' 11"  (1.803 m)   Wt 97.5 kg (215 lb)   SpO2 93%   BMI 29.99 kg/m   Physical Exam  Constitutional: She has a sickly appearance. No distress.  Sickly, elderly appearing female with nasal cannula in place  HENT:  Head: Normocephalic and atraumatic.  Eyes: Conjunctivae and EOM are normal.  Cardiovascular:  Normal rate and regular rhythm.  Pulmonary/Chest: She has decreased breath sounds.  Abdominal: She exhibits no distension.  Musculoskeletal: She exhibits no edema.  Neurological: She is alert. She displays atrophy. No cranial nerve deficit.  Skin: Skin is warm and dry.  Psychiatric: She is slowed and withdrawn. Cognition and memory are impaired.  Nursing note and vitals reviewed.    ED Treatments / Results   Radiology Dg Chest 2 View  Result Date: 11/14/2017 CLINICAL DATA:  HIV which shortness of breath and diminished breath sounds. EXAM: CHEST  2 VIEW COMPARISON:  Chest CT 10/17/2017 FINDINGS: Mild atelectatic type opacity at the bases, improved from comparison CT. There is chronic marked enlargement of the pulmonary arterial  tree. Normal heart size. No effusion, edema, or pneumothorax. IMPRESSION: 1. Mild scarring or atelectasis at the bases. 2. Chronic marked enlargement of the pulmonary arteries, implied pulmonary hypertension. Electronically Signed   By: Monte Fantasia M.D.   On: 11/14/2017 13:24    Procedures Procedures (including critical care time)  Medications Ordered in ED Medications  albuterol (PROVENTIL HFA;VENTOLIN HFA) 108 (90 Base) MCG/ACT inhaler 1-2 puff (not administered)  albuterol (PROVENTIL) (2.5 MG/3ML) 0.083% nebulizer solution 5 mg (5 mg Nebulization Given 11/14/17 1323)  dexamethasone (DECADRON) injection 10 mg (10 mg Intramuscular Given 11/14/17 1357)     Initial Impression / Assessment and Plan / ED Course  I have reviewed the triage vital signs and the nursing notes.  Pertinent labs & imaging results that were available during my care of the patient were reviewed by me and considered in my medical decision making (see chart for details).   After initial breathing treatment the patient appears more calm, states that she feels better.  2:45 PM Patient smiling. I discussed findings with patient and her female companion, family member. We discussed  the absence of evidence for pneumonia.  When given the patient's unremarkable vital signs, her smiling status, her denial of any ongoing complaints, there is suspicion for COPD exacerbation, though the patient will follow up with primary care for additional thoughts about her possible pulmonary hypertension. Given substantial improvement, she will start on a course of steroids, provided an albuterol inhaler, discharged in stable condition.  Final Clinical Impressions(s) / ED Diagnoses  COPD exacerbation   Carmin Muskrat, MD 11/14/17 1446

## 2017-11-14 NOTE — ED Notes (Signed)
sats remained 95% while off 02

## 2017-11-14 NOTE — ED Triage Notes (Signed)
Caregiver states pt is not breathing right since last night Pt resides in family care home Dr Legrand Rams not in office today Caregiver put pt on O2 which helped

## 2017-11-14 NOTE — ED Notes (Signed)
Pt taken to xray 

## 2017-11-14 NOTE — ED Notes (Signed)
RT at bedside.

## 2017-11-14 NOTE — Discharge Instructions (Signed)
As discussed, your evaluation today has been largely reassuring.  But, it is important that you monitor your condition carefully, and do not hesitate to return to the ED if you develop new, or concerning changes in your condition.  Otherwise, please follow-up with your physician for appropriate ongoing care.  In addition to the prescribed steroids, please use the provided albuterol inhaler every 4 hours or 6 hours as needed for additional breathing improvement.

## 2017-11-27 ENCOUNTER — Encounter: Payer: Self-pay | Admitting: Orthopaedic Surgery

## 2017-11-27 ENCOUNTER — Ambulatory Visit: Payer: Medicaid Other | Admitting: Orthopaedic Surgery

## 2017-11-27 VITALS — BP 113/79 | HR 83 | Temp 97.9°F | Ht 74.0 in | Wt 217.0 lb

## 2017-11-27 DIAGNOSIS — G8929 Other chronic pain: Secondary | ICD-10-CM | POA: Diagnosis not present

## 2017-11-27 DIAGNOSIS — M25561 Pain in right knee: Secondary | ICD-10-CM | POA: Diagnosis not present

## 2017-11-27 NOTE — Progress Notes (Signed)
CC:  I have pain of my right knee. I would like an injection.  The patient has chronic pain of the right knee.  There is no recent trauma.  There is no redness.  Injections in the past have helped.  The knee has no redness, has an effusion and crepitus present.  ROM of the right knee is 0-100.  Impression:  Chronic knee pain right  Return: 6 weeks  PROCEDURE NOTE:  The patient requests injections of the right knee , verbal consent was obtained.  The right knee was prepped appropriately after time out was performed.   Sterile technique was observed and injection of 1 cc of Depo-Medrol 40 mg with several cc's of plain xylocaine. Anesthesia was provided by ethyl chloride and a 20-gauge needle was used to inject the knee area. The injection was tolerated well.  A band aid dressing was applied.  The patient was advised to apply ice later today and tomorrow to the injection sight as needed.  Electronically Signed Sanjuana Kava, MD 1/3/20192:29 PM

## 2018-01-07 ENCOUNTER — Encounter: Payer: Self-pay | Admitting: Orthopaedic Surgery

## 2018-01-07 ENCOUNTER — Ambulatory Visit: Payer: Medicaid Other | Admitting: Orthopaedic Surgery

## 2018-01-08 ENCOUNTER — Ambulatory Visit: Payer: Medicaid Other | Admitting: Orthopaedic Surgery

## 2018-01-14 ENCOUNTER — Other Ambulatory Visit (HOSPITAL_COMMUNITY): Payer: Self-pay | Admitting: Internal Medicine

## 2018-01-14 DIAGNOSIS — Z1231 Encounter for screening mammogram for malignant neoplasm of breast: Secondary | ICD-10-CM

## 2018-01-15 ENCOUNTER — Other Ambulatory Visit (HOSPITAL_COMMUNITY): Payer: Self-pay | Admitting: Internal Medicine

## 2018-01-15 DIAGNOSIS — Z9889 Other specified postprocedural states: Secondary | ICD-10-CM

## 2018-01-19 ENCOUNTER — Ambulatory Visit (HOSPITAL_COMMUNITY): Payer: Medicaid Other

## 2018-01-27 ENCOUNTER — Other Ambulatory Visit (HOSPITAL_COMMUNITY): Payer: Self-pay | Admitting: Internal Medicine

## 2018-01-27 ENCOUNTER — Encounter (HOSPITAL_COMMUNITY): Payer: Self-pay

## 2018-01-27 ENCOUNTER — Encounter (HOSPITAL_COMMUNITY): Payer: Self-pay | Admitting: Emergency Medicine

## 2018-01-27 ENCOUNTER — Other Ambulatory Visit: Payer: Self-pay

## 2018-01-27 ENCOUNTER — Ambulatory Visit (HOSPITAL_COMMUNITY)
Admission: RE | Admit: 2018-01-27 | Discharge: 2018-01-27 | Disposition: A | Discharge status: Home / Self Care | Payer: Medicaid Other | Source: Ambulatory Visit | Attending: Internal Medicine | Admitting: Internal Medicine

## 2018-01-27 ENCOUNTER — Emergency Department (HOSPITAL_COMMUNITY)
Admission: EM | Admit: 2018-01-27 | Discharge: 2018-01-27 | Disposition: A | Discharge status: Home / Self Care | Payer: Medicaid Other | Attending: Emergency Medicine | Admitting: Emergency Medicine

## 2018-01-27 ENCOUNTER — Emergency Department (HOSPITAL_COMMUNITY): Payer: Medicaid Other

## 2018-01-27 DIAGNOSIS — J449 Chronic obstructive pulmonary disease, unspecified: Secondary | ICD-10-CM | POA: Insufficient documentation

## 2018-01-27 DIAGNOSIS — F17210 Nicotine dependence, cigarettes, uncomplicated: Secondary | ICD-10-CM | POA: Insufficient documentation

## 2018-01-27 DIAGNOSIS — I10 Essential (primary) hypertension: Secondary | ICD-10-CM | POA: Diagnosis not present

## 2018-01-27 DIAGNOSIS — Z9889 Other specified postprocedural states: Secondary | ICD-10-CM

## 2018-01-27 DIAGNOSIS — Z79899 Other long term (current) drug therapy: Secondary | ICD-10-CM | POA: Diagnosis not present

## 2018-01-27 DIAGNOSIS — Z21 Asymptomatic human immunodeficiency virus [HIV] infection status: Secondary | ICD-10-CM | POA: Diagnosis not present

## 2018-01-27 DIAGNOSIS — R05 Cough: Secondary | ICD-10-CM

## 2018-01-27 DIAGNOSIS — R059 Cough, unspecified: Secondary | ICD-10-CM

## 2018-01-27 DIAGNOSIS — R509 Fever, unspecified: Secondary | ICD-10-CM | POA: Diagnosis not present

## 2018-01-27 DIAGNOSIS — E119 Type 2 diabetes mellitus without complications: Secondary | ICD-10-CM | POA: Insufficient documentation

## 2018-01-27 DIAGNOSIS — Z1231 Encounter for screening mammogram for malignant neoplasm of breast: Secondary | ICD-10-CM

## 2018-01-27 DIAGNOSIS — Z794 Long term (current) use of insulin: Secondary | ICD-10-CM | POA: Diagnosis not present

## 2018-01-27 LAB — CBC WITH DIFFERENTIAL/PLATELET
BASOS ABS: 0 10*3/uL (ref 0.0–0.1)
Basophils Relative: 0 %
EOS ABS: 0.3 10*3/uL (ref 0.0–0.7)
Eosinophils Relative: 3 %
HCT: 42.2 % (ref 36.0–46.0)
HEMOGLOBIN: 13.4 g/dL (ref 12.0–15.0)
LYMPHS ABS: 4.4 10*3/uL — AB (ref 0.7–4.0)
LYMPHS PCT: 54 %
MCH: 30.8 pg (ref 26.0–34.0)
MCHC: 31.8 g/dL (ref 30.0–36.0)
MCV: 97 fL (ref 78.0–100.0)
Monocytes Absolute: 1 10*3/uL (ref 0.1–1.0)
Monocytes Relative: 13 %
NEUTROS PCT: 30 %
Neutro Abs: 2.4 10*3/uL (ref 1.7–7.7)
Platelets: 188 10*3/uL (ref 150–400)
RBC: 4.35 MIL/uL (ref 3.87–5.11)
RDW: 15.5 % (ref 11.5–15.5)
WBC: 8.1 10*3/uL (ref 4.0–10.5)

## 2018-01-27 LAB — COMPREHENSIVE METABOLIC PANEL
ALK PHOS: 88 U/L (ref 38–126)
ALT: 14 U/L (ref 14–54)
ANION GAP: 13 (ref 5–15)
AST: 18 U/L (ref 15–41)
Albumin: 3.8 g/dL (ref 3.5–5.0)
BILIRUBIN TOTAL: 0.8 mg/dL (ref 0.3–1.2)
BUN: 17 mg/dL (ref 6–20)
CALCIUM: 9.2 mg/dL (ref 8.9–10.3)
CO2: 25 mmol/L (ref 22–32)
Chloride: 103 mmol/L (ref 101–111)
Creatinine, Ser: 1.09 mg/dL — ABNORMAL HIGH (ref 0.44–1.00)
GFR calc Af Amer: >60 mL/min (ref >60)
GFR calc non Af Amer: 56 mL/min — ABNORMAL LOW (ref >60)
Glucose, Bld: 95 mg/dL (ref 65–99)
Potassium: 4.8 mmol/L (ref 3.5–5.1)
Sodium: 141 mmol/L (ref 135–145)
TOTAL PROTEIN: 7.4 g/dL (ref 6.5–8.1)

## 2018-01-27 LAB — URINALYSIS, ROUTINE W REFLEX MICROSCOPIC
Glucose, UA: NEGATIVE mg/dL
Hgb urine dipstick: NEGATIVE
Ketones, ur: 5 mg/dL — AB
NITRITE: NEGATIVE
PH: 5 (ref 5.0–8.0)
Protein, ur: 100 mg/dL — AB
Specific Gravity, Urine: 1.034 — ABNORMAL HIGH (ref 1.005–1.030)

## 2018-01-27 LAB — CBC
HCT: 44.1 % (ref 36.0–46.0)
HEMOGLOBIN: 13.4 g/dL (ref 12.0–15.0)
MCH: 29.9 pg (ref 26.0–34.0)
MCHC: 30.4 g/dL (ref 30.0–36.0)
MCV: 98.4 fL (ref 78.0–100.0)
Platelets: 209 10*3/uL (ref 150–400)
RBC: 4.48 MIL/uL (ref 3.87–5.11)
RDW: 15.6 % — AB (ref 11.5–15.5)
WBC: 6.9 10*3/uL (ref 4.0–10.5)

## 2018-01-27 LAB — LIPASE, BLOOD: Lipase: 26 U/L (ref 11–51)

## 2018-01-27 LAB — I-STAT CG4 LACTIC ACID, ED: Lactic Acid, Venous: 0.88 mmol/L (ref 0.5–1.9)

## 2018-01-27 LAB — CBG MONITORING, ED: Glucose-Capillary: 92 mg/dL (ref 65–99)

## 2018-01-27 MED ORDER — VANCOMYCIN HCL IN DEXTROSE 1-5 GM/200ML-% IV SOLN
1000.0000 mg | Freq: Once | INTRAVENOUS | Status: AC
  Administered 2018-01-27: 1000 mg via INTRAVENOUS
  Filled 2018-01-27: qty 200

## 2018-01-27 MED ORDER — SODIUM CHLORIDE 0.9 % IV BOLUS (SEPSIS)
2000.0000 mL | Freq: Once | INTRAVENOUS | Status: AC
  Administered 2018-01-27: 2000 mL via INTRAVENOUS

## 2018-01-27 MED ORDER — DOXYCYCLINE HYCLATE 100 MG PO CAPS: 100.0000 mg | ORAL_CAPSULE | Freq: Two times a day (BID) | ORAL | 0 refills | Status: DC

## 2018-01-27 MED ORDER — PIPERACILLIN-TAZOBACTAM 3.375 G IVPB 30 MIN
3.3750 g | Freq: Once | INTRAVENOUS | Status: AC
  Administered 2018-01-27: 3.375 g via INTRAVENOUS
  Filled 2018-01-27: qty 50

## 2018-01-27 NOTE — ED Provider Notes (Signed)
[facility] EMERGENCY DEPARTMENT Provider Note   CSN: [id number] Arrival date & time: 01/27/18  1454     History   Chief Complaint Chief Complaint  Patient presents with  . Cough    HPI Coty Student is a 57 y.o. female.  Patient has had a cough for a week.  And also has had a fever for a few days.  She did get her flu shot   The history is provided by the patient and a caregiver. No language interpreter was used.  Cough  This is a new problem. The current episode started more than 2 days ago. The problem occurs constantly. The problem has not changed since onset.The cough is non-productive. The maximum temperature recorded prior to her arrival was 100 to 100.9 F. Pertinent negatives include no chest pain and no headaches.    Past Medical History:  Diagnosis Date  . 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 Bloating    per sister  . Chronic right hip pain    sister states circulation problem in hip  . Family history of adverse reaction to anesthesia    sister has hx. of post-op N/V  . High cholesterol   . HIV (human immunodeficiency virus infection) ([place])   . Hypertension   . Limp   . Mild intellectual disability   . No natural teeth    does not wear her dentures  . Paranoid schizophrenia ([place])   . Personality disorder ([place])   . Schizophrenia [facility])     Patient Active Problem List   Diagnosis Date Noted  . Diabetes mellitus, new onset ([place]) 10/24/2017  . Hyperkalemia 10/24/2017  . ARF (acute renal failure) ([place]) 10/24/2017  . COPD exacerbation ([place]) 10/21/2017  . HCAP (healthcare-associated pneumonia) 10/12/2017  . Acute respiratory failure with hypoxia ([place]) 10/12/2017  . AIDS ([place]) 10/12/2017  . Influenza 12/26/2016  . Sepsis ([place]) 12/26/2016  . CAP (community acquired pneumonia) 12/26/2016  . Tobacco use disorder 12/26/2016  . HIV (human immunodeficiency virus infection) ([place]) 12/26/2016  . Schizophrenia ([place]) 12/26/2016  . Personality  disorder ([place]) 08/22/2016  . Encounter for screening colonoscopy 05/24/2015    Past Surgical History:  Procedure Laterality Date  . BREAST LUMPECTOMY WITH NEEDLE LOCALIZATION Left 07/28/2015   Procedure: BREAST LUMPECTOMY WITH NEEDLE LOCALIZATION;  Surgeon: Autumn Messing III, MD;  Location: [place];  Service: General;  Laterality: Left;  . COLONOSCOPY WITH PROPOFOL N/A 06/13/2015   SLF: 1. the left colon is redundant 2. the examination was otherwise normal 3. small internal hemorrhoids 4. moderate sized external hemorrhoids  . HEMORRHOID SURGERY    . UMBILICAL HERNIA REPAIR      OB History    No data available       Home Medications    Prior to Admission medications   Medication Sig Start Date End Date Taking? Authorizing Provider  abacavir-lamiVUDine (EPZICOM) 600-300 MG per tablet Take 1 tablet by mouth daily.   Yes [provider]  acetaminophen (TYLENOL) 650 MG CR tablet Take 650 mg by mouth every 8 (eight) hours as needed for pain.   Yes [provider]  acetaminophen (TYLENOL) 650 MG suppository Place 650 mg rectally every 6 (six) hours as needed for moderate pain.   Yes [provider]  albuterol (PROVENTIL HFA;VENTOLIN HFA) 108 (90 Base) MCG/ACT inhaler Inhale 1-2 puffs into the lungs 2 (two) times daily as needed for wheezing or shortness of breath.   Yes [provider]  atazanavir (  REYATAZ) 200 MG capsule Take 400 mg by mouth daily.    Yes [provider]  atorvastatin (LIPITOR) 10 MG tablet Take 10 mg by mouth 2 (two) times daily.   Yes [provider]  divalproex (DEPAKOTE) 500 MG DR tablet Take 500 mg by mouth 2 (two) times daily. Take 1 tablet (500 mg) by mouth at 4pm and 8pm daily   Yes [provider]  haloperidol (HALDOL) 10 MG tablet Take 20 mg by mouth 3 (three) times daily.    Yes [provider]  haloperidol decanoate (HALDOL DECANOATE) 100 MG/ML injection Inject 200 mg into the  muscle every 28 (twenty-eight) days. Last injection 06/09/15    Yes [provider]  hydrOXYzine (VISTARIL) 25 MG capsule Take 25 mg by mouth at bedtime.    Yes [provider]  ibuprofen (ADVIL,MOTRIN) 400 MG tablet Take 400 mg by mouth every 8 (eight) hours as needed for moderate pain.   Yes [provider]  insulin aspart (NOVOLOG) 100 UNIT/ML injection Inject 0-20 Units into the skin 3 (three) times daily with meals.   Yes [provider]  insulin detemir (LEVEMIR) 100 UNIT/ML injection Inject 0.33 mLs (33 Units total) into the skin at bedtime. 10/27/17  Yes Isaac Bliss, Rayford Halsted, MD  Magnesium Oxide 400 MG CAPS Take 1 capsule (400 mg total) by mouth every morning. 08/10/17  Yes Reyne Dumas, MD  polyethylene glycol (MIRALAX / GLYCOLAX) packet Take 17 g by mouth daily as needed for mild constipation.    Yes [provider]  QUEtiapine (SEROQUEL) 200 MG tablet Take 200 mg by mouth 2 (two) times daily. Take 1 tablet (200 mg) by mouth with supper and at bedtime daily   Yes [provider]  raltegravir (ISENTRESS) 400 MG tablet Take 400 mg by mouth 2 (two) times daily.   Yes [provider]  trihexyphenidyl (ARTANE) 2 MG tablet Take 2 mg by mouth 2 (two) times daily with a meal.   Yes [provider]  valACYclovir (VALTREX) 1000 MG tablet Take 1,000 mg by mouth daily.    Yes [provider]  zolpidem (AMBIEN) 10 MG tablet Take 5 mg by mouth at bedtime.    Yes [provider]  doxycycline (VIBRAMYCIN) 100 MG capsule Take 1 capsule (100 mg total) by mouth 2 (two) times daily. One po bid x 7 days 01/27/18   Milton Ferguson, MD    Family History Family History  Problem Relation Age of Onset  . Anesthesia problems Sister        post-op N/V    Social History Social History   Tobacco Use  . Smoking status: Current Every Day Smoker    Packs/day: 1.00    Years: 18.00    Pack years: 18.00    Types:  Cigarettes  . Smokeless tobacco: Never Used  Substance Use Topics  . Alcohol use: No    Alcohol/week: 0.0 oz  . Drug use: No     Allergies   Asa [aspirin]   Review of Systems Review of Systems  Constitutional: Negative for appetite change and fatigue.  HENT: Negative for congestion, ear discharge and sinus pressure.   Eyes: Negative for discharge.  Respiratory: Positive for cough.   Cardiovascular: Negative for chest pain.  Gastrointestinal: Negative for abdominal pain and diarrhea.  Genitourinary: Negative for frequency and hematuria.  Musculoskeletal: Negative for back pain.  Skin: Negative for rash.  Neurological: Negative for seizures and headaches.  Psychiatric/Behavioral: Negative for hallucinations.  Physical Exam Updated Vital Signs BP (!) 120/95   Pulse 91   Temp 98.6 F (37 C) (Oral)   Resp 16   SpO2 94%   Physical Exam  Constitutional: She is oriented to person, place, and time. She appears well-developed.  HENT:  Head: Normocephalic.  Eyes: Conjunctivae and EOM are normal. No scleral icterus.  Neck: Neck supple. No thyromegaly present.  Cardiovascular: Normal rate and regular rhythm. Exam reveals no gallop and no friction rub.  No murmur heard. Pulmonary/Chest: No stridor. She has wheezes. She has no rales. She exhibits no tenderness.  Abdominal: She exhibits no distension. There is no tenderness. There is no rebound.  Musculoskeletal: Normal range of motion. She exhibits no edema.  Lymphadenopathy:    She has no cervical adenopathy.  Neurological: She is oriented to person, place, and time. She exhibits normal muscle tone. Coordination normal.  Skin: No rash noted. No erythema.  Psychiatric: She has a normal mood and affect. Her behavior is normal.     ED Treatments / Results  Labs (all labs ordered are listed, but only abnormal results are displayed) Labs Reviewed  COMPREHENSIVE METABOLIC PANEL - Abnormal; Notable for the following  components:      Result Value   Creatinine, Ser 1.09 (*)    GFR calc non Af Amer 56 (*)    All other components within normal limits  CBC - Abnormal; Notable for the following components:   RDW 15.6 (*)    All other components within normal limits  URINALYSIS, ROUTINE W REFLEX MICROSCOPIC - Abnormal; Notable for the following components:   Color, Urine AMBER (*)    APPearance CLOUDY (*)    Specific Gravity, Urine 1.034 (*)    Bilirubin Urine SMALL (*)    Ketones, ur 5 (*)    Protein, ur 100 (*)    Leukocytes, UA SMALL (*)    Bacteria, UA RARE (*)    Squamous Epithelial / LPF TOO NUMEROUS TO COUNT (*)    Non Squamous Epithelial 0-5 (*)    All other components within normal limits  CBC WITH DIFFERENTIAL/PLATELET - Abnormal; Notable for the following components:   Lymphs Abs 4.4 (*)    All other components within normal limits  CULTURE, BLOOD (ROUTINE X 2)  CULTURE, BLOOD (ROUTINE X 2)  LIPASE, BLOOD  CBC WITH DIFFERENTIAL/PLATELET  CBG MONITORING, ED  I-STAT CG4 LACTIC ACID, ED    EKG  EKG Interpretation  Date/Time:  Tuesday January 27 2018 20:08:55 EST Ventricular Rate:  101 PR Interval:    QRS Duration: 105 QT Interval:  352 QTC Calculation: 457 R Axis:   -74 Text Interpretation:  Sinus tachycardia LAE, consider biatrial enlargement Right ventricular hypertrophy Inferior infarct, old Lateral leads are also involved Baseline wander in lead(s) I III aVL Confirmed by Milton Ferguson [phone]) on 01/27/2018 9:24:11 PM       Radiology Dg Chest Portable 1 View  Result Date: 01/27/2018 CLINICAL DATA:  Cough, congestion, and abdominal pain. History of HIV. EXAM: PORTABLE CHEST 1 VIEW COMPARISON:  11/14/2017 FINDINGS: The cardiac silhouette is normal in size. Prominent central pulmonary arterial enlargement is again noted. Mild medial right lung base opacity is similar to the prior study with appearance suggesting scarring or atelectasis. No definite acute airspace consolidation,  edema, sizable pleural effusion, pneumothorax is identified. No acute osseous abnormality is seen. IMPRESSION: Mild scarring or atelectasis in the right lung base. Electronically Signed   By: Logan Bores M.D.   On: 01/27/2018  20:52    Procedures Procedures (including critical care time)  Medications Ordered in ED Medications  sodium chloride 0.9 % bolus 2,000 mL (2,000 mLs Intravenous New Bag/Given 01/27/18 2024)  vancomycin (VANCOCIN) IVPB 1000 mg/200 mL premix (0 mg Intravenous Stopped 01/27/18 2204)  piperacillin-tazobactam (ZOSYN) IVPB 3.375 g (0 g Intravenous Stopped 01/27/18 2101)     Initial Impression / Assessment and Plan / ED Course  I have reviewed the triage vital signs and the nursing notes.  Pertinent labs & imaging results that were available during my care of the patient were reviewed by me and considered in my medical decision making (see chart for details). Labs unremarkable including CBC chemistries lactic acid.  Chest x-ray showed no pneumonia.  Patient was hypotensive initially but improved with fluids.  Suspect dehydration.  Patient will be sent home with Levaquin to cover possible pneumonia not seen on x-ray the caregiver was told to give the patient fluids Tylenol follow-up with her PCP in a couple days      Final Clinical Impressions(s) / ED Diagnoses   Final diagnoses:  Cough    ED Discharge Orders        Ordered    doxycycline (VIBRAMYCIN) 100 MG capsule  2 times daily     01/27/18 2206       Milton Ferguson, MD 01/27/18 2208

## 2018-01-27 NOTE — ED Triage Notes (Signed)
[facility] from [facility]. Pt c/o of cough, congestion, and abdominal pain.  Glucose elevated at home.

## 2018-01-27 NOTE — Discharge Instructions (Signed)
Drink plenty of fluids and take Tylenol for any fever or aches.  Stop smoking.  Follow-up with your doctor in 2-3 days for recheck

## 2018-01-27 NOTE — ED Notes (Signed)
Quincy Sheehan, RN aware of Code Sepsis.

## 2018-01-27 NOTE — ED Notes (Signed)
Pt alert & oriented x4, stable gait. Patient given discharge instructions, paperwork & prescription(s). Patient verbalized understanding. Pt left department in wheelchair escorted by staff. Pt left w/ no further questions. 

## 2018-01-28 NOTE — ED Notes (Signed)
Pt is a resident of [facility].  Spoke with caregiver, Virgel Bouquet about positive blood culture  and was told that she thought pt was feeling better but she will monitor pt and bring her back to ER if she has any concerns.

## 2018-01-28 NOTE — ED Notes (Signed)
CRITICAL VALUE ALERT  Critical Value:  Gram positive cocci on aerobic bottle  Date & Time Notied:  01/28/2018, 1544  Provider Notified: Dr. Rogene Houston  Orders Received/Actions taken: at this time only 1 bottle has resulted positive.  Instructed to call and check on pt to see if pt has improved or gotten worse.  If pt not feeling any better, instruct pt to return per Dr. Rogene Houston

## 2018-01-29 LAB — BLOOD CULTURE ID PANEL (REFLEXED)
Acinetobacter baumannii: NOT DETECTED
CANDIDA ALBICANS: NOT DETECTED
CANDIDA GLABRATA: NOT DETECTED
CANDIDA KRUSEI: NOT DETECTED
Candida parapsilosis: NOT DETECTED
Candida tropicalis: NOT DETECTED
ENTEROBACTER CLOACAE COMPLEX: NOT DETECTED
ENTEROBACTERIACEAE SPECIES: NOT DETECTED
ENTEROCOCCUS SPECIES: NOT DETECTED
ESCHERICHIA COLI: NOT DETECTED
Haemophilus influenzae: NOT DETECTED
Klebsiella oxytoca: NOT DETECTED
Klebsiella pneumoniae: NOT DETECTED
LISTERIA MONOCYTOGENES: NOT DETECTED
Methicillin resistance: DETECTED — AB
NEISSERIA MENINGITIDIS: NOT DETECTED
PROTEUS SPECIES: NOT DETECTED
Pseudomonas aeruginosa: NOT DETECTED
STAPHYLOCOCCUS SPECIES: DETECTED — AB
STREPTOCOCCUS PNEUMONIAE: NOT DETECTED
STREPTOCOCCUS PYOGENES: NOT DETECTED
STREPTOCOCCUS SPECIES: NOT DETECTED
Serratia marcescens: NOT DETECTED
Staphylococcus aureus (BCID): NOT DETECTED
Streptococcus agalactiae: NOT DETECTED

## 2018-01-30 LAB — CULTURE, BLOOD (ROUTINE X 2): SPECIAL REQUESTS: ADEQUATE

## 2018-01-31 ENCOUNTER — Telehealth: Payer: Self-pay | Admitting: *Deleted

## 2018-02-01 LAB — CULTURE, BLOOD (ROUTINE X 2)
Culture: NO GROWTH
Special Requests: ADEQUATE

## 2018-02-11 ENCOUNTER — Ambulatory Visit: Payer: Medicaid Other | Admitting: Adult Health

## 2018-02-11 ENCOUNTER — Encounter: Payer: Self-pay | Admitting: Adult Health

## 2018-02-11 ENCOUNTER — Other Ambulatory Visit (HOSPITAL_COMMUNITY)
Admission: RE | Admit: 2018-02-11 | Discharge: 2018-02-11 | Disposition: A | Discharge status: Home / Self Care | Payer: Medicaid Other | Source: Ambulatory Visit | Attending: Adult Health | Admitting: Adult Health

## 2018-02-11 VITALS — BP 130/70 | HR 82 | Resp 16 | Ht 73.0 in | Wt 222.5 lb

## 2018-02-11 DIAGNOSIS — Z1212 Encounter for screening for malignant neoplasm of rectum: Secondary | ICD-10-CM

## 2018-02-11 DIAGNOSIS — Z01419 Encounter for gynecological examination (general) (routine) without abnormal findings: Secondary | ICD-10-CM

## 2018-02-11 DIAGNOSIS — R5383 Other fatigue: Secondary | ICD-10-CM | POA: Diagnosis not present

## 2018-02-11 DIAGNOSIS — R1032 Left lower quadrant pain: Secondary | ICD-10-CM | POA: Diagnosis not present

## 2018-02-11 DIAGNOSIS — Z1211 Encounter for screening for malignant neoplasm of colon: Secondary | ICD-10-CM

## 2018-02-11 DIAGNOSIS — Z0001 Encounter for general adult medical examination with abnormal findings: Secondary | ICD-10-CM | POA: Diagnosis not present

## 2018-02-11 LAB — HEMOCCULT GUIAC POC 1CARD (OFFICE): Fecal Occult Blood, POC: NEGATIVE

## 2018-02-11 NOTE — Progress Notes (Signed)
Subjective:     Patient ID: Karina Clarke, female   DOB: 1960/11/27, 57 y.o.   MRN: [id number]  HPI Karina Clarke is a 57 year old black female, who resides at [facility], in for pap and pelvic, she had physical with PCP.And she is seen at HIV clinic.  PCP is Dr Legrand Rams.   Review of Systems Patient denies any headaches, hearing loss, blurred vision, shortness of breath, chest pain, abdominal pain, problems with bowel movements, urination, or intercourse(not having sex). No joint pain or mood swings.Vaginal burns at times +tired.  Reviewed past medical,surgical, social and family history. Reviewed medications and allergies.     Objective:   Physical Exam BP 130/70 (BP Location: Left Arm, Patient Position: Sitting, Cuff Size: Normal)   Pulse 82   Resp 16   Ht 6\' 1"  (1.854 m)   Wt 222 lb 8 oz (100.9 kg)   BMI 29.36 kg/m  Skin warm and dry.Pelvic: external genitalia is normal in appearance no lesions, vagina: pale pink with loss of moisture and rugae,urethra has no lesions or masses noted, cervix:smooth, pap with HPV performed, uterus: normal size, shape and contour, non tender, no masses felt, adnexa: no masses, LLQ tenderness noted. Bladder is non tender and no masses felt.  On rectal exam has good tone, no masses, hemoccult is negative.  PHQ 2 score 0.     Assessment:     1. Encounter for gynecological examination with Papanicolaou smear of cervix   2. Screening for colorectal cancer   3. LLQ pain       Plan:     Return in 2 weeks for GYN US Pelvic in 1 year, pap in 3 years if normal Mammogram every 2 years Labs with PCP Colonoscopy per GI

## 2018-02-13 LAB — CYTOLOGY - PAP
Adequacy: ABSENT
Diagnosis: NEGATIVE
HPV (WINDOPATH): NOT DETECTED

## 2018-02-23 ENCOUNTER — Ambulatory Visit: Payer: Medicaid Other | Admitting: Orthopedic Surgery

## 2018-02-23 ENCOUNTER — Encounter: Payer: Self-pay | Admitting: Orthopedic Surgery

## 2018-02-23 VITALS — BP 122/77 | HR 125 | Ht 73.0 in | Wt 220.0 lb

## 2018-02-23 DIAGNOSIS — G8929 Other chronic pain: Secondary | ICD-10-CM

## 2018-02-23 DIAGNOSIS — M25561 Pain in right knee: Secondary | ICD-10-CM

## 2018-02-23 NOTE — Progress Notes (Signed)
Chief Complaint  Patient presents with  . Knee Pain    right     Requested injection right   Procedure note right knee injection verbal consent was obtained to inject right knee joint  Timeout was completed to confirm the site of injection  The medications used were 40 mg of Depo-Medrol and 1% lidocaine 3 cc  Anesthesia was provided by ethyl chloride and the skin was prepped with alcohol.  After cleaning the skin with alcohol a 20-gauge needle was used to inject the right knee joint. There were no complications. A sterile bandage was applied.   Encounter Diagnosis  Name Primary?  . Chronic pain of right knee Yes

## 2018-02-25 ENCOUNTER — Ambulatory Visit (INDEPENDENT_AMBULATORY_CARE_PROVIDER_SITE_OTHER): Payer: Medicaid Other

## 2018-02-25 DIAGNOSIS — R1032 Left lower quadrant pain: Secondary | ICD-10-CM

## 2018-02-25 NOTE — Progress Notes (Signed)
PELVIC US TA/TV: homogenous retroverted uterus,wnl,EEC 1.5 mm,normal ovaries bilat,no free fluid,ovaries appear mobile,pelvic pain during ultrasound

## 2018-03-02 ENCOUNTER — Telehealth: Payer: Self-pay | Admitting: Adult Health

## 2018-03-02 NOTE — Telephone Encounter (Signed)
Spoke with Mrs [facility] at the care home to let know [place] was normal

## 2018-05-26 ENCOUNTER — Ambulatory Visit: Payer: Medicaid Other | Admitting: Orthopaedic Surgery

## 2018-05-27 ENCOUNTER — Ambulatory Visit (INDEPENDENT_AMBULATORY_CARE_PROVIDER_SITE_OTHER): Payer: Medicaid Other | Admitting: Orthopaedic Surgery

## 2018-05-27 ENCOUNTER — Encounter: Payer: Self-pay | Admitting: Orthopaedic Surgery

## 2018-05-27 DIAGNOSIS — G8929 Other chronic pain: Secondary | ICD-10-CM | POA: Diagnosis not present

## 2018-05-27 DIAGNOSIS — M25561 Pain in right knee: Secondary | ICD-10-CM

## 2018-05-27 DIAGNOSIS — F17210 Nicotine dependence, cigarettes, uncomplicated: Secondary | ICD-10-CM

## 2018-05-27 NOTE — Progress Notes (Signed)
CC:  I have pain of my right knee. I would like an injection.  The patient has chronic pain of the right knee.  There is no recent trauma.  There is no redness.  Injections in the past have helped.  The knee has no redness, has an effusion and crepitus present.  ROM of the right knee is 0-105.  Impression:  Chronic knee pain right  Return: 3 months  PROCEDURE NOTE:  The patient requests injections of the right knee , verbal consent was obtained.  The right knee was prepped appropriately after time out was performed.   Sterile technique was observed and injection of 1 cc of Depo-Medrol 40 mg with several cc's of plain xylocaine. Anesthesia was provided by ethyl chloride and a 20-gauge needle was used to inject the knee area. The injection was tolerated well.  A band aid dressing was applied.  The patient was advised to apply ice later today and tomorrow to the injection sight as needed.  Electronically Signed Sanjuana Kava, MD 7/3/20199:19 AM  Forms completed for the rest home.

## 2018-07-02 ENCOUNTER — Ambulatory Visit (HOSPITAL_COMMUNITY)
Admission: RE | Admit: 2018-07-02 | Discharge: 2018-07-02 | Disposition: A | Discharge status: Home / Self Care | Payer: Medicaid Other | Source: Ambulatory Visit | Attending: Internal Medicine | Admitting: Internal Medicine

## 2018-07-02 ENCOUNTER — Other Ambulatory Visit (HOSPITAL_COMMUNITY): Payer: Self-pay | Admitting: Internal Medicine

## 2018-07-02 DIAGNOSIS — M25511 Pain in right shoulder: Secondary | ICD-10-CM

## 2018-07-22 ENCOUNTER — Encounter: Payer: Self-pay | Admitting: Orthopaedic Surgery

## 2018-07-22 ENCOUNTER — Other Ambulatory Visit: Payer: Medicaid Other

## 2018-07-22 ENCOUNTER — Ambulatory Visit (INDEPENDENT_AMBULATORY_CARE_PROVIDER_SITE_OTHER): Payer: Medicaid Other | Admitting: Orthopaedic Surgery

## 2018-07-22 VITALS — BP 138/74 | HR 72 | Ht 71.0 in | Wt 222.0 lb

## 2018-07-22 DIAGNOSIS — F17210 Nicotine dependence, cigarettes, uncomplicated: Secondary | ICD-10-CM | POA: Diagnosis not present

## 2018-07-22 DIAGNOSIS — F609 Personality disorder, unspecified: Secondary | ICD-10-CM

## 2018-07-22 DIAGNOSIS — M25511 Pain in right shoulder: Secondary | ICD-10-CM | POA: Diagnosis not present

## 2018-07-22 MED ORDER — HYDROCODONE-ACETAMINOPHEN 5-325 MG PO TABS: ORAL_TABLET | ORAL | 0 refills | Status: DC

## 2018-07-22 NOTE — Progress Notes (Signed)
Patient Karina [facility], female DOB:06-04-1961, 56 y.o. [name]  Chief Complaint  Patient presents with  . Shoulder Pain    Right shoulder pain, referred by [facility].    HPI  Karina Clarke is a 57 y.o. female who has been having pain of the right shoulder for over a month or so.  She denies any trauma, any redness or numbness. It hurts to move it.  She has pain with any abduction.  She has some relief with Tylenol and heat.  She has seen [facility] for this and he asked that she come here.   Body mass index is 30.96 kg/m.  ROS  Review of Systems  Constitutional: Positive for activity change.  HENT: Negative for congestion.   Respiratory: Negative for cough and shortness of breath.   Cardiovascular: Negative for chest pain and leg swelling.  Endocrine: Positive for cold intolerance.  Musculoskeletal: Positive for arthralgias, gait problem and joint swelling.  Allergic/Immunologic: Positive for environmental allergies.  Psychiatric/Behavioral: The patient is nervous/anxious.   All other systems reviewed and are negative.   All other systems reviewed and are negative.  The following is a summary of the past history medically, past history surgically, known current medicines, social history and family history.  This information is gathered electronically by the computer from prior information and documentation.  I review this each visit and have found including this information at this point in the chart is beneficial and informative.    Past Medical History:  Diagnosis Date  . 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 Bloating    per sister  . Chronic right hip pain    sister states circulation problem in hip  . Family history of adverse reaction to anesthesia    sister has hx. of post-op N/V  . High cholesterol   . HIV (human immunodeficiency virus infection) ([facility])   . Hypertension   . Limp   . Mild intellectual disability   . No natural teeth    does not wear her dentures  . Paranoid schizophrenia ([facility])   . Personality disorder ([place])   . Schizophrenia [facility])     Past Surgical History:  Procedure Laterality Date  . BREAST LUMPECTOMY WITH NEEDLE LOCALIZATION Left 07/28/2015   Procedure: BREAST LUMPECTOMY WITH NEEDLE LOCALIZATION;  Surgeon: Karina Messing III, MD;  Location: [facility];  Service: General;  Laterality: Left;  . COLONOSCOPY WITH PROPOFOL N/A 06/13/2015   SLF: 1. the left colon is redundant 2. the examination was otherwise normal 3. small internal hemorrhoids 4. moderate sized external hemorrhoids  . HEMORRHOID SURGERY    . UMBILICAL HERNIA REPAIR      Family History  Problem Relation Age of Onset  . Anesthesia problems Sister        post-op N/V  . Diabetes Father     Social History Social History   Tobacco Use  . Smoking status: Current Every Day Smoker    Packs/day: 1.00    Years: 18.00    Pack years: 18.00    Types: Cigarettes  . Smokeless tobacco: Never Used  Substance Use Topics  . Alcohol use: No    Alcohol/week: 0.0 standard drinks  . Drug use: No    Allergies  Allergen Reactions  . Asa [Aspirin] Nausea And Vomiting    Current Outpatient Medications  Medication Sig Dispense Refill  . abacavir-lamiVUDine (EPZICOM) 600-300 MG per tablet Take 1 tablet by mouth daily.    [name] acetaminophen (TYLENOL) 650 MG  CR tablet Take 650 mg by mouth every 8 (eight) hours as needed for pain.    [name] acetaminophen (TYLENOL) 650 MG suppository Place 650 mg rectally every 6 (six) hours as needed for moderate pain.    [name] albuterol (PROVENTIL HFA;VENTOLIN HFA) 108 (90 Base) MCG/ACT inhaler Inhale 1-2 puffs into the lungs 2 (two) times daily as needed for wheezing or shortness of breath.    [name] atazanavir (REYATAZ) 200 MG capsule Take 400 mg by mouth daily.     [name] atorvastatin (LIPITOR) 10 MG tablet Take 10 mg by mouth 2 (two) times daily.    . divalproex (DEPAKOTE) 500 MG DR tablet Take 500 mg by mouth 2 (two)  times daily. Take 1 tablet (500 mg) by mouth at 4pm and 8pm daily    . haloperidol (HALDOL) 10 MG tablet Take 20 mg by mouth 3 (three) times daily.     . haloperidol decanoate (HALDOL DECANOATE) 100 MG/ML injection Inject 200 mg into the muscle every 28 (twenty-eight) days. Last injection 06/09/15     . hydrOXYzine (VISTARIL) 25 MG capsule Take 25 mg by mouth at bedtime.     [name] ibuprofen (ADVIL,MOTRIN) 400 MG tablet Take 400 mg by mouth every 8 (eight) hours as needed for moderate pain.    [name] insulin aspart (NOVOLOG) 100 UNIT/ML injection Inject 0-20 Units into the skin 3 (three) times daily with meals.    . insulin detemir (LEVEMIR) 100 UNIT/ML injection Inject 0.33 mLs (33 Units total) into the skin at bedtime. 10 mL 11  . Magnesium Oxide 400 MG CAPS Take 1 capsule (400 mg total) by mouth every morning. 60 each 0  . polyethylene glycol (MIRALAX / GLYCOLAX) packet Take 17 g by mouth daily as needed for mild constipation.     . QUEtiapine (SEROQUEL) 200 MG tablet Take 200 mg by mouth 2 (two) times daily. Take 1 tablet (200 mg) by mouth with supper and at bedtime daily    . raltegravir (ISENTRESS) 400 MG tablet Take 400 mg by mouth 2 (two) times daily.    . trihexyphenidyl (ARTANE) 2 MG tablet Take 2 mg by mouth 2 (two) times daily with a meal.    . valACYclovir (VALTREX) 1000 MG tablet Take 1,000 mg by mouth daily.     [name] zolpidem (AMBIEN) 10 MG tablet Take 5 mg by mouth at bedtime.      No current facility-administered medications for this visit.      Physical Exam  Blood pressure 138/74, pulse 72, height 5\' 11"  (1.803 m), weight 222 lb (100.7 kg).  Constitutional: overall normal hygiene, normal nutrition, well developed, normal grooming, normal body habitus. Assistive device:none  Musculoskeletal: gait and station Limp none, muscle tone and strength are normal, no tremors or atrophy is present.  .  Neurological: coordination overall normal.  Deep tendon reflex/nerve stretch intact.   Sensation normal.  Cranial nerves II-XII intact.   Skin:   Normal overall no scars, lesions, ulcers or rashes. No psoriasis.  Psychiatric: Alert and oriented x 3.  Recent memory intact, remote memory unclear.  Normal mood and affect. Well groomed.  Good eye contact.  Cardiovascular: overall no swelling, no varicosities, no edema bilaterally, normal temperatures of the legs and arms, no clubbing, cyanosis and good capillary refill.  Lymphatic: palpation is normal.  Right shoulder with very limited ROM secondary to pain.  She does not want to move it.  NV intact.  Grips normal.  All other systems reviewed and are negative  The patient has been educated about the nature of the problem(s) and counseled on treatment options.  The patient appeared to understand what I have discussed and is in agreement with it.  Encounter Diagnoses  Name Primary?  . Pain in joint of right shoulder Yes  . Cigarette nicotine dependence without complication   . Personality disorder ([place])    PROCEDURE NOTE:  The patient request injection, verbal consent was obtained.  The right shoulder was prepped appropriately after time out was performed.   Sterile technique was observed and injection of 1 cc of Depo-Medrol 40 mg with several cc's of plain xylocaine. Anesthesia was provided by ethyl chloride and a 20-gauge needle was used to inject the shoulder area. A posterior approach was used.  The injection was tolerated well.  A band aid dressing was applied.  The patient was advised to apply ice later today and tomorrow to the injection sight as needed.    PLAN Call if any problems.  Precautions discussed.  Continue current medications.   Return to clinic 1 month   Forms for rest home completed.  Exercise sheet printed.  Electronically Signed Sanjuana Kava, MD 8/28/20192:13 PM

## 2018-07-22 NOTE — Patient Instructions (Signed)
Shoulder Exercises Ask your health care provider which exercises are safe for you. Do exercises exactly as told by your health care provider and adjust them as directed. It is normal to feel mild stretching, pulling, tightness, or discomfort as you do these exercises, but you should stop right away if you feel sudden pain or your pain gets worse.Do not begin these exercises until told by your health care provider. RANGE OF MOTION EXERCISES These exercises warm up your muscles and joints and improve the movement and flexibility of your shoulder. These exercises also help to relieve pain, numbness, and tingling. These exercises involve stretching your injured shoulder directly. Exercise A: Pendulum  1. Stand near a wall or a surface that you can hold onto for balance. 2. Bend at the waist and let your left / right arm hang straight down. Use your other arm to support you. Keep your back straight and do not lock your knees. 3. Relax your left / right arm and shoulder muscles, and move your hips and your trunk so your left / right arm swings freely. Your arm should swing because of the motion of your body, not because you are using your arm or shoulder muscles. 4. Keep moving your body so your arm swings in the following directions, as told by your health care provider: ? Side to side. ? Forward and backward. ? In clockwise and counterclockwise circles. 5. Continue each motion for __________ seconds, or for as long as told by your health care provider. 6. Slowly return to the starting position. Repeat __________ times. Complete this exercise __________ times a day. Exercise B:Flexion, Standing  1. Stand and hold a broomstick, a cane, or a similar object. Place your hands a little more than shoulder-width apart on the object. Your left / right hand should be palm-up, and your other hand should be palm-down. 2. Keep your elbow straight and keep your shoulder muscles relaxed. Push the stick down with  your healthy arm to raise your left / right arm in front of your body, and then over your head until you feel a stretch in your shoulder. ? Avoid shrugging your shoulder while you raise your arm. Keep your shoulder blade tucked down toward the middle of your back. 3. Hold for __________ seconds. 4. Slowly return to the starting position. Repeat __________ times. Complete this exercise __________ times a day. Exercise C: Abduction, Standing 1. Stand and hold a broomstick, a cane, or a similar object. Place your hands a little more than shoulder-width apart on the object. Your left / right hand should be palm-up, and your other hand should be palm-down. 2. While keeping your elbow straight and your shoulder muscles relaxed, push the stick across your body toward your left / right side. Raise your left / right arm to the side of your body and then over your head until you feel a stretch in your shoulder. ? Do not raise your arm above shoulder height, unless your health care provider tells you to do that. ? Avoid shrugging your shoulder while you raise your arm. Keep your shoulder blade tucked down toward the middle of your back. 3. Hold for __________ seconds. 4. Slowly return to the starting position. Repeat __________ times. Complete this exercise __________ times a day. Exercise D:Internal Rotation  1. Place your left / right hand behind your back, palm-up. 2. Use your other hand to dangle an exercise band, a towel, or a similar object over your shoulder. Grasp the band with   your left / right hand so you are holding onto both ends. 3. Gently pull up on the band until you feel a stretch in the front of your left / right shoulder. ? Avoid shrugging your shoulder while you raise your arm. Keep your shoulder blade tucked down toward the middle of your back. 4. Hold for __________ seconds. 5. Release the stretch by letting go of the band and lowering your hands. Repeat __________ times. Complete  this exercise __________ times a day. STRETCHING EXERCISES These exercises warm up your muscles and joints and improve the movement and flexibility of your shoulder. These exercises also help to relieve pain, numbness, and tingling. These exercises are done using your healthy shoulder to help stretch the muscles of your injured shoulder. Exercise E: Corner Stretch (External Rotation and Abduction)  1. Stand in a doorway with one of your feet slightly in front of the other. This is called a staggered stance. If you cannot reach your forearms to the door frame, stand facing a corner of a room. 2. Choose one of the following positions as told by your health care provider: ? Place your hands and forearms on the door frame above your head. ? Place your hands and forearms on the door frame at the height of your head. ? Place your hands on the door frame at the height of your elbows. 3. Slowly move your weight onto your front foot until you feel a stretch across your chest and in the front of your shoulders. Keep your head and chest upright and keep your abdominal muscles tight. 4. Hold for __________ seconds. 5. To release the stretch, shift your weight to your back foot. Repeat __________ times. Complete this stretch __________ times a day. Exercise F:Extension, Standing 1. Stand and hold a broomstick, a cane, or a similar object behind your back. ? Your hands should be a little wider than shoulder-width apart. ? Your palms should face away from your back. 2. Keeping your elbows straight and keeping your shoulder muscles relaxed, move the stick away from your body until you feel a stretch in your shoulder. ? Avoid shrugging your shoulders while you move the stick. Keep your shoulder blade tucked down toward the middle of your back. 3. Hold for __________ seconds. 4. Slowly return to the starting position. Repeat __________ times. Complete this exercise __________ times a day. STRENGTHENING  EXERCISES These exercises build strength and endurance in your shoulder. Endurance is the ability to use your muscles for a long time, even after they get tired. Exercise G:External Rotation  1. Sit in a stable chair without armrests. 2. Secure an exercise band at elbow height on your left / right side. 3. Place a soft object, such as a folded towel or a small pillow, between your left / right upper arm and your body to move your elbow a few inches away (about 10 cm) from your side. 4. Hold the end of the band so it is tight and there is no slack. 5. Keeping your elbow pressed against the soft object, move your left / right forearm out, away from your abdomen. Keep your body steady so only your forearm moves. 6. Hold for __________ seconds. 7. Slowly return to the starting position. Repeat __________ times. Complete this exercise __________ times a day. Exercise H:Shoulder Abduction  1. Sit in a stable chair without armrests, or stand. 2. Hold a __________ weight in your left / right hand, or hold an exercise band with both hands.   3. Start with your arms straight down and your left / right palm facing in, toward your body. 4. Slowly lift your left / right hand out to your side. Do not lift your hand above shoulder height unless your health care provider tells you that this is safe. ? Keep your arms straight. ? Avoid shrugging your shoulder while you do this movement. Keep your shoulder blade tucked down toward the middle of your back. 5. Hold for __________ seconds. 6. Slowly lower your arm, and return to the starting position. Repeat __________ times. Complete this exercise __________ times a day. Exercise I:Shoulder Extension 1. Sit in a stable chair without armrests, or stand. 2. Secure an exercise band to a stable object in front of you where it is at shoulder height. 3. Hold one end of the exercise band in each hand. Your palms should face each other. 4. Straighten your elbows and  lift your hands up to shoulder height. 5. Step back, away from the secured end of the exercise band, until the band is tight and there is no slack. 6. Squeeze your shoulder blades together as you pull your hands down to the sides of your thighs. Stop when your hands are straight down by your sides. Do not let your hands go behind your body. 7. Hold for __________ seconds. 8. Slowly return to the starting position. Repeat __________ times. Complete this exercise __________ times a day. Exercise J:Standing Shoulder Row 1. Sit in a stable chair without armrests, or stand. 2. Secure an exercise band to a stable object in front of you so it is at waist height. 3. Hold one end of the exercise band in each hand. Your palms should be in a thumbs-up position. 4. Bend each of your elbows to an "L" shape (about 90 degrees) and keep your upper arms at your sides. 5. Step back until the band is tight and there is no slack. 6. Slowly pull your elbows back behind you. 7. Hold for __________ seconds. 8. Slowly return to the starting position. Repeat __________ times. Complete this exercise __________ times a day. Exercise K:Shoulder Press-Ups  1. Sit in a stable chair that has armrests. Sit upright, with your feet flat on the floor. 2. Put your hands on the armrests so your elbows are bent and your fingers are pointing forward. Your hands should be about even with the sides of your body. 3. Push down on the armrests and use your arms to lift yourself off of the chair. Straighten your elbows and lift yourself up as much as you comfortably can. ? Move your shoulder blades down, and avoid letting your shoulders move up toward your ears. ? Keep your feet on the ground. As you get stronger, your feet should support less of your body weight as you lift yourself up. 4. Hold for __________ seconds. 5. Slowly lower yourself back into the chair. Repeat __________ times. Complete this exercise __________ times a  day. Exercise L: Wall Push-Ups  1. Stand so you are facing a stable wall. Your feet should be about one arm-length away from the wall. 2. Lean forward and place your palms on the wall at shoulder height. 3. Keep your feet flat on the floor as you bend your elbows and lean forward toward the wall. 4. Hold for __________ seconds. 5. Straighten your elbows to push yourself back to the starting position. Repeat __________ times. Complete this exercise __________ times a day. This information is not intended to replace advice   given to you by your health care provider. Make sure you discuss any questions you have with your health care provider. Document Released: 09/25/2005 Document Revised: 08/05/2016 Document Reviewed: 07/23/2015 [employer] Interactive Patient Education  2018 [employer]  

## 2018-08-20 ENCOUNTER — Ambulatory Visit: Payer: Medicaid Other | Admitting: Orthopaedic Surgery

## 2018-08-20 ENCOUNTER — Encounter: Payer: Self-pay | Admitting: Orthopaedic Surgery

## 2018-08-20 VITALS — BP 140/90 | Temp 101.0°F | Ht 71.0 in | Wt 220.0 lb

## 2018-08-20 DIAGNOSIS — M25511 Pain in right shoulder: Secondary | ICD-10-CM | POA: Diagnosis not present

## 2018-08-20 DIAGNOSIS — F17210 Nicotine dependence, cigarettes, uncomplicated: Secondary | ICD-10-CM | POA: Diagnosis not present

## 2018-08-20 NOTE — Progress Notes (Signed)
Patient Karina Clarke, female DOB:1961-08-06, 57 y.o. YDX:[id number]  Chief Complaint  Patient presents with  . Shoulder Pain  . Knee Pain    HPI  Karina Clarke is a 57 y.o. female who has had right shoulder pain.  She has done much better.  She still has some pain more at night but overall she is much better.  She does not want an injection.  She has no numbness.   Body mass index is 30.68 kg/m.  ROS  Review of Systems  Constitutional: Positive for activity change.  HENT: Negative for congestion.   Respiratory: Negative for cough and shortness of breath.   Cardiovascular: Negative for chest pain and leg swelling.  Endocrine: Positive for cold intolerance.  Musculoskeletal: Positive for arthralgias, gait problem and joint swelling.  Allergic/Immunologic: Positive for environmental allergies.  Psychiatric/Behavioral: The patient is nervous/anxious.   All other systems reviewed and are negative.   All other systems reviewed and are negative.  The following is a summary of the past history medically, past history surgically, known current medicines, social history and family history.  This information is gathered electronically by the computer from prior information and documentation.  I review this each visit and have found including this information at this point in the chart is beneficial and informative.    Past Medical History:  Diagnosis Date  . 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 Bloating    per sister  . Chronic right hip pain    sister states circulation problem in hip  . Family history of adverse reaction to anesthesia    sister has hx. of post-op N/V  . High cholesterol   . HIV (human immunodeficiency virus infection) ([place])   . Hypertension   . Limp   . Mild intellectual disability   . No natural teeth    does not wear her dentures  . Paranoid schizophrenia ([place])   . Personality disorder ([place])   . Schizophrenia [facility])     Past  Surgical History:  Procedure Laterality Date  . BREAST LUMPECTOMY WITH NEEDLE LOCALIZATION Left 07/28/2015   Procedure: BREAST LUMPECTOMY WITH NEEDLE LOCALIZATION;  Surgeon: Autumn Messing III, MD;  Location: [facility];  Service: General;  Laterality: Left;  . COLONOSCOPY WITH PROPOFOL N/A 06/13/2015   SLF: 1. the left colon is redundant 2. the examination was otherwise normal 3. small internal hemorrhoids 4. moderate sized external hemorrhoids  . HEMORRHOID SURGERY    . UMBILICAL HERNIA REPAIR      Family History  Problem Relation Age of Onset  . Anesthesia problems Sister        post-op N/V  . Diabetes Father     Social History Social History   Tobacco Use  . Smoking status: Current Every Day Smoker    Packs/day: 1.00    Years: 18.00    Pack years: 18.00    Types: Cigarettes  . Smokeless tobacco: Never Used  Substance Use Topics  . Alcohol use: No    Alcohol/week: 0.0 standard drinks  . Drug use: No    Allergies  Allergen Reactions  . Asa [Aspirin] Nausea And Vomiting    Current Outpatient Medications  Medication Sig Dispense Refill  . abacavir-lamiVUDine (EPZICOM) 600-300 MG per tablet Take 1 tablet by mouth daily.    [name] acetaminophen (TYLENOL) 650 MG CR tablet Take 650 mg by mouth every 8 (eight) hours as needed for pain.    [name] acetaminophen (TYLENOL) 650 MG suppository Place 650  mg rectally every 6 (six) hours as needed for moderate pain.    [name] albuterol (PROVENTIL HFA;VENTOLIN HFA) 108 (90 Base) MCG/ACT inhaler Inhale 1-2 puffs into the lungs 2 (two) times daily as needed for wheezing or shortness of breath.    [name] atazanavir (REYATAZ) 200 MG capsule Take 400 mg by mouth daily.     [name] atorvastatin (LIPITOR) 10 MG tablet Take 10 mg by mouth 2 (two) times daily.    . divalproex (DEPAKOTE) 500 MG DR tablet Take 500 mg by mouth 2 (two) times daily. Take 1 tablet (500 mg) by mouth at 4pm and 8pm daily    . haloperidol (HALDOL) 10 MG tablet Take 20 mg by mouth 3  (three) times daily.     . haloperidol decanoate (HALDOL DECANOATE) 100 MG/ML injection Inject 200 mg into the muscle every 28 (twenty-eight) days. Last injection 06/09/15     . HYDROcodone-acetaminophen (NORCO/VICODIN) 5-325 MG tablet One tablet every four hours as needed for acute pain.  Limit of five days per [place] statue. 30 tablet 0  . hydrOXYzine (VISTARIL) 25 MG capsule Take 25 mg by mouth at bedtime.     [name] ibuprofen (ADVIL,MOTRIN) 400 MG tablet Take 400 mg by mouth every 8 (eight) hours as needed for moderate pain.    [name] insulin aspart (NOVOLOG) 100 UNIT/ML injection Inject 0-20 Units into the skin 3 (three) times daily with meals.    . insulin detemir (LEVEMIR) 100 UNIT/ML injection Inject 0.33 mLs (33 Units total) into the skin at bedtime. 10 mL 11  . Magnesium Oxide 400 MG CAPS Take 1 capsule (400 mg total) by mouth every morning. 60 each 0  . polyethylene glycol (MIRALAX / GLYCOLAX) packet Take 17 g by mouth daily as needed for mild constipation.     . QUEtiapine (SEROQUEL) 200 MG tablet Take 200 mg by mouth 2 (two) times daily. Take 1 tablet (200 mg) by mouth with supper and at bedtime daily    . raltegravir (ISENTRESS) 400 MG tablet Take 400 mg by mouth 2 (two) times daily.    . trihexyphenidyl (ARTANE) 2 MG tablet Take 2 mg by mouth 2 (two) times daily with a meal.    . valACYclovir (VALTREX) 1000 MG tablet Take 1,000 mg by mouth daily.     [name] zolpidem (AMBIEN) 10 MG tablet Take 5 mg by mouth at bedtime.      No current facility-administered medications for this visit.      Physical Exam  Blood pressure 140/90, temperature (!) 101 F (38.3 C), height 5\' 11"  (1.803 m), weight 220 lb (99.8 kg).  Constitutional: overall normal hygiene, normal nutrition, well developed, normal grooming, normal body habitus. Assistive device:none  Musculoskeletal: gait and station Limp none, muscle tone and strength are normal, no tremors or atrophy is present.  .  Neurological: coordination  overall normal.  Deep tendon reflex/nerve stretch intact.  Sensation normal.  Cranial nerves II-XII intact.   Skin:   Normal overall no scars, lesions, ulcers or rashes. No psoriasis.  Psychiatric: Alert and oriented x 3.  Recent memory intact, remote memory unclear.  Normal mood and affect. Well groomed.  Good eye contact.  Cardiovascular: overall no swelling, no varicosities, no edema bilaterally, normal temperatures of the legs and arms, no clubbing, cyanosis and good capillary refill.  Lymphatic: palpation is normal.  Right shoulder has near full ROM with pain only in the extremes.  NV intact.  All other systems reviewed and are negative  The patient has been educated about the nature of the problem(s) and counseled on treatment options.  The patient appeared to understand what I have discussed and is in agreement with it.  Encounter Diagnoses  Name Primary?  . Pain in joint of right shoulder Yes  . Cigarette nicotine dependence without complication     PLAN Call if any problems.  Precautions discussed.  Continue current medications.   Return to clinic prn   Electronically [place], MD 9/26/20192:24 PM

## 2018-08-27 ENCOUNTER — Encounter: Payer: Self-pay | Admitting: Orthopaedic Surgery

## 2018-08-27 ENCOUNTER — Ambulatory Visit: Payer: Medicaid Other | Admitting: Orthopaedic Surgery

## 2018-08-27 VITALS — Temp 96.6°F | Ht 71.0 in | Wt 220.0 lb

## 2018-08-27 DIAGNOSIS — F609 Personality disorder, unspecified: Secondary | ICD-10-CM

## 2018-08-27 DIAGNOSIS — M25511 Pain in right shoulder: Secondary | ICD-10-CM

## 2018-08-27 DIAGNOSIS — F17210 Nicotine dependence, cigarettes, uncomplicated: Secondary | ICD-10-CM

## 2018-08-27 NOTE — Patient Instructions (Signed)
Steps to Quit Smoking Smoking tobacco can be bad for your health. It can also affect almost every organ in your body. Smoking puts you and people around you at risk for many serious long-lasting (chronic) diseases. Quitting smoking is hard, but it is one of the best things that you can do for your health. It is never too late to quit. What are the benefits of quitting smoking? When you quit smoking, you lower your risk for getting serious diseases and conditions. They can include:  Lung cancer or lung disease.  Heart disease.  Stroke.  Heart attack.  Not being able to have children (infertility).  Weak bones (osteoporosis) and broken bones (fractures).  If you have coughing, wheezing, and shortness of breath, those symptoms may get better when you quit. You may also get sick less often. If you are pregnant, quitting smoking can help to lower your chances of having a baby of low birth weight. What can I do to help me quit smoking? Talk with your doctor about what can help you quit smoking. Some things you can do (strategies) include:  Quitting smoking totally, instead of slowly cutting back how much you smoke over a period of time.  Going to in-person counseling. You are more likely to quit if you go to many counseling sessions.  Using resources and support systems, such as: ? Online chats with a counselor. ? Phone quitlines. ? Printed self-help materials. ? Support groups or group counseling. ? Text messaging programs. ? Mobile phone apps or applications.  Taking medicines. Some of these medicines may have nicotine in them. If you are pregnant or breastfeeding, do not take any medicines to quit smoking unless your doctor says it is okay. Talk with your doctor about counseling or other things that can help you.  Talk with your doctor about using more than one strategy at the same time, such as taking medicines while you are also going to in-person counseling. This can help make  quitting easier. What things can I do to make it easier to quit? Quitting smoking might feel very hard at first, but there is a lot that you can do to make it easier. Take these steps:  Talk to your family and friends. Ask them to support and encourage you.  Call phone quitlines, reach out to support groups, or work with a counselor.  Ask people who smoke to not smoke around you.  Avoid places that make you want (trigger) to smoke, such as: ? Bars. ? Parties. ? Smoke-break areas at work.  Spend time with people who do not smoke.  Lower the stress in your life. Stress can make you want to smoke. Try these things to help your stress: ? Getting regular exercise. ? Deep-breathing exercises. ? Yoga. ? Meditating. ? Doing a body scan. To do this, close your eyes, focus on one area of your body at a time from head to toe, and notice which parts of your body are tense. Try to relax the muscles in those areas.  Download or buy apps on your mobile phone or tablet that can help you stick to your quit plan. There are many free apps, such as QuitGuide from the CDC (Centers for Disease Control and Prevention). You can find more support from smokefree.gov and other websites.  This information is not intended to replace advice given to you by your health care provider. Make sure you discuss any questions you have with your health care provider. Document Released: 09/07/2009 Document   Revised: 07/09/2016 Document Reviewed: 03/28/2015 Elsevier Interactive Patient Education  2018 Elsevier Inc.  

## 2018-08-27 NOTE — Progress Notes (Signed)
CC:  I have pain of my right knee. I would like an injection.  The patient has chronic pain of the right knee.  There is no recent trauma.  There is no redness.  Injections in the past have helped.  The knee has no redness, has an effusion and crepitus present.  ROM of the right knee is 0-110.  Impression:  Chronic knee pain right  Return: 6 weeks  PROCEDURE NOTE:  The patient requests injections of the right knee , verbal consent was obtained.  The right knee was prepped appropriately after time out was performed.   Sterile technique was observed and injection of 1 cc of Depo-Medrol 40 mg with several cc's of plain xylocaine. Anesthesia was provided by ethyl chloride and a 20-gauge needle was used to inject the knee area. The injection was tolerated well.  A band aid dressing was applied.  The patient was advised to apply ice later today and tomorrow to the injection sight as needed.  Electronically Signed Sanjuana Kava, MD 10/3/20198:56 AM

## 2018-09-15 ENCOUNTER — Ambulatory Visit: Payer: Medicaid Other | Admitting: Podiatry

## 2018-09-28 ENCOUNTER — Ambulatory Visit: Payer: Medicaid Other | Admitting: Podiatry

## 2018-10-08 ENCOUNTER — Ambulatory Visit (INDEPENDENT_AMBULATORY_CARE_PROVIDER_SITE_OTHER): Payer: Medicaid Other | Admitting: Orthopaedic Surgery

## 2018-10-08 ENCOUNTER — Encounter: Payer: Self-pay | Admitting: Orthopaedic Surgery

## 2018-10-08 VITALS — BP 116/77 | HR 64 | Ht 71.0 in | Wt 220.0 lb

## 2018-10-08 DIAGNOSIS — M25511 Pain in right shoulder: Secondary | ICD-10-CM

## 2018-10-08 DIAGNOSIS — F17210 Nicotine dependence, cigarettes, uncomplicated: Secondary | ICD-10-CM

## 2018-10-08 NOTE — Progress Notes (Signed)
CC:  I have pain of my right knee. I would like an injection.  The patient has chronic pain of the right knee.  There is no recent trauma.  There is no redness.  Injections in the past have helped.  The knee has no redness, has an effusion and crepitus present.  ROM of the right knee is 0-110.  Impression:  Chronic knee pain right  Return: 2 months  PROCEDURE NOTE:  The patient requests injections of the right knee , verbal consent was obtained.  The right knee was prepped appropriately after time out was performed.   Sterile technique was observed and injection of 1 cc of Depo-Medrol 40 mg with several cc's of plain xylocaine. Anesthesia was provided by ethyl chloride and a 20-gauge needle was used to inject the knee area. The injection was tolerated well.  A band aid dressing was applied.  The patient was advised to apply ice later today and tomorrow to the injection sight as needed.  Electronically Signed Sanjuana Kava, MD 11/14/20192:13 PM

## 2018-12-09 ENCOUNTER — Ambulatory Visit: Payer: Medicaid Other | Admitting: Orthopaedic Surgery

## 2019-01-28 ENCOUNTER — Ambulatory Visit: Payer: Medicaid Other | Admitting: Orthopaedic Surgery

## 2019-02-04 ENCOUNTER — Other Ambulatory Visit (INDEPENDENT_AMBULATORY_CARE_PROVIDER_SITE_OTHER): Payer: Self-pay | Admitting: Radiology

## 2019-02-19 ENCOUNTER — Ambulatory Visit: Payer: Medicaid Other | Admitting: Podiatry

## 2019-03-26 ENCOUNTER — Ambulatory Visit: Payer: Medicaid Other | Admitting: Podiatry

## 2019-07-13 ENCOUNTER — Ambulatory Visit: Payer: Medicaid Other | Admitting: Orthopaedic Surgery

## 2019-07-13 ENCOUNTER — Encounter: Payer: Self-pay | Admitting: Orthopaedic Surgery

## 2019-07-13 ENCOUNTER — Other Ambulatory Visit: Payer: Self-pay

## 2019-07-13 VITALS — BP 132/86 | HR 91 | Temp 97.0°F | Ht 71.0 in | Wt 220.0 lb

## 2019-07-13 DIAGNOSIS — M25561 Pain in right knee: Secondary | ICD-10-CM | POA: Diagnosis not present

## 2019-07-13 DIAGNOSIS — G8929 Other chronic pain: Secondary | ICD-10-CM | POA: Diagnosis not present

## 2019-07-13 DIAGNOSIS — M25562 Pain in left knee: Secondary | ICD-10-CM | POA: Diagnosis not present

## 2019-07-13 NOTE — Progress Notes (Signed)
Patient Karina Clarke, female DOB:09/24/1961, 58 y.o. ZDG:[id number]  Chief Complaint  Patient presents with  . Knee Pain    Chronic pain of right knee.    HPI  Karina Clarke is a 58 y.o. female who has pain of the right knee.  She has swelling and popping.  She has no giving way.  She has developed more swelling and pain of the left knee, a lot of swelling.  She denies any trauma.  She is a resident at local rest home.   Body mass index is 30.68 kg/m.  ROS  Review of Systems  Constitutional: Positive for activity change.  HENT: Negative for congestion.   Respiratory: Negative for cough and shortness of breath.   Cardiovascular: Negative for chest pain and leg swelling.  Endocrine: Positive for cold intolerance.  Musculoskeletal: Positive for arthralgias, gait problem and joint swelling.  Allergic/Immunologic: Positive for environmental allergies.  Psychiatric/Behavioral: The patient is nervous/anxious.   All other systems reviewed and are negative.   All other systems reviewed and are negative.  The following is a summary of the past history medically, past history surgically, known current medicines, social history and family history.  This information is gathered electronically by the computer from prior information and documentation.  I review this each visit and have found including this information at this point in the chart is beneficial and informative.    Past Medical History:  Diagnosis Date  . 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 Bloating    per sister  . Chronic right hip pain    sister states circulation problem in hip  . Family history of adverse reaction to anesthesia    sister has hx. of post-op N/V  . High cholesterol   . HIV (human immunodeficiency virus infection) ([place])   . Hypertension   . Limp   . Mild intellectual disability   . No natural teeth    does not wear her dentures  . Paranoid schizophrenia ([place])   . Personality  disorder ([place])   . Schizophrenia [facility])     Past Surgical History:  Procedure Laterality Date  . BREAST LUMPECTOMY WITH NEEDLE LOCALIZATION Left 07/28/2015   Procedure: BREAST LUMPECTOMY WITH NEEDLE LOCALIZATION;  Surgeon: Autumn Messing III, MD;  Location: [facility];  Service: General;  Laterality: Left;  . COLONOSCOPY WITH PROPOFOL N/A 06/13/2015   SLF: 1. the left colon is redundant 2. the examination was otherwise normal 3. small internal hemorrhoids 4. moderate sized external hemorrhoids  . HEMORRHOID SURGERY    . UMBILICAL HERNIA REPAIR      Family History  Problem Relation Age of Onset  . Anesthesia problems Sister        post-op N/V  . Diabetes Father     Social History Social History   Tobacco Use  . Smoking status: Current Every Day Smoker    Packs/day: 1.00    Years: 18.00    Pack years: 18.00    Types: Cigarettes  . Smokeless tobacco: Never Used  Substance Use Topics  . Alcohol use: No    Alcohol/week: 0.0 standard drinks  . Drug use: No    Allergies  Allergen Reactions  . Asa [Aspirin] Nausea And Vomiting    Current Outpatient Medications  Medication Sig Dispense Refill  . abacavir-lamiVUDine (EPZICOM) 600-300 MG per tablet Take 1 tablet by mouth daily.    [name] acetaminophen (TYLENOL) 650 MG CR tablet Take 650 mg by mouth every 8 (eight) hours as  needed for pain.    [name] acetaminophen (TYLENOL) 650 MG suppository Place 650 mg rectally every 6 (six) hours as needed for moderate pain.    [name] albuterol (PROVENTIL HFA;VENTOLIN HFA) 108 (90 Base) MCG/ACT inhaler Inhale 1-2 puffs into the lungs 2 (two) times daily as needed for wheezing or shortness of breath.    [name] atazanavir (REYATAZ) 200 MG capsule Take 400 mg by mouth daily.     [name] atorvastatin (LIPITOR) 10 MG tablet Take 10 mg by mouth 2 (two) times daily.    . divalproex (DEPAKOTE) 500 MG DR tablet Take 500 mg by mouth 2 (two) times daily. Take 1 tablet (500 mg) by mouth at 4pm and 8pm daily    .  haloperidol (HALDOL) 10 MG tablet Take 20 mg by mouth 3 (three) times daily.     . haloperidol decanoate (HALDOL DECANOATE) 100 MG/ML injection Inject 200 mg into the muscle every 28 (twenty-eight) days. Last injection 06/09/15     . HYDROcodone-acetaminophen (NORCO/VICODIN) 5-325 MG tablet One tablet every four hours as needed for acute pain.  Limit of five days per [place] statue. 30 tablet 0  . hydrOXYzine (VISTARIL) 25 MG capsule Take 25 mg by mouth at bedtime.     [name] ibuprofen (ADVIL,MOTRIN) 400 MG tablet Take 400 mg by mouth every 8 (eight) hours as needed for moderate pain.    [name] insulin aspart (NOVOLOG) 100 UNIT/ML injection Inject 0-20 Units into the skin 3 (three) times daily with meals.    . insulin detemir (LEVEMIR) 100 UNIT/ML injection Inject 0.33 mLs (33 Units total) into the skin at bedtime. 10 mL 11  . Magnesium Oxide 400 MG CAPS Take 1 capsule (400 mg total) by mouth every morning. 60 each 0  . polyethylene glycol (MIRALAX / GLYCOLAX) packet Take 17 g by mouth daily as needed for mild constipation.     . QUEtiapine (SEROQUEL) 200 MG tablet Take 200 mg by mouth 2 (two) times daily. Take 1 tablet (200 mg) by mouth with supper and at bedtime daily    . raltegravir (ISENTRESS) 400 MG tablet Take 400 mg by mouth 2 (two) times daily.    . trihexyphenidyl (ARTANE) 2 MG tablet Take 2 mg by mouth 2 (two) times daily with a meal.    . valACYclovir (VALTREX) 1000 MG tablet Take 1,000 mg by mouth daily.     [name] zolpidem (AMBIEN) 10 MG tablet Take 5 mg by mouth at bedtime.      No current facility-administered medications for this visit.      Physical Exam  Blood pressure 132/86, pulse 91, temperature (!) 97 F (36.1 C), height 5\' 11"  (1.803 m), weight 220 lb (99.8 kg).  Constitutional: overall normal hygiene, normal nutrition, well developed, normal grooming, normal body habitus. Assistive device:crutches  Musculoskeletal: gait and station Limp left, muscle tone and strength are normal,  no tremors or atrophy is present.  .  Neurological: coordination overall normal.  Deep tendon reflex/nerve stretch intact.  Sensation normal.  Cranial nerves II-XII intact.   Skin:   Normal overall no scars, lesions, ulcers or rashes. No psoriasis.  Psychiatric: Alert and oriented x 3.  Recent memory intact, remote memory unclear.  Normal mood and affect. Well groomed.  Good eye contact.  Cardiovascular: overall no swelling, no varicosities, no edema bilaterally, normal temperatures of the legs and arms, no clubbing, cyanosis and good capillary refill.  Lymphatic: palpation is normal.  Left knee has large effusion, crepitus, ROM 0 to  90 with pain, limp left.  Right knee has small effusion, ROM 0 to 110, crepitus, stable, NV intact.  All other systems reviewed and are negative   The patient has been educated about the nature of the problem(s) and counseled on treatment options.  The patient appeared to understand what I have discussed and is in agreement with it.  Encounter Diagnoses  Name Primary?  . Chronic pain of right knee Yes  . Chronic pain of left knee     PROCEDURE NOTE:  The patient request injection, verbal consent was obtained.  The left knee was prepped appropriately after time out was performed.   Sterile technique was observed and anesthesia was provided by ethyl chloride and a 20-gauge needle was used to inject the knee area.  A 16-gauge needle was then used to aspirate the knee.  Color of fluid aspirated was slightly redish  Total cc's aspirated was 55.    Injection of 1 cc of Depo-Medrol 40 mg with several cc's of plain xylocaine was then performed.  A band aid dressing was applied.  The patient was advised to apply ice later today and tomorrow to the injection sight as needed.  PROCEDURE NOTE:  The patient requests injections of the right knee , verbal consent was obtained.  The right knee was prepped appropriately after time out was performed.    Sterile technique was observed and injection of 1 cc of Depo-Medrol 40 mg with several cc's of plain xylocaine. Anesthesia was provided by ethyl chloride and a 20-gauge needle was used to inject the knee area. The injection was tolerated well.  A band aid dressing was applied.  The patient was advised to apply ice later today and tomorrow to the injection sight as needed.  PLAN Call if any problems.  Precautions discussed.  Continue current medications.   Return to clinic 6 weeks   Forms completed for rest home.  Electronically Signed Sanjuana Kava, MD 8/18/20209:39 AM

## 2019-08-12 IMAGING — CR DG CHEST 1V PORT
1 series · 1 of 1 positions shown · non-contrast
Comparison: June 22, 2013 and December 26, 2016

CLINICAL DATA: Cough and chest pain.

EXAM:
PORTABLE CHEST 1 VIEW

[portable]
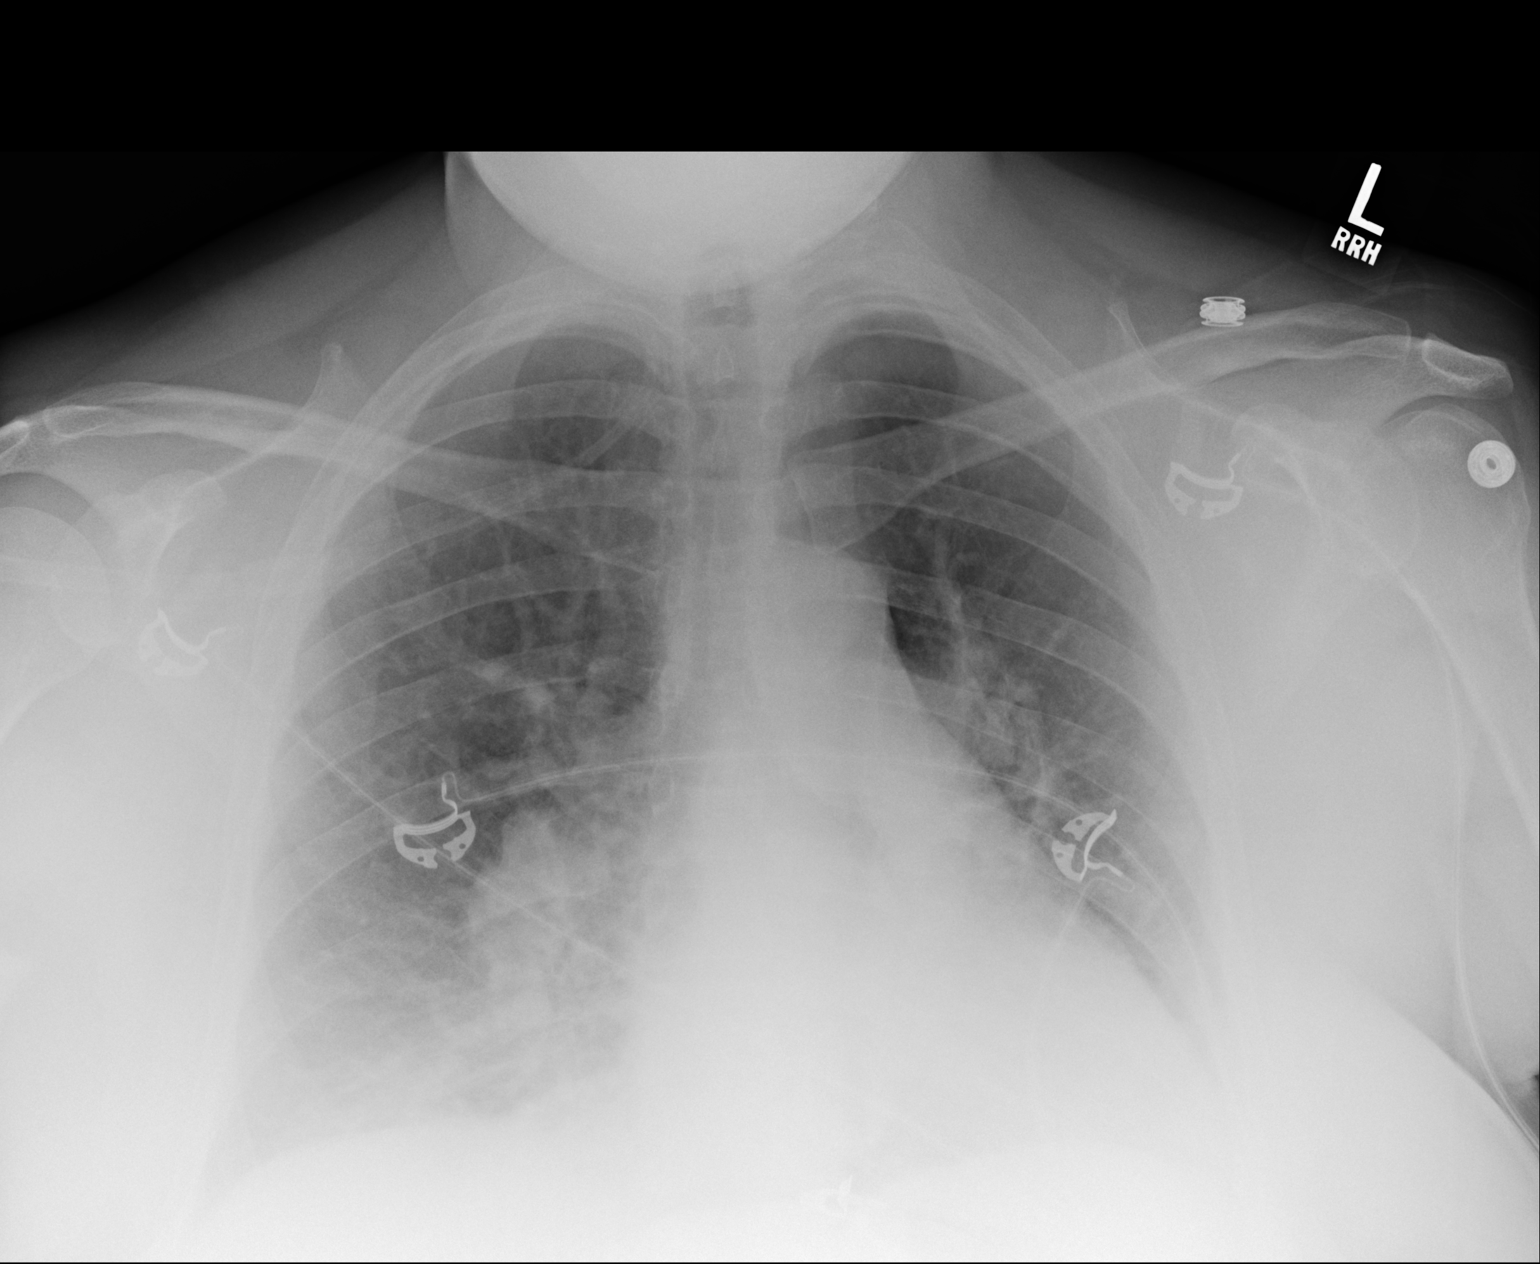

[1 of 1 positions shown; findings below may reference images not displayed]

FINDINGS: No pneumothorax. Stable mild cardiomegaly. Mild prominence the right
hilum is likely due to patient rotation. This finding is not
significantly changed since 0855. No nodules or masses. There is
increased opacity in the right base. No overt edema.
IMPRESSION: Increasing opacity in the right base. This could be better evaluated
with a PA and lateral chest x-ray. Developing pneumonia is not
excluded.

## 2019-08-24 ENCOUNTER — Ambulatory Visit: Payer: Medicaid Other | Admitting: Orthopaedic Surgery

## 2019-08-31 ENCOUNTER — Encounter: Payer: Self-pay | Admitting: Orthopaedic Surgery

## 2019-08-31 ENCOUNTER — Ambulatory Visit: Payer: Medicaid Other | Admitting: Orthopaedic Surgery

## 2019-10-06 ENCOUNTER — Telehealth: Payer: Self-pay | Admitting: Orthopaedic Surgery

## 2019-10-06 NOTE — Telephone Encounter (Signed)
Call received from facility administrator, Levie Heritage of [facility], requesting to re-schedule patient's appointment for bilateral knee pain. States, regarding covid- 19 screening questions, patient has not been tested, but is not showing symptoms; states has not had any cases. Please advise.

## 2019-10-07 NOTE — Telephone Encounter (Signed)
If showing symptoms, then test.  Hold off appointment here until results are known.  Sorry, but the most prudent way to handle this.

## 2019-10-08 NOTE — Telephone Encounter (Signed)
Called back to facility to relay; spoke with administrator, Levie Heritage, [phone]. States patient said knee is better; will call back if needs to re-schedule, and address accordingly based on covid situation.

## 2019-10-16 IMAGING — DX DG CHEST 2V
2 series · 2 of 2 positions shown · non-contrast
Comparison: 08/08/2017

CLINICAL DATA: Code sepsis

EXAM:
CHEST - 2 VIEW

[chest pa]
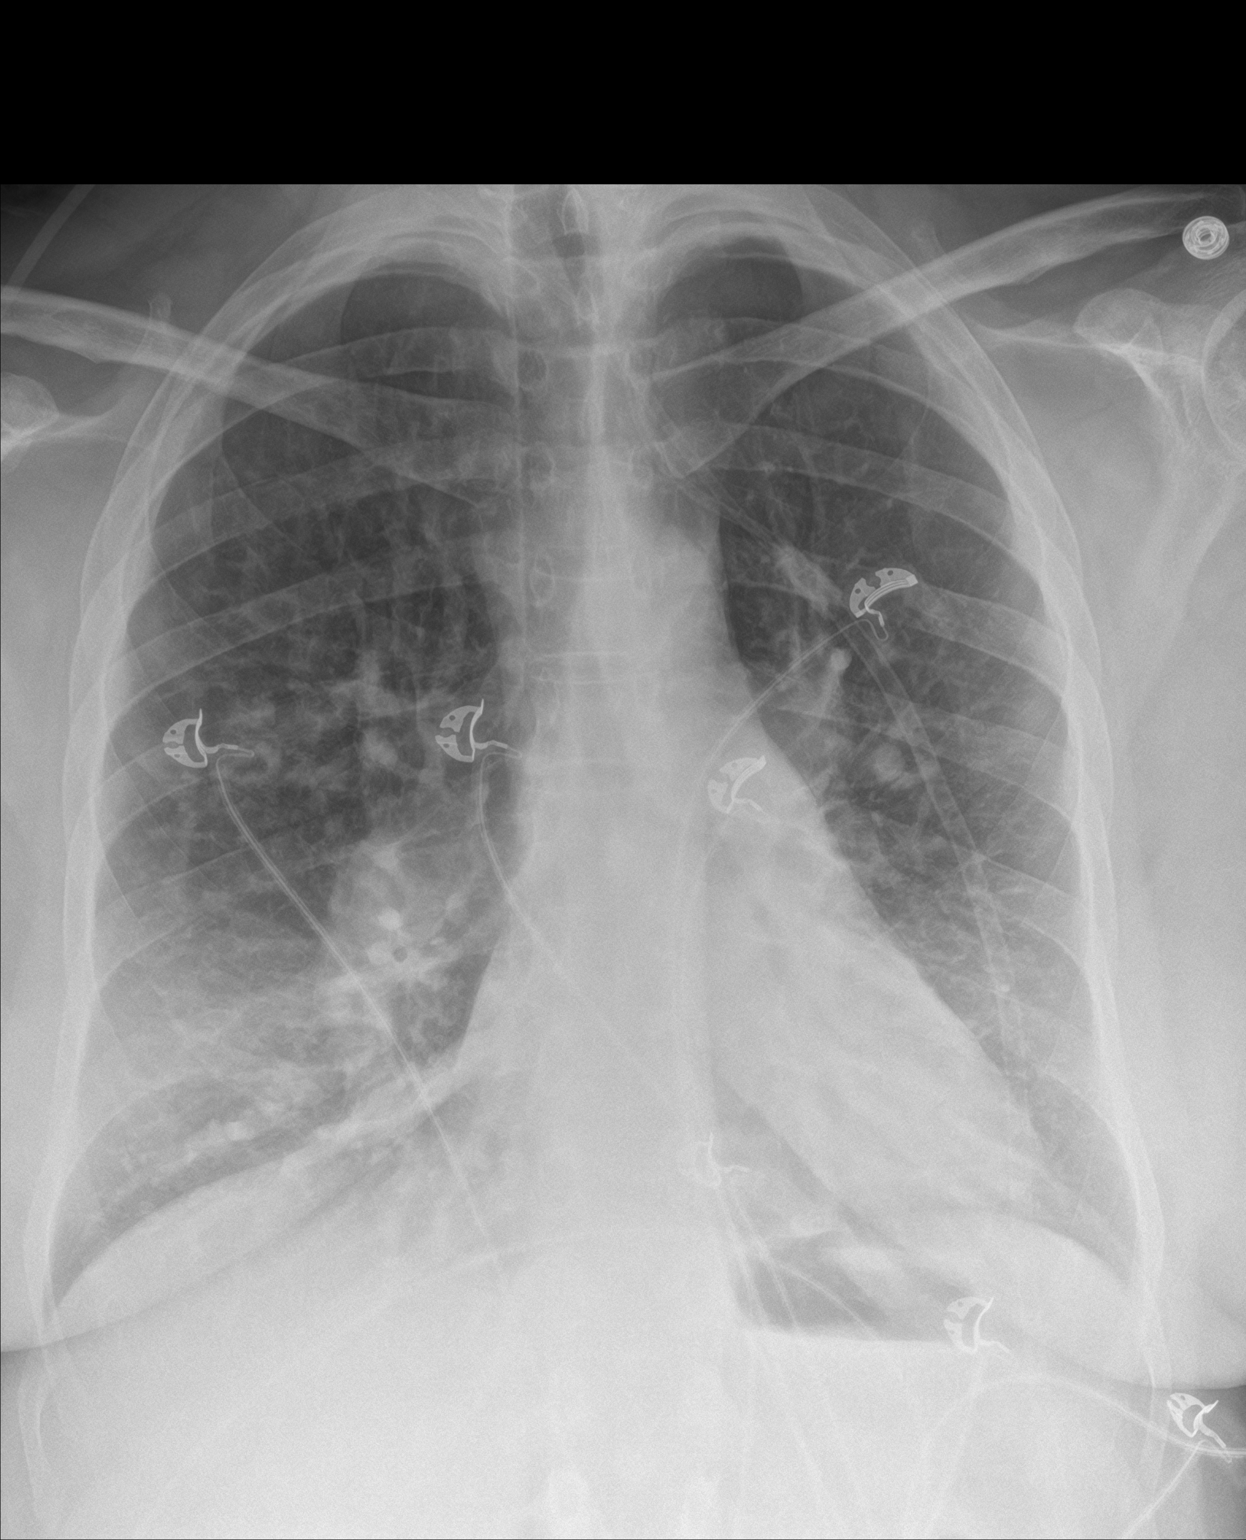

[chest lat]
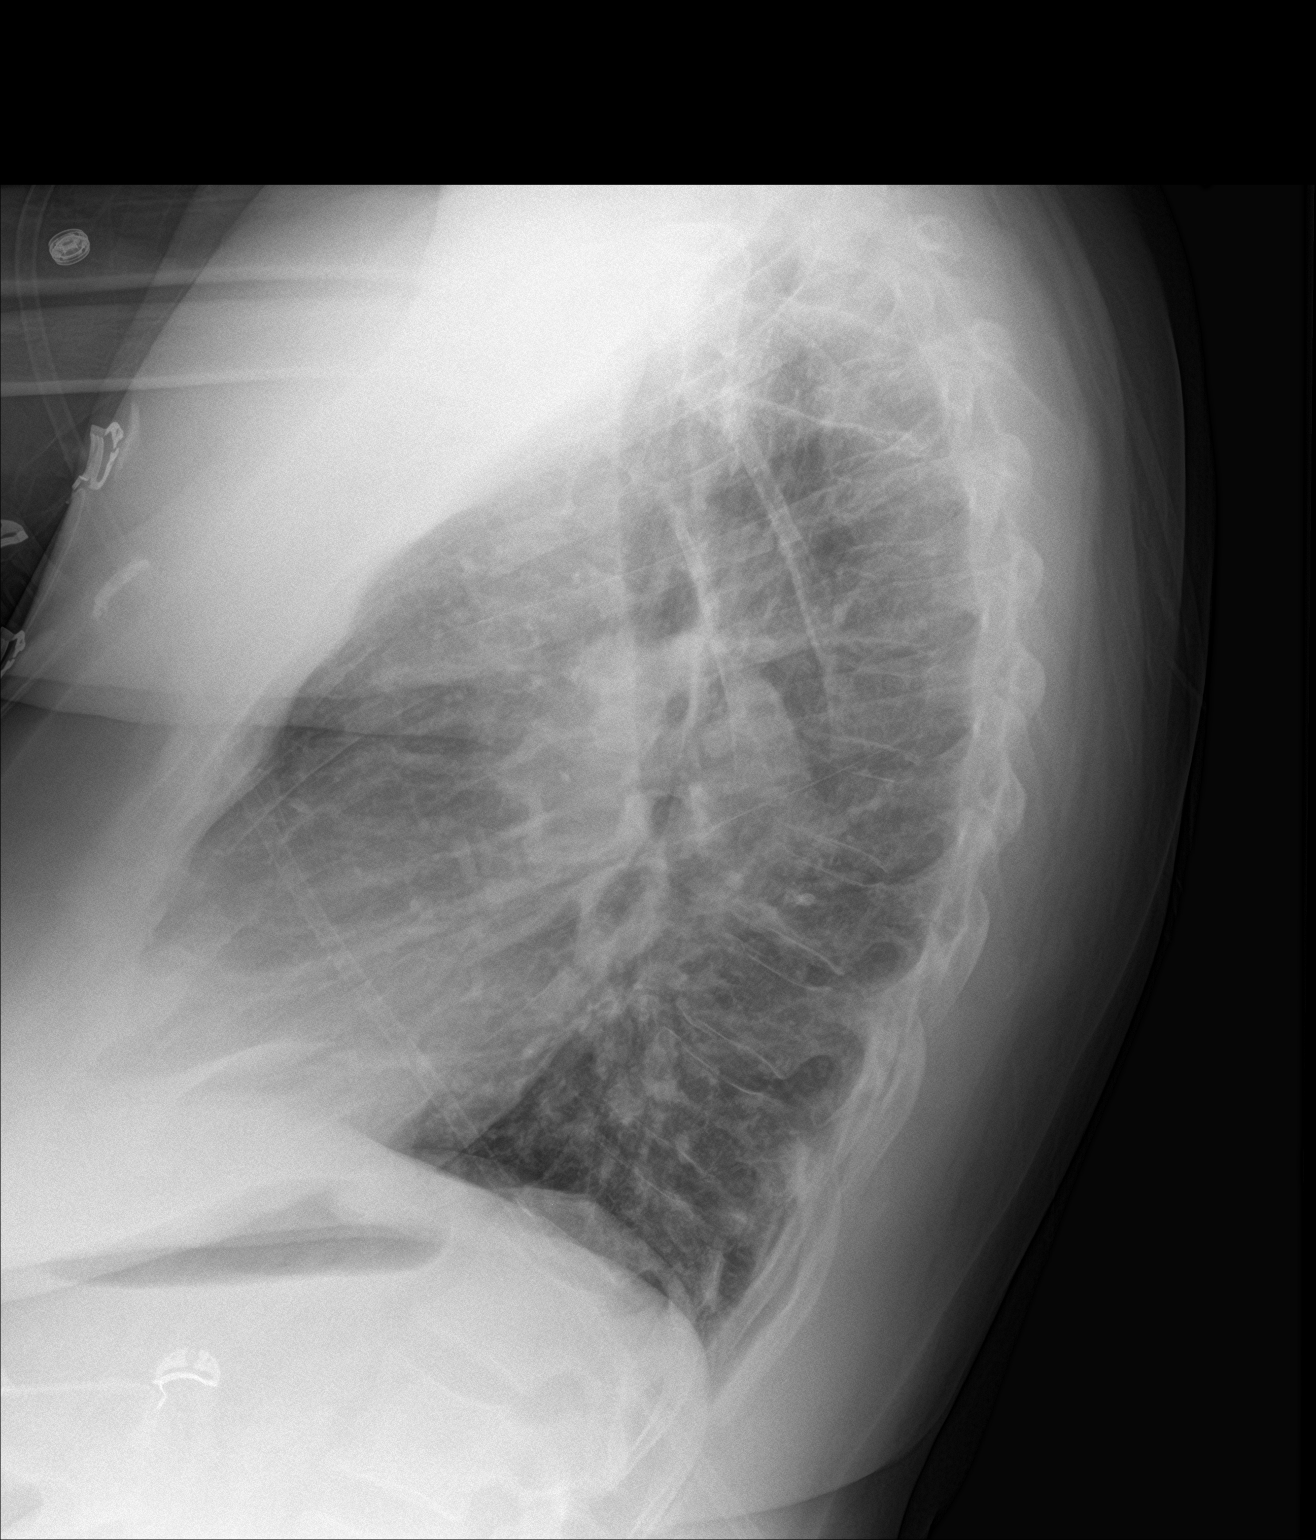

[2 of 2 positions shown; findings below may reference images not displayed]

FINDINGS: Some increase in right middle lobe infiltrate or atelectasis. Left
lung clear.

Heart size and mediastinal contours are within normal limits.

No effusion.  No pneumothorax.

Visualized bones unremarkable.
IMPRESSION: 1. Increasing right middle lobe infiltrate/atelectasis.

## 2019-11-24 ENCOUNTER — Telehealth: Payer: Self-pay | Admitting: Orthopaedic Surgery

## 2019-11-24 NOTE — Telephone Encounter (Signed)
OK 

## 2019-11-24 NOTE — Telephone Encounter (Signed)
Karina Clarke from [facility] called and wanted schedule appointment for this lady.  She said the patient has not had any symptoms of Covid.   There has been no outbreak of Covid at the facility therefore no testing has been done.   Please advise if you feel it is okay to schedule an appointment for her in the office.  Thanks

## 2019-11-25 ENCOUNTER — Encounter: Payer: Self-pay | Admitting: Orthopaedic Surgery

## 2019-11-25 ENCOUNTER — Other Ambulatory Visit: Payer: Self-pay

## 2019-11-25 ENCOUNTER — Ambulatory Visit (INDEPENDENT_AMBULATORY_CARE_PROVIDER_SITE_OTHER): Payer: Medicaid Other | Admitting: Orthopaedic Surgery

## 2019-11-25 VITALS — BP 136/81 | HR 86 | Temp 96.7°F | Ht 71.0 in | Wt 220.0 lb

## 2019-11-25 DIAGNOSIS — M25561 Pain in right knee: Secondary | ICD-10-CM

## 2019-11-25 DIAGNOSIS — G8929 Other chronic pain: Secondary | ICD-10-CM | POA: Diagnosis not present

## 2019-11-25 NOTE — Progress Notes (Signed)
CC:  I have pain of my right knee. I would like an injection.  The patient has chronic pain of the right knee.  There is no recent trauma.  There is no redness.  Injections in the past have helped.  The knee has no redness, has an effusion and crepitus present.  ROM of the right knee is 0-100.  Impression:  Chronic knee pain right  Return: 2 months  PROCEDURE NOTE:  The patient requests injections of the right knee , verbal consent was obtained.  The right knee was prepped appropriately after time out was performed.   Sterile technique was observed and injection of 1 cc of Depo-Medrol 40 mg with several cc's of plain xylocaine. Anesthesia was provided by ethyl chloride and a 20-gauge needle was used to inject the knee area. The injection was tolerated well.  A band aid dressing was applied.  The patient was advised to apply ice later today and tomorrow to the injection sight as needed.  Forms for group home completed.  Electronically Signed Sanjuana Kava, MD 12/31/20209:28 AM

## 2020-01-25 ENCOUNTER — Ambulatory Visit: Payer: Medicaid Other | Admitting: Orthopaedic Surgery

## 2020-02-01 ENCOUNTER — Ambulatory Visit: Payer: Medicaid Other | Admitting: Orthopaedic Surgery

## 2020-02-22 ENCOUNTER — Other Ambulatory Visit: Payer: Self-pay

## 2020-02-22 ENCOUNTER — Ambulatory Visit: Payer: Medicaid Other | Admitting: Orthopaedic Surgery

## 2020-02-22 ENCOUNTER — Encounter: Payer: Self-pay | Admitting: Orthopaedic Surgery

## 2020-02-22 VITALS — Ht 71.0 in | Wt 220.0 lb

## 2020-02-22 DIAGNOSIS — G8929 Other chronic pain: Secondary | ICD-10-CM | POA: Diagnosis not present

## 2020-02-22 DIAGNOSIS — M25562 Pain in left knee: Secondary | ICD-10-CM

## 2020-02-22 NOTE — Progress Notes (Signed)
PROCEDURE NOTE:  The patient requests injections of the left knee , verbal consent was obtained.  The left knee was prepped appropriately after time out was performed.   Sterile technique was observed and injection of 1 cc of Depo-Medrol 40 mg with several cc's of plain xylocaine. Anesthesia was provided by ethyl chloride and a 20-gauge needle was used to inject the knee area. The injection was tolerated well.  A band aid dressing was applied.  The patient was advised to apply ice later today and tomorrow to the injection sight as needed.  I will see as needed.  Electronically Signed Sanjuana Kava, MD 3/30/202110:32 AM

## 2020-05-18 ENCOUNTER — Ambulatory Visit: Payer: Medicaid Other | Admitting: Orthopaedic Surgery

## 2020-06-27 ENCOUNTER — Ambulatory Visit: Payer: Medicaid Other | Admitting: Orthopaedic Surgery

## 2020-07-04 ENCOUNTER — Encounter: Payer: Self-pay | Admitting: Orthopaedic Surgery

## 2020-07-04 ENCOUNTER — Other Ambulatory Visit: Payer: Self-pay

## 2020-07-04 ENCOUNTER — Ambulatory Visit (INDEPENDENT_AMBULATORY_CARE_PROVIDER_SITE_OTHER): Payer: Medicaid Other | Admitting: Orthopaedic Surgery

## 2020-07-04 VITALS — BP 124/81 | HR 102 | Ht 71.0 in

## 2020-07-04 DIAGNOSIS — M25562 Pain in left knee: Secondary | ICD-10-CM

## 2020-07-04 DIAGNOSIS — G8929 Other chronic pain: Secondary | ICD-10-CM | POA: Diagnosis not present

## 2020-07-04 DIAGNOSIS — F609 Personality disorder, unspecified: Secondary | ICD-10-CM

## 2020-07-04 NOTE — Progress Notes (Signed)
PROCEDURE NOTE:  The patient requests injections of the left knee , verbal consent was obtained.  The left knee was prepped appropriately after time out was performed.   Sterile technique was observed and injection of 1 cc of Depo-Medrol 40 mg with several cc's of plain xylocaine. Anesthesia was provided by ethyl chloride and a 20-gauge needle was used to inject the knee area. The injection was tolerated well.  A band aid dressing was applied.  The patient was advised to apply ice later today and tomorrow to the injection sight as needed.  I will see as needed.  Forms completed for rest home.  Call if any problem.  Precautions discussed.   Electronically Signed Sanjuana Kava, MD 8/10/202110:15 AM

## 2020-10-05 ENCOUNTER — Ambulatory Visit (INDEPENDENT_AMBULATORY_CARE_PROVIDER_SITE_OTHER): Payer: Medicaid Other | Admitting: Orthopaedic Surgery

## 2020-10-05 ENCOUNTER — Encounter: Payer: Self-pay | Admitting: Orthopaedic Surgery

## 2020-10-05 ENCOUNTER — Other Ambulatory Visit: Payer: Self-pay

## 2020-10-05 VITALS — BP 127/79 | HR 123 | Ht 71.0 in

## 2020-10-05 DIAGNOSIS — M25562 Pain in left knee: Secondary | ICD-10-CM

## 2020-10-05 DIAGNOSIS — G8929 Other chronic pain: Secondary | ICD-10-CM | POA: Diagnosis not present

## 2020-10-05 DIAGNOSIS — F609 Personality disorder, unspecified: Secondary | ICD-10-CM

## 2020-10-05 NOTE — Progress Notes (Signed)
PROCEDURE NOTE:  The patient requests injections of the left knee , verbal consent was obtained.  The left knee was prepped appropriately after time out was performed.   Sterile technique was observed and injection of 1 cc of Depo-Medrol 40 mg with several cc's of plain xylocaine. Anesthesia was provided by ethyl chloride and a 20-gauge needle was used to inject the knee area. The injection was tolerated well.  A band aid dressing was applied.  The patient was advised to apply ice later today and tomorrow to the injection sight as needed.  See as needed.  Forms for rest home completed.  Walker adjusted.  Call if any problem.  Precautions discussed.   Electronically Signed Sanjuana Kava, MD 11/11/20219:00 AM

## 2020-10-12 ENCOUNTER — Other Ambulatory Visit (HOSPITAL_COMMUNITY): Payer: Self-pay | Admitting: Internal Medicine

## 2020-10-12 DIAGNOSIS — Z1231 Encounter for screening mammogram for malignant neoplasm of breast: Secondary | ICD-10-CM

## 2020-10-26 ENCOUNTER — Ambulatory Visit (HOSPITAL_COMMUNITY): Payer: Medicaid Other

## 2020-12-28 ENCOUNTER — Other Ambulatory Visit: Payer: Self-pay

## 2020-12-28 ENCOUNTER — Ambulatory Visit (INDEPENDENT_AMBULATORY_CARE_PROVIDER_SITE_OTHER): Payer: Medicaid Other | Admitting: Orthopaedic Surgery

## 2020-12-28 ENCOUNTER — Encounter: Payer: Self-pay | Admitting: Orthopaedic Surgery

## 2020-12-28 VITALS — Ht 71.0 in | Wt 220.0 lb

## 2020-12-28 DIAGNOSIS — G8929 Other chronic pain: Secondary | ICD-10-CM | POA: Diagnosis not present

## 2020-12-28 DIAGNOSIS — M25562 Pain in left knee: Secondary | ICD-10-CM

## 2020-12-28 DIAGNOSIS — F609 Personality disorder, unspecified: Secondary | ICD-10-CM

## 2020-12-28 NOTE — Progress Notes (Signed)
CC:  I have pain of my left knee. I would like an injection.  The patient has chronic pain of the left knee.  There is no recent trauma.  There is no redness.  Injections in the past have helped.  The knee has no redness, has an effusion and crepitus present.  ROM of the left knee is 0-105.  Impression:  Chronic knee pain left  Return: 6 weeks   Forms completed for the rest home.  Electronically Signed Sanjuana Kava, MD 2/3/202210:57 AM

## 2021-02-08 ENCOUNTER — Encounter: Payer: Self-pay | Admitting: Orthopaedic Surgery

## 2021-02-08 ENCOUNTER — Ambulatory Visit: Payer: Medicaid Other

## 2021-02-08 ENCOUNTER — Ambulatory Visit (INDEPENDENT_AMBULATORY_CARE_PROVIDER_SITE_OTHER): Payer: Medicaid Other | Admitting: Orthopaedic Surgery

## 2021-02-08 ENCOUNTER — Other Ambulatory Visit: Payer: Self-pay

## 2021-02-08 DIAGNOSIS — M25562 Pain in left knee: Secondary | ICD-10-CM | POA: Diagnosis not present

## 2021-02-08 DIAGNOSIS — G8929 Other chronic pain: Secondary | ICD-10-CM

## 2021-02-08 DIAGNOSIS — M25561 Pain in right knee: Secondary | ICD-10-CM | POA: Diagnosis not present

## 2021-02-08 DIAGNOSIS — W19XXXA Unspecified fall, initial encounter: Secondary | ICD-10-CM | POA: Diagnosis not present

## 2021-02-08 NOTE — Progress Notes (Signed)
She fell and hurt the left knee two days ago.  I got X-rays, reported separately.  She has no acute fracture.  Encounter Diagnosis  Name Primary?  . Chronic pain of left knee Yes   PROCEDURE NOTE:  The patient requests injections of the right knee , verbal consent was obtained.  The right knee was prepped appropriately after time out was performed.   Sterile technique was observed and injection of 1 cc of Celestone 6 mg with several cc's of plain xylocaine. Anesthesia was provided by ethyl chloride and a 20-gauge needle was used to inject the knee area. The injection was tolerated well.  A band aid dressing was applied.  The patient was advised to apply ice later today and tomorrow to the injection sight as needed.  Forms for rest home completed.  Return in six weeks.  Call if any problem.  Precautions discussed.   Electronically Signed Sanjuana Kava, MD 3/17/202211:05 AM

## 2021-03-22 ENCOUNTER — Other Ambulatory Visit: Payer: Self-pay

## 2021-03-22 ENCOUNTER — Encounter: Payer: Self-pay | Admitting: Orthopaedic Surgery

## 2021-03-22 ENCOUNTER — Ambulatory Visit (INDEPENDENT_AMBULATORY_CARE_PROVIDER_SITE_OTHER): Payer: Medicaid Other | Admitting: Orthopaedic Surgery

## 2021-03-22 DIAGNOSIS — G8929 Other chronic pain: Secondary | ICD-10-CM

## 2021-03-22 DIAGNOSIS — M25561 Pain in right knee: Secondary | ICD-10-CM | POA: Diagnosis not present

## 2021-03-22 DIAGNOSIS — M25562 Pain in left knee: Secondary | ICD-10-CM

## 2021-03-22 NOTE — Progress Notes (Signed)
PROCEDURE NOTE:  The patient requests injections of the left knee , verbal consent was obtained.  The left knee was prepped appropriately after time out was performed.   Sterile technique was observed and injection of 1 cc of Celestone 6 mg with several cc's of plain xylocaine. Anesthesia was provided by ethyl chloride and a 20-gauge needle was used to inject the knee area. The injection was tolerated well.  A band aid dressing was applied.  The patient was advised to apply ice later today and tomorrow to the injection sight as needed.  PROCEDURE NOTE:  The patient requests injections of the right knee , verbal consent was obtained.  The right knee was prepped appropriately after time out was performed.   Sterile technique was observed and injection of 1 cc of Celestone 6 mg with several cc's of plain xylocaine. Anesthesia was provided by ethyl chloride and a 20-gauge needle was used to inject the knee area. The injection was tolerated well.  A band aid dressing was applied.  The patient was advised to apply ice later today and tomorrow to the injection sight as needed.  Return in six weeks.  Call if any problem. Forms for rest home completed. Precautions discussed.   Electronically Signed Sanjuana Kava, MD 4/28/202210:49 AM

## 2021-05-03 ENCOUNTER — Ambulatory Visit: Payer: Medicaid Other | Admitting: Orthopaedic Surgery

## 2021-05-10 ENCOUNTER — Emergency Department (HOSPITAL_COMMUNITY): Payer: Medicaid Other

## 2021-05-10 ENCOUNTER — Ambulatory Visit: Payer: Medicaid Other | Admitting: Orthopaedic Surgery

## 2021-05-10 ENCOUNTER — Other Ambulatory Visit: Payer: Self-pay

## 2021-05-10 ENCOUNTER — Inpatient Hospital Stay (HOSPITAL_COMMUNITY)
Admission: EM | Admit: 2021-05-10 | Discharge: 2021-05-15 | DRG: 871 | Disposition: A | Discharge status: Home Health | Payer: Medicaid Other | Source: Skilled Nursing Facility | Attending: Internal Medicine | Admitting: Internal Medicine

## 2021-05-10 ENCOUNTER — Inpatient Hospital Stay (HOSPITAL_COMMUNITY): Payer: Medicaid Other

## 2021-05-10 ENCOUNTER — Encounter (HOSPITAL_COMMUNITY): Payer: Self-pay

## 2021-05-10 DIAGNOSIS — D6959 Other secondary thrombocytopenia: Secondary | ICD-10-CM | POA: Diagnosis not present

## 2021-05-10 DIAGNOSIS — Z794 Long term (current) use of insulin: Secondary | ICD-10-CM

## 2021-05-10 DIAGNOSIS — N184 Chronic kidney disease, stage 4 (severe): Secondary | ICD-10-CM | POA: Diagnosis not present

## 2021-05-10 DIAGNOSIS — Z79899 Other long term (current) drug therapy: Secondary | ICD-10-CM | POA: Diagnosis not present

## 2021-05-10 DIAGNOSIS — G9341 Metabolic encephalopathy: Secondary | ICD-10-CM | POA: Diagnosis not present

## 2021-05-10 DIAGNOSIS — F200 Paranoid schizophrenia: Secondary | ICD-10-CM | POA: Diagnosis present

## 2021-05-10 DIAGNOSIS — I129 Hypertensive chronic kidney disease with stage 1 through stage 4 chronic kidney disease, or unspecified chronic kidney disease: Secondary | ICD-10-CM | POA: Diagnosis present

## 2021-05-10 DIAGNOSIS — K3189 Other diseases of stomach and duodenum: Secondary | ICD-10-CM | POA: Diagnosis not present

## 2021-05-10 DIAGNOSIS — E119 Type 2 diabetes mellitus without complications: Secondary | ICD-10-CM

## 2021-05-10 DIAGNOSIS — R1314 Dysphagia, pharyngoesophageal phase: Secondary | ICD-10-CM | POA: Diagnosis not present

## 2021-05-10 DIAGNOSIS — J189 Pneumonia, unspecified organism: Secondary | ICD-10-CM | POA: Diagnosis not present

## 2021-05-10 DIAGNOSIS — Y95 Nosocomial condition: Secondary | ICD-10-CM | POA: Diagnosis not present

## 2021-05-10 DIAGNOSIS — E7800 Pure hypercholesterolemia, unspecified: Secondary | ICD-10-CM | POA: Diagnosis present

## 2021-05-10 DIAGNOSIS — R4182 Altered mental status, unspecified: Secondary | ICD-10-CM | POA: Diagnosis present

## 2021-05-10 DIAGNOSIS — F70 Mild intellectual disabilities: Secondary | ICD-10-CM | POA: Diagnosis present

## 2021-05-10 DIAGNOSIS — E8809 Other disorders of plasma-protein metabolism, not elsewhere classified: Secondary | ICD-10-CM | POA: Diagnosis not present

## 2021-05-10 DIAGNOSIS — J440 Chronic obstructive pulmonary disease with acute lower respiratory infection: Secondary | ICD-10-CM | POA: Diagnosis present

## 2021-05-10 DIAGNOSIS — A419 Sepsis, unspecified organism: Secondary | ICD-10-CM | POA: Diagnosis not present

## 2021-05-10 DIAGNOSIS — B20 Human immunodeficiency virus [HIV] disease: Secondary | ICD-10-CM | POA: Diagnosis not present

## 2021-05-10 DIAGNOSIS — Z21 Asymptomatic human immunodeficiency virus [HIV] infection status: Secondary | ICD-10-CM | POA: Diagnosis present

## 2021-05-10 DIAGNOSIS — F17210 Nicotine dependence, cigarettes, uncomplicated: Secondary | ICD-10-CM | POA: Diagnosis present

## 2021-05-10 DIAGNOSIS — I248 Other forms of acute ischemic heart disease: Secondary | ICD-10-CM | POA: Diagnosis present

## 2021-05-10 DIAGNOSIS — I10 Essential (primary) hypertension: Secondary | ICD-10-CM | POA: Diagnosis not present

## 2021-05-10 DIAGNOSIS — E11649 Type 2 diabetes mellitus with hypoglycemia without coma: Secondary | ICD-10-CM | POA: Diagnosis not present

## 2021-05-10 DIAGNOSIS — E1122 Type 2 diabetes mellitus with diabetic chronic kidney disease: Secondary | ICD-10-CM | POA: Diagnosis present

## 2021-05-10 DIAGNOSIS — R6520 Severe sepsis without septic shock: Secondary | ICD-10-CM | POA: Diagnosis present

## 2021-05-10 DIAGNOSIS — R778 Other specified abnormalities of plasma proteins: Secondary | ICD-10-CM | POA: Diagnosis not present

## 2021-05-10 DIAGNOSIS — E860 Dehydration: Secondary | ICD-10-CM | POA: Diagnosis present

## 2021-05-10 DIAGNOSIS — Z20822 Contact with and (suspected) exposure to covid-19: Secondary | ICD-10-CM | POA: Diagnosis not present

## 2021-05-10 DIAGNOSIS — E870 Hyperosmolality and hypernatremia: Secondary | ICD-10-CM | POA: Diagnosis not present

## 2021-05-10 DIAGNOSIS — Z833 Family history of diabetes mellitus: Secondary | ICD-10-CM | POA: Diagnosis not present

## 2021-05-10 DIAGNOSIS — R400 Somnolence: Secondary | ICD-10-CM

## 2021-05-10 DIAGNOSIS — J9601 Acute respiratory failure with hypoxia: Secondary | ICD-10-CM | POA: Diagnosis not present

## 2021-05-10 DIAGNOSIS — R7401 Elevation of levels of liver transaminase levels: Secondary | ICD-10-CM | POA: Diagnosis present

## 2021-05-10 DIAGNOSIS — F209 Schizophrenia, unspecified: Secondary | ICD-10-CM | POA: Diagnosis present

## 2021-05-10 DIAGNOSIS — R1310 Dysphagia, unspecified: Secondary | ICD-10-CM | POA: Diagnosis not present

## 2021-05-10 DIAGNOSIS — N179 Acute kidney failure, unspecified: Secondary | ICD-10-CM | POA: Diagnosis present

## 2021-05-10 LAB — URINALYSIS, ROUTINE W REFLEX MICROSCOPIC
Glucose, UA: NEGATIVE mg/dL
Ketones, ur: NEGATIVE mg/dL
Nitrite: NEGATIVE
Protein, ur: 30 mg/dL — AB
Specific Gravity, Urine: >1.03 — ABNORMAL HIGH (ref 1.005–1.030)
pH: 5.5 (ref 5.0–8.0)

## 2021-05-10 LAB — CBC WITH DIFFERENTIAL/PLATELET
Abs Immature Granulocytes: 0.02 10*3/uL (ref 0.00–0.07)
Basophils Absolute: 0 10*3/uL (ref 0.0–0.1)
Basophils Relative: 1 %
Eosinophils Absolute: 0 10*3/uL (ref 0.0–0.5)
Eosinophils Relative: 0 %
HCT: 45.8 % (ref 36.0–46.0)
Hemoglobin: 14.9 g/dL (ref 12.0–15.0)
Immature Granulocytes: 0 %
Lymphocytes Relative: 31 %
Lymphs Abs: 1.7 10*3/uL (ref 0.7–4.0)
MCH: 31.8 pg (ref 26.0–34.0)
MCHC: 32.5 g/dL (ref 30.0–36.0)
MCV: 97.9 fL (ref 80.0–100.0)
Monocytes Absolute: 0.7 10*3/uL (ref 0.1–1.0)
Monocytes Relative: 12 %
Neutro Abs: 3 10*3/uL (ref 1.7–7.7)
Neutrophils Relative %: 56 %
Platelets: 90 10*3/uL — ABNORMAL LOW (ref 150–400)
RBC: 4.68 MIL/uL (ref 3.87–5.11)
RDW: 15.7 % — ABNORMAL HIGH (ref 11.5–15.5)
WBC: 5.4 10*3/uL (ref 4.0–10.5)
nRBC: 0.4 % — ABNORMAL HIGH (ref 0.0–0.2)

## 2021-05-10 LAB — COMPREHENSIVE METABOLIC PANEL
ALT: 272 U/L — ABNORMAL HIGH (ref 0–44)
ALT: 242 U/L — ABNORMAL HIGH (ref 0–44)
AST: 913 U/L — ABNORMAL HIGH (ref 15–41)
AST: 776 U/L — ABNORMAL HIGH (ref 15–41)
Albumin: 3.3 g/dL — ABNORMAL LOW (ref 3.5–5.0)
Albumin: 3 g/dL — ABNORMAL LOW (ref 3.5–5.0)
Alkaline Phosphatase: 82 U/L (ref 38–126)
Alkaline Phosphatase: 75 U/L (ref 38–126)
Anion gap: 14 (ref 5–15)
Anion gap: 8 (ref 5–15)
BUN: 83 mg/dL — ABNORMAL HIGH (ref 6–20)
BUN: 74 mg/dL — ABNORMAL HIGH (ref 6–20)
CO2: 22 mmol/L (ref 22–32)
CO2: 23 mmol/L (ref 22–32)
Calcium: 7.8 mg/dL — ABNORMAL LOW (ref 8.9–10.3)
Calcium: 7.6 mg/dL — ABNORMAL LOW (ref 8.9–10.3)
Chloride: 102 mmol/L (ref 98–111)
Chloride: 108 mmol/L (ref 98–111)
Creatinine, Ser: 5.15 mg/dL — ABNORMAL HIGH (ref 0.44–1.00)
Creatinine, Ser: 4.01 mg/dL — ABNORMAL HIGH (ref 0.44–1.00)
GFR, Estimated: 9 mL/min — ABNORMAL LOW (ref >60)
GFR, Estimated: 12 mL/min — ABNORMAL LOW (ref >60)
Glucose, Bld: 97 mg/dL (ref 70–99)
Glucose, Bld: 99 mg/dL (ref 70–99)
Potassium: 4.7 mmol/L (ref 3.5–5.1)
Potassium: 4.7 mmol/L (ref 3.5–5.1)
Sodium: 138 mmol/L (ref 135–145)
Sodium: 139 mmol/L (ref 135–145)
Total Bilirubin: 0.3 mg/dL (ref 0.3–1.2)
Total Bilirubin: 0.1 mg/dL — ABNORMAL LOW (ref 0.3–1.2)
Total Protein: 7.2 g/dL (ref 6.5–8.1)
Total Protein: 6.5 g/dL (ref 6.5–8.1)

## 2021-05-10 LAB — URINALYSIS, MICROSCOPIC (REFLEX)

## 2021-05-10 LAB — LACTIC ACID, PLASMA
Lactic Acid, Venous: 2 mmol/L (ref 0.5–1.9)
Lactic Acid, Venous: 1.7 mmol/L (ref 0.5–1.9)

## 2021-05-10 LAB — APTT: aPTT: 25 seconds (ref 24–36)

## 2021-05-10 LAB — PROTIME-INR
INR: 1 (ref 0.8–1.2)
INR: 1 (ref 0.8–1.2)
Prothrombin Time: 13.6 seconds (ref 11.4–15.2)
Prothrombin Time: 13.2 seconds (ref 11.4–15.2)

## 2021-05-10 LAB — TROPONIN I (HIGH SENSITIVITY)
Troponin I (High Sensitivity): 331 ng/L (ref <18)
Troponin I (High Sensitivity): 396 ng/L (ref <18)
Troponin I (High Sensitivity): 360 ng/L (ref <18)

## 2021-05-10 LAB — BLOOD GAS, VENOUS
Acid-base deficit: 5.5 mmol/L — ABNORMAL HIGH (ref 0.0–2.0)
Bicarbonate: 18 mmol/L — ABNORMAL LOW (ref 20.0–28.0)
FIO2: 28
O2 Saturation: 56.5 %
Patient temperature: 37.6
pCO2, Ven: 56.1 mmHg (ref 44.0–60.0)
pH, Ven: 7.204 — ABNORMAL LOW (ref 7.250–7.430)
pO2, Ven: 40.5 mmHg (ref 32.0–45.0)

## 2021-05-10 LAB — BRAIN NATRIURETIC PEPTIDE: B Natriuretic Peptide: 21 pg/mL (ref 0.0–100.0)

## 2021-05-10 LAB — RAPID URINE DRUG SCREEN, HOSP PERFORMED
Amphetamines: NOT DETECTED
Barbiturates: NOT DETECTED
Benzodiazepines: POSITIVE — AB
Cocaine: NOT DETECTED
Opiates: NOT DETECTED
Tetrahydrocannabinol: NOT DETECTED

## 2021-05-10 LAB — AMMONIA: Ammonia: 20 umol/L (ref 9–35)

## 2021-05-10 LAB — ACETAMINOPHEN LEVEL
Acetaminophen (Tylenol), Serum: <10 ug/mL — ABNORMAL LOW (ref 10–30)
Acetaminophen (Tylenol), Serum: <10 ug/mL — ABNORMAL LOW (ref 10–30)

## 2021-05-10 LAB — RESP PANEL BY RT-PCR (FLU A&B, COVID) ARPGX2
Influenza A by PCR: NEGATIVE
Influenza B by PCR: NEGATIVE
SARS Coronavirus 2 by RT PCR: NEGATIVE

## 2021-05-10 LAB — CBG MONITORING, ED: Glucose-Capillary: 80 mg/dL (ref 70–99)

## 2021-05-10 LAB — VALPROIC ACID LEVEL: Valproic Acid Lvl: 33 ug/mL — ABNORMAL LOW (ref 50.0–100.0)

## 2021-05-10 MED ORDER — SODIUM CHLORIDE 0.9 % IV SOLN
2.0000 g | INTRAVENOUS | Status: DC
  Administered 2021-05-10: 2 g via INTRAVENOUS
  Filled 2021-05-10: qty 20

## 2021-05-10 MED ORDER — SODIUM CHLORIDE 0.9 % IV SOLN
500.0000 mg | INTRAVENOUS | Status: DC
  Administered 2021-05-10 – 2021-05-14 (×5): 500 mg via INTRAVENOUS
  Filled 2021-05-10 (×5): qty 500

## 2021-05-10 MED ORDER — ONDANSETRON HCL 4 MG PO TABS
4.0000 mg | ORAL_TABLET | Freq: Four times a day (QID) | ORAL | Status: DC | PRN
  Administered 2021-05-13: 4 mg via ORAL
  Filled 2021-05-10: qty 1

## 2021-05-10 MED ORDER — POLYETHYLENE GLYCOL 3350 17 G PO PACK: 17.0000 g | PACK | Freq: Every day | ORAL | Status: DC | PRN

## 2021-05-10 MED ORDER — SODIUM CHLORIDE 0.9 % IV SOLN: INTRAVENOUS | Status: DC

## 2021-05-10 MED ORDER — NALOXONE HCL 2 MG/2ML IJ SOSY
2.0000 mg | PREFILLED_SYRINGE | Freq: Once | INTRAMUSCULAR | Status: AC
  Administered 2021-05-10: 2 mg via INTRAVENOUS
  Filled 2021-05-10: qty 2

## 2021-05-10 MED ORDER — SODIUM CHLORIDE 0.9 % IV BOLUS
1000.0000 mL | Freq: Once | INTRAVENOUS | Status: AC
  Administered 2021-05-10: 1000 mL via INTRAVENOUS

## 2021-05-10 MED ORDER — SODIUM CHLORIDE 0.9 % IV SOLN
3.0000 g | INTRAVENOUS | Status: DC
  Administered 2021-05-10: 3 g via INTRAVENOUS
  Filled 2021-05-10: qty 8

## 2021-05-10 MED ORDER — INSULIN ASPART 100 UNIT/ML IJ SOLN
0.0000 [IU] | Freq: Four times a day (QID) | INTRAMUSCULAR | Status: DC
  Administered 2021-05-12 – 2021-05-13 (×3): 1 [IU] via SUBCUTANEOUS
  Administered 2021-05-14: 3 [IU] via SUBCUTANEOUS
  Administered 2021-05-15: 1 [IU] via SUBCUTANEOUS

## 2021-05-10 MED ORDER — LACTATED RINGERS IV SOLN: INTRAVENOUS | Status: DC

## 2021-05-10 MED ORDER — ONDANSETRON HCL 4 MG/2ML IJ SOLN: 4.0000 mg | Freq: Four times a day (QID) | INTRAMUSCULAR | Status: DC | PRN

## 2021-05-10 MED ORDER — SODIUM CHLORIDE 0.9 % IV BOLUS
2000.0000 mL | Freq: Once | INTRAVENOUS | Status: AC
  Administered 2021-05-10: 2000 mL via INTRAVENOUS

## 2021-05-10 NOTE — ED Provider Notes (Signed)
[facility] EMERGENCY DEPARTMENT Provider Note   CSN: [id number] Arrival date & time: 05/10/21  1225     History Chief Complaint  Patient presents with   Altered Mental Status    Karina Clarke is a 60 y.o. female.  Patient presents from the nursing home with altered mental status.  She was given Narcan without any help.  Patient has a history of schizophrenia COPD and HIV.  The history is provided by the nursing home and medical records.  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 of post-op N/V   High cholesterol    HIV (human immunodeficiency virus infection) ([place])    Hypertension    Limp    Mild intellectual disability    No natural teeth    does not wear her dentures   Paranoid schizophrenia ([place])    Personality disorder ([place])    Schizophrenia ([place])     Patient Active Problem List   Diagnosis Date Noted   Diabetes mellitus, new onset ([place]) 10/24/2017   Hyperkalemia 10/24/2017   ARF (acute renal failure) ([place]) 10/24/2017   COPD exacerbation ([place]) 10/21/2017   HCAP (healthcare-associated pneumonia) 10/12/2017   Acute respiratory failure with hypoxia ([place]) 10/12/2017   AIDS ([place]) 10/12/2017   Influenza 12/26/2016   Sepsis ([place]) 12/26/2016   CAP (community acquired pneumonia) 12/26/2016   Tobacco use disorder 12/26/2016   HIV (human immunodeficiency virus infection) ([place]) 12/26/2016   Schizophrenia ([place]) 12/26/2016   Personality disorder  ([place]) 08/22/2016   Encounter for screening colonoscopy 05/24/2015   Vaginal discharge 11/04/2013   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 the left colon is redundant 2. the examination was otherwise normal 3. small internal hemorrhoids 4. moderate sized external hemorrhoids   HEMORRHOID SURGERY     UMBILICAL HERNIA REPAIR       OB History     Gravida  2   Para  2   Term      Preterm      AB      Living  2      SAB      IAB      Ectopic      Multiple      Live Births              Family History  Problem Relation Age of Onset   Anesthesia problems Sister        post-op N/V   Diabetes Father  Social History   Tobacco Use   Smoking status: Every Day    Packs/day: 1.00    Years: 18.00    Pack years: 18.00    Types: Cigarettes   Smokeless tobacco: Never  Substance Use Topics   Alcohol use: No    Alcohol/week: 0.0 standard drinks   Drug use: No    Home Medications Prior to Admission medications   Medication Sig Start Date End Date Taking? Authorizing Provider  abacavir-lamiVUDine (EPZICOM) 600-300 MG per tablet Take 1 tablet by mouth daily.   Yes [provider]  acetaminophen (TYLENOL) 650 MG CR tablet Take 650 mg by mouth every 8 (eight) hours as needed for pain.   Yes [provider]  albuterol (PROVENTIL HFA;VENTOLIN HFA) 108 (90 Base) MCG/ACT inhaler Inhale 1-2 puffs into the lungs 2 (two) times daily as needed for wheezing or shortness of breath.    Yes [provider]  ALPRAZolam Duanne Moron) 0.5 MG tablet Take 0.5 mg by mouth 2 (two) times daily. 02/01/20  Yes [provider]  atazanavir (REYATAZ) 200 MG capsule Take 400 mg by mouth daily.    Yes [provider]  atorvastatin (LIPITOR) 10 MG tablet Take 20 mg by mouth daily.   Yes [provider]  divalproex (DEPAKOTE) 500 MG DR tablet Take 500 mg by mouth 2 (two) times daily. Take 1 tablet (500 mg) by mouth at 4pm and 8pm daily   Yes [provider]  haloperidol (HALDOL) 10 MG tablet Take 20 mg by mouth 3 (three) times daily.    Yes [provider]  haloperidol decanoate (HALDOL DECANOATE) 100 MG/ML injection Inject 200 mg into the muscle every 28 (twenty-eight) days. Last injection 06/09/15   Yes [provider]  hydrOXYzine (VISTARIL) 25 MG capsule Take 25 mg by mouth at bedtime.    Yes [provider]  insulin detemir (LEVEMIR) 100 UNIT/ML injection Inject 0.33 mLs (33 Units total) into the skin at bedtime. 10/27/17  Yes Isaac Bliss, Rayford Halsted, MD  Magnesium Oxide 400 MG CAPS Take 1 capsule (400 mg total) by mouth every morning. 08/10/17  Yes Reyne Dumas, MD  metoprolol tartrate (LOPRESSOR) 25 MG tablet Take 1 tablet by mouth 2 (two) times daily. 05/03/21  Yes [provider]  polyethylene glycol (MIRALAX / GLYCOLAX) packet Take 17 g by mouth daily as needed for mild constipation.    Yes [provider]  QUEtiapine (SEROQUEL) 200 MG tablet Take 200 mg by mouth 2 (two) times daily. Take 1 tablet (200 mg) by mouth with supper and at bedtime daily   Yes [provider]  raltegravir (ISENTRESS) 400 MG tablet Take 400 mg by mouth 2 (two) times daily.   Yes [provider]  trihexyphenidyl (ARTANE) 2 MG tablet Take 2 mg by mouth 2 (two) times daily with a meal.   Yes [provider]  valACYclovir (VALTREX) 1000 MG tablet Take 1,000 mg by mouth daily.    Yes [provider]   zolpidem (AMBIEN) 10 MG tablet Take 5 mg by mouth at bedtime.    Yes [provider]  HYDROcodone-acetaminophen (NORCO/VICODIN) 5-325 MG tablet One tablet every four hours as needed for acute pain.  Limit of five days per [place]. Patient not taking: No sig reported 07/22/18   Sanjuana Kava, MD  ibuprofen (ADVIL,MOTRIN) 400 MG tablet Take 400 mg by mouth every 8 (eight) hours as needed for moderate pain. Patient not taking: No sig reported  [provider]  insulin aspart (NOVOLOG) 100 UNIT/ML injection Inject 0-20 Units into the skin 3 (three) times daily with meals. Patient not taking: No sig reported    [provider]    Allergies    Asa [aspirin]  Review of Systems   Review of Systems  Unable to perform ROS: Mental status change  Gastrointestinal:  Negative for abdominal pain.   Physical Exam Updated Vital Signs BP 124/77   Pulse 97   Temp 99.8 F (37.7 C) (Oral)   Resp (!) 26   Ht 5\' 8"  (1.727 m)   Wt 99.8 kg   SpO2 96%   BMI 33.45 kg/m   Physical Exam Vitals and nursing note reviewed.  Constitutional:      Appearance: She is well-developed.     Comments: Patient lethargic but protecting her airway and able to be aroused  HENT:     Head: Normocephalic.     Mouth/Throat:     Mouth: Mucous membranes are moist.  Eyes:     General: No scleral icterus.    Conjunctiva/sclera: Conjunctivae normal.  Neck:     Thyroid: No thyromegaly.  Cardiovascular:     Rate and Rhythm: Tachycardia present.     Heart sounds: No murmur heard.   No friction rub. No gallop.  Pulmonary:     Breath sounds: No stridor. No wheezing or rales.  Chest:     Chest wall: No tenderness.  Abdominal:     General: There is no distension.     Tenderness: There is no abdominal tenderness. There is no rebound.  Musculoskeletal:        General: Normal range of motion.     Cervical back: Neck supple.  Lymphadenopathy:     Cervical: No cervical adenopathy.   Skin:    Findings: No erythema or rash.  Neurological:     Motor: No abnormal muscle tone.     Coordination: Coordination normal.     Comments: Oriented to place and person    ED Results / Procedures / Treatments   Labs (all labs ordered are listed, but only abnormal results are displayed) Labs Reviewed  LACTIC ACID, PLASMA - Abnormal; Notable for the following components:      Result Value   Lactic Acid, Venous 2.0 (*)    All other components within normal limits  CBC WITH DIFFERENTIAL/PLATELET - Abnormal; Notable for the following components:   RDW 15.7 (*)    Platelets 90 (*)    nRBC 0.4 (*)    All other components within normal limits  BLOOD GAS, VENOUS - Abnormal; Notable for the following components:   pH, Ven 7.204 (*)    Bicarbonate 18.0 (*)    Acid-base deficit 5.5 (*)    All other components within normal limits  TROPONIN I (HIGH SENSITIVITY) - Abnormal; Notable for the following components:   Troponin I (High Sensitivity) 331 (*)    All other components within normal limits  CULTURE, BLOOD (SINGLE)  URINE CULTURE  RESP PANEL BY RT-PCR (FLU A&B, COVID) ARPGX2  LACTIC ACID, PLASMA  AMMONIA  BRAIN NATRIURETIC PEPTIDE  PROTIME-INR  APTT  URINALYSIS, ROUTINE W REFLEX MICROSCOPIC  RAPID URINE DRUG SCREEN, HOSP PERFORMED  COMPREHENSIVE METABOLIC PANEL  POC URINE PREG, ED  TROPONIN I (HIGH SENSITIVITY)    EKG EKG Interpretation  Date/Time:  Thursday May 10 2021 12:30:57 EDT Ventricular Rate:  111 PR Interval:  151 QRS Duration: 95 QT Interval:  334 QTC Calculation: 454 R Axis:  254 Text Interpretation: Sinus tachycardia LAE, consider biatrial enlargement Right ventricular hypertrophy Inferior infarct, old Lateral leads are also involved Confirmed by Milton Ferguson [phone]) on 05/10/2021 3:27:01 PM  Radiology CT Head Wo Contrast  Result Date: 05/10/2021 CLINICAL DATA:  Unresponsive prior to arrival. Question intracranial hemorrhage. EXAM: CT HEAD WITHOUT  CONTRAST TECHNIQUE: Contiguous axial images were obtained from the base of the skull through the vertex without intravenous contrast. COMPARISON:  None. FINDINGS: Brain: Brainstem appears normal. No focal cerebellar finding. Cerebral hemispheres show moderate chronic small-vessel ischemic changes of the white matter. No sign of acute infarction, mass lesion, hemorrhage, hydrocephalus or extra-axial collection. Vascular: There is atherosclerotic calcification of the major vessels at the base of the brain. Skull: Negative Sinuses/Orbits: Clear/normal Other: None IMPRESSION: No acute finding by CT. Moderate chronic small-vessel ischemic changes of the cerebral hemispheric white matter. Electronically Signed   By: Nelson Chimes M.D.   On: 05/10/2021 13:56   DG Chest Port 1 View  Result Date: 05/10/2021 CLINICAL DATA:  Unresponsive with concern for drug overdose EXAM: PORTABLE CHEST 1 VIEW COMPARISON:  January 27, 2018 chest radiograph. Chest CT October 17, 2017 FINDINGS: There are areas of ill-defined airspace opacity in right mid and lower lung regions. Lungs elsewhere are clear. Heart is upper normal in size, stable, with prominence of central pulmonary arteries, consistent with pulmonary arterial hypertension. No adenopathy appreciable. No bone lesions IMPRESSION: Areas of ill-defined opacity right mid and lower lung regions. Suspect early pneumonia or possible foci of aspiration. Lungs elsewhere clear. Heart upper normal in size with evidence of pulmonary arterial hypertension. Note that pulmonary arterial hypertension was present on prior CT examination. Electronically Signed   By: Lowella Grip III M.D.   On: 05/10/2021 13:23    Procedures Procedures   Medications Ordered in ED Medications  lactated ringers infusion (has no administration in time range)  cefTRIAXone (ROCEPHIN) 2 g in sodium chloride 0.9 % 100 mL IVPB (0 g Intravenous Stopped 05/10/21 1509)  azithromycin (ZITHROMAX) 500 mg in sodium  chloride 0.9 % 250 mL IVPB (500 mg Intravenous New Bag/Given 05/10/21 1509)  naloxone [facility]) injection 2 mg (2 mg Intravenous Given 05/10/21 1246)  sodium chloride 0.9 % bolus 1,000 mL (0 mLs Intravenous Stopped 05/10/21 1424)  sodium chloride 0.9 % bolus 2,000 mL (2,000 mLs Intravenous New Bag/Given 05/10/21 1430)    ED Course  I have reviewed the triage vital signs and the nursing notes.  Pertinent labs & imaging results that were available during my care of the patient were reviewed by me and considered in my medical decision making (see chart for details). CRITICAL CARE Performed by: Milton Ferguson Total critical care time: 40 minutes Critical care time was exclusive of separately billable procedures and treating other patients. Critical care was necessary to treat or prevent imminent or life-threatening deterioration. Critical care was time spent personally by me on the following activities: development of treatment plan with patient and/or surrogate as well as nursing, discussions with consultants, evaluation of patient's response to treatment, examination of patient, obtaining history from patient or surrogate, ordering and performing treatments and interventions, ordering and review of laboratory studies, ordering and review of radiographic studies, pulse oximetry and re-evaluation of patient's condition.    MDM Rules/Calculators/A&P                          Patient with pneumonia altered mental status and elevated troponin.  She is treated initially as sepsis  with pneumonia and will be admitted to medicine.  Chemistries are still pending my colleague follow-up on that Final Clinical Impression(s) / ED Diagnoses Final diagnoses:  None    Rx / DC Orders ED Discharge Orders     None        Milton Ferguson, MD 05/14/21 1627

## 2021-05-10 NOTE — ED Notes (Signed)
Patient transported to CT 

## 2021-05-10 NOTE — H&P (Addendum)
History and Physical    Karina Clarke HAL:[id number] DOB: [birth year] DOA: 05/10/2021  PCP: Rosita Fire, MD   Patient coming from: [facility]  I have personally briefly reviewed patient's old medical records in [place]  Chief Complaint: AMS  HPI: Karina Clarke is a 60 y.o. female with medical history significant for schizophrenia, HIV, COPD.  Patient was brought from nursing home with reports of unresponsiveness, with possible overdose.  The time of my evaluation, patient is somnolent.   Per chart review-on EMS arrival, patient was unresponsive, 2 mg of Narcan was given, patient started to come around and respond, speech was garbled. I talked to staff - Rise Paganini at nursing home with family and with patient. The only medication patient takes for pain is Tylenol, patient is not on Norco or any opiates.  Patient at baseline is awake alert very conversant, and opinionated, talks a lot.  Ambulates with a walker.  Patient talked to EMS on arrival, but dropped back to sleep. Today, patient was very sleepy, she vomited her pills, refused to eat.  Garbled speech was not noted.  Prior to today, patient has been at her baseline, no complaints, no vomiting no loose stools appetite is good.   ED Course: temperature 99.8.  Heart rate 97-112, respiratory rate 23-37, blood pressure systolic 73-532.  O2 sats 90 to 96% on 2 L.  Lactic acidosis of 2> 1.7.  WBC 5.4.  Troponin 331.  UA- Shows small leukocytes large hemoglobin.  Creatinine elevated 5.15.  BNP 83.  Enzymes elevated AST 913, ALT 272, with normal ALP and total bilirubin.  Head CT without acute abnormality.  Chest x-ray suggest pneumonia, versus aspiration. 3 L bolus given. IV NArcan 2mg  given without significant improvement.  IV ceftriaxone and azithromycin started. Hospitalist to admit.   Review of Systems: Unable to assess due to altered mental status.  Past Medical History:  Diagnosis Date   Atypical lobular hyperplasia of  left breast 06/2015   Bloating    per sister   Chronic right hip pain    sister states circulation problem in hip   Family history of adverse reaction to anesthesia    sister has hx. of post-op N/V   High cholesterol    HIV (human immunodeficiency virus infection) ([place])    Hypertension    Limp    Mild intellectual disability    No natural teeth    does not wear her dentures   Paranoid schizophrenia ([place])    Personality disorder ([place])    Schizophrenia ([place])     Past Surgical History:  Procedure Laterality Date   BREAST LUMPECTOMY WITH NEEDLE LOCALIZATION Left 07/28/2015   Procedure: BREAST LUMPECTOMY WITH NEEDLE LOCALIZATION;  Surgeon: Autumn Messing III, MD;  Location: [facility];  Service: General;  Laterality: Left;   COLONOSCOPY WITH PROPOFOL N/A 06/13/2015   SLF: 1. the left colon is redundant 2. the examination was otherwise normal 3. small internal hemorrhoids 4. moderate sized external hemorrhoids   HEMORRHOID SURGERY     UMBILICAL HERNIA REPAIR       reports that she has been smoking cigarettes. She has a 18.00 pack-year smoking history. She has never used smokeless tobacco. She reports that she does not drink alcohol and does not use drugs.  Allergies  Allergen Reactions   Asa [Aspirin] Nausea And Vomiting    Family History  Problem Relation Age of Onset   Anesthesia problems Sister        post-op N/V  Diabetes Father     Prior to Admission medications   Medication Sig Start Date End Date Taking? Authorizing Provider  abacavir-lamiVUDine (EPZICOM) 600-300 MG per tablet Take 1 tablet by mouth daily.   Yes [provider]  acetaminophen (TYLENOL) 650 MG CR tablet Take 650 mg by mouth every 8 (eight) hours as needed for pain.   Yes [provider]  albuterol (PROVENTIL HFA;VENTOLIN HFA) 108 (90 Base) MCG/ACT inhaler Inhale 1-2 puffs into the lungs 2 (two) times daily as needed for wheezing or shortness of breath.   Yes [provider]  ALPRAZolam Duanne Moron) 0.5 MG tablet Take 0.5 mg by mouth 2 (two) times daily. 02/01/20  Yes [provider]  atazanavir (REYATAZ) 200 MG capsule Take 400 mg by mouth daily.    Yes [provider]  atorvastatin (LIPITOR) 10 MG tablet Take 20 mg by mouth daily.   Yes [provider]  divalproex (DEPAKOTE) 500 MG DR tablet Take 500 mg by mouth 2 (two) times daily. Take 1 tablet (500 mg) by mouth at 4pm and 8pm daily   Yes [provider]  haloperidol (HALDOL) 10 MG tablet Take 20 mg by mouth 3 (three) times daily.    Yes [provider]  haloperidol decanoate (HALDOL DECANOATE) 100 MG/ML injection Inject 200 mg into the muscle every 28 (twenty-eight) days. Last injection 06/09/15   Yes [provider]  hydrOXYzine (VISTARIL) 25 MG capsule Take 25 mg by mouth at bedtime.    Yes [provider]  insulin detemir (LEVEMIR) 100 UNIT/ML injection Inject 0.33 mLs (33 Units total) into the skin at bedtime. 10/27/17  Yes Isaac Bliss, Rayford Halsted, MD  Magnesium Oxide 400 MG CAPS Take 1 capsule (400 mg total) by mouth every morning. 08/10/17  Yes Reyne Dumas, MD  metoprolol tartrate (LOPRESSOR) 25 MG tablet Take 1 tablet by mouth 2 (two) times daily. 05/03/21  Yes [provider]  polyethylene glycol (MIRALAX / GLYCOLAX) packet Take 17 g by mouth daily as needed for mild constipation.    Yes [provider]  QUEtiapine (SEROQUEL) 200 MG tablet Take 200 mg by mouth 2 (two) times daily. Take 1 tablet (200 mg) by mouth with supper and at bedtime daily   Yes [provider]  raltegravir (ISENTRESS) 400 MG tablet Take 400 mg by mouth 2 (two) times daily.   Yes [provider]  trihexyphenidyl (ARTANE) 2 MG tablet Take 2 mg by mouth 2 (two) times daily with a meal.   Yes [provider]  valACYclovir (VALTREX) 1000 MG tablet Take 1,000 mg by mouth daily.    Yes [provider]  zolpidem (AMBIEN)  10 MG tablet Take 5 mg by mouth at bedtime.    Yes [provider]  HYDROcodone-acetaminophen (NORCO/VICODIN) 5-325 MG tablet One tablet every four hours as needed for acute pain.  Limit of five days per [place]. Patient not taking: No sig reported 07/22/18   Sanjuana Kava, MD  ibuprofen (ADVIL,MOTRIN) 400 MG tablet Take 400 mg by mouth every 8 (eight) hours as needed for moderate pain. Patient not taking: No sig reported    [provider]  insulin aspart (NOVOLOG) 100 UNIT/ML injection Inject 0-20 Units into the skin 3 (three) times daily with meals. Patient not taking: No sig reported    [provider]    Physical Exam:Limited due to altered mental status. Vitals:   05/10/21 1420 05/10/21 1430 05/10/21 1510 05/10/21 1600  BP: 115/81  125/74 124/77 116/70  Pulse:  (!) 105 97 99  Resp: (!) 23 (!) 25 (!) 26 (!) 26  Temp:      TempSrc:      SpO2:  93% 96% 94%  Weight:      Height:        Constitutional: Somnolent, deeply asleep, grunted in response to voice, responds to sternal rub - yelling loudly that I should leave her alone.  Vitals:   05/10/21 1420 05/10/21 1430 05/10/21 1510 05/10/21 1600  BP: 115/81 125/74 124/77 116/70  Pulse:  (!) 105 97 99  Resp: (!) 23 (!) 25 (!) 26 (!) 26  Temp:      TempSrc:      SpO2:  93% 96% 94%  Weight:      Height:       Eyes: PERRL, lids and conjunctivae normal ENMT: Mucous membranes are dry Neck: normal, supple, no masses, no thyromegaly Respiratory: clear to auscultation bilaterally, no wheezing, no crackles. Normal respiratory effort. No accessory muscle use.  Cardiovascular: Regular rate and rhythm, no murmurs / rubs / gallops. No extremity edema. 2+ pedal pulses.   Abdomen: Limited by mental status, but right upper quadrant tenderness equivocal On exam, no masses palpated. No hepatosplenomegaly. Bowel sounds positive.  Musculoskeletal: no clubbing / cyanosis. No joint deformity upper and lower  extremities. Good ROM, no contractures. Normal muscle tone.  Skin: no rashes, lesions, ulcers. No induration Neurologic: No apparent facial asymmetry, speech confused, but clear, follow directions to keep her upper arms up, otherwise exam is limited.  Moving right lower extremity but not so much left lower extremity.  Barely cooperative with exam. Psychiatric: Somnolent, unable to assess.  Labs on Admission: I have personally reviewed following labs and imaging studies  CBC: Recent Labs  Lab 05/10/21 1231  WBC 5.4  NEUTROABS 3.0  HGB 14.9  HCT 45.8  MCV 97.9  PLT 90*   Basic Metabolic Panel: Recent Labs  Lab 05/10/21 1424  NA 138  K 4.7  CL 102  CO2 22  GLUCOSE 97  BUN 83*  CREATININE 5.15*  CALCIUM 7.8*   Liver Function Tests: Recent Labs  Lab 05/10/21 1424  AST 913*  ALT 272*  ALKPHOS 82  BILITOT 0.3  PROT 7.2  ALBUMIN 3.3*    Recent Labs  Lab 05/10/21 1424  AMMONIA 20   Coagulation Profile: Recent Labs  Lab 05/10/21 1424  INR 1.0   Urine analysis:    Component Value Date/Time   COLORURINE YELLOW 05/10/2021 1231   APPEARANCEUR CLEAR 05/10/2021 1231   LABSPEC >1.030 (H) 05/10/2021 1231   PHURINE 5.5 05/10/2021 1231   GLUCOSEU NEGATIVE 05/10/2021 1231   HGBUR LARGE (A) 05/10/2021 1231   BILIRUBINUR SMALL (A) 05/10/2021 1231   KETONESUR NEGATIVE 05/10/2021 1231   PROTEINUR 30 (A) 05/10/2021 1231   NITRITE NEGATIVE 05/10/2021 1231   LEUKOCYTESUR SMALL (A) 05/10/2021 1231    Radiological Exams on Admission: CT Head Wo Contrast  Result Date: 05/10/2021 CLINICAL DATA:  Unresponsive prior to arrival. Question intracranial hemorrhage. EXAM: CT HEAD WITHOUT CONTRAST TECHNIQUE: Contiguous axial images were obtained from the base of the skull through the vertex without intravenous contrast. COMPARISON:  None. FINDINGS: Brain: Brainstem appears normal. No focal cerebellar finding. Cerebral hemispheres show moderate chronic small-vessel ischemic changes of  the white matter. No sign of acute infarction, mass lesion, hemorrhage, hydrocephalus or extra-axial collection. Vascular: There is atherosclerotic calcification of the major vessels at the base of the brain. Skull: Negative  Sinuses/Orbits: Clear/normal Other: None IMPRESSION: No acute finding by CT. Moderate chronic small-vessel ischemic changes of the cerebral hemispheric white matter. Electronically Signed   By: Nelson Chimes M.D.   On: 05/10/2021 13:56   DG Chest Port 1 View  Result Date: 05/10/2021 CLINICAL DATA:  Unresponsive with concern for drug overdose EXAM: PORTABLE CHEST 1 VIEW COMPARISON:  January 27, 2018 chest radiograph. Chest CT October 17, 2017 FINDINGS: There are areas of ill-defined airspace opacity in right mid and lower lung regions. Lungs elsewhere are clear. Heart is upper normal in size, stable, with prominence of central pulmonary arteries, consistent with pulmonary arterial hypertension. No adenopathy appreciable. No bone lesions IMPRESSION: Areas of ill-defined opacity right mid and lower lung regions. Suspect early pneumonia or possible foci of aspiration. Lungs elsewhere clear. Heart upper normal in size with evidence of pulmonary arterial hypertension. Note that pulmonary arterial hypertension was present on prior CT examination. Electronically Signed   By: Lowella Grip III M.D.   On: 05/10/2021 13:23    EKG: Independently reviewed.  Sinus tachycardia rate 111.  QTc 454.  No significant ST or T wave changes from prior.  Assessment/Plan Principal Problem:   AMS (altered mental status) Active Problems:   AKI (acute kidney injury) ([place])   PNA (pneumonia)   Transaminitis   HIV (human immunodeficiency virus infection) ([place])   Schizophrenia ([place])   Diabetes mellitus, new onset ([place])   Acute Metabolic encephalopathy-somnolent, confused.  Per EMS possible overdose, mental status improved with Narcan.  But UDS is negative, and patient is just on Tylenol only for pain.  Head  CT without acute abnormality.  Etiology likely multifactorial, with pneumonia on chest x-ray, elevated liver enzymes, and azotemia from significant acute kidney injury. -N.p.o. for now - hold home psycho active meds- seroq, xanax, haloperidol, depakote, zolpidem -Reported garbled speech not evident on my exam, more of confusion, may need further imaging if concern for stroke. - Check Depakote level - Unable to obtain ABG after 4 sticks, so VBG was done, and is unremarkable.  Acute kidney injury-creatinine 5.15, baseline 1-1.3.  With BNP of 83.  UA shows large hemoglobin, 30 protein. - 3 L sepsis fluid bolus given, continue N/s 100cc/hr -Insert Foley -If no significant improvement in renal function, consider nephrology consult  Pneumonia with Severe sepsis-chest xray suggest early pneumonia or possible foci of aspiration.  O2 sats Patient vomited once today arrival.  With criteria for sepsis with tachycardia of 112, tachypnea up to 37.  Blood pressure initially 97/80.  Lactic acidosis of 2 > 1.7.  With evidence of endorgan dysfunction- encephalopathy, thrombocytopenia.  O2 sats greater than 90 - 99 % on 2 L. - IV unasyn, IV azithromycin  Transaminitis -AST 913, ALT 272.  With normal ALP 82, normal T bili 0.9.  Ammonia level unremarkable. INR- 1. ALb- 3.3. Doubt degree of sepsis would account for the degree of liver injury.  Lowest blood pressure 97/80 and has been in the 130s since. -Obtain CT abdomen-Liver unremarkable. - Check acute hepatitis panel  -  Tylenol level , now and in 4hrs.  - Hold statins -Talked to gastroenterologist Dr. Abbey Chatters, recommended rechecking INR, and if liver function appears to be worsening, then start IV NAC. -With uncertain etiology for transaminitis, hold all HIV medications -Nursing home resident, doubt alcohol intake  Elevated Troponin- 313. EKG unremarkable. ? Demand ischemia. No cardiac hx. - Trend.  - Obtain ECHO  Thrombocytopenia- 90.  In setting of  sepsis.  LAst check Platelets  2019 was 188- 230s. ? Sepsis. -Trend.  Possible UTI-UA small leukocytes,.  Few bacteria - Follow up urine Cultures  HIV-HIV viral load not detected 12/2020, stable CD4 count. -Hold HIV medications for now while n.p.o. and with significant drop in GFR  Schizophrenia-  -Hold haloperidol, Depakote,  HTN-stable.  DM-random glucose 97.  - SSI- S q6h  -Hold home Levemir - Hgba1c  DVT prophylaxis: SCDs Code Status: Full code Family Communication: None at bedside Disposition Plan:  > 2 days Consults called: GI  Admission status: Inpt, tele  I certify that at the point of admission it is my clinical judgment that the patient will require inpatient hospital care spanning beyond 2 midnights from the point of admission due to high intensity of service, high risk for further deterioration and high frequency of surveillance required.    Bethena Roys MD [facility]  05/10/2021, 8:39 PM

## 2021-05-10 NOTE — Sepsis Progress Note (Signed)
Monitored for code sepsis protocol by eLink.

## 2021-05-10 NOTE — Progress Notes (Signed)
Pharmacy Antibiotic Note  Karina Clarke is a 60 y.o. female admitted on 05/10/2021 with possible aspiration pneumonia 2/2 vomiting s/p possible overdose. Pharmacy has been consulted for ampicillin/sulbactam dosing.  Plan: The dose of Unasyn will be adjusted to 3gm IV q24h based on renal function.  Height: 5\' 8"  (172.7 cm) Weight: 99.8 kg (220 lb) IBW/kg (Calculated) : 63.9  Temp (24hrs), Avg:99.8 F (37.7 C), Min:99.8 F (37.7 C), Max:99.8 F (37.7 C)  Recent Labs  Lab 05/10/21 1231 05/10/21 1424  WBC 5.4  --   CREATININE  --  5.15*  LATICACIDVEN 2.0* 1.7    Estimated Creatinine Clearance: 14.9 mL/min (A) (by C-G formula based on SCr of 5.15 mg/dL (H)).    Allergies  Allergen Reactions   Asa [Aspirin] Nausea And Vomiting    Antimicrobials this admission: azithromycin 500mg  IV q24h 6/16 >>   Dose adjustments this admission: N/a  Microbiology results: 6/16 BCx: pending 6/16 UCx: pending   Thank you for allowing pharmacy to be a part of this patient's care.  [place] 05/10/2021 7:04 PM

## 2021-05-10 NOTE — ED Notes (Signed)
Unable to sign MSE pt with AMS

## 2021-05-10 NOTE — ED Triage Notes (Signed)
Pt presents to ED via [facility] for possible OD. Pt was unresponsive, snoring respirations upon EMS arrival, pin point pupils. Narcan 2 mg given, pt started to come around and respond, started to have garbled speech. Pt from [facility].

## 2021-05-10 NOTE — ED Notes (Signed)
Critical value Trop 396  Reported to Hospitalist [place] at this time. No new orders . Trend troponins .

## 2021-05-11 ENCOUNTER — Inpatient Hospital Stay (HOSPITAL_COMMUNITY): Payer: Medicaid Other

## 2021-05-11 DIAGNOSIS — R778 Other specified abnormalities of plasma proteins: Secondary | ICD-10-CM

## 2021-05-11 DIAGNOSIS — R400 Somnolence: Secondary | ICD-10-CM | POA: Diagnosis not present

## 2021-05-11 LAB — ECHOCARDIOGRAM COMPLETE
AR max vel: 2.12 cm2
AV Area VTI: 2.35 cm2
AV Area mean vel: 2.49 cm2
AV Mean grad: 5.2 mmHg
AV Peak grad: 10 mmHg
Ao pk vel: 1.58 m/s
Area-P 1/2: 5.46 cm2
Height: 68 in
S' Lateral: 2.39 cm
Weight: 3312.19 oz

## 2021-05-11 LAB — COMPREHENSIVE METABOLIC PANEL
ALT: 224 U/L — ABNORMAL HIGH (ref 0–44)
AST: 729 U/L — ABNORMAL HIGH (ref 15–41)
Albumin: 2.8 g/dL — ABNORMAL LOW (ref 3.5–5.0)
Alkaline Phosphatase: 67 U/L (ref 38–126)
Anion gap: 10 (ref 5–15)
BUN: 70 mg/dL — ABNORMAL HIGH (ref 6–20)
CO2: 20 mmol/L — ABNORMAL LOW (ref 22–32)
Calcium: 7.7 mg/dL — ABNORMAL LOW (ref 8.9–10.3)
Chloride: 111 mmol/L (ref 98–111)
Creatinine, Ser: 3.36 mg/dL — ABNORMAL HIGH (ref 0.44–1.00)
GFR, Estimated: 15 mL/min — ABNORMAL LOW (ref >60)
Glucose, Bld: 84 mg/dL (ref 70–99)
Potassium: 4.8 mmol/L (ref 3.5–5.1)
Sodium: 141 mmol/L (ref 135–145)
Total Bilirubin: 0.3 mg/dL (ref 0.3–1.2)
Total Protein: 6.1 g/dL — ABNORMAL LOW (ref 6.5–8.1)

## 2021-05-11 LAB — CBC
HCT: 38.5 % (ref 36.0–46.0)
Hemoglobin: 12.3 g/dL (ref 12.0–15.0)
MCH: 32.1 pg (ref 26.0–34.0)
MCHC: 31.9 g/dL (ref 30.0–36.0)
MCV: 100.5 fL — ABNORMAL HIGH (ref 80.0–100.0)
Platelets: 80 10*3/uL — ABNORMAL LOW (ref 150–400)
RBC: 3.83 MIL/uL — ABNORMAL LOW (ref 3.87–5.11)
RDW: 15.9 % — ABNORMAL HIGH (ref 11.5–15.5)
WBC: 6.7 10*3/uL (ref 4.0–10.5)
nRBC: 0 % (ref 0.0–0.2)

## 2021-05-11 LAB — GLUCOSE, CAPILLARY
Glucose-Capillary: 51 mg/dL — ABNORMAL LOW (ref 70–99)
Glucose-Capillary: 105 mg/dL — ABNORMAL HIGH (ref 70–99)
Glucose-Capillary: 59 mg/dL — ABNORMAL LOW (ref 70–99)
Glucose-Capillary: 116 mg/dL — ABNORMAL HIGH (ref 70–99)
Glucose-Capillary: 55 mg/dL — ABNORMAL LOW (ref 70–99)

## 2021-05-11 LAB — PROCALCITONIN: Procalcitonin: 7.79 ng/mL

## 2021-05-11 LAB — AMMONIA: Ammonia: 30 umol/L (ref 9–35)

## 2021-05-11 LAB — PROTIME-INR
INR: 1.1 (ref 0.8–1.2)
Prothrombin Time: 13.9 seconds (ref 11.4–15.2)

## 2021-05-11 LAB — HEPATITIS PANEL, ACUTE
HCV Ab: NONREACTIVE
Hep A IgM: NONREACTIVE
Hep B C IgM: NONREACTIVE
Hepatitis B Surface Ag: NONREACTIVE

## 2021-05-11 LAB — FERRITIN: Ferritin: 604 ng/mL — ABNORMAL HIGH (ref 11–307)

## 2021-05-11 LAB — IRON AND TIBC
Iron: 32 ug/dL (ref 28–170)
Saturation Ratios: 15 % (ref 10.4–31.8)
TIBC: 220 ug/dL — ABNORMAL LOW (ref 250–450)
UIBC: 188 ug/dL

## 2021-05-11 LAB — CBG MONITORING, ED: Glucose-Capillary: 93 mg/dL (ref 70–99)

## 2021-05-11 MED ORDER — CHLORHEXIDINE GLUCONATE CLOTH 2 % EX PADS
6.0000 | MEDICATED_PAD | Freq: Every day | CUTANEOUS | Status: DC
  Administered 2021-05-11 – 2021-05-14 (×4): 6 via TOPICAL

## 2021-05-11 MED ORDER — DEXTROSE 50 % IV SOLN: 12.5000 g | INTRAVENOUS | Status: AC

## 2021-05-11 MED ORDER — SODIUM CHLORIDE 0.9 % IV SOLN
3.0000 g | Freq: Two times a day (BID) | INTRAVENOUS | Status: DC
  Administered 2021-05-11 – 2021-05-12 (×3): 3 g via INTRAVENOUS
  Filled 2021-05-11 (×7): qty 8

## 2021-05-11 MED ORDER — DEXTROSE 50 % IV SOLN
INTRAVENOUS | Status: AC
  Filled 2021-05-11: qty 50

## 2021-05-11 MED ORDER — DEXTROSE-NACL 5-0.9 % IV SOLN: INTRAVENOUS | Status: DC

## 2021-05-11 NOTE — Progress Notes (Signed)
Pharmacy Antibiotic Note  Gwenith Tschida is a 60 y.o. female admitted on 05/10/2021 with possible aspiration pneumonia 2/2 vomiting s/p possible overdose. Pharmacy has been consulted for ampicillin/sulbactam dosing. Adjust Unasyn for improved Scr  Plan: Increase Unasyn 3gIV q12h Monitor V/S, labs and cultures  Height: 5\' 8"  (172.7 cm) Weight: 99.8 kg (220 lb) IBW/kg (Calculated) : 63.9  Temp (24hrs), Avg:99.8 F (37.7 C), Min:99.8 F (37.7 C), Max:99.8 F (37.7 C)  Recent Labs  Lab 05/10/21 1231 05/10/21 1424 05/10/21 2254 05/11/21 0418  WBC 5.4  --   --  6.7  CREATININE  --  5.15* 4.01* 3.36*  LATICACIDVEN 2.0* 1.7  --   --     Normalized CrCL 41mls/min Estimated Creatinine Clearance: 22.8 mL/min (A) (by C-G formula based on SCr of 3.36 mg/dL (H)).    Allergies  Allergen Reactions   Asa [Aspirin] Nausea And Vomiting    Antimicrobials this admission: Unasyn 6/16>> azithromycin 500mg  IV q24h 6/16 >>   Dose adjustments this admission: N/a  Microbiology results: 6/16 BCx: ngtd 6/16 UCx: pending   Thank you for allowing pharmacy to be a part of this patient's care.  Isac Sarna, BS [place], [place] Clinical Pharmacist Pager [phone]  05/11/2021 11:52 AM

## 2021-05-11 NOTE — Consult Note (Signed)
_0 @   Referring Provider: [facility] hospitalist Primary Care Physician:  Rosita Fire, MD Primary Gastroenterologist:  Dr. Abbey Chatters  Date of Admission: 05/10/2021 Date of Consultation: 05/11/2021  Reason for Consultation: Transaminitis  HPI:  Karina Clarke is a 60 y.o. year old female with medical history significant for schizophrenia, HIV, COPD who was brought from [facility] with reports of unresponsiveness, possible overdose. Per chart review-on EMS arrival, patient was unresponsive, 2 mg of Narcan was given, patient started to come around and respond, speech was garbled. Admitting hospitalist spoke with staff at [facility], family, and patient and verify that patient only takes Tylenol for pain.  She is not on Norco or any opiates.  At baseline, patient is awake, alert, conversant, and ambulates with walker.  ED Course: temperature 99.8.  Heart rate 97-112, respiratory rate 23-37, blood pressure systolic 43-154.  O2 sats 90 to 96% on 2 L.  Lactic acidosis of 2> 1.7.  WBC 5.4, hemoglobin 14.9.  Troponin 331.  BNP 21.  EKG with no significant ST or T wave changes. UA- Shows small leukocytes large hemoglobin.  Creatinine elevated 5.15, BUN 83.  Enzymes elevated AST 913, ALT 272, with normal ALP and total bilirubin.  Ammonia 20.  INR 1.0.  UDS positive for benzos.  Tylenol level <10.  Valproic acid level low at 33.  Head CT without acute abnormality.  Chest x-ray suggest pneumonia, versus aspiration. 3 L bolus given. IV NArcan 55m given without significant improvement.  IV ceftriaxone and azithromycin started.  Hospitalist ordered CT A/P without contrast with no hepatic or biliary abnormalities, pancreas normal, spleen normal.  Trace free fluid in the pelvic cul-de-sac.  Hepatitis panel negative.  Repeat Tylenol level <10.  Today:  Unable to obtain much history from patient today due to altered mental status.  She denies abdominal pain, nausea, vomiting, chest pain, shortness  of breath though it is not clear if this is reliable information.  She is able to tell me her first and last name and date of birth, but otherwise she is not oriented.  Tried calling [id number] this morning to obtain additional information, but no answer.   Spoke with nursing staff.  No acute events since admission.  She has remained somnolent since presenting to the emergency room, but will arouse when spoken to.  Past Medical History:  Diagnosis Date   Atypical lobular hyperplasia of left breast 06/2015   Bloating    per sister   Chronic right hip pain    sister states circulation problem in hip   Family history of adverse reaction to anesthesia    sister has hx. of post-op N/V   High cholesterol    [place] virus infection[place]    Hypertension    Limp    Mild intellectual disability    No natural teeth    does not wear her dentures   Paranoid schizophrenia ([place]    [place]ality disorder [place]    Schizophrenia ([place]     Past Surgical History:  Procedure Laterality Date   BREAST LUMPECTOMY WITH NEEDLE LOCALIZATION Left 07/28/2015   Procedure: BREAST LUMPECTOMY WITH NEEDLE LOCALIZATION;  Surgeon: PAutumn MessingIII, MD;  [facility]  Service: General;  Laterality: Left;   COLONOSCOPY WITH PROPOFOL N/A 06/13/2015   SLF: 1. the left colon is redundant 2. the examination was otherwise normal 3. small internal hemorrhoids 4. moderate sized external hemorrhoids   HEMORRHOID SURGERY     UMBILICAL  HERNIA REPAIR      Prior to Admission medications   Medication Sig Start Date End Date Taking? Authorizing Provider  abacavir-lamiVUDine (EPZICOM) 600-300 MG per tablet Take 1 tablet by mouth daily.   Yes [provider]  acetaminophen (TYLENOL) 650 MG CR tablet Take 650 mg by mouth every 8 (eight) hours as needed for pain.   Yes [provider]  albuterol (PROVENTIL HFA;VENTOLIN HFA) 108 (90 Base) MCG/ACT inhaler Inhale 1-2  puffs into the lungs 2 (two) times daily as needed for wheezing or shortness of breath.   Yes [provider]  ALPRAZolam Duanne Moron) 0.5 MG tablet Take 0.5 mg by mouth 2 (two) times daily. 02/01/20  Yes [provider]  atazanavir (REYATAZ) 200 MG capsule Take 400 mg by mouth daily.    Yes [provider]  atorvastatin (LIPITOR) 10 MG tablet Take 20 mg by mouth daily.   Yes [provider]  divalproex (DEPAKOTE) 500 MG DR tablet Take 500 mg by mouth 2 (two) times daily. Take 1 tablet (500 mg) by mouth at 4pm and 8pm daily   Yes [provider]  haloperidol (HALDOL) 10 MG tablet Take 20 mg by mouth 3 (three) times daily.    Yes [provider]  haloperidol decanoate (HALDOL DECANOATE) 100 MG/ML injection Inject 200 mg into the muscle every 28 (twenty-eight) days. Last injection 06/09/15   Yes [provider]  hydrOXYzine (VISTARIL) 25 MG capsule Take 25 mg by mouth at bedtime.    Yes [provider]  insulin detemir (LEVEMIR) 100 UNIT/ML injection Inject 0.33 mLs (33 Units total) into the skin at bedtime. 10/27/17  Yes Isaac Bliss, Rayford Halsted, MD  Magnesium Oxide 400 MG CAPS Take 1 capsule (400 mg total) by mouth every morning. 08/10/17  Yes Reyne Dumas, MD  metoprolol tartrate (LOPRESSOR) 25 MG tablet Take 1 tablet by mouth 2 (two) times daily. 05/03/21  Yes [provider]  polyethylene glycol (MIRALAX / GLYCOLAX) packet Take 17 g by mouth daily as needed for mild constipation.    Yes [provider]  QUEtiapine (SEROQUEL) 200 MG tablet Take 200 mg by mouth 2 (two) times daily. Take 1 tablet (200 mg) by mouth with supper and at bedtime daily   Yes [provider]  raltegravir (ISENTRESS) 400 MG tablet Take 400 mg by mouth 2 (two) times daily.   Yes [provider]  trihexyphenidyl (ARTANE) 2 MG tablet Take 2 mg by mouth 2 (two) times daily with a meal.   Yes [provider]  valACYclovir  (VALTREX) 1000 MG tablet Take 1,000 mg by mouth daily.    Yes [provider]  zolpidem (AMBIEN) 10 MG tablet Take 5 mg by mouth at bedtime.    Yes [provider]  HYDROcodone-acetaminophen (NORCO/VICODIN) 5-325 MG tablet One tablet every four hours as needed for acute pain.  Limit of five days per [place] statue. Patient not taking: No sig reported 07/22/18   Sanjuana Kava, MD  ibuprofen (ADVIL,MOTRIN) 400 MG tablet Take 400 mg by mouth every 8 (eight) hours as needed for moderate pain. Patient not taking: No sig reported    [provider]  insulin aspart (NOVOLOG) 100 UNIT/ML injection Inject 0-20 Units into the skin 3 (three) times daily with meals. Patient not taking: No sig reported    [provider]    Current Facility-Administered Medications  Medication Dose Route Frequency Provider Last Rate Last Admin   0.9 %  sodium chloride  infusion   Intravenous Continuous Emokpae, Ejiroghene E, MD 100 mL/hr at 05/11/21 0931 New Bag at 05/11/21 0931   Ampicillin-Sulbactam (UNASYN) 3 g in sodium chloride 0.9 % 100 mL IVPB  3 g Intravenous Q24H Emokpae, Ejiroghene E, MD   Stopped at 05/11/21 0612   azithromycin (ZITHROMAX) 500 mg in sodium chloride 0.9 % 250 mL IVPB  500 mg Intravenous Q24H Emokpae, Ejiroghene E, MD   Stopped at 05/10/21 1610   insulin aspart (novoLOG) injection 0-9 Units  0-9 Units Subcutaneous Q6H Emokpae, Ejiroghene E, MD       ondansetron (ZOFRAN) tablet 4 mg  4 mg Oral Q6H PRN Emokpae, Ejiroghene E, MD       Or   ondansetron (ZOFRAN) injection 4 mg  4 mg Intravenous Q6H PRN Emokpae, Ejiroghene E, MD       polyethylene glycol (MIRALAX / GLYCOLAX) packet 17 g  17 g Oral Daily PRN Emokpae, Ejiroghene E, MD       Current Outpatient Medications  Medication Sig Dispense Refill   abacavir-lamiVUDine (EPZICOM) 600-300 MG per tablet Take 1 tablet by mouth daily.     acetaminophen (TYLENOL) 650 MG CR tablet Take 650 mg by mouth every 8 (eight)  hours as needed for pain.     albuterol (PROVENTIL HFA;VENTOLIN HFA) 108 (90 Base) MCG/ACT inhaler Inhale 1-2 puffs into the lungs 2 (two) times daily as needed for wheezing or shortness of breath.     ALPRAZolam (XANAX) 0.5 MG tablet Take 0.5 mg by mouth 2 (two) times daily.     atazanavir (REYATAZ) 200 MG capsule Take 400 mg by mouth daily.      atorvastatin (LIPITOR) 10 MG tablet Take 20 mg by mouth daily.     divalproex (DEPAKOTE) 500 MG DR tablet Take 500 mg by mouth 2 (two) times daily. Take 1 tablet (500 mg) by mouth at 4pm and 8pm daily     haloperidol (HALDOL) 10 MG tablet Take 20 mg by mouth 3 (three) times daily.      haloperidol decanoate (HALDOL DECANOATE) 100 MG/ML injection Inject 200 mg into the muscle every 28 (twenty-eight) days. Last injection 06/09/15     hydrOXYzine (VISTARIL) 25 MG capsule Take 25 mg by mouth at bedtime.      insulin detemir (LEVEMIR) 100 UNIT/ML injection Inject 0.33 mLs (33 Units total) into the skin at bedtime. 10 mL 11   Magnesium Oxide 400 MG CAPS Take 1 capsule (400 mg total) by mouth every morning. 60 each 0   metoprolol tartrate (LOPRESSOR) 25 MG tablet Take 1 tablet by mouth 2 (two) times daily.     polyethylene glycol (MIRALAX / GLYCOLAX) packet Take 17 g by mouth daily as needed for mild constipation.      QUEtiapine (SEROQUEL) 200 MG tablet Take 200 mg by mouth 2 (two) times daily. Take 1 tablet (200 mg) by mouth with supper and at bedtime daily     raltegravir (ISENTRESS) 400 MG tablet Take 400 mg by mouth 2 (two) times daily.     trihexyphenidyl (ARTANE) 2 MG tablet Take 2 mg by mouth 2 (two) times daily with a meal.     valACYclovir (VALTREX) 1000 MG tablet Take 1,000 mg by mouth daily.      zolpidem (AMBIEN) 10 MG tablet Take 5 mg by mouth at bedtime.      HYDROcodone-acetaminophen (NORCO/VICODIN) 5-325 MG tablet One tablet every four hours as needed for acute pain.  Limit of five days per [place] statue. (  Patient not taking: No sig reported)  30 tablet 0   ibuprofen (ADVIL,MOTRIN) 400 MG tablet Take 400 mg by mouth every 8 (eight) hours as needed for moderate pain. (Patient not taking: No sig reported)     insulin aspart (NOVOLOG) 100 UNIT/ML injection Inject 0-20 Units into the skin 3 (three) times daily with meals. (Patient not taking: No sig reported)      Allergies as of 05/10/2021 - Review Complete 05/10/2021  Allergen Reaction Noted   Asa [aspirin] Nausea And Vomiting 06/08/2015    Family History  Problem Relation Age of Onset   Anesthesia problems Sister        post-op N/V   Diabetes Father     Social History   Socioeconomic History   Marital status: Single    Spouse name: Not on file   Number of children: Not on file   Years of education: Not on file   Highest education level: Not on file  Occupational History   Occupation: disabled  Tobacco Use   Smoking status: Every Day    Packs/day: 1.00    Years: 18.00    Pack years: 18.00    Types: Cigarettes   Smokeless tobacco: Never  Substance and Sexual Activity   Alcohol use: No    Alcohol/week: 0.0 standard drinks   Drug use: No   Sexual activity: Never    Birth control/protection: Post-menopausal  Other Topics Concern   Not on file  Social History Narrative   Not on file   Social Determinants of Health   Financial Resource Strain: Not on file  Food Insecurity: Not on file  Transportation Needs: Not on file  Physical Activity: Not on file  Stress: Not on file  Social Connections: Not on file  Intimate Partner Violence: Not on file    Review of Systems: Limited information obtainable from patient due to altered mental status.  See HPI.  Physical Exam: Vital signs in last 24 hours: Temp:  [99.8 F (37.7 C)] 99.8 F (37.7 C) (06/16 1230) Pulse Rate:  [93-112] 100 (06/17 1000) Resp:  [15-37] 21 (06/17 1000) BP: (97-144)/(63-92) 115/73 (06/17 1000) SpO2:  [90 %-99 %] 94 % (06/17 1000) Weight:  [99.8 kg] 99.8 kg (06/16 1230)   General:    Somnolent but arousable.  Resting comfortably in bed in no acute distress.  On 2 L nasal cannula.  Well-developed and well-nourished. Head:  Normocephalic and atraumatic. Eyes:  Sclera clear, no icterus.   Ears:  Normal auditory acuity. Lungs:  Clear to auscultation anteriorly.  Unable to assess posterior lung fields. Heart:  Regular rate and rhythm; no murmurs, clicks, rubs,  or gallops. Abdomen: Full, soft, nontender. No masses, hepatosplenomegaly or hernias noted. Normal bowel sounds, without guarding, and without rebound.   Rectal:  Deferred  Msk:  Symmetrical without gross deformities. Normal posture. Extremities:  Without edema. Neurologic: Oriented to self only.  Able to tell me her first and last name and date of birth.  She will follow commands to open her eyes, squeeze my hands, and wiggle her toes. Skin:  Intact without significant lesions or rashes. Psych:  Normal mood and affect.  Intake/Output from previous day: 06/16 0701 - 06/17 0700 In: 1350 [IV Piggyback:1350] Out: 750 [Urine:750] Intake/Output this shift: Total I/O In: -  Out: 500 [Urine:500]  Lab Results: Recent Labs    05/10/21 1231 05/11/21 0418  WBC 5.4 6.7  HGB 14.9 12.3  HCT 45.8 38.5  PLT 90* 80*   BMET  Recent Labs    05/10/21 1424 05/10/21 2254 05/11/21 0418  NA 138 139 141  K 4.7 4.7 4.8  CL 102 108 111  CO2 22 23 20*  GLUCOSE 97 99 84  BUN 83* 74* 70*  CREATININE 5.15* 4.01* 3.36*  CALCIUM 7.8* 7.6* 7.7*   LFT Recent Labs    05/10/21 1424 05/10/21 2254 05/11/21 0418  PROT 7.2 6.5 6.1*  ALBUMIN 3.3* 3.0* 2.8*  AST 913* 776* 729*  ALT 272* 242* 224*  ALKPHOS 82 75 67  BILITOT 0.3 0.1* 0.3   PT/INR Recent Labs    05/10/21 1424 05/10/21 2254  LABPROT 13.6 13.2  INR 1.0 1.0   Studies/Results: CT ABDOMEN PELVIS WO CONTRAST  Result Date: 05/10/2021 CLINICAL DATA:  Lethargy. Abdominal pain. Elevated liver enzymes. Acute kidney injury. Altered mental status. History of  umbilical hernia. EXAM: CT ABDOMEN AND PELVIS WITHOUT CONTRAST TECHNIQUE: Multidetector CT imaging of the abdomen and pelvis was performed following the standard protocol without IV contrast. COMPARISON:  None. FINDINGS: Lower chest: Hypoventilatory changes at the dependent lung bases. Coronary atherosclerosis. Hepatobiliary: Normal liver size. No liver mass. Normal gallbladder with no radiopaque cholelithiasis. No biliary ductal dilatation. Pancreas: Normal, with no mass or duct dilation. Spleen: Normal size. No mass. Adrenals/Urinary Tract: Normal adrenals. No renal stones. No hydronephrosis. No contour deforming renal masses. Bladder collapsed by indwelling Foley catheter and grossly normal. Stomach/Bowel: Normal non-distended stomach. Normal caliber small bowel with no small bowel wall thickening. Normal appendix. Normal large bowel with no diverticulosis, large bowel wall thickening or pericolonic fat stranding. Vascular/Lymphatic: Atherosclerotic nonaneurysmal abdominal aorta. No pathologically enlarged lymph nodes in the abdomen or pelvis. Reproductive: Grossly normal uterus.  No adnexal mass. Other: No pneumoperitoneum. Trace free fluid in the pelvic cul-de-sac. No focal fluid collections. No evidence of a ventral abdominal hernia. Musculoskeletal: No aggressive appearing focal osseous lesions. IMPRESSION: 1. No acute abnormality. No hydronephrosis. No evidence of bowel obstruction or acute bowel inflammation. Normal appendix. 2. Trace free fluid in the pelvic cul-de-sac, nonspecific. No adnexal masses. 3. Coronary atherosclerosis. 4. Aortic Atherosclerosis (ICD10-I70.0). Electronically Signed   By: Ilona Sorrel M.D.   On: 05/10/2021 18:39   CT Head Wo Contrast  Result Date: 05/10/2021 CLINICAL DATA:  Unresponsive prior to arrival. Question intracranial hemorrhage. EXAM: CT HEAD WITHOUT CONTRAST TECHNIQUE: Contiguous axial images were obtained from the base of the skull through the vertex without  intravenous contrast. COMPARISON:  None. FINDINGS: Brain: Brainstem appears normal. No focal cerebellar finding. Cerebral hemispheres show moderate chronic small-vessel ischemic changes of the white matter. No sign of acute infarction, mass lesion, hemorrhage, hydrocephalus or extra-axial collection. Vascular: There is atherosclerotic calcification of the major vessels at the base of the brain. Skull: Negative Sinuses/Orbits: Clear/normal Other: None IMPRESSION: No acute finding by CT. Moderate chronic small-vessel ischemic changes of the cerebral hemispheric white matter. Electronically Signed   By: Nelson Chimes M.D.   On: 05/10/2021 13:56   DG Chest Port 1 View  Result Date: 05/10/2021 CLINICAL DATA:  Unresponsive with concern for drug overdose EXAM: PORTABLE CHEST 1 VIEW COMPARISON:  January 27, 2018 chest radiograph. Chest CT October 17, 2017 FINDINGS: There are areas of ill-defined airspace opacity in right mid and lower lung regions. Lungs elsewhere are clear. Heart is upper normal in size, stable, with prominence of central pulmonary arteries, consistent with pulmonary arterial hypertension. No adenopathy appreciable. No bone lesions IMPRESSION: Areas of ill-defined opacity right mid and lower lung regions. Suspect early pneumonia or possible  foci of aspiration. Lungs elsewhere clear. Heart upper normal in size with evidence of pulmonary arterial hypertension. Note that pulmonary arterial hypertension was present on prior CT examination. Electronically Signed   By: Lowella Grip III M.D.   On: 05/10/2021 13:23    Impression: 60 year old female with history significant for schizophrenia, HIV, COPD who was brought from [facility] due to reports of unresponsiveness with concern for possible overdose.  On arrival, EMS reported unresponsiveness, gave 2 mg of Narcan, and reported patient started to come around.  Received an additional dose of Narcan at the hospital with no significant  improvement.  She is now admitted with altered mental status, severe sepsis with pneumonia, AKI, transaminitis, elevated troponin, possible UTI.  Currently on IV fluids, IV antibiotics, and GI consulted due to transaminitis.  Transaminitis: Acutely elevated LFTs this admission with AST 913, ALT 272, alk phos 82, T bilirubin 0.3.  INR normal.  Previously, LFTs normal in February 2022.  CT A/P with no significant hepatic or biliary abnormalities.  Tylenol level less than 10.  Valproic acid level low at 33.  UDS positive for benzos, Xanax listed on outpatient med list.  Acute hepatitis panel negative.  LFTs improved to AST 729, ALT 224, alk phos and T bili remain normal.  Unable to obtain any significant history from patient.  Tried reaching out to [employer] multiple times this morning, but was not able to get in touch with anyone.  Per H&P, it was verified that patient is only taking Tylenol as needed for pain, no Norco or opiates.    Etiology is not clear.  Cannot rule out drug-induced liver injury. Per outpatient med list, she has a multiple HIV medications, Tylenol, atorvastatin, Depakote, Haldol Seroquel, all of which can influence elevated LFTs and it is unclear if any are new. These medications have been on hold since admission. Unable to get information on alcohol use, but less likely as she resides at [facility]. Sepsis also playing a role.  Unclear if there may also be a component of ischemia/decreased perfusion in the setting of elevated troponin and reported unresponsiveness outpatient. Notably, HIV RNA was not detected 4 months ago. Will screen for hemochromatosis, wilson's disease, and autoimmune hepatitis.  We will also check for other viral causes including CMV, HSV, EBV.  Altered mental status: CT head without acute abnormality.  Ammonia within normal limits.  UDS negative aside from benzos with Xanax prescription as outpatient.  This is likely multifactorial in the setting of  pneumonia, significant AKI with azotemia, elevated LFTs.  She remains somnolent, but is arousable.  Management per hospitalist.  Pneumonia: On antibiotics.  Management per hospitalist.  Elevated troponin: Troponin 331>> 396>> 360.  No acute ST or T wave changes on EKG.  Denies CP. Echo has been ordered.  Management per hospitalist.  Possible UTI: Management per hospitalist.  Culture pending.   Plan: Iron panel with ferritin, ceruloplasmin, ANA, AMA, ASMA, immunoglobulins, CMV, HSV, EBV. Follow-up on repeat Ammonia ordered today.  Continue to monitor LFTs and INR daily. Continue supportive measures.  Management of pneumonia, elevated troponin, and ?UTI per hospitalist.     LOS: 1 day    05/11/2021, 11:15 AM   Aliene Altes, PA-C [facility]

## 2021-05-11 NOTE — Progress Notes (Signed)
  Echocardiogram 2D Echocardiogram has been performed.  Karina Clarke 05/11/2021, 3:01 PM

## 2021-05-11 NOTE — Progress Notes (Addendum)
PROGRESS NOTE    Karina Clarke  GDJ:[id number] DOB: 1961/11/14 DOA: 05/10/2021 PCP: Rosita Fire, MD   Brief Narrative:   Karina Clarke is a 60 y.o. female with medical history significant for schizophrenia, HIV, COPD.  Patient was brought from nursing home with reports of unresponsiveness, with possible overdose.  She was given some Narcan prior to arrival as well as in the ED with some improvement in responsiveness.  She has been admitted with acute metabolic encephalopathy likely multifactorial in the setting of pneumonia, azotemia/AKI, and elevated liver enzymes.  GI consulted to assist with evaluation of liver injury.  Assessment & Plan:   Principal Problem:   AMS (altered mental status) Active Problems:   HIV (human immunodeficiency virus infection) ([place])   Schizophrenia (HCC)   Diabetes mellitus, new onset ([place])   AKI (acute kidney injury) ([place])   PNA (pneumonia)   Transaminitis   Acute metabolic encephalopathy-multifactorial -Currently n.p.o. given mental status -Head CT without acute findings -Continue to treat AKI as well as sepsis -UDS only positive for benzodiazepines with Xanax prescription outpatient  AKI-improving -Baseline 1-1.3 -Continue normal saline -Recheck a.m. labs -Monitor urine output  Severe sepsis secondary to pneumonia and also possible UTI -Check procalcitonin -Continue IV Unasyn and azithromycin due to suspicion of aspiration -Urine cultures pending  Transaminitis-stable -Possibly due to drug-induced liver injury versus ischemia -Appreciate GI evaluation -Labs ordered per GI  Elevated troponin -2D echocardiogram pending  Thrombocytopenia -Likely related to sepsis -Continue to monitor trend -Avoid heparin agents  HIV -HIV viral load not detected 12/2020 with stable CD4 count -Hold medications for now  Schizophrenia -Plan to hold Haldol and Depakote  Hypertension-stable  Type 2 diabetes-controlled -Noted to have some mild  hypoglycemia requiring D50 -SSI -Holding home Levemir and A1c pending  DVT prophylaxis:SCDs Code Status: Full Family Communication: None at bedside, called sister 6/17 Disposition Plan:  Status is: Inpatient  Remains inpatient appropriate because:Altered mental status, IV treatments appropriate due to intensity of illness or inability to take PO, and Inpatient level of care appropriate due to severity of illness  Dispo: The patient is from: Group home              Anticipated d/c is to: Group home              Patient currently is not medically stable to d/c.   Difficult to place patient No  Consultants:  GI  Procedures:  See below  Antimicrobials:  Anti-infectives (From admission, onward)    Start     Dose/Rate Route Frequency Ordered Stop   05/11/21 1200  Ampicillin-Sulbactam (UNASYN) 3 g in sodium chloride 0.9 % 100 mL IVPB        3 g 200 mL/hr over 30 Minutes Intravenous Every 12 hours 05/11/21 1159     05/10/21 1915  Ampicillin-Sulbactam (UNASYN) 3 g in sodium chloride 0.9 % 100 mL IVPB  Status:  Discontinued        3 g 200 mL/hr over 30 Minutes Intravenous Every 24 hours 05/10/21 1900 05/11/21 1159   05/10/21 1430  cefTRIAXone (ROCEPHIN) 2 g in sodium chloride 0.9 % 100 mL IVPB  Status:  Discontinued        2 g 200 mL/hr over 30 Minutes Intravenous Every 24 hours 05/10/21 1417 05/10/21 1849   05/10/21 1430  azithromycin (ZITHROMAX) 500 mg in sodium chloride 0.9 % 250 mL IVPB        500 mg 250 mL/hr over 60 Minutes Intravenous Every 24 hours 05/10/21  1417         Subjective: Patient seen and evaluated today and is currently quite somnolent and poorly responsive.  No acute events noted since admission.  Objective: Vitals:   05/11/21 1030 05/11/21 1100 05/11/21 1130 05/11/21 1237  BP: 113/71 114/65 110/69   Pulse: (!) 101 (!) 101    Resp: (!) 21 (!) 24 (!) 22   Temp:    99.9 F (37.7 C)  TempSrc:    Oral  SpO2: 92% 95%    Weight:    93.9 kg  Height:    5'  8" (1.727 m)    Intake/Output Summary (Last 24 hours) at 05/11/2021 1359 Last data filed at 05/11/2021 1314 Gross per 24 hour  Intake 1517.7 ml  Output 1250 ml  Net 267.7 ml   Filed Weights   05/10/21 1230 05/11/21 1237  Weight: 99.8 kg 93.9 kg    Examination:  General exam: Appears somnolent and mostly unresponsive Respiratory system: Clear to auscultation. Respiratory effort normal.  Currently on 3 L nasal cannula oxygen Cardiovascular system: S1 & S2 heard, RRR.  Gastrointestinal system: Abdomen is soft Central nervous system: Somnolent Extremities: No edema Skin: No significant lesions noted Psychiatry: Flat affect.    Data Reviewed: I have personally reviewed following labs and imaging studies  CBC: Recent Labs  Lab 05/10/21 1231 05/11/21 0418  WBC 5.4 6.7  NEUTROABS 3.0  --   HGB 14.9 12.3  HCT 45.8 38.5  MCV 97.9 100.5*  PLT 90* 80*   Basic Metabolic Panel: Recent Labs  Lab 05/10/21 1424 05/10/21 2254 05/11/21 0418  NA 138 139 141  K 4.7 4.7 4.8  CL 102 108 111  CO2 22 23 20*  GLUCOSE 97 99 84  BUN 83* 74* 70*  CREATININE 5.15* 4.01* 3.36*  CALCIUM 7.8* 7.6* 7.7*   GFR: Estimated Creatinine Clearance: 22.1 mL/min (A) (by C-G formula based on SCr of 3.36 mg/dL (H)). Liver Function Tests: Recent Labs  Lab 05/10/21 1424 05/10/21 2254 05/11/21 0418  AST 913* 776* 729*  ALT 272* 242* 224*  ALKPHOS 82 75 67  BILITOT 0.3 0.1* 0.3  PROT 7.2 6.5 6.1*  ALBUMIN 3.3* 3.0* 2.8*   No results for input(s): LIPASE, AMYLASE in the last 168 hours. Recent Labs  Lab 05/10/21 1424 05/11/21 1154  AMMONIA 20 30   Coagulation Profile: Recent Labs  Lab 05/10/21 1424 05/10/21 2254 05/11/21 1154  INR 1.0 1.0 1.1   Cardiac Enzymes: No results for input(s): CKTOTAL, CKMB, CKMBINDEX, TROPONINI in the last 168 hours. BNP (last 3 results) No results for input(s): PROBNP in the last 8760 hours. HbA1C: No results for input(s): HGBA1C in the last 72  hours. CBG: Recent Labs  Lab 05/10/21 1856 05/11/21 0025 05/11/21 1316 05/11/21 1349  GLUCAP 80 93 51* 105*   Lipid Profile: No results for input(s): CHOL, HDL, LDLCALC, TRIG, CHOLHDL, LDLDIRECT in the last 72 hours. Thyroid Function Tests: No results for input(s): TSH, T4TOTAL, FREET4, T3FREE, THYROIDAB in the last 72 hours. Anemia Panel: No results for input(s): VITAMINB12, FOLATE, FERRITIN, TIBC, IRON, RETICCTPCT in the last 72 hours. Sepsis Labs: Recent Labs  Lab 05/10/21 1231 05/10/21 1424  LATICACIDVEN 2.0* 1.7    Recent Results (from the past 240 hour(s))  Blood culture (routine single)     Status: None (Preliminary result)   Collection Time: 05/10/21 12:31 PM   Specimen: BLOOD  Result Value Ref Range Status   Specimen Description BLOOD LEFT ANTECUBITAL  Final  Special Requests   Final    BOTTLES DRAWN AEROBIC AND ANAEROBIC Blood Culture adequate volume   Culture   Final    NO GROWTH < 24 HOURS Performed at [facility], [address], [place], [place] [address]    Report Status PENDING  Incomplete  Resp Panel by RT-PCR (Flu A&B, Covid) Nasopharyngeal Swab     Status: None   Collection Time: 05/10/21  2:58 PM   Specimen: Nasopharyngeal Swab; Nasopharyngeal(NP) swabs in vial transport medium  Result Value Ref Range Status   SARS Coronavirus 2 by RT PCR NEGATIVE NEGATIVE Final    Comment: (NOTE) SARS-CoV-2 target nucleic acids are NOT DETECTED.  The SARS-CoV-2 RNA is generally detectable in upper respiratory specimens during the acute phase of infection. The lowest concentration of SARS-CoV-2 viral copies this assay can detect is 138 copies/mL. A negative result does not preclude SARS-Cov-2 infection and should not be used as the sole basis for treatment or other patient management decisions. A negative result may occur with  improper specimen collection/handling, submission of specimen other than nasopharyngeal swab, presence of viral mutation(s) within  the areas targeted by this assay, and inadequate number of viral copies(<138 copies/mL). A negative result must be combined with clinical observations, patient history, and epidemiological information. The expected result is Negative.  Fact Sheet for Patients:  EntrepreneurPulse.com.au  Fact Sheet for Healthcare Providers:  IncredibleEmployment.be  This test is no t yet approved or cleared by the [place] FDA and  has been authorized for detection and/or diagnosis of SARS-CoV-2 by FDA under an Emergency Use Authorization (EUA). This EUA will remain  in effect (meaning this test can be used) for the duration of the COVID-19 declaration under Section 564(b)(1) of the Act, 21 U.S.C.section 360bbb-3(b)(1), unless the authorization is terminated  or revoked sooner.       Influenza A by PCR NEGATIVE NEGATIVE Final   Influenza B by PCR NEGATIVE NEGATIVE Final    Comment: (NOTE) The Xpert Xpress SARS-CoV-2/FLU/RSV plus assay is intended as an aid in the diagnosis of influenza from Nasopharyngeal swab specimens and should not be used as a sole basis for treatment. Nasal washings and aspirates are unacceptable for Xpert Xpress SARS-CoV-2/FLU/RSV testing.  Fact Sheet for Patients: EntrepreneurPulse.com.au  Fact Sheet for Healthcare Providers: IncredibleEmployment.be  This test is not yet approved or cleared by the [place] FDA and has been authorized for detection and/or diagnosis of SARS-CoV-2 by FDA under an Emergency Use Authorization (EUA). This EUA will remain in effect (meaning this test can be used) for the duration of the COVID-19 declaration under Section 564(b)(1) of the Act, 21 U.S.C. section 360bbb-3(b)(1), unless the authorization is terminated or revoked.  Performed at [facility], [address], [place], [place] [address]          Radiology Studies: CT ABDOMEN PELVIS WO  CONTRAST  Result Date: 05/10/2021 CLINICAL DATA:  Lethargy. Abdominal pain. Elevated liver enzymes. Acute kidney injury. Altered mental status. History of umbilical hernia. EXAM: CT ABDOMEN AND PELVIS WITHOUT CONTRAST TECHNIQUE: Multidetector CT imaging of the abdomen and pelvis was performed following the standard protocol without IV contrast. COMPARISON:  None. FINDINGS: Lower chest: Hypoventilatory changes at the dependent lung bases. Coronary atherosclerosis. Hepatobiliary: Normal liver size. No liver mass. Normal gallbladder with no radiopaque cholelithiasis. No biliary ductal dilatation. Pancreas: Normal, with no mass or duct dilation. Spleen: Normal size. No mass. Adrenals/Urinary Tract: Normal adrenals. No renal stones. No hydronephrosis. No contour deforming renal masses. Bladder collapsed by  indwelling Foley catheter and grossly normal. Stomach/Bowel: Normal non-distended stomach. Normal caliber small bowel with no small bowel wall thickening. Normal appendix. Normal large bowel with no diverticulosis, large bowel wall thickening or pericolonic fat stranding. Vascular/Lymphatic: Atherosclerotic nonaneurysmal abdominal aorta. No pathologically enlarged lymph nodes in the abdomen or pelvis. Reproductive: Grossly normal uterus.  No adnexal mass. Other: No pneumoperitoneum. Trace free fluid in the pelvic cul-de-sac. No focal fluid collections. No evidence of a ventral abdominal hernia. Musculoskeletal: No aggressive appearing focal osseous lesions. IMPRESSION: 1. No acute abnormality. No hydronephrosis. No evidence of bowel obstruction or acute bowel inflammation. Normal appendix. 2. Trace free fluid in the pelvic cul-de-sac, nonspecific. No adnexal masses. 3. Coronary atherosclerosis. 4. Aortic Atherosclerosis (ICD10-I70.0). Electronically Signed   By: Ilona Sorrel M.D.   On: 05/10/2021 18:39   CT Head Wo Contrast  Result Date: 05/10/2021 CLINICAL DATA:  Unresponsive prior to arrival. Question  intracranial hemorrhage. EXAM: CT HEAD WITHOUT CONTRAST TECHNIQUE: Contiguous axial images were obtained from the base of the skull through the vertex without intravenous contrast. COMPARISON:  None. FINDINGS: Brain: Brainstem appears normal. No focal cerebellar finding. Cerebral hemispheres show moderate chronic small-vessel ischemic changes of the white matter. No sign of acute infarction, mass lesion, hemorrhage, hydrocephalus or extra-axial collection. Vascular: There is atherosclerotic calcification of the major vessels at the base of the brain. Skull: Negative Sinuses/Orbits: Clear/normal Other: None IMPRESSION: No acute finding by CT. Moderate chronic small-vessel ischemic changes of the cerebral hemispheric white matter. Electronically Signed   By: Nelson Chimes M.D.   On: 05/10/2021 13:56   DG Chest Port 1 View  Result Date: 05/10/2021 CLINICAL DATA:  Unresponsive with concern for drug overdose EXAM: PORTABLE CHEST 1 VIEW COMPARISON:  January 27, 2018 chest radiograph. Chest CT October 17, 2017 FINDINGS: There are areas of ill-defined airspace opacity in right mid and lower lung regions. Lungs elsewhere are clear. Heart is upper normal in size, stable, with prominence of central pulmonary arteries, consistent with pulmonary arterial hypertension. No adenopathy appreciable. No bone lesions IMPRESSION: Areas of ill-defined opacity right mid and lower lung regions. Suspect early pneumonia or possible foci of aspiration. Lungs elsewhere clear. Heart upper normal in size with evidence of pulmonary arterial hypertension. Note that pulmonary arterial hypertension was present on prior CT examination. Electronically Signed   By: Lowella Grip III M.D.   On: 05/10/2021 13:23        Scheduled Meds:  Chlorhexidine Gluconate Cloth  6 each Topical Daily   insulin aspart  0-9 Units Subcutaneous Q6H   Continuous Infusions:  sodium chloride 100 mL/hr at 05/11/21 0931   ampicillin-sulbactam (UNASYN) IV 200  mL/hr at 05/11/21 1314   azithromycin 500 mg (05/11/21 1331)     LOS: 1 day    Time spent: 35 minutes    Treysean Petruzzi Darleen Crocker, DO [facility]  If 7PM-7AM, please contact night-coverage [email or web address] 05/11/2021, 1:59 PM

## 2021-05-11 NOTE — Progress Notes (Signed)
Patient up to ICU bed 1 from ED for Altered Mental Status.   Blood sugar checked, 51.  Patient NPO, D50 given IV per protocol. Blood sugar recheck after 15 minutes, read 105

## 2021-05-11 NOTE — ED Notes (Signed)
ED TO INPATIENT HANDOFF REPORT  ED Nurse Name and Phone #:  [phone]  S Name/Age/Gender Alvester Morin 60 y.o. female Room/Bed: APA18/APA18  Code Status   Code Status: Full Code  Home/SNF/Other Nursing Home  Is this baseline? No   Triage Complete: Triage complete  Chief Complaint AMS (altered mental status) [R41.82]  Triage Note Pt presents to ED via [facility] for possible OD. Pt was unresponsive, snoring respirations upon EMS arrival, pin point pupils. Narcan 2 mg given, pt started to come around and respond, started to have garbled speech. Pt from [facility].     Allergies Allergies  Allergen Reactions  . Asa [Aspirin] Nausea And Vomiting    Level of Care/Admitting Diagnosis ED Disposition    ED Disposition  Admit   Condition  --   [place]: [facility] [[id number]]  Level of Care: Stepdown [14]  Covid Evaluation: Asymptomatic Screening Protocol (No Symptoms)  Diagnosis: AMS (altered mental status) [[id number]]  Admitting Physician: Bethena Roys [[id number]]  Attending Physician: Bethena Roys [phone]  Estimated length of stay: past midnight tomorrow  Certification:: I certify this patient will need inpatient services for at least 2 midnights         B Medical/Surgery History Past Medical History:  Diagnosis Date  . 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 Bloating    per sister  . Chronic right hip pain    sister states circulation problem in hip  . Family history of adverse reaction to anesthesia    sister has hx. of post-op N/V  . High cholesterol   . HIV (human immunodeficiency virus infection) ([place])   . Hypertension   . Limp   . Mild intellectual disability   . No natural teeth    does not wear her dentures  . Paranoid schizophrenia ([place])   . Personality disorder ([place])   . Schizophrenia [facility])    Past Surgical History:  Procedure Laterality Date  . BREAST LUMPECTOMY WITH NEEDLE LOCALIZATION  Left 07/28/2015   Procedure: BREAST LUMPECTOMY WITH NEEDLE LOCALIZATION;  Surgeon: Autumn Messing III, MD;  Location: [place];  Service: General;  Laterality: Left;  . COLONOSCOPY WITH PROPOFOL N/A 06/13/2015   SLF: 1. the left colon is redundant 2. the examination was otherwise normal 3. small internal hemorrhoids 4. moderate sized external hemorrhoids  . HEMORRHOID SURGERY    . UMBILICAL HERNIA REPAIR       A IV Location/Drains/Wounds Patient Lines/Drains/Airways Status    Active Line/Drains/Airways    Name Placement date Placement time Site Days   Peripheral IV 05/10/21 20 G Right Antecubital 05/10/21  1245  Antecubital  1   Urethral Catheter Double-lumen 14 Fr. 05/10/21  1458  Double-lumen  1   Incision (Closed) 07/28/15 Breast Left 07/28/15  1433  -- 2114          Intake/Output Last 24 hours  Intake/Output Summary (Last 24 hours) at 05/11/2021 1111 Last data filed at 05/11/2021 0932 Gross per 24 hour  Intake 1350 ml  Output 1250 ml  Net 100 ml    Labs/Imaging Results for orders placed or performed during the hospital encounter of 05/10/21 (from the past 48 hour(s))  Lactic acid, plasma     Status: Abnormal   Collection Time: 05/10/21 12:31 PM  Result Value Ref Range   Lactic Acid, Venous 2.0 (HH) 0.5 - 1.9 mmol/L    Comment: CRITICAL RESULT CALLED TO, READ BACK BY AND VERIFIED WITH: J.  LONG 05/10/21 @ 1345 BY S BEARD Performed at [facility], [address] [place], [place] [address]   CBC WITH DIFFERENTIAL     Status: Abnormal   Collection Time: 05/10/21 12:31 PM  Result Value Ref Range   WBC 5.4 4.0 - 10.5 K/uL   RBC 4.68 3.87 - 5.11 MIL/uL   Hemoglobin 14.9 12.0 - 15.0 g/dL   HCT 45.8 36.0 - 46.0 %   MCV 97.9 80.0 - 100.0 fL   MCH 31.8 26.0 - 34.0 pg   MCHC 32.5 30.0 - 36.0 g/dL   RDW 15.7 (H) 11.5 - 15.5 %   Platelets 90 (L) 150 - 400 K/uL    Comment: SPECIMEN CHECKED FOR CLOTS Immature Platelet Fraction may be clinically indicated,  consider ordering this additional test [id number] PLATELET COUNT CONFIRMED BY SMEAR    nRBC 0.4 (H) 0.0 - 0.2 %   Neutrophils Relative % 56 %   Neutro Abs 3.0 1.7 - 7.7 K/uL   Lymphocytes Relative 31 %   Lymphs Abs 1.7 0.7 - 4.0 K/uL   Monocytes Relative 12 %   Monocytes Absolute 0.7 0.1 - 1.0 K/uL   Eosinophils Relative 0 %   Eosinophils Absolute 0.0 0.0 - 0.5 K/uL   Basophils Relative 1 %   Basophils Absolute 0.0 0.0 - 0.1 K/uL   Immature Granulocytes 0 %   Abs Immature Granulocytes 0.02 0.00 - 0.07 K/uL    Comment: Performed at [facility], [address] [place], [place] [address]  Blood culture (routine single)     Status: None (Preliminary result)   Collection Time: 05/10/21 12:31 PM   Specimen: BLOOD  Result Value Ref Range   Specimen Description BLOOD LEFT ANTECUBITAL    Special Requests      BOTTLES DRAWN AEROBIC AND ANAEROBIC Blood Culture adequate volume   Culture      NO GROWTH < 24 HOURS Performed at [facility], [address], [place], [place] [address]    Report Status PENDING   Urinalysis, Routine w reflex microscopic Urine, Catheterized     Status: Abnormal   Collection Time: 05/10/21 12:31 PM  Result Value Ref Range   Color, Urine YELLOW YELLOW   APPearance CLEAR CLEAR   Specific Gravity, Urine >1.030 (H) 1.005 - 1.030   pH 5.5 5.0 - 8.0   Glucose, UA NEGATIVE NEGATIVE mg/dL   Hgb urine dipstick LARGE (A) NEGATIVE   Bilirubin Urine SMALL (A) NEGATIVE   Ketones, ur NEGATIVE NEGATIVE mg/dL   Protein, ur 30 (A) NEGATIVE mg/dL   Nitrite NEGATIVE NEGATIVE   Leukocytes,Ua SMALL (A) NEGATIVE    Comment: Performed at [facility], [address] [place], [place] [address]  Brain natriuretic peptide     Status: None   Collection Time: 05/10/21 12:31 PM  Result Value Ref Range   B Natriuretic Peptide 21.0 0.0 - 100.0 pg/mL    Comment: Performed at [facility], [address] [place], [place] [address]  Urinalysis, Microscopic (reflex)     Status: Abnormal    Collection Time: 05/10/21 12:31 PM  Result Value Ref Range   RBC / HPF 11-20 0 - 5 RBC/hpf   WBC, UA 11-20 0 - 5 WBC/hpf   Bacteria, UA FEW (A) NONE SEEN   Squamous Epithelial / LPF 21-50 0 - 5    Comment: Performed at [facility], [address] [place], [place] [address]  Rapid urine drug screen (hospital performed)     Status: Abnormal   Collection  Time: 05/10/21 12:33 PM  Result Value Ref Range   Opiates NONE DETECTED NONE DETECTED   Cocaine NONE DETECTED NONE DETECTED   Benzodiazepines POSITIVE (A) NONE DETECTED   Amphetamines NONE DETECTED NONE DETECTED   Tetrahydrocannabinol NONE DETECTED NONE DETECTED   Barbiturates NONE DETECTED NONE DETECTED    Comment: (NOTE) DRUG SCREEN FOR MEDICAL PURPOSES ONLY.  IF CONFIRMATION IS NEEDED FOR ANY PURPOSE, NOTIFY LAB WITHIN 5 DAYS.  LOWEST DETECTABLE LIMITS FOR URINE DRUG SCREEN Drug Class                     Cutoff (ng/mL) Amphetamine and metabolites    1000 Barbiturate and metabolites    200 Benzodiazepine                 892 Tricyclics and metabolites     300 Opiates and metabolites        300 Cocaine and metabolites        300 THC                            50 Performed at [facility], [address] [place], [place] [address]   Lactic acid, plasma     Status: None   Collection Time: 05/10/21  2:24 PM  Result Value Ref Range   Lactic Acid, Venous 1.7 0.5 - 1.9 mmol/L    Comment: Performed at [facility], [address], [place], [place] [address]  Ammonia     Status: None   Collection Time: 05/10/21  2:24 PM  Result Value Ref Range   Ammonia 20 9 - 35 umol/L    Comment: Performed at [facility], [address] [place], [place] [address]  Protime-INR     Status: None   Collection Time: 05/10/21  2:24 PM  Result Value Ref Range   Prothrombin Time 13.6 11.4 - 15.2 seconds   INR 1.0 0.8 - 1.2    Comment: (NOTE) INR goal varies based on device and disease states. Performed at [facility], [address]  [place], [place] [address]   APTT     Status: None   Collection Time: 05/10/21  2:24 PM  Result Value Ref Range   aPTT 25 24 - 36 seconds    Comment: Performed at [facility], [address] [place], [place] [address]  Comprehensive metabolic panel     Status: Abnormal   Collection Time: 05/10/21  2:24 PM  Result Value Ref Range   Sodium 138 135 - 145 mmol/L   Potassium 4.7 3.5 - 5.1 mmol/L   Chloride 102 98 - 111 mmol/L   CO2 22 22 - 32 mmol/L   Glucose, Bld 97 70 - 99 mg/dL    Comment: Glucose reference range applies only to samples taken after fasting for at least 8 hours.   BUN 83 (H) 6 - 20 mg/dL   Creatinine, Ser 5.15 (H) 0.44 - 1.00 mg/dL   Calcium 7.8 (L) 8.9 - 10.3 mg/dL   Total Protein 7.2 6.5 - 8.1 g/dL   Albumin 3.3 (L) 3.5 - 5.0 g/dL   AST 913 (H) 15 - 41 U/L   ALT 272 (H) 0 - 44 U/L   Alkaline Phosphatase 82 38 - 126 U/L   Total Bilirubin 0.3 0.3 - 1.2 mg/dL   GFR, Estimated 9 (L) >60 mL/min    Comment: (NOTE) Calculated using the CKD-EPI Creatinine Equation (2021)    Anion gap  14 5 - 15    Comment: Performed at [facility], [address] [place], [place] [address]  Troponin I (High Sensitivity)     Status: Abnormal   Collection Time: 05/10/21  2:24 PM  Result Value Ref Range   Troponin I (High Sensitivity) 331 (HH) <18 ng/L    Comment: CRITICAL RESULT CALLED TO, READ BACK BY AND VERIFIED WITH: LONG,J ON 05/10/21 AT 1520 BY LOY,C (NOTE) Elevated high sensitivity troponin I (hsTnI) values and significant  changes across serial measurements may suggest ACS but many other  chronic and acute conditions are known to elevate hsTnI results.  Refer to the Links section for chest pain algorithms and additional  guidance. Performed at [facility], [address], [place], [place] [address]   Blood gas, venous     Status: Abnormal   Collection Time: 05/10/21  2:30 PM  Result Value Ref Range   FIO2 28.00    pH, Ven 7.204 (L) 7.250 - 7.430   pCO2, Ven 56.1 44.0 - 60.0  mmHg   pO2, Ven 40.5 32.0 - 45.0 mmHg   Bicarbonate 18.0 (L) 20.0 - 28.0 mmol/L   Acid-base deficit 5.5 (H) 0.0 - 2.0 mmol/L   O2 Saturation 56.5 %   Patient temperature 37.6     Comment: Performed at [facility], [address] [place], [place] [address]  Resp Panel by RT-PCR (Flu A&B, Covid) Nasopharyngeal Swab     Status: None   Collection Time: 05/10/21  2:58 PM   Specimen: Nasopharyngeal Swab; Nasopharyngeal(NP) swabs in vial transport medium  Result Value Ref Range   SARS Coronavirus 2 by RT PCR NEGATIVE NEGATIVE    Comment: (NOTE) SARS-CoV-2 target nucleic acids are NOT DETECTED.  The SARS-CoV-2 RNA is generally detectable in upper respiratory specimens during the acute phase of infection. The lowest concentration of SARS-CoV-2 viral copies this assay can detect is 138 copies/mL. A negative result does not preclude SARS-Cov-2 infection and should not be used as the sole basis for treatment or other patient management decisions. A negative result may occur with  improper specimen collection/handling, submission of specimen other than nasopharyngeal swab, presence of viral mutation(s) within the areas targeted by this assay, and inadequate number of viral copies(<138 copies/mL). A negative result must be combined with clinical observations, patient history, and epidemiological information. The expected result is Negative.  Fact Sheet for Patients:  EntrepreneurPulse.com.au  Fact Sheet for Healthcare Providers:  IncredibleEmployment.be  This test is no t yet approved or cleared by the [place] FDA and  has been authorized for detection and/or diagnosis of SARS-CoV-2 by FDA under an Emergency Use Authorization (EUA). This EUA will remain  in effect (meaning this test can be used) for the duration of the COVID-19 declaration under Section 564(b)(1) of the Act, 21 U.S.C.section 360bbb-3(b)(1), unless the authorization is terminated  or  revoked sooner.       Influenza A by PCR NEGATIVE NEGATIVE   Influenza B by PCR NEGATIVE NEGATIVE    Comment: (NOTE) The Xpert Xpress SARS-CoV-2/FLU/RSV plus assay is intended as an aid in the diagnosis of influenza from Nasopharyngeal swab specimens and should not be used as a sole basis for treatment. Nasal washings and aspirates are unacceptable for Xpert Xpress SARS-CoV-2/FLU/RSV testing.  Fact Sheet for Patients: EntrepreneurPulse.com.au  Fact Sheet for Healthcare Providers: IncredibleEmployment.be  This test is not yet approved or cleared by the [place] FDA and has been authorized for detection and/or diagnosis of SARS-CoV-2 by FDA under  an Emergency Use Authorization (EUA). This EUA will remain in effect (meaning this test can be used) for the duration of the COVID-19 declaration under Section 564(b)(1) of the Act, 21 U.S.C. section 360bbb-3(b)(1), unless the authorization is terminated or revoked.  Performed at [facility], [address] [place], [place] [address]   Acetaminophen level     Status: Abnormal   Collection Time: 05/10/21  6:52 PM  Result Value Ref Range   Acetaminophen (Tylenol), Serum <10 (L) 10 - 30 ug/mL    Comment: (NOTE) Therapeutic concentrations vary significantly. A range of 10-30 ug/mL  may be an effective concentration for many patients. However, some  are best treated at concentrations outside of this range. Acetaminophen concentrations >150 ug/mL at 4 hours after ingestion  and >50 ug/mL at 12 hours after ingestion are often associated with  toxic reactions.  Performed at [facility], [address], [place], [place] [address]   Valproic acid level     Status: Abnormal   Collection Time: 05/10/21  6:52 PM  Result Value Ref Range   Valproic Acid Lvl 33 (L) 50.0 - 100.0 ug/mL    Comment: Performed at [facility], [address], [place], [place] [address]  Troponin I (High Sensitivity)      Status: Abnormal   Collection Time: 05/10/21  6:52 PM  Result Value Ref Range   Troponin I (High Sensitivity) 396 (HH) <18 ng/L    Comment: DELTA CHECK NOTED CRITICAL RESULT CALLED TO, READ BACK BY AND VERIFIED WITH: NICHOLS,K ON 05/10/21 AT 2010 BY LOY,C (NOTE) Elevated high sensitivity troponin I (hsTnI) values and significant  changes across serial measurements may suggest ACS but many other  chronic and acute conditions are known to elevate hsTnI results.  Refer to the Links section for chest pain algorithms and additional  guidance. Performed at [facility], [address] [place], [place] [address]   CBG monitoring, ED     Status: None   Collection Time: 05/10/21  6:56 PM  Result Value Ref Range   Glucose-Capillary 80 70 - 99 mg/dL    Comment: Glucose reference range applies only to samples taken after fasting for at least 8 hours.  Troponin I (High Sensitivity)     Status: Abnormal   Collection Time: 05/10/21  8:55 PM  Result Value Ref Range   Troponin I (High Sensitivity) 360 (HH) <18 ng/L    Comment: CRITICAL RESULT CALLED TO, READ BACK BY AND VERIFIED WITH: NICHOLS,K AT 2204 ON 6.16.22 BY ISLEY,B DELTA CHECK NOTED (NOTE) Elevated high sensitivity troponin I (hsTnI) values and significant  changes across serial measurements may suggest ACS but many other  chronic and acute conditions are known to elevate hsTnI results.  Refer to the Links section for chest pain algorithms and additional  guidance. Performed at [facility], [address] [place], [place] [address]   Acetaminophen level     Status: Abnormal   Collection Time: 05/10/21 10:54 PM  Result Value Ref Range   Acetaminophen (Tylenol), Serum <10 (L) 10 - 30 ug/mL    Comment: (NOTE) Therapeutic concentrations vary significantly. A range of 10-30 ug/mL  may be an effective concentration for many patients. However, some  are best treated at concentrations outside of this range. Acetaminophen concentrations  >150 ug/mL at 4 hours after ingestion  and >50 ug/mL at 12 hours after ingestion are often associated with  toxic reactions.  Performed at [facility], [address] [place], [place] [address]   [place]  Status: None   Collection Time: 05/10/21 10:54 PM  Result Value Ref Range   Prothrombin Time 13.2 11.4 - 15.2 seconds   INR 1.0 0.8 - 1.2    Comment: (NOTE) INR goal varies based on device and disease states. Performed at [facility], [address], [place], [place] [address]   Comprehensive metabolic panel     Status: Abnormal   Collection Time: 05/10/21 10:54 PM  Result Value Ref Range   Sodium 139 135 - 145 mmol/L   Potassium 4.7 3.5 - 5.1 mmol/L   Chloride 108 98 - 111 mmol/L   CO2 23 22 - 32 mmol/L   Glucose, Bld 99 70 - 99 mg/dL    Comment: Glucose reference range applies only to samples taken after fasting for at least 8 hours.   BUN 74 (H) 6 - 20 mg/dL   Creatinine, Ser 4.01 (H) 0.44 - 1.00 mg/dL   Calcium 7.6 (L) 8.9 - 10.3 mg/dL   Total Protein 6.5 6.5 - 8.1 g/dL   Albumin 3.0 (L) 3.5 - 5.0 g/dL   AST 776 (H) 15 - 41 U/L   ALT 242 (H) 0 - 44 U/L   Alkaline Phosphatase 75 38 - 126 U/L   Total Bilirubin 0.1 (L) 0.3 - 1.2 mg/dL   GFR, Estimated 12 (L) >60 mL/min    Comment: (NOTE) Calculated using the CKD-EPI Creatinine Equation (2021)    Anion gap 8 5 - 15    Comment: Performed at [facility], [address], [place], [place] [address]  CBG monitoring, ED     Status: None   Collection Time: 05/11/21 12:25 AM  Result Value Ref Range   Glucose-Capillary 93 70 - 99 mg/dL    Comment: Glucose reference range applies only to samples taken after fasting for at least 8 hours.  CBC     Status: Abnormal   Collection Time: 05/11/21  4:18 AM  Result Value Ref Range   WBC 6.7 4.0 - 10.5 K/uL   RBC 3.83 (L) 3.87 - 5.11 MIL/uL   Hemoglobin 12.3 12.0 - 15.0 g/dL   HCT 38.5 36.0 - 46.0 %   MCV 100.5 (H) 80.0 - 100.0 fL   MCH 32.1 26.0 - 34.0 pg   MCHC 31.9 30.0 -  36.0 g/dL   RDW 15.9 (H) 11.5 - 15.5 %   Platelets 80 (L) 150 - 400 K/uL    Comment: SPECIMEN CHECKED FOR CLOTS Immature Platelet Fraction may be clinically indicated, consider ordering this additional test [id number] CONSISTENT WITH PREVIOUS RESULT    nRBC 0.0 0.0 - 0.2 %    Comment: Performed at [facility], [address], [place], [place] [address]  Comprehensive metabolic panel     Status: Abnormal   Collection Time: 05/11/21  4:18 AM  Result Value Ref Range   Sodium 141 135 - 145 mmol/L   Potassium 4.8 3.5 - 5.1 mmol/L   Chloride 111 98 - 111 mmol/L   CO2 20 (L) 22 - 32 mmol/L   Glucose, Bld 84 70 - 99 mg/dL    Comment: Glucose reference range applies only to samples taken after fasting for at least 8 hours.   BUN 70 (H) 6 - 20 mg/dL   Creatinine, Ser 3.36 (H) 0.44 - 1.00 mg/dL   Calcium 7.7 (L) 8.9 - 10.3 mg/dL   Total Protein 6.1 (L) 6.5 - 8.1 g/dL   Albumin 2.8 (L) 3.5 - 5.0 g/dL   AST 729 (H) 15 - 41  U/L   ALT 224 (H) 0 - 44 U/L   Alkaline Phosphatase 67 38 - 126 U/L   Total Bilirubin 0.3 0.3 - 1.2 mg/dL   GFR, Estimated 15 (L) >60 mL/min    Comment: (NOTE) Calculated using the CKD-EPI Creatinine Equation (2021)    Anion gap 10 5 - 15    Comment: Performed at [facility], [address] [place], [place] [address]   CT ABDOMEN PELVIS WO CONTRAST  Result Date: 05/10/2021 CLINICAL DATA:  Lethargy. Abdominal pain. Elevated liver enzymes. Acute kidney injury. Altered mental status. History of umbilical hernia. EXAM: CT ABDOMEN AND PELVIS WITHOUT CONTRAST TECHNIQUE: Multidetector CT imaging of the abdomen and pelvis was performed following the standard protocol without IV contrast. COMPARISON:  None. FINDINGS: Lower chest: Hypoventilatory changes at the dependent lung bases. Coronary atherosclerosis. Hepatobiliary: Normal liver size. No liver mass. Normal gallbladder with no radiopaque cholelithiasis. No biliary ductal dilatation. Pancreas: Normal, with no mass or duct  dilation. Spleen: Normal size. No mass. Adrenals/Urinary Tract: Normal adrenals. No renal stones. No hydronephrosis. No contour deforming renal masses. Bladder collapsed by indwelling Foley catheter and grossly normal. Stomach/Bowel: Normal non-distended stomach. Normal caliber small bowel with no small bowel wall thickening. Normal appendix. Normal large bowel with no diverticulosis, large bowel wall thickening or pericolonic fat stranding. Vascular/Lymphatic: Atherosclerotic nonaneurysmal abdominal aorta. No pathologically enlarged lymph nodes in the abdomen or pelvis. Reproductive: Grossly normal uterus.  No adnexal mass. Other: No pneumoperitoneum. Trace free fluid in the pelvic cul-de-sac. No focal fluid collections. No evidence of a ventral abdominal hernia. Musculoskeletal: No aggressive appearing focal osseous lesions. IMPRESSION: 1. No acute abnormality. No hydronephrosis. No evidence of bowel obstruction or acute bowel inflammation. Normal appendix. 2. Trace free fluid in the pelvic cul-de-sac, nonspecific. No adnexal masses. 3. Coronary atherosclerosis. 4. Aortic Atherosclerosis (ICD10-I70.0). Electronically Signed   By: Ilona Sorrel M.D.   On: 05/10/2021 18:39   CT Head Wo Contrast  Result Date: 05/10/2021 CLINICAL DATA:  Unresponsive prior to arrival. Question intracranial hemorrhage. EXAM: CT HEAD WITHOUT CONTRAST TECHNIQUE: Contiguous axial images were obtained from the base of the skull through the vertex without intravenous contrast. COMPARISON:  None. FINDINGS: Brain: Brainstem appears normal. No focal cerebellar finding. Cerebral hemispheres show moderate chronic small-vessel ischemic changes of the white matter. No sign of acute infarction, mass lesion, hemorrhage, hydrocephalus or extra-axial collection. Vascular: There is atherosclerotic calcification of the major vessels at the base of the brain. Skull: Negative Sinuses/Orbits: Clear/normal Other: None IMPRESSION: No acute finding by CT.  Moderate chronic small-vessel ischemic changes of the cerebral hemispheric white matter. Electronically Signed   By: Nelson Chimes M.D.   On: 05/10/2021 13:56   DG Chest Port 1 View  Result Date: 05/10/2021 CLINICAL DATA:  Unresponsive with concern for drug overdose EXAM: PORTABLE CHEST 1 VIEW COMPARISON:  January 27, 2018 chest radiograph. Chest CT October 17, 2017 FINDINGS: There are areas of ill-defined airspace opacity in right mid and lower lung regions. Lungs elsewhere are clear. Heart is upper normal in size, stable, with prominence of central pulmonary arteries, consistent with pulmonary arterial hypertension. No adenopathy appreciable. No bone lesions IMPRESSION: Areas of ill-defined opacity right mid and lower lung regions. Suspect early pneumonia or possible foci of aspiration. Lungs elsewhere clear. Heart upper normal in size with evidence of pulmonary arterial hypertension. Note that pulmonary arterial hypertension was present on prior CT examination. Electronically Signed   By: Lowella Grip III M.D.   On: 05/10/2021 13:23  Pending Labs Unresulted Labs (From admission, onward)    Start     Ordered   05/11/21 1107  Ammonia  Once,   STAT        05/11/21 1106   05/11/21 0500  Hemoglobin A1c  Tomorrow morning,   R       Comments: To assess prior glycemic control    05/10/21 2011   05/10/21 1804  Hepatitis panel, acute  Once,   STAT        05/10/21 1803   05/10/21 1231  Urine culture  (Undifferentiated presentation (screening labs and basic nursing orders))  ONCE - STAT,   STAT        05/10/21 1230          Vitals/Pain Today's Vitals   05/11/21 0830 05/11/21 0900 05/11/21 0930 05/11/21 1000  BP: 111/71 114/73 109/72 115/73  Pulse: 99 99 100 100  Resp: (!) 24 (!) 24 (!) 23 (!) 21  Temp:      TempSrc:      SpO2: 94% 94% 94% 94%  Weight:      Height:        Isolation Precautions No active isolations  Medications Medications  azithromycin (ZITHROMAX) 500 mg in  sodium chloride 0.9 % 250 mL IVPB (0 mg Intravenous Stopped 05/10/21 1610)  insulin aspart (novoLOG) injection 0-9 Units (0 Units Subcutaneous Not Given 05/11/21 0612)  0.9 %  sodium chloride infusion ( Intravenous New Bag/Given 05/11/21 0931)  ondansetron (ZOFRAN) tablet 4 mg (has no administration in time range)    Or  ondansetron (ZOFRAN) injection 4 mg (has no administration in time range)  polyethylene glycol (MIRALAX / GLYCOLAX) packet 17 g (has no administration in time range)  Ampicillin-Sulbactam (UNASYN) 3 g in sodium chloride 0.9 % 100 mL IVPB (0 g Intravenous Stopped 05/11/21 0612)  naloxone (NARCAN) injection 2 mg (2 mg Intravenous Given 05/10/21 1246)  sodium chloride 0.9 % bolus 1,000 mL (0 mLs Intravenous Stopped 05/10/21 1424)  sodium chloride 0.9 % bolus 2,000 mL (0 mLs Intravenous Stopped 05/10/21 1911)    Mobility  High fall risk   Focused Assessments    R Recommendations: See Admitting Provider Note  Report given to:   Additional Notes:

## 2021-05-11 NOTE — ED Provider Notes (Signed)
Signout from Dr. Roderic Palau.  60 year old here with altered mental status.  Hospitalist is already aware of patient.  Plan is to follow-up on CMP. Physical Exam  BP 115/73   Pulse 100   Temp 99.8 F (37.7 C) (Oral)   Resp (!) 21   Ht 5\' 8"  (1.727 m)   Wt 99.8 kg   SpO2 94%   BMI 33.45 kg/m   Physical Exam  ED Course/Procedures     Procedures  MDM  Hospitalist Dr. Denton Brick is admitting the patient.       Karina Rasmussen, MD 05/11/21 [phone]

## 2021-05-11 NOTE — Progress Notes (Signed)
SLP Cancellation Note  Patient Details Name: Karina Clarke MRN: [id number] DOB: July 10, 1961   Cancelled treatment:       Reason Eval/Treat Not Completed: Fatigue/lethargy limiting ability to participate;Patient's level of consciousness. Pt is not appropriate for PO trials at this time, ST will continue efforts. Thank you,  Karina Clarke, Karina Clarke Speech Language Pathologist    Karina Clarke 05/11/2021, 2:55 PM

## 2021-05-12 DIAGNOSIS — R7401 Elevation of levels of liver transaminase levels: Secondary | ICD-10-CM

## 2021-05-12 DIAGNOSIS — R400 Somnolence: Secondary | ICD-10-CM

## 2021-05-12 LAB — HEMOGLOBIN A1C
Hgb A1c MFr Bld: 6 % — ABNORMAL HIGH (ref 4.8–5.6)
Mean Plasma Glucose: 126 mg/dL

## 2021-05-12 LAB — COMPREHENSIVE METABOLIC PANEL
ALT: 181 U/L — ABNORMAL HIGH (ref 0–44)
AST: 522 U/L — ABNORMAL HIGH (ref 15–41)
Albumin: 2.6 g/dL — ABNORMAL LOW (ref 3.5–5.0)
Alkaline Phosphatase: 60 U/L (ref 38–126)
Anion gap: 6 (ref 5–15)
BUN: 49 mg/dL — ABNORMAL HIGH (ref 6–20)
CO2: 22 mmol/L (ref 22–32)
Calcium: 8 mg/dL — ABNORMAL LOW (ref 8.9–10.3)
Chloride: 119 mmol/L — ABNORMAL HIGH (ref 98–111)
Creatinine, Ser: 1.8 mg/dL — ABNORMAL HIGH (ref 0.44–1.00)
GFR, Estimated: 32 mL/min — ABNORMAL LOW (ref >60)
Glucose, Bld: 100 mg/dL — ABNORMAL HIGH (ref 70–99)
Potassium: 5 mmol/L (ref 3.5–5.1)
Sodium: 147 mmol/L — ABNORMAL HIGH (ref 135–145)
Total Bilirubin: 0.2 mg/dL — ABNORMAL LOW (ref 0.3–1.2)
Total Protein: 5.7 g/dL — ABNORMAL LOW (ref 6.5–8.1)

## 2021-05-12 LAB — GLUCOSE, CAPILLARY
Glucose-Capillary: 101 mg/dL — ABNORMAL HIGH (ref 70–99)
Glucose-Capillary: 102 mg/dL — ABNORMAL HIGH (ref 70–99)
Glucose-Capillary: 104 mg/dL — ABNORMAL HIGH (ref 70–99)
Glucose-Capillary: 142 mg/dL — ABNORMAL HIGH (ref 70–99)

## 2021-05-12 LAB — IGG, IGA, IGM
IgA: 164 mg/dL (ref 87–352)
IgG (Immunoglobin G), Serum: 1074 mg/dL (ref 586–1602)
IgM (Immunoglobulin M), Srm: 28 mg/dL (ref 26–217)

## 2021-05-12 LAB — HSV(HERPES SIMPLEX VRS) I + II AB-IGG
HSV 1 Glycoprotein G Ab, IgG: 2.41 index — ABNORMAL HIGH (ref 0.00–0.90)
HSV 2 Glycoprotein G Ab, IgG: <0.91 index (ref 0.00–0.90)

## 2021-05-12 LAB — CBC
HCT: 40 % (ref 36.0–46.0)
Hemoglobin: 12.5 g/dL (ref 12.0–15.0)
MCH: 31.8 pg (ref 26.0–34.0)
MCHC: 31.3 g/dL (ref 30.0–36.0)
MCV: 101.8 fL — ABNORMAL HIGH (ref 80.0–100.0)
Platelets: 75 10*3/uL — ABNORMAL LOW (ref 150–400)
RBC: 3.93 MIL/uL (ref 3.87–5.11)
RDW: 16.3 % — ABNORMAL HIGH (ref 11.5–15.5)
WBC: 6.1 10*3/uL (ref 4.0–10.5)
nRBC: 0.3 % — ABNORMAL HIGH (ref 0.0–0.2)

## 2021-05-12 LAB — URINE CULTURE: Culture: NO GROWTH

## 2021-05-12 LAB — CMV IGM: CMV IgM: <30 AU/mL (ref 0.0–29.9)

## 2021-05-12 LAB — MAGNESIUM: Magnesium: 2.5 mg/dL — ABNORMAL HIGH (ref 1.7–2.4)

## 2021-05-12 LAB — MRSA NEXT GEN BY PCR, NASAL: MRSA by PCR Next Gen: NOT DETECTED

## 2021-05-12 LAB — CERULOPLASMIN: Ceruloplasmin: 34.6 mg/dL (ref 19.0–39.0)

## 2021-05-12 MED ORDER — ACETAMINOPHEN 325 MG PO TABS
650.0000 mg | ORAL_TABLET | Freq: Four times a day (QID) | ORAL | Status: DC | PRN
  Administered 2021-05-12 – 2021-05-13 (×2): 650 mg via ORAL
  Filled 2021-05-12 (×2): qty 2

## 2021-05-12 MED ORDER — DEXTROSE IN LACTATED RINGERS 5 % IV SOLN: INTRAVENOUS | Status: DC

## 2021-05-12 MED ORDER — SODIUM CHLORIDE 0.9 % IV SOLN
3.0000 g | Freq: Three times a day (TID) | INTRAVENOUS | Status: DC
  Administered 2021-05-12 – 2021-05-15 (×9): 3 g via INTRAVENOUS
  Filled 2021-05-12 (×15): qty 8

## 2021-05-12 NOTE — Plan of Care (Signed)

## 2021-05-12 NOTE — Progress Notes (Signed)
Pharmacy Antibiotic Note  Karina Clarke is a 60 y.o. female admitted on 05/10/2021 with possible aspiration pneumonia 2/2 vomiting s/p possible overdose. Pharmacy has been consulted for ampicillin/sulbactam dosing. Adjust Unasyn for improved Scr  Plan: Increase Unasyn 3gIV q8h Monitor V/S, labs and cultures  Height: 5\' 8"  (172.7 cm) Weight: 93.9 kg (207 lb 0.2 oz) IBW/kg (Calculated) : 63.9  Temp (24hrs), Avg:100.1 F (37.8 C), Min:98.3 F (36.8 C), Max:101.4 F (38.6 C)  Recent Labs  Lab 05/10/21 1231 05/10/21 1424 05/10/21 2254 05/11/21 0418 05/12/21 0352  WBC 5.4  --   --  6.7 6.1  CREATININE  --  5.15* 4.01* 3.36* 1.80*  LATICACIDVEN 2.0* 1.7  --   --   --     Normalized CrCL 74mls/min Estimated Creatinine Clearance: 41.3 mL/min (A) (by C-G formula based on SCr of 1.8 mg/dL (H)).    Allergies  Allergen Reactions   Asa [Aspirin] Nausea And Vomiting    Antimicrobials this admission: Unasyn 6/16>> azithromycin 500mg  IV q24h 6/16 >>   Dose adjustments this admission: N/a  Microbiology results: 6/16 BCx: ngtd 6/16 UCx: pending   Thank you for allowing pharmacy to be a part of this patient's care.  Isac Sarna, BS Pharm D, [place] Clinical Pharmacist Pager [phone]  05/12/2021 2:09 PM

## 2021-05-12 NOTE — Progress Notes (Signed)
PROGRESS NOTE    Erisha Paugh  JEH:[id number] DOB: Oct 27, 1961 DOA: 05/10/2021 PCP: Rosita Fire, MD   Brief Narrative:   Karina Clarke is a 60 y.o. female with medical history significant for schizophrenia, HIV, COPD.  Patient was brought from nursing home with reports of unresponsiveness, with possible overdose.  She was given some Narcan prior to arrival as well as in the ED with some improvement in responsiveness.  She has been admitted with acute metabolic encephalopathy likely multifactorial in the setting of pneumonia, azotemia/AKI, and elevated liver enzymes.  GI consulted to assist with evaluation of liver injury with labs showing signs of improvement.  Assessment & Plan:   Principal Problem:   AMS (altered mental status) Active Problems:   HIV (human immunodeficiency virus infection) ([place])   Schizophrenia ([place])   Diabetes mellitus, new onset ([place])   AKI (acute kidney injury) ([place])   PNA (pneumonia)   Transaminitis   Acute metabolic encephalopathy-multifactorial; improving -Likely related to metabolic derangements that are also showing signs of improvement -Currently on full liquid diet -Head CT without acute findings -Continue to treat AKI as well as sepsis -UDS only positive for benzodiazepines with Xanax prescription outpatient   AKI-improving -Baseline 1-1.3 -Continue on LR -Recheck a.m. labs -Monitor urine output   Severe sepsis secondary to pneumonia and also possible UTI -Check procalcitonin -Continue IV Unasyn and azithromycin due to suspicion of aspiration -Urine cultures pending   Transaminitis-improving -Possibly due to drug-induced liver injury versus ischemia -Appreciate ongoing GI evaluation -Continue to follow a.m. labs  Hypernatremia -Changed IV fluid to LR, follow in a.m.   Elevated troponin -2D echocardiogram 60-65% EF with grade 1 diastolic dysfunction   Thrombocytopenia-stable -Likely related to sepsis -Continue to monitor  trend -Avoid heparin agents   HIV -HIV viral load not detected 12/2020 with stable CD4 count -Hold medications for now   Schizophrenia -Plan to hold Haldol and Depakote   Hypertension-stable   Type 2 diabetes-with hypoglycemia -Hemoglobin A1c 6.0% -Currently on D5 IV fluid   DVT prophylaxis:SCDs Code Status: Full Family Communication: None at bedside, called sister 6/17 Disposition Plan:  Status is: Inpatient   Remains inpatient appropriate because:Altered mental status, IV treatments appropriate due to intensity of illness or inability to take PO, and Inpatient level of care appropriate due to severity of illness   Dispo: The patient is from: Group home              Anticipated d/c is to: Group home              Patient currently is not medically stable to d/c.              Difficult to place patient No   Consultants:  GI   Procedures:  See below  Antimicrobials:  Anti-infectives (From admission, onward)    Start     Dose/Rate Route Frequency Ordered Stop   05/11/21 1200  Ampicillin-Sulbactam (UNASYN) 3 g in sodium chloride 0.9 % 100 mL IVPB        3 g 200 mL/hr over 30 Minutes Intravenous Every 12 hours 05/11/21 1159     05/10/21 1915  Ampicillin-Sulbactam (UNASYN) 3 g in sodium chloride 0.9 % 100 mL IVPB  Status:  Discontinued        3 g 200 mL/hr over 30 Minutes Intravenous Every 24 hours 05/10/21 1900 05/11/21 1159   05/10/21 1430  cefTRIAXone (ROCEPHIN) 2 g in sodium chloride 0.9 % 100 mL IVPB  Status:  Discontinued  2 g 200 mL/hr over 30 Minutes Intravenous Every 24 hours 05/10/21 1417 05/10/21 1849   05/10/21 1430  azithromycin (ZITHROMAX) 500 mg in sodium chloride 0.9 % 250 mL IVPB        500 mg 250 mL/hr over 60 Minutes Intravenous Every 24 hours 05/10/21 1417         Subjective: Patient seen and evaluated today and is still somnolent/lethargic, but arousable to commands and seems to answer some questions appropriately.  Objective: Vitals:    05/12/21 1200 05/12/21 1205 05/12/21 1241 05/12/21 1300  BP: (!) 143/76   (!) 148/70  Pulse: (!) 122 (!) 124  (!) 122  Resp: (!) 21 19 (!) 26 (!) 25  Temp:  (!) 100.7 F (38.2 C)    TempSrc:  Axillary    SpO2:  (!) 84%  94%  Weight:      Height:        Intake/Output Summary (Last 24 hours) at 05/12/2021 1355 Last data filed at 05/12/2021 1042 Gross per 24 hour  Intake 3394.68 ml  Output 1300 ml  Net 2094.68 ml   Filed Weights   05/10/21 1230 05/11/21 1237  Weight: 99.8 kg 93.9 kg    Examination:  General exam: Appears lethargic, but arousable to commands Respiratory system: Clear to auscultation. Respiratory effort normal.  Currently on 2.5 L nasal cannula oxygen Cardiovascular system: S1 & S2 heard, RRR.  Gastrointestinal system: Abdomen is soft Central nervous system: Lethargic/somnolent Extremities: No edema Skin: No significant lesions noted Psychiatry: Flat affect.    Data Reviewed: I have personally reviewed following labs and imaging studies  CBC: Recent Labs  Lab 05/10/21 1231 05/11/21 0418 05/12/21 0352  WBC 5.4 6.7 6.1  NEUTROABS 3.0  --   --   HGB 14.9 12.3 12.5  HCT 45.8 38.5 40.0  MCV 97.9 100.5* 101.8*  PLT 90* 80* 75*   Basic Metabolic Panel: Recent Labs  Lab 05/10/21 1424 05/10/21 2254 05/11/21 0418 05/12/21 0352  NA 138 139 141 147*  K 4.7 4.7 4.8 5.0  CL 102 108 111 119*  CO2 22 23 20* 22  GLUCOSE 97 99 84 100*  BUN 83* 74* 70* 49*  CREATININE 5.15* 4.01* 3.36* 1.80*  CALCIUM 7.8* 7.6* 7.7* 8.0*  MG  --   --   --  2.5*   GFR: Estimated Creatinine Clearance: 41.3 mL/min (A) (by C-G formula based on SCr of 1.8 mg/dL (H)). Liver Function Tests: Recent Labs  Lab 05/10/21 1424 05/10/21 2254 05/11/21 0418 05/12/21 0352  AST 913* 776* 729* 522*  ALT 272* 242* 224* 181*  ALKPHOS 82 75 67 60  BILITOT 0.3 0.1* 0.3 0.2*  PROT 7.2 6.5 6.1* 5.7*  ALBUMIN 3.3* 3.0* 2.8* 2.6*   No results for input(s): LIPASE, AMYLASE in the last  168 hours. Recent Labs  Lab 05/10/21 1424 05/11/21 1154  AMMONIA 20 30   Coagulation Profile: Recent Labs  Lab 05/10/21 1424 05/10/21 2254 05/11/21 1154  INR 1.0 1.0 1.1   Cardiac Enzymes: No results for input(s): CKTOTAL, CKMB, CKMBINDEX, TROPONINI in the last 168 hours. BNP (last 3 results) No results for input(s): PROBNP in the last 8760 hours. HbA1C: Recent Labs    05/11/21 0418  HGBA1C 6.0*   CBG: Recent Labs  Lab 05/11/21 1948 05/11/21 2017 05/12/21 0031 05/12/21 0634 05/12/21 1214  GLUCAP 55* 116* 104* 102* 101*   Lipid Profile: No results for input(s): CHOL, HDL, LDLCALC, TRIG, CHOLHDL, LDLDIRECT in the last 72 hours. Thyroid  Function Tests: No results for input(s): TSH, T4TOTAL, FREET4, T3FREE, THYROIDAB in the last 72 hours. Anemia Panel: Recent Labs    05/11/21 1316  FERRITIN 604*  TIBC 220*  IRON 32   Sepsis Labs: Recent Labs  Lab 05/10/21 1231 05/10/21 1424 05/11/21 1151  PROCALCITON  --   --  7.79  LATICACIDVEN 2.0* 1.7  --     Recent Results (from the past 240 hour(s))  Blood culture (routine single)     Status: None (Preliminary result)   Collection Time: 05/10/21 12:31 PM   Specimen: BLOOD  Result Value Ref Range Status   Specimen Description BLOOD LEFT ANTECUBITAL  Final   Special Requests   Final    BOTTLES DRAWN AEROBIC AND ANAEROBIC Blood Culture adequate volume   Culture   Final    NO GROWTH 2 DAYS Performed at [facility], [address], [place], [place] [address]    Report Status PENDING  Incomplete  Urine culture     Status: None   Collection Time: 05/10/21 12:31 PM   Specimen: In/Out Cath Urine  Result Value Ref Range Status   Specimen Description   Final    IN/OUT CATH URINE Performed at [facility], [address], [place], [place] [address]    Special Requests   Final    NONE Performed at [facility], [address] [place], [place] [address]    Culture   Final    NO GROWTH Performed at [facility], [place] [address], [place], [place] [address]    Report Status 05/12/2021 FINAL  Final  Resp Panel by RT-PCR (Flu A&B, Covid) Nasopharyngeal Swab     Status: None   Collection Time: 05/10/21  2:58 PM   Specimen: Nasopharyngeal Swab; Nasopharyngeal(NP) swabs in vial transport medium  Result Value Ref Range Status   SARS Coronavirus 2 by RT PCR NEGATIVE NEGATIVE Final    Comment: (NOTE) SARS-CoV-2 target nucleic acids are NOT DETECTED.  The SARS-CoV-2 RNA is generally detectable in upper respiratory specimens during the acute phase of infection. The lowest concentration of SARS-CoV-2 viral copies this assay can detect is 138 copies/mL. A negative result does not preclude SARS-Cov-2 infection and should not be used as the sole basis for treatment or other patient management decisions. A negative result may occur with  improper specimen collection/handling, submission of specimen other than nasopharyngeal swab, presence of viral mutation(s) within the areas targeted by this assay, and inadequate number of viral copies(<138 copies/mL). A negative result must be combined with clinical observations, patient history, and epidemiological information. The expected result is Negative.  Fact Sheet for Patients:  EntrepreneurPulse.com.au  Fact Sheet for Healthcare Providers:  IncredibleEmployment.be  This test is no t yet approved or cleared by the [place] FDA and  has been authorized for detection and/or diagnosis of SARS-CoV-2 by FDA under an Emergency Use Authorization (EUA). This EUA will remain  in effect (meaning this test can be used) for the duration of the COVID-19 declaration under Section 564(b)(1) of the Act, 21 U.S.C.section 360bbb-3(b)(1), unless the authorization is terminated  or revoked sooner.       Influenza A by PCR NEGATIVE NEGATIVE Final   Influenza B by PCR NEGATIVE NEGATIVE Final    Comment: (NOTE) The Xpert Xpress  SARS-CoV-2/FLU/RSV plus assay is intended as an aid in the diagnosis of influenza from Nasopharyngeal swab specimens and should not be used as a sole basis for treatment. Nasal washings and aspirates are unacceptable for  Xpert Xpress SARS-CoV-2/FLU/RSV testing.  Fact Sheet for Patients: EntrepreneurPulse.com.au  Fact Sheet for Healthcare Providers: IncredibleEmployment.be  This test is not yet approved or cleared by the [place] FDA and has been authorized for detection and/or diagnosis of SARS-CoV-2 by FDA under an Emergency Use Authorization (EUA). This EUA will remain in effect (meaning this test can be used) for the duration of the COVID-19 declaration under Section 564(b)(1) of the Act, 21 U.S.C. section 360bbb-3(b)(1), unless the authorization is terminated or revoked.  Performed at [facility], [address], [place], [place] [address]   MRSA Next Gen by PCR, Nasal     Status: None   Collection Time: 05/11/21 12:45 PM   Specimen: Nasal Mucosa; Nasal Swab  Result Value Ref Range Status   MRSA by PCR Next Gen NOT DETECTED NOT DETECTED Final    Comment: (NOTE) The GeneXpert MRSA Assay (FDA approved for NASAL specimens only), is one component of a comprehensive MRSA colonization surveillance program. It is not intended to diagnose MRSA infection nor to guide or monitor treatment for MRSA infections. Test performance is not FDA approved in patients less than 58 years old. Performed at [facility], [address], [place], [place] [address]          Radiology Studies: CT ABDOMEN PELVIS WO CONTRAST  Result Date: 05/10/2021 CLINICAL DATA:  Lethargy. Abdominal pain. Elevated liver enzymes. Acute kidney injury. Altered mental status. History of umbilical hernia. EXAM: CT ABDOMEN AND PELVIS WITHOUT CONTRAST TECHNIQUE: Multidetector CT imaging of the abdomen and pelvis was performed following the standard protocol without IV contrast.  COMPARISON:  None. FINDINGS: Lower chest: Hypoventilatory changes at the dependent lung bases. Coronary atherosclerosis. Hepatobiliary: Normal liver size. No liver mass. Normal gallbladder with no radiopaque cholelithiasis. No biliary ductal dilatation. Pancreas: Normal, with no mass or duct dilation. Spleen: Normal size. No mass. Adrenals/Urinary Tract: Normal adrenals. No renal stones. No hydronephrosis. No contour deforming renal masses. Bladder collapsed by indwelling Foley catheter and grossly normal. Stomach/Bowel: Normal non-distended stomach. Normal caliber small bowel with no small bowel wall thickening. Normal appendix. Normal large bowel with no diverticulosis, large bowel wall thickening or pericolonic fat stranding. Vascular/Lymphatic: Atherosclerotic nonaneurysmal abdominal aorta. No pathologically enlarged lymph nodes in the abdomen or pelvis. Reproductive: Grossly normal uterus.  No adnexal mass. Other: No pneumoperitoneum. Trace free fluid in the pelvic cul-de-sac. No focal fluid collections. No evidence of a ventral abdominal hernia. Musculoskeletal: No aggressive appearing focal osseous lesions. IMPRESSION: 1. No acute abnormality. No hydronephrosis. No evidence of bowel obstruction or acute bowel inflammation. Normal appendix. 2. Trace free fluid in the pelvic cul-de-sac, nonspecific. No adnexal masses. 3. Coronary atherosclerosis. 4. Aortic Atherosclerosis (ICD10-I70.0). Electronically Signed   By: Ilona Sorrel M.D.   On: 05/10/2021 18:39   ECHOCARDIOGRAM COMPLETE  Result Date: 05/11/2021    ECHOCARDIOGRAM REPORT   Patient Name:   KARIANNE NOGUEIRA Date of Exam: 05/11/2021 Medical Rec #:  [id number]      Height:       68.0 in Accession #:    [id number]     Weight:       207.0 lb Date of Birth:  1961-11-24      BSA:          2.074 m Patient Age:    66 years       BP:           110/69 mmHg Patient Gender: F  HR:           100 bpm. Exam Location:  [facility] Procedure: 2D Echo, Cardiac  Doppler and Color Doppler Indications:    Elevated Troponin  History:        Patient has prior history of Echocardiogram examinations, most                 recent 10/17/2017. COPD; Signs/Symptoms:Altered Mental Status.                 Sepsis, elevated Troponin, schizophrenia, HIV, pneumonia.  Sonographer:    Dustin Flock RDCS Referring Phys: [place]  1. Left ventricular ejection fraction, by estimation, is 60 to 65%. The left ventricle has normal function. The left ventricle has no regional wall motion abnormalities. There is mild left ventricular hypertrophy. Left ventricular diastolic parameters are consistent with Grade I diastolic dysfunction (impaired relaxation).  2. Right ventricular systolic function is mildly reduced. The right ventricular size is moderately enlarged. There is normal pulmonary artery systolic pressure.  3. The mitral valve is normal in structure. No evidence of mitral valve regurgitation. No evidence of mitral stenosis.  4. The aortic valve is tricuspid. Aortic valve regurgitation is not visualized. No aortic stenosis is present.  5. The inferior vena cava is normal in size with greater than 50% respiratory variability, suggesting right atrial pressure of 3 mmHg. FINDINGS  Left Ventricle: Left ventricular ejection fraction, by estimation, is 60 to 65%. The left ventricle has normal function. The left ventricle has no regional wall motion abnormalities. The left ventricular internal cavity size was normal in size. There is  mild left ventricular hypertrophy. Left ventricular diastolic parameters are consistent with Grade I diastolic dysfunction (impaired relaxation). Normal left ventricular filling pressure. Right Ventricle: The right ventricular size is moderately enlarged. Right vetricular wall thickness was not assessed. Right ventricular systolic function is mildly reduced. There is normal pulmonary artery systolic pressure. The tricuspid regurgitant  velocity is 2.45 m/s, and with an assumed right atrial pressure of 3 mmHg, the estimated right ventricular systolic pressure is 89.3 mmHg. Left Atrium: Left atrial size was normal in size. Right Atrium: Right atrial size was normal in size. Pericardium: There is no evidence of pericardial effusion. Mitral Valve: The mitral valve is normal in structure. No evidence of mitral valve regurgitation. No evidence of mitral valve stenosis. Tricuspid Valve: The tricuspid valve is not well visualized. Tricuspid valve regurgitation is trivial. No evidence of tricuspid stenosis. Aortic Valve: The aortic valve is tricuspid. Aortic valve regurgitation is not visualized. No aortic stenosis is present. Aortic valve mean gradient measures 5.2 mmHg. Aortic valve peak gradient measures 10.0 mmHg. Aortic valve area, by VTI measures 2.35  cm. Pulmonic Valve: The pulmonic valve was not well visualized. Pulmonic valve regurgitation is not visualized. No evidence of pulmonic stenosis. Aorta: The aortic root is normal in size and structure. Pulmonary Artery: Indeterminant PASP, inadequate TR jet. Venous: The inferior vena cava is normal in size with greater than 50% respiratory variability, suggesting right atrial pressure of 3 mmHg. IAS/Shunts: No atrial level shunt detected by color flow Doppler.  LEFT VENTRICLE PLAX 2D LVIDd:         3.63 cm  Diastology LVIDs:         2.39 cm  LV e' medial:    5.66 cm/s LV PW:         1.16 cm  LV E/e' medial:  9.8 LV IVS:  1.13 cm  LV e' lateral:   10.00 cm/s LVOT diam:     2.00 cm  LV E/e' lateral: 5.5 LV SV:         57 LV SV Index:   27 LVOT Area:     3.14 cm  RIGHT VENTRICLE RV Basal diam:  3.96 cm RV S prime:     13.20 cm/s TAPSE (M-mode): 2.3 cm LEFT ATRIUM             Index       RIGHT ATRIUM           Index LA diam:        3.50 cm 1.69 cm/m  RA Area:     18.40 cm LA Vol (A2C):   55.9 ml 26.95 ml/m RA Volume:   51.60 ml  24.88 ml/m LA Vol (A4C):   42.8 ml 20.64 ml/m LA Biplane Vol:  51.7 ml 24.93 ml/m  AORTIC VALVE AV Area (Vmax):    2.12 cm AV Area (Vmean):   2.49 cm AV Area (VTI):     2.35 cm AV Vmax:           158.22 cm/s AV Vmean:          107.448 cm/s AV VTI:            0.242 m AV Peak Grad:      10.0 mmHg AV Mean Grad:      5.2 mmHg LVOT Vmax:         107.00 cm/s LVOT Vmean:        85.000 cm/s LVOT VTI:          0.181 m LVOT/AV VTI ratio: 0.75  AORTA Ao Root diam: 2.50 cm MITRAL VALVE               TRICUSPID VALVE MV Area (PHT): 5.46 cm    TR Peak grad:   24.0 mmHg MV Decel Time: 139 msec    TR Vmax:        245.00 cm/s MV E velocity: 55.20 cm/s MV A velocity: 83.70 cm/s  SHUNTS MV E/A ratio:  0.66        Systemic VTI:  0.18 m                            Systemic Diam: 2.00 cm Carlyle Dolly MD Electronically signed by Carlyle Dolly MD Signature Date/Time: 05/11/2021/5:26:32 PM    Final         Scheduled Meds:  Chlorhexidine Gluconate Cloth  6 each Topical Daily   insulin aspart  0-9 Units Subcutaneous Q6H   Continuous Infusions:  ampicillin-sulbactam (UNASYN) IV 200 mL/hr at 05/12/21 1042   azithromycin 500 mg (05/12/21 1327)   dextrose 5% lactated ringers Stopped (05/12/21 1034)     LOS: 2 days    Time spent: 35 minutes    Chakita Mcgraw Darleen Crocker, DO [facility]  If 7PM-7AM, please contact night-coverage [email or web address] 05/12/2021, 1:55 PM

## 2021-05-12 NOTE — Progress Notes (Signed)
Patient more coherent today. Making conversation. Asked for water. Bedside swallow eval performed by this RN with just ice water. Patient swallowed well. Did not cough while drinking the water.

## 2021-05-12 NOTE — Plan of Care (Signed)

## 2021-05-12 NOTE — Progress Notes (Addendum)
Subjective:  Patient has no complaints.  She keeps her eyes closed but does respond to vocal stimuli.  She is complaining of pain when nursing staff pricked her finger to check her glucose.  Current Medications:  Current Facility-Administered Medications:    Ampicillin-Sulbactam (UNASYN) 3 g in sodium chloride 0.9 % 100 mL IVPB, 3 g, Intravenous, Q12H, Shah, Pratik D, DO, Last Rate: 200 mL/hr at 05/12/21 1042, Infusion Verify at 05/12/21 1042   azithromycin (ZITHROMAX) 500 mg in sodium chloride 0.9 % 250 mL IVPB, 500 mg, Intravenous, Q24H, Emokpae, Ejiroghene E, MD, Stopped at 05/11/21 1431   Chlorhexidine Gluconate Cloth 2 % PADS 6 each, 6 each, Topical, Daily, Emokpae, Ejiroghene E, MD, 6 each at 05/12/21 0843   dextrose 5 % in lactated ringers infusion, , Intravenous, Continuous, Shah, Pratik D, DO, Stopped at 05/12/21 1034   insulin aspart (novoLOG) injection 0-9 Units, 0-9 Units, Subcutaneous, Q6H, Emokpae, Ejiroghene E, MD   ondansetron (ZOFRAN) tablet 4 mg, 4 mg, Oral, Q6H PRN **OR** ondansetron (ZOFRAN) injection 4 mg, 4 mg, Intravenous, Q6H PRN, Emokpae, Ejiroghene E, MD   polyethylene glycol (MIRALAX / GLYCOLAX) packet 17 g, 17 g, Oral, Daily PRN, Emokpae, Ejiroghene E, MD   Objective: Blood pressure (!) 143/76, pulse (!) 124, temperature (!) 100.7 F (38.2 C), temperature source Axillary, resp. rate (!) 26, height $RemoveBe'5\' 8"'[id number]$  (1.727 m), weight 93.9 kg, SpO2 (!) 84 %. Patient keeps her eyes closed but she does respond to vocal stimuli.  Cardiac exam with regular rhythm normal S1 and S2. No murmur or gallop noted. Lungs are clear to auscultation. Abdomen is full but soft and nontender with organomegaly or masses. No LE edema or clubbing noted.  Labs/studies Results:   CBC Latest Ref Rng & Units 05/12/2021 05/11/2021 05/10/2021  WBC 4.0 - 10.5 K/uL 6.1 6.7 5.4  Hemoglobin 12.0 - 15.0 g/dL 12.5 12.3 14.9  Hematocrit 36.0 - 46.0 % 40.0 38.5 45.8  Platelets 150 - 400 K/uL 75(L) 80(L)  90(L)    CMP Latest Ref Rng & Units 05/12/2021 05/11/2021 05/10/2021  Glucose 70 - 99 mg/dL 100(H) 84 99  BUN 6 - 20 mg/dL 49(H) 70(H) 74(H)  Creatinine 0.44 - 1.00 mg/dL 1.80(H) 3.36(H) 4.01(H)  Sodium 135 - 145 mmol/L 147(H) 141 139  Potassium 3.5 - 5.1 mmol/L 5.0 4.8 4.7  Chloride 98 - 111 mmol/L 119(H) 111 108  CO2 22 - 32 mmol/L 22 20(L) 23  Calcium 8.9 - 10.3 mg/dL 8.0(L) 7.7(L) 7.6(L)  Total Protein 6.5 - 8.1 g/dL 5.7(L) 6.1(L) 6.5  Total Bilirubin 0.3 - 1.2 mg/dL 0.2(L) 0.3 0.1(L)  Alkaline Phos 38 - 126 U/L 60 67 75  AST 15 - 41 U/L 522(H) 729(H) 776(H)  ALT 0 - 44 U/L 181(H) 224(H) 242(H)    Hepatic Function Latest Ref Rng & Units 05/12/2021 05/11/2021 05/10/2021  Total Protein 6.5 - 8.1 g/dL 5.7(L) 6.1(L) 6.5  Albumin 3.5 - 5.0 g/dL 2.6(L) 2.8(L) 3.0(L)  AST 15 - 41 U/L 522(H) 729(H) 776(H)  ALT 0 - 44 U/L 181(H) 224(H) 242(H)  Alk Phosphatase 38 - 126 U/L 60 67 75  Total Bilirubin 0.3 - 1.2 mg/dL 0.2(L) 0.3 0.1(L)    Serum iron 32, TIBC 220 and saturation 15% Serum ferritin 604. Ceruloplasmin 34.6.  It is normal. Serum IgG 1074, serum IgA 164 and serum IgM 28  HSV-1 IgG antibody 2.41(ULN 0.90) HSV-2 IgG antibody negative HSV-1 and HSV-2 IgM antibodies pending  CMV IgM antibody negative  ANA smooth muscle antibody  and mitochondrial antibody pending.  Prolactin level yesterday was 7.79  Blood cultures and urine cultures are pending.  Assessment:  #1.  Hepatocellular injury.  Transaminases are coming down.  Work-up so far has been negative.  HSV 1 IgG antibodies positive but IgM antibody is negative.  Autoimmune markers are also pending.  Based on imaging studies she does not have obstructive process.  I wonder if we are dealing with ischemic injury or injury secondary to medications.  Reassuring to note that transaminases are trending downwards.  Serum ferritin is mildly elevated which would be due to acute hepatic injury and not indicative of iron overload.  Serum  iron and TIBC are low.  #2.  Mental status changes.  Serum ammonia was normal.  No history of chronic liver disease.  Therefore mental status changes not secondary to hepatic encephalopathy.  Head CT without acute abnormalities.  Mental status changes may be due to medications dehydration and acute illness.  Mental status is improving.  #3.  Pneumonia.  Patient is on Unasyn and azithromycin.  #4.  Acute kidney injury.  Significant improvement in renal function with hydration.  #5.  History of HIV.  Patient is on therapy and HIV RNA was undetectable in February 2022.   #6.  Elevated troponin levels.  Echo yesterday did not reveal any wall motion abnormalities.  #7.  Elevated prolactin levels suggestive of sepsis source of which may be pneumonia.  #8.  Thrombocytopenia possibly due to sepsis.   Recommendations  Repeat LFTs in AM. Wait for results of pending studies.

## 2021-05-13 DIAGNOSIS — R400 Somnolence: Secondary | ICD-10-CM | POA: Diagnosis not present

## 2021-05-13 LAB — COMPREHENSIVE METABOLIC PANEL
ALT: 160 U/L — ABNORMAL HIGH (ref 0–44)
AST: 386 U/L — ABNORMAL HIGH (ref 15–41)
Albumin: 2.4 g/dL — ABNORMAL LOW (ref 3.5–5.0)
Alkaline Phosphatase: 54 U/L (ref 38–126)
Anion gap: 6 (ref 5–15)
BUN: 29 mg/dL — ABNORMAL HIGH (ref 6–20)
CO2: 24 mmol/L (ref 22–32)
Calcium: 8.4 mg/dL — ABNORMAL LOW (ref 8.9–10.3)
Chloride: 119 mmol/L — ABNORMAL HIGH (ref 98–111)
Creatinine, Ser: 1.04 mg/dL — ABNORMAL HIGH (ref 0.44–1.00)
GFR, Estimated: >60 mL/min (ref >60)
Glucose, Bld: 132 mg/dL — ABNORMAL HIGH (ref 70–99)
Potassium: 4.6 mmol/L (ref 3.5–5.1)
Sodium: 149 mmol/L — ABNORMAL HIGH (ref 135–145)
Total Bilirubin: 0.2 mg/dL — ABNORMAL LOW (ref 0.3–1.2)
Total Protein: 5.5 g/dL — ABNORMAL LOW (ref 6.5–8.1)

## 2021-05-13 LAB — GLUCOSE, CAPILLARY
Glucose-Capillary: 103 mg/dL — ABNORMAL HIGH (ref 70–99)
Glucose-Capillary: 105 mg/dL — ABNORMAL HIGH (ref 70–99)
Glucose-Capillary: 137 mg/dL — ABNORMAL HIGH (ref 70–99)
Glucose-Capillary: 143 mg/dL — ABNORMAL HIGH (ref 70–99)
Glucose-Capillary: 97 mg/dL (ref 70–99)

## 2021-05-13 LAB — EBV AB TO VIRAL CAPSID AG PNL, IGG+IGM
EBV VCA IgG: >600 U/mL — ABNORMAL HIGH (ref 0.0–17.9)
EBV VCA IgM: <36 U/mL (ref 0.0–35.9)

## 2021-05-13 LAB — CBC
HCT: 37.9 % (ref 36.0–46.0)
Hemoglobin: 11.7 g/dL — ABNORMAL LOW (ref 12.0–15.0)
MCH: 31.5 pg (ref 26.0–34.0)
MCHC: 30.9 g/dL (ref 30.0–36.0)
MCV: 102.2 fL — ABNORMAL HIGH (ref 80.0–100.0)
Platelets: 87 10*3/uL — ABNORMAL LOW (ref 150–400)
RBC: 3.71 MIL/uL — ABNORMAL LOW (ref 3.87–5.11)
RDW: 16.9 % — ABNORMAL HIGH (ref 11.5–15.5)
WBC: 7.4 10*3/uL (ref 4.0–10.5)
nRBC: 0.3 % — ABNORMAL HIGH (ref 0.0–0.2)

## 2021-05-13 LAB — HSV(HERPES SIMPLEX VRS) I + II AB-IGM: HSVI/II Comb IgM: <0.91 Ratio (ref 0.00–0.90)

## 2021-05-13 LAB — ANA: Anti Nuclear Antibody (ANA): NEGATIVE

## 2021-05-13 LAB — MAGNESIUM: Magnesium: 2 mg/dL (ref 1.7–2.4)

## 2021-05-13 MED ORDER — SODIUM CHLORIDE 0.45 % IV SOLN: INTRAVENOUS | Status: DC

## 2021-05-13 MED ORDER — METOPROLOL TARTRATE 25 MG PO TABS
25.0000 mg | ORAL_TABLET | Freq: Two times a day (BID) | ORAL | Status: DC
  Administered 2021-05-13 – 2021-05-15 (×5): 25 mg via ORAL
  Filled 2021-05-13 (×5): qty 1

## 2021-05-13 NOTE — Plan of Care (Signed)

## 2021-05-13 NOTE — Progress Notes (Signed)
Patient had large brown bowel movement. Charting for GI.

## 2021-05-13 NOTE — Progress Notes (Signed)
PROGRESS NOTE    Karina Clarke  HUD:[id number] DOB: Dec 25, 1960 DOA: 05/10/2021 PCP: Rosita Fire, MD   Brief Narrative:   Karina Clarke is a 60 y.o. female with medical history significant for schizophrenia, HIV, COPD.  Patient was brought from nursing home with reports of unresponsiveness, with possible overdose.  She was given some Narcan prior to arrival as well as in the ED with some improvement in responsiveness.  She has been admitted with acute metabolic encephalopathy likely multifactorial in the setting of pneumonia, azotemia/AKI, and elevated liver enzymes.  GI consulted to assist with evaluation of liver injury with labs showing signs of improvement.  Assessment & Plan:   Principal Problem:   AMS (altered mental status) Active Problems:   HIV (human immunodeficiency virus infection) ([place])   Schizophrenia ([place])   Diabetes mellitus, new onset ([place])   AKI (acute kidney injury) ([place])   PNA (pneumonia)   Transaminitis   Acute metabolic encephalopathy-multifactorial -Likely related to metabolic derangements that are also showing signs of improvement -Currently on full liquid diet -Head CT without acute findings -Continue to treat AKI as well as sepsis -UDS only positive for benzodiazepines with Xanax prescription outpatient -PT evaluation   AKI-improving -Baseline 1-1.3 -Continue on D5LR -Recheck a.m. labs -Monitor urine output   Severe sepsis secondary to pneumonia and also possible UTI -Continue IV Unasyn and azithromycin due to suspicion of aspiration -Urine cultures no growth -Blood cultures negative   Transaminitis-improving -Possibly due to drug-induced liver injury versus ischemia -Appreciate ongoing GI evaluation -Continue to follow a.m. labs   Hypernatremia-unchanged -Changed IV fluid to LR, follow in a.m.   Elevated troponin -2D echocardiogram 60-65% EF with grade 1 diastolic dysfunction   Thrombocytopenia-stable -Likely related to  sepsis -Continue to monitor trend -Avoid heparin agents   HIV -HIV viral load not detected 12/2020 with stable CD4 count -Hold medications for now   Schizophrenia -Resume Haldol and Depakote hopefully in the next 1-2 days as mentation further improves   Hypertension-stable   Type 2 diabetes-with hypoglycemia -Hemoglobin A1c 6.0% -Currently on D5 IV fluid   DVT prophylaxis:SCDs Code Status: Full Family Communication: None at bedside, called sister 6/17 Disposition Plan:  Status is: Inpatient   Remains inpatient appropriate because:Altered mental status, IV treatments appropriate due to intensity of illness or inability to take PO, and Inpatient level of care appropriate due to severity of illness   Dispo: The patient is from: Group home              Anticipated d/c is to: Group home              Patient currently is not medically stable to d/c.              Difficult to place patient No   Consultants:  GI   Procedures:  See below  Antimicrobials:  Anti-infectives (From admission, onward)    Start     Dose/Rate Route Frequency Ordered Stop   05/12/21 1834  Ampicillin-Sulbactam (UNASYN) 3 g in sodium chloride 0.9 % 100 mL IVPB        3 g 200 mL/hr over 30 Minutes Intravenous Every 8 hours 05/12/21 1409     05/11/21 1200  Ampicillin-Sulbactam (UNASYN) 3 g in sodium chloride 0.9 % 100 mL IVPB  Status:  Discontinued        3 g 200 mL/hr over 30 Minutes Intravenous Every 12 hours 05/11/21 1159 05/12/21 1409   05/10/21 1915  Ampicillin-Sulbactam (UNASYN) 3 g in sodium  chloride 0.9 % 100 mL IVPB  Status:  Discontinued        3 g 200 mL/hr over 30 Minutes Intravenous Every 24 hours 05/10/21 1900 05/11/21 1159   05/10/21 1430  cefTRIAXone (ROCEPHIN) 2 g in sodium chloride 0.9 % 100 mL IVPB  Status:  Discontinued        2 g 200 mL/hr over 30 Minutes Intravenous Every 24 hours 05/10/21 1417 05/10/21 1849   05/10/21 1430  azithromycin (ZITHROMAX) 500 mg in sodium chloride 0.9 %  250 mL IVPB        500 mg 250 mL/hr over 60 Minutes Intravenous Every 24 hours 05/10/21 1417         Subjective: Patient seen and evaluated today and continues to remain mostly somnolent, but is arousable to questioning.  Objective: Vitals:   05/13/21 0737 05/13/21 0900 05/13/21 1109 05/13/21 1200  BP:  (!) 144/91  136/75  Pulse: (!) 113 (!) 115 93 88  Resp: (!) 21 20 16 19   Temp: 99.7 F (37.6 C)  99.8 F (37.7 C)   TempSrc: Axillary  Axillary   SpO2: 94% 93% 96% 96%  Weight:      Height:        Intake/Output Summary (Last 24 hours) at 05/13/2021 1341 Last data filed at 05/13/2021 0725 Gross per 24 hour  Intake 2135.29 ml  Output 1050 ml  Net 1085.29 ml   Filed Weights   05/10/21 1230 05/11/21 1237 05/13/21 0500  Weight: 99.8 kg 93.9 kg 94.4 kg    Examination:  General exam: Appears lethargic/somnolent, but easily arousable and answers questions Respiratory system: Clear to auscultation. Respiratory effort normal.  1 L nasal cannula oxygen. Cardiovascular system: S1 & S2 heard, RRR.  Gastrointestinal system: Abdomen is soft Central nervous system: Lethargic/somnolent Extremities: No edema Skin: No significant lesions noted Psychiatry: Flat affect.    Data Reviewed: I have personally reviewed following labs and imaging studies  CBC: Recent Labs  Lab 05/10/21 1231 05/11/21 0418 05/12/21 0352 05/13/21 0436  WBC 5.4 6.7 6.1 7.4  NEUTROABS 3.0  --   --   --   HGB 14.9 12.3 12.5 11.7*  HCT 45.8 38.5 40.0 37.9  MCV 97.9 100.5* 101.8* 102.2*  PLT 90* 80* 75* 87*   Basic Metabolic Panel: Recent Labs  Lab 05/10/21 1424 05/10/21 2254 05/11/21 0418 05/12/21 0352 05/13/21 0436  NA 138 139 141 147* 149*  K 4.7 4.7 4.8 5.0 4.6  CL 102 108 111 119* 119*  CO2 22 23 20* 22 24  GLUCOSE 97 99 84 100* 132*  BUN 83* 74* 70* 49* 29*  CREATININE 5.15* 4.01* 3.36* 1.80* 1.04*  CALCIUM 7.8* 7.6* 7.7* 8.0* 8.4*  MG  --   --   --  2.5* 2.0   GFR: Estimated  Creatinine Clearance: 71.7 mL/min (A) (by C-G formula based on SCr of 1.04 mg/dL (H)). Liver Function Tests: Recent Labs  Lab 05/10/21 1424 05/10/21 2254 05/11/21 0418 05/12/21 0352 05/13/21 0436  AST 913* 776* 729* 522* 386*  ALT 272* 242* 224* 181* 160*  ALKPHOS 82 75 67 60 54  BILITOT 0.3 0.1* 0.3 0.2* 0.2*  PROT 7.2 6.5 6.1* 5.7* 5.5*  ALBUMIN 3.3* 3.0* 2.8* 2.6* 2.4*   No results for input(s): LIPASE, AMYLASE in the last 168 hours. Recent Labs  Lab 05/10/21 1424 05/11/21 1154  AMMONIA 20 30   Coagulation Profile: Recent Labs  Lab 05/10/21 1424 05/10/21 2254 05/11/21 1154  INR 1.0 1.0 1.1  Cardiac Enzymes: No results for input(s): CKTOTAL, CKMB, CKMBINDEX, TROPONINI in the last 168 hours. BNP (last 3 results) No results for input(s): PROBNP in the last 8760 hours. HbA1C: Recent Labs    05/11/21 0418  HGBA1C 6.0*   CBG: Recent Labs  Lab 05/12/21 1214 05/12/21 1645 05/13/21 0036 05/13/21 0652 05/13/21 1108  GLUCAP 101* 142* 97 103* 137*   Lipid Profile: No results for input(s): CHOL, HDL, LDLCALC, TRIG, CHOLHDL, LDLDIRECT in the last 72 hours. Thyroid Function Tests: No results for input(s): TSH, T4TOTAL, FREET4, T3FREE, THYROIDAB in the last 72 hours. Anemia Panel: Recent Labs    05/11/21 1316  FERRITIN 604*  TIBC 220*  IRON 32   Sepsis Labs: Recent Labs  Lab 05/10/21 1231 05/10/21 1424 05/11/21 1151  PROCALCITON  --   --  7.79  LATICACIDVEN 2.0* 1.7  --     Recent Results (from the past 240 hour(s))  Blood culture (routine single)     Status: None (Preliminary result)   Collection Time: 05/10/21 12:31 PM   Specimen: BLOOD  Result Value Ref Range Status   Specimen Description BLOOD LEFT ANTECUBITAL  Final   Special Requests   Final    BOTTLES DRAWN AEROBIC AND ANAEROBIC Blood Culture adequate volume   Culture   Final    NO GROWTH 3 DAYS Performed at [facility], [address] [place], [place] [address]    Report Status PENDING   Incomplete  Urine culture     Status: None   Collection Time: 05/10/21 12:31 PM   Specimen: In/Out Cath Urine  Result Value Ref Range Status   Specimen Description   Final    IN/OUT CATH URINE Performed at [facility], [address], [place], [place] [address]    Special Requests   Final    NONE Performed at [facility], [address], [place], [place] [address]    Culture   Final    NO GROWTH Performed at [facility], [place] [address] [place], [place] [address]    Report Status 05/12/2021 FINAL  Final  Resp Panel by RT-PCR (Flu A&B, Covid) Nasopharyngeal Swab     Status: None   Collection Time: 05/10/21  2:58 PM   Specimen: Nasopharyngeal Swab; Nasopharyngeal(NP) swabs in vial transport medium  Result Value Ref Range Status   SARS Coronavirus 2 by RT PCR NEGATIVE NEGATIVE Final    Comment: (NOTE) SARS-CoV-2 target nucleic acids are NOT DETECTED.  The SARS-CoV-2 RNA is generally detectable in upper respiratory specimens during the acute phase of infection. The lowest concentration of SARS-CoV-2 viral copies this assay can detect is 138 copies/mL. A negative result does not preclude SARS-Cov-2 infection and should not be used as the sole basis for treatment or other patient management decisions. A negative result may occur with  improper specimen collection/handling, submission of specimen other than nasopharyngeal swab, presence of viral mutation(s) within the areas targeted by this assay, and inadequate number of viral copies(<138 copies/mL). A negative result must be combined with clinical observations, patient history, and epidemiological information. The expected result is Negative.  Fact Sheet for Patients:  EntrepreneurPulse.com.au  Fact Sheet for Healthcare Providers:  IncredibleEmployment.be  This test is no t yet approved or cleared by the [place] FDA and  has been authorized for detection and/or diagnosis of  SARS-CoV-2 by FDA under an Emergency Use Authorization (EUA). This EUA will remain  in effect (meaning this test can be used) for the duration of the COVID-19  declaration under Section 564(b)(1) of the Act, 21 U.S.C.section 360bbb-3(b)(1), unless the authorization is terminated  or revoked sooner.       Influenza A by PCR NEGATIVE NEGATIVE Final   Influenza B by PCR NEGATIVE NEGATIVE Final    Comment: (NOTE) The Xpert Xpress SARS-CoV-2/FLU/RSV plus assay is intended as an aid in the diagnosis of influenza from Nasopharyngeal swab specimens and should not be used as a sole basis for treatment. Nasal washings and aspirates are unacceptable for Xpert Xpress SARS-CoV-2/FLU/RSV testing.  Fact Sheet for Patients: EntrepreneurPulse.com.au  Fact Sheet for Healthcare Providers: IncredibleEmployment.be  This test is not yet approved or cleared by the [place] FDA and has been authorized for detection and/or diagnosis of SARS-CoV-2 by FDA under an Emergency Use Authorization (EUA). This EUA will remain in effect (meaning this test can be used) for the duration of the COVID-19 declaration under Section 564(b)(1) of the Act, 21 U.S.C. section 360bbb-3(b)(1), unless the authorization is terminated or revoked.  Performed at [facility], [address] [place], [place] [address]   MRSA Next Gen by PCR, Nasal     Status: None   Collection Time: 05/11/21 12:45 PM   Specimen: Nasal Mucosa; Nasal Swab  Result Value Ref Range Status   MRSA by PCR Next Gen NOT DETECTED NOT DETECTED Final    Comment: (NOTE) The GeneXpert MRSA Assay (FDA approved for NASAL specimens only), is one component of a comprehensive MRSA colonization surveillance program. It is not intended to diagnose MRSA infection nor to guide or monitor treatment for MRSA infections. Test performance is not FDA approved in patients less than 15 years old. Performed at [facility], [address] [place], [place] [address]          Radiology Studies: ECHOCARDIOGRAM COMPLETE  Result Date: 05/11/2021    ECHOCARDIOGRAM REPORT   Patient Name:   CUMA POLYAKOV Date of Exam: 05/11/2021 Medical Rec #:  [id number]      Height:       68.0 in Accession #:    [id number]     Weight:       207.0 lb Date of Birth:  1961/10/31      BSA:          2.074 m Patient Age:    41 years       BP:           110/69 mmHg Patient Gender: F              HR:           100 bpm. Exam Location:  [facility] Procedure: 2D Echo, Cardiac Doppler and Color Doppler Indications:    Elevated Troponin  History:        Patient has prior history of Echocardiogram examinations, most                 recent 10/17/2017. COPD; Signs/Symptoms:Altered Mental Status.                 Sepsis, elevated Troponin, schizophrenia, HIV, pneumonia.  Sonographer:    Dustin Flock RDCS Referring Phys: [place]  1. Left ventricular ejection fraction, by estimation, is 60 to 65%. The left ventricle has normal function. The left ventricle has no regional wall motion abnormalities. There is mild left ventricular hypertrophy. Left ventricular diastolic parameters are consistent with Grade I diastolic dysfunction (impaired relaxation).  2. Right ventricular systolic function is mildly reduced. The right ventricular size is moderately enlarged.  There is normal pulmonary artery systolic pressure.  3. The mitral valve is normal in structure. No evidence of mitral valve regurgitation. No evidence of mitral stenosis.  4. The aortic valve is tricuspid. Aortic valve regurgitation is not visualized. No aortic stenosis is present.  5. The inferior vena cava is normal in size with greater than 50% respiratory variability, suggesting right atrial pressure of 3 mmHg. FINDINGS  Left Ventricle: Left ventricular ejection fraction, by estimation, is 60 to 65%. The left ventricle has normal function. The left ventricle has no regional wall motion  abnormalities. The left ventricular internal cavity size was normal in size. There is  mild left ventricular hypertrophy. Left ventricular diastolic parameters are consistent with Grade I diastolic dysfunction (impaired relaxation). Normal left ventricular filling pressure. Right Ventricle: The right ventricular size is moderately enlarged. Right vetricular wall thickness was not assessed. Right ventricular systolic function is mildly reduced. There is normal pulmonary artery systolic pressure. The tricuspid regurgitant velocity is 2.45 m/s, and with an assumed right atrial pressure of 3 mmHg, the estimated right ventricular systolic pressure is 78.9 mmHg. Left Atrium: Left atrial size was normal in size. Right Atrium: Right atrial size was normal in size. Pericardium: There is no evidence of pericardial effusion. Mitral Valve: The mitral valve is normal in structure. No evidence of mitral valve regurgitation. No evidence of mitral valve stenosis. Tricuspid Valve: The tricuspid valve is not well visualized. Tricuspid valve regurgitation is trivial. No evidence of tricuspid stenosis. Aortic Valve: The aortic valve is tricuspid. Aortic valve regurgitation is not visualized. No aortic stenosis is present. Aortic valve mean gradient measures 5.2 mmHg. Aortic valve peak gradient measures 10.0 mmHg. Aortic valve area, by VTI measures 2.35  cm. Pulmonic Valve: The pulmonic valve was not well visualized. Pulmonic valve regurgitation is not visualized. No evidence of pulmonic stenosis. Aorta: The aortic root is normal in size and structure. Pulmonary Artery: Indeterminant PASP, inadequate TR jet. Venous: The inferior vena cava is normal in size with greater than 50% respiratory variability, suggesting right atrial pressure of 3 mmHg. IAS/Shunts: No atrial level shunt detected by color flow Doppler.  LEFT VENTRICLE PLAX 2D LVIDd:         3.63 cm  Diastology LVIDs:         2.39 cm  LV e' medial:    5.66 cm/s LV PW:          1.16 cm  LV E/e' medial:  9.8 LV IVS:        1.13 cm  LV e' lateral:   10.00 cm/s LVOT diam:     2.00 cm  LV E/e' lateral: 5.5 LV SV:         57 LV SV Index:   27 LVOT Area:     3.14 cm  RIGHT VENTRICLE RV Basal diam:  3.96 cm RV S prime:     13.20 cm/s TAPSE (M-mode): 2.3 cm LEFT ATRIUM             Index       RIGHT ATRIUM           Index LA diam:        3.50 cm 1.69 cm/m  RA Area:     18.40 cm LA Vol (A2C):   55.9 ml 26.95 ml/m RA Volume:   51.60 ml  24.88 ml/m LA Vol (A4C):   42.8 ml 20.64 ml/m LA Biplane Vol: 51.7 ml 24.93 ml/m  AORTIC VALVE AV Area (Vmax):  2.12 cm AV Area (Vmean):   2.49 cm AV Area (VTI):     2.35 cm AV Vmax:           158.22 cm/s AV Vmean:          107.448 cm/s AV VTI:            0.242 m AV Peak Grad:      10.0 mmHg AV Mean Grad:      5.2 mmHg LVOT Vmax:         107.00 cm/s LVOT Vmean:        85.000 cm/s LVOT VTI:          0.181 m LVOT/AV VTI ratio: 0.75  AORTA Ao Root diam: 2.50 cm MITRAL VALVE               TRICUSPID VALVE MV Area (PHT): 5.46 cm    TR Peak grad:   24.0 mmHg MV Decel Time: 139 msec    TR Vmax:        245.00 cm/s MV E velocity: 55.20 cm/s MV A velocity: 83.70 cm/s  SHUNTS MV E/A ratio:  0.66        Systemic VTI:  0.18 m                            Systemic Diam: 2.00 cm Carlyle Dolly MD Electronically signed by Carlyle Dolly MD Signature Date/Time: 05/11/2021/5:26:32 PM    Final         Scheduled Meds:  Chlorhexidine Gluconate Cloth  6 each Topical Daily   insulin aspart  0-9 Units Subcutaneous Q6H   metoprolol tartrate  25 mg Oral BID   Continuous Infusions:  ampicillin-sulbactam (UNASYN) IV 3 g (05/13/21 0932)   azithromycin Stopped (05/12/21 1428)   dextrose 5% lactated ringers 75 mL/hr at 05/13/21 0725     LOS: 3 days    Time spent: 35 minutes    Karmelo Bass D Manuella Ghazi, DO [facility]  If 7PM-7AM, please contact night-coverage [email or web address] 05/13/2021, 1:41 PM

## 2021-05-13 NOTE — Progress Notes (Signed)
Subjective:  Patient has no complaints.  She keeps her eyes closed but does respond to vocal stimuli.  She is complaining of pain when nursing staff pricked her finger to check her glucose.  Current Medications:  Current Facility-Administered Medications:    acetaminophen (TYLENOL) tablet 650 mg, 650 mg, Oral, Q6H PRN, Manuella Ghazi, Pratik D, DO, 650 mg at 05/13/21 0040   Ampicillin-Sulbactam (UNASYN) 3 g in sodium chloride 0.9 % 100 mL IVPB, 3 g, Intravenous, Q8H, Shah, Pratik D, DO, Last Rate: 200 mL/hr at 05/13/21 0932, 3 g at 05/13/21 0932   azithromycin (ZITHROMAX) 500 mg in sodium chloride 0.9 % 250 mL IVPB, 500 mg, Intravenous, Q24H, Emokpae, Ejiroghene E, MD, Stopped at 05/12/21 1428   Chlorhexidine Gluconate Cloth 2 % PADS 6 each, 6 each, Topical, Daily, Emokpae, Ejiroghene E, MD, 6 each at 05/13/21 0929   dextrose 5 % in lactated ringers infusion, , Intravenous, Continuous, Shah, Pratik D, DO, Last Rate: 75 mL/hr at 05/13/21 0725, Infusion Verify at 05/13/21 0725   insulin aspart (novoLOG) injection 0-9 Units, 0-9 Units, Subcutaneous, Q6H, Emokpae, Ejiroghene E, MD, 1 Units at 05/13/21 1202   metoprolol tartrate (LOPRESSOR) tablet 25 mg, 25 mg, Oral, BID, Shah, Pratik D, DO, 25 mg at 05/13/21 0928   ondansetron (ZOFRAN) tablet 4 mg, 4 mg, Oral, Q6H PRN, 4 mg at 05/13/21 0928 **OR** ondansetron (ZOFRAN) injection 4 mg, 4 mg, Intravenous, Q6H PRN, Emokpae, Ejiroghene E, MD   polyethylene glycol (MIRALAX / GLYCOLAX) packet 17 g, 17 g, Oral, Daily PRN, Emokpae, Ejiroghene E, MD   Objective: Blood pressure 136/75, pulse 88, temperature 99.8 F (37.7 C), temperature source Axillary, resp. rate 19, height _0  (1.727 m), weight 94.4 kg, SpO2 96 %. Patient keeps her eyes closed but she does respond to vocal stimuli.  Cardiac exam with regular rhythm normal S1 and S2. No murmur or gallop noted. Lungs are clear to auscultation. Abdomen is full but soft and nontender with organomegaly or  masses. No LE edema or clubbing noted.  Labs/studies Results:   CBC Latest Ref Rng & Units 05/13/2021 05/12/2021 05/11/2021  WBC 4.0 - 10.5 K/uL 7.4 6.1 6.7  Hemoglobin 12.0 - 15.0 g/dL 11.7(L) 12.5 12.3  Hematocrit 36.0 - 46.0 % 37.9 40.0 38.5  Platelets 150 - 400 K/uL 87(L) 75(L) 80(L)    CMP Latest Ref Rng & Units 05/13/2021 05/12/2021 05/11/2021  Glucose 70 - 99 mg/dL 132(H) 100(H) 84  BUN 6 - 20 mg/dL 29(H) 49(H) 70(H)  Creatinine 0.44 - 1.00 mg/dL 1.04(H) 1.80(H) 3.36(H)  Sodium 135 - 145 mmol/L 149(H) 147(H) 141  Potassium 3.5 - 5.1 mmol/L 4.6 5.0 4.8  Chloride 98 - 111 mmol/L 119(H) 119(H) 111  CO2 22 - 32 mmol/L 24 22 20(L)  Calcium 8.9 - 10.3 mg/dL 8.4(L) 8.0(L) 7.7(L)  Total Protein 6.5 - 8.1 g/dL 5.5(L) 5.7(L) 6.1(L)  Total Bilirubin 0.3 - 1.2 mg/dL 0.2(L) 0.2(L) 0.3  Alkaline Phos 38 - 126 U/L 54 60 67  AST 15 - 41 U/L 386(H) 522(H) 729(H)  ALT 0 - 44 U/L 160(H) 181(H) 224(H)    Hepatic Function Latest Ref Rng & Units 05/13/2021 05/12/2021 05/11/2021  Total Protein 6.5 - 8.1 g/dL 5.5(L) 5.7(L) 6.1(L)  Albumin 3.5 - 5.0 g/dL 2.4(L) 2.6(L) 2.8(L)  AST 15 - 41 U/L 386(H) 522(H) 729(H)  ALT 0 - 44 U/L 160(H) 181(H) 224(H)  Alk Phosphatase 38 - 126 U/L 54 60 67  Total Bilirubin 0.3 - 1.2 mg/dL 0.2(L) 0.2(L) 0.3  Serum iron 32, TIBC 220 and saturation 15% Serum ferritin 604. Ceruloplasmin 34.6.  It is normal. Serum IgG 1074, serum IgA 164 and serum IgM 28  HSV-1 IgG antibody 2.41(ULN 0.90) HSV-2 IgG antibody negative HSV-1 and HSV-2 IgM antibodies pending  CMV IgM antibody negative  ANA is negative.   Prolactin level 7.79 2 days ago.  Urine culture no growth in 3 days Blood culture no growth in 3 days.  Smooth muscle antibody and mitochondrial antibody pending.  Assessment:  #1.  Hepatocellular injury.  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  HSV IgM  antibody still pending.  Suspect ischemic injury given her presentation and certainly pneumonia could be contributing as well.  Transaminases continue to trend downwards.  #2.  Mental status changes.  Patient is more alert today than she was yesterday.  She opens her eyes and answers questions in more than 1 words.  Etiology felt to be metabolic.  She does not have hepatic encephalopathy.  #3.  Pneumonia.  Patient is on Unasyn and azithromycin.  #4.  Acute kidney injury.  Renal function has returned to baseline or close to normal.  #5.  History of HIV.  Patient is on therapy and HIV RNA was undetectable in February 2022.   #6.  Elevated troponin levels.  Echo 2 days ago was unremarkable.  Doubt cardiac source.  #7.  Thrombocytopenia possibly due to sepsis.  Platelet count remains low but stable.  Her platelet count in March 2019.  No recent platelet count available.   Recommendations  We will continue to monitor LFTs. Wait for pending results.

## 2021-05-14 DIAGNOSIS — R400 Somnolence: Secondary | ICD-10-CM | POA: Diagnosis not present

## 2021-05-14 DIAGNOSIS — R7401 Elevation of levels of liver transaminase levels: Secondary | ICD-10-CM

## 2021-05-14 LAB — COMPREHENSIVE METABOLIC PANEL
ALT: 135 U/L — ABNORMAL HIGH (ref 0–44)
AST: 253 U/L — ABNORMAL HIGH (ref 15–41)
Albumin: 2.3 g/dL — ABNORMAL LOW (ref 3.5–5.0)
Alkaline Phosphatase: 51 U/L (ref 38–126)
Anion gap: 4 — ABNORMAL LOW (ref 5–15)
BUN: 18 mg/dL (ref 6–20)
CO2: 26 mmol/L (ref 22–32)
Calcium: 8.3 mg/dL — ABNORMAL LOW (ref 8.9–10.3)
Chloride: 116 mmol/L — ABNORMAL HIGH (ref 98–111)
Creatinine, Ser: 0.83 mg/dL (ref 0.44–1.00)
GFR, Estimated: >60 mL/min (ref >60)
Glucose, Bld: 103 mg/dL — ABNORMAL HIGH (ref 70–99)
Potassium: 4.7 mmol/L (ref 3.5–5.1)
Sodium: 146 mmol/L — ABNORMAL HIGH (ref 135–145)
Total Bilirubin: 0.3 mg/dL (ref 0.3–1.2)
Total Protein: 5.4 g/dL — ABNORMAL LOW (ref 6.5–8.1)

## 2021-05-14 LAB — CBC
HCT: 36 % (ref 36.0–46.0)
Hemoglobin: 11.3 g/dL — ABNORMAL LOW (ref 12.0–15.0)
MCH: 32.2 pg (ref 26.0–34.0)
MCHC: 31.4 g/dL (ref 30.0–36.0)
MCV: 102.6 fL — ABNORMAL HIGH (ref 80.0–100.0)
Platelets: 105 10*3/uL — ABNORMAL LOW (ref 150–400)
RBC: 3.51 MIL/uL — ABNORMAL LOW (ref 3.87–5.11)
RDW: 17 % — ABNORMAL HIGH (ref 11.5–15.5)
WBC: 7.2 10*3/uL (ref 4.0–10.5)
nRBC: 0.3 % — ABNORMAL HIGH (ref 0.0–0.2)

## 2021-05-14 LAB — GLUCOSE, CAPILLARY
Glucose-Capillary: 209 mg/dL — ABNORMAL HIGH (ref 70–99)
Glucose-Capillary: 88 mg/dL (ref 70–99)
Glucose-Capillary: 89 mg/dL (ref 70–99)
Glucose-Capillary: 92 mg/dL (ref 70–99)

## 2021-05-14 LAB — MAGNESIUM: Magnesium: 1.5 mg/dL — ABNORMAL LOW (ref 1.7–2.4)

## 2021-05-14 LAB — ANTI-SMOOTH MUSCLE ANTIBODY, IGG: F-Actin IgG: 3 Units (ref 0–19)

## 2021-05-14 LAB — MITOCHONDRIAL ANTIBODIES: Mitochondrial M2 Ab, IgG: <20 Units (ref 0.0–20.0)

## 2021-05-14 MED ORDER — QUETIAPINE FUMARATE 100 MG PO TABS
200.0000 mg | ORAL_TABLET | Freq: Two times a day (BID) | ORAL | Status: DC
  Administered 2021-05-14 (×2): 200 mg via ORAL
  Filled 2021-05-14 (×2): qty 2

## 2021-05-14 MED ORDER — MAGNESIUM SULFATE 2 GM/50ML IV SOLN
2.0000 g | Freq: Once | INTRAVENOUS | Status: AC
  Administered 2021-05-14: 2 g via INTRAVENOUS
  Filled 2021-05-14: qty 50

## 2021-05-14 MED ORDER — DIVALPROEX SODIUM 250 MG PO DR TAB
500.0000 mg | DELAYED_RELEASE_TABLET | Freq: Two times a day (BID) | ORAL | Status: DC
  Administered 2021-05-14 (×2): 500 mg via ORAL
  Filled 2021-05-14 (×2): qty 2

## 2021-05-14 MED ORDER — HALOPERIDOL 5 MG PO TABS
20.0000 mg | ORAL_TABLET | Freq: Three times a day (TID) | ORAL | Status: DC
  Administered 2021-05-14 – 2021-05-15 (×4): 20 mg via ORAL
  Filled 2021-05-14 (×4): qty 4

## 2021-05-14 NOTE — Progress Notes (Signed)
    Subjective: Per nursing staff, large BM yesterday and today. No overt GI bleeding. Patient denies abdominal pain. Nausea yesterday but not today. Tolerating soft diet. Unable to provide much history. Nursing staff feels may be at baseline. Oriented to person only.   Objective: Vital signs in last 24 hours: Temp:  [98.7 F (37.1 C)-99.8 F (37.7 C)] 99 F (37.2 C) (06/20 0700) Pulse Rate:  [81-106] 81 (06/20 1000) Resp:  [16-36] 19 (06/20 1000) BP: (109-141)/(53-81) 114/64 (06/20 1000) SpO2:  [91 %-96 %] 94 % (06/20 1000) Last BM Date: 05/14/21 General:   Alert and oriented to person only. Flat affect.  Head:  Normocephalic and atraumatic. Abdomen:  Bowel sounds present, soft, non-tender, non-distended.  Extremities:  Without edema. Neurologic:  Alert and  oriented to person only.    Intake/Output from previous day: 06/19 0701 - 06/20 0700 In: 2111.6 [P.O.:360; I.V.:1201.5; IV Piggyback:550.1] Out: 601 [Urine:600; Stool:1] Intake/Output this shift: Total I/O In: 672.3 [P.O.:360; I.V.:306.2; IV Piggyback:6.2] Out: -   Lab Results: Recent Labs    05/12/21 0352 05/13/21 0436 05/14/21 0456  WBC 6.1 7.4 7.2  HGB 12.5 11.7* 11.3*  HCT 40.0 37.9 36.0  PLT 75* 87* 105*   BMET Recent Labs    05/12/21 0352 05/13/21 0436 05/14/21 0456  NA 147* 149* 146*  K 5.0 4.6 4.7  CL 119* 119* 116*  CO2 22 24 26   GLUCOSE 100* 132* 103*  BUN 49* 29* 18  CREATININE 1.80* 1.04* 0.83  CALCIUM 8.0* 8.4* 8.3*   LFT Recent Labs    05/12/21 0352 05/13/21 0436 05/14/21 0456  PROT 5.7* 5.5* 5.4*  ALBUMIN 2.6* 2.4* 2.3*  AST 522* 386* 253*  ALT 181* 160* 135*  ALKPHOS 60 54 51  BILITOT 0.2* 0.2* 0.3   PT/INR Recent Labs    05/11/21 1154  LABPROT 13.9  INR 1.1     Assessment: 60 year old female with history significant for schizophrenia, HIV, COPD, resident of [facility] and presenting with acute metabolic encephalopathy in setting of sepsis due to  pneumonia and possible UTI, with GI consulted due to acutely elevated transaminases.   Transaminitis: Initially with AST 913 and ALT 272, steadily declining this admission with evaluation thus far revealing negative acute hepatitis panel, normal ceruloplasmin, negative ANA, negative immunoglobulins, ferritin elevated at 604 likely acute reactant, iron sats 15, CMV IgM negative, HSV IgG positive and HSV IgM negative, EBV ab IgG positive (greater than 600). CT abd/pelvis without contrast with normal liver. No [place] on file this admission. Felt to possibly have ischemic injury, likely acute illness contributing. Continuing to trend down. INR has remained normal.   Mental status changes: unclear baseline but she is conversant. Only alert to person. No concerns for hepatic encephalopathy. Ammonia normal.   Thrombocytopenia: improving. Likely due to sepsis.    Plan: Follow-up on pending AMA and ASMA Check INR , HFP in am Will continue to trend HFP Continue soft diet   Annitta Needs, PhD, ANP-BC [facility]    LOS: 4 days    05/14/2021, 10:04 AM

## 2021-05-14 NOTE — Plan of Care (Signed)
  Problem: Acute Rehab PT Goals(only PT should resolve) Goal: Pt Will Go Supine/Side To Sit Outcome: Progressing Flowsheets (Taken 05/14/2021 1600) Pt will go Supine/Side to Sit: with moderate assist Goal: Patient Will Transfer Sit To/From Stand Outcome: Progressing Flowsheets (Taken 05/14/2021 1600) Patient will transfer sit to/from stand: with moderate assist Goal: Pt Will Transfer Bed To Chair/Chair To Bed Outcome: Progressing Flowsheets (Taken 05/14/2021 1600) Pt will Transfer Bed to Chair/Chair to Bed: with mod assist Goal: Pt Will Ambulate Outcome: Progressing Flowsheets (Taken 05/14/2021 1600) Pt will Ambulate:  15 feet  with moderate assist  with rolling walker   4:01 PM, 05/14/21 Lonell Grandchild, MPT Physical Therapist with [facility] [phone] office [phone] mobile phone

## 2021-05-14 NOTE — Evaluation (Signed)
Physical Therapy Evaluation Patient Details Name: Karina Clarke MRN: [id number] DOB: 1961/11/02 Today's Date: 05/14/2021   History of Present Illness  Karina Clarke is a 60 y.o. female with medical history significant for schizophrenia, HIV, COPD.  Patient was brought from nursing home with reports of unresponsiveness, with possible overdose.  The time of my evaluation, patient is somnolent.    Per chart review-on EMS arrival, patient was unresponsive, 2 mg of Narcan was given, patient started to come around and respond, speech was garbled.  I talked to staff - Rise Paganini at nursing home with family and with patient. The only medication patient takes for pain is Tylenol, patient is not on Norco or any opiates.  Patient at baseline is awake alert very conversant, and opinionated, talks a lot.  Ambulates with a walker.  Patient talked to EMS on arrival, but dropped back to sleep.  Today, patient was very sleepy, she vomited her pills, refused to eat.  Garbled speech was not noted.  Prior to today, patient has been at her baseline, no complaints, no vomiting no loose stools appetite is good.   Clinical Impression  Patient demonstrates slow labored movement for sitting up at bedside, once seated able to maintain sitting balance, at high risk for falls and unable to stand after repeated attempts due to weakness and possible apprehension.  Patient put back to bed with Max assist to reposition.  Patient will benefit from continued physical therapy in hospital and recommended venue below to increase strength, balance, endurance for safe ADLs and gait.      Follow Up Recommendations SNF    Equipment Recommendations  None recommended by PT    Recommendations for Other Services       Precautions / Restrictions Precautions Precautions: Fall Restrictions Weight Bearing Restrictions: No      Mobility  Bed Mobility Overal bed mobility: Needs Assistance Bed Mobility: Supine to Sit;Sit to Supine      Supine to sit: Max assist Sit to supine: Max assist        Transfers                    Ambulation/Gait                Stairs            Wheelchair Mobility    Modified Rankin (Stroke Patients Only)       Balance Overall balance assessment: Needs assistance Sitting-balance support: Feet supported;No upper extremity supported Sitting balance-Leahy Scale: Fair Sitting balance - Comments: fair/good seated at EOB                                     Pertinent Vitals/Pain Pain Assessment: Faces Faces Pain Scale: Hurts even more Pain Location: pressure or movement of BLE Pain Descriptors / Indicators: Grimacing;Guarding Pain Intervention(s): Limited activity within patient's tolerance;Monitored during session;Repositioned    Home Living Family/patient expects to be discharged to:: Group home                      Prior Function Level of Independence: Needs assistance   Gait / Transfers Assistance Needed: household ambulator using RW  ADL's / Homemaking Assistance Needed: assisted by group home staff        Hand Dominance   Dominant Hand: Right    Extremity/Trunk Assessment   Upper Extremity Assessment Upper Extremity Assessment: Generalized weakness  Lower Extremity Assessment Lower Extremity Assessment: Generalized weakness    Cervical / Trunk Assessment Cervical / Trunk Assessment: Kyphotic  Communication      Cognition Arousal/Alertness: Awake/alert Behavior During Therapy: Anxious Overall Cognitive Status: History of cognitive impairments - at baseline                                        General Comments      Exercises     Assessment/Plan    PT Assessment Patient needs continued PT services  PT Problem List Decreased strength;Decreased activity tolerance;Decreased balance;Decreased mobility       PT Treatment Interventions DME instruction;Gait training;Stair  training;Functional mobility training;Therapeutic activities;Therapeutic exercise;Balance training;Patient/family education    PT Goals (Current goals can be found in the Care Plan section)  Acute Rehab PT Goals Patient Stated Goal: not stated PT Goal Formulation: With patient Time For Goal Achievement: 05/28/21 Potential to Achieve Goals: Fair    Frequency Min 3X/week   Barriers to discharge        Co-evaluation               AM-PAC PT "6 Clicks" Mobility  Outcome Measure Help needed turning from your back to your side while in a flat bed without using bedrails?: A Lot Help needed moving from lying on your back to sitting on the side of a flat bed without using bedrails?: A Lot Help needed moving to and from a bed to a chair (including a wheelchair)?: Total Help needed standing up from a chair using your arms (e.g., wheelchair or bedside chair)?: Total Help needed to walk in hospital room?: Total Help needed climbing 3-5 steps with a railing? : Total 6 Click Score: 8    End of Session Equipment Utilized During Treatment: Oxygen Activity Tolerance: Patient tolerated treatment well;Patient limited by fatigue Patient left: in bed;with call bell/phone within reach Nurse Communication: Mobility status PT Visit Diagnosis: Unsteadiness on feet (R26.81);Other abnormalities of gait and mobility (R26.89);Muscle weakness (generalized) (M62.81)    Time: 1610-9604 PT Time Calculation (min) (ACUTE ONLY): 20 min   Charges:   PT Evaluation $PT Eval Moderate Complexity: 1 Mod PT Treatments $Therapeutic Activity: 8-22 mins        3:59 PM, 05/14/21 Lonell Grandchild, MPT Physical Therapist with [facility] [phone] office [phone] mobile phone

## 2021-05-14 NOTE — Plan of Care (Signed)

## 2021-05-14 NOTE — Evaluation (Signed)
Clinical/Bedside Swallow Evaluation Patient Details  Name: Courtnei Ruddell MRN: [id number] Date of Birth: 28-Jun-1961  Today's Date: 05/14/2021 Time: SLP Start Time (ACUTE ONLY): 1115 SLP Stop Time (ACUTE ONLY): 1131 SLP Time Calculation (min) (ACUTE ONLY): 16 min  Past Medical History:  Past Medical History:  Diagnosis Date   Atypical lobular hyperplasia of left breast 06/2015   Bloating    per sister   Chronic right hip pain    sister states circulation problem in hip   Family history of adverse reaction to anesthesia    sister has hx. of post-op N/V   High cholesterol    HIV (human immunodeficiency virus infection) ([place])    Hypertension    Limp    Mild intellectual disability    No natural teeth    does not wear her dentures   Paranoid schizophrenia ([place])    Personality disorder ([place])    Schizophrenia ([place])    Past Surgical History:  Past Surgical History:  Procedure Laterality Date   BREAST LUMPECTOMY WITH NEEDLE LOCALIZATION Left 07/28/2015   Procedure: BREAST LUMPECTOMY WITH NEEDLE LOCALIZATION;  Surgeon: Autumn Messing III, MD;  Location: [facility];  Service: General;  Laterality: Left;   COLONOSCOPY WITH PROPOFOL N/A 06/13/2015   SLF: 1. the left colon is redundant 2. the examination was otherwise normal 3. small internal hemorrhoids 4. moderate sized external hemorrhoids   HEMORRHOID SURGERY     UMBILICAL HERNIA REPAIR     HPI:  Janine Reller is a 60 y.o. female with medical history significant for schizophrenia, HIV, COPD.  Patient was brought from nursing home with reports of unresponsiveness, with possible overdose.  She was given some Narcan prior to arrival as well as in the ED with some improvement in responsiveness.  She has been admitted with acute metabolic encephalopathy likely multifactorial in the setting of pneumonia, azotemia/AKI, and elevated liver enzymes.  GI consulted to assist with evaluation of liver injury.   Assessment / Plan /  Recommendation Clinical Impression  Clinical swallowing evaluation completed while Pt was sitting upright in bed; Pt consumed regular textures, puree textures and thin liquids without overt s/sx of aspiration. Pt is edentulous and required additional time to completely masticate regular trials and thin liquid wash facilitated clearing oral residue. Recommend continue with D3/mech soft diet and thin liquids. SLP reviewed universal aspiration precautions with the Pt. There are no further ST needs noted at this time, ST will sign off. Thank you for this referral, SLP Visit Diagnosis: Dysphagia, unspecified (R13.10)    Aspiration Risk  No limitations    Diet Recommendation Thin liquid;Dysphagia 3 (Mech soft)   Liquid Administration via: Cup;Straw Medication Administration: Whole meds with liquid Supervision: Patient able to self feed;Intermittent supervision to cue for compensatory strategies Compensations: Minimize environmental distractions Postural Changes: Seated upright at 90 degrees    Other  Recommendations Oral Care Recommendations: Oral care BID   Follow up Recommendations None        Swallow Study   General Date of Onset: 05/10/21 HPI: Shoshanna Mcquitty is a 60 y.o. female with medical history significant for schizophrenia, HIV, COPD.  Patient was brought from nursing home with reports of unresponsiveness, with possible overdose.  She was given some Narcan prior to arrival as well as in the ED with some improvement in responsiveness.  She has been admitted with acute metabolic encephalopathy likely multifactorial in the setting of pneumonia, azotemia/AKI, and elevated liver enzymes.  GI consulted to assist with evaluation of liver  injury. Type of Study: Bedside Swallow Evaluation Previous Swallow Assessment: none in chart Diet Prior to this Study: Dysphagia 3 (soft);Thin liquids Temperature Spikes Noted: No Respiratory Status: Room air History of Recent Intubation:  No Behavior/Cognition: Alert;Cooperative;Pleasant mood Oral Cavity Assessment: Within Functional Limits Oral Care Completed by SLP: Recent completion by staff Oral Cavity - Dentition: Edentulous Vision: Functional for self-feeding Self-Feeding Abilities: Able to feed self;Needs assist;Needs set up Patient Positioning: Upright in bed Baseline Vocal Quality: Normal Volitional Cough: Strong Volitional Swallow: Able to elicit    Oral/Motor/Sensory Function Overall Oral Motor/Sensory Function: Within functional limits   Ice Chips Ice chips: Within functional limits   Thin Liquid Thin Liquid: Within functional limits    Nectar Thick Nectar Thick Liquid: Not tested   Honey Thick Honey Thick Liquid: Not tested   Puree Puree: Within functional limits   Solid     Solid: Within functional limits     Karlissa Aron H. Roddie Mc, CCC-SLP Speech Language Pathologist  Wende Bushy 05/14/2021,11:44 AM

## 2021-05-14 NOTE — Progress Notes (Signed)
PROGRESS NOTE    Karina Clarke  NKN:[id number] DOB: 1961/03/14 DOA: 05/10/2021 PCP: Rosita Fire, MD   Brief Narrative:   Karina Clarke is a 60 y.o. female with medical history significant for schizophrenia, HIV, COPD.  Patient was brought from nursing home with reports of unresponsiveness, with possible overdose.  She was given some Narcan prior to arrival as well as in the ED with some improvement in responsiveness.  She has been admitted with acute metabolic encephalopathy likely multifactorial in the setting of pneumonia, azotemia/AKI, and elevated liver enzymes.  GI consulted to assist with evaluation of liver injury with labs showing signs of improvement.  Assessment & Plan:   Principal Problem:   AMS (altered mental status) Active Problems:   HIV (human immunodeficiency virus infection) ([place])   Schizophrenia ([place])   Diabetes mellitus, new onset ([place])   AKI (acute kidney injury) ([place])   PNA (pneumonia)   Transaminitis   Acute metabolic encephalopathy-multifactorial; appears to be back to baseline -Likely related to metabolic derangements that are also showing signs of improvement -Currently on full liquid diet, will be on dysphagia 3 -Head CT without acute findings -Continue to treat AKI as well as sepsis -UDS only positive for benzodiazepines with Xanax prescription outpatient -PT evaluation   AKI-improving -Baseline 1-1.3 -Continue on half-normal saline due to hypernatremia -Recheck a.m. labs -Monitor urine output   Severe sepsis secondary to pneumonia and also possible UTI -Continue IV Unasyn and discontinue further azithromycin -Urine cultures no growth -Blood cultures negative   Transaminitis-improving -Possibly due to drug-induced liver injury versus ischemia -Appreciate ongoing GI evaluation -Continue to follow a.m. labs   Hypernatremia-improving -Changed IV fluid to LR, follow in a.m.   Elevated troponin -2D echocardiogram 60-65% EF with grade 1  diastolic dysfunction   Thrombocytopenia-stable -Likely related to sepsis -Continue to monitor trend -Avoid heparin agents   HIV -HIV viral load not detected 12/2020 with stable CD4 count -Hold medications for now   Schizophrenia -Resume Haldol, Depakote, and Seroquel -Xanax may be resumed upon discharge   Hypertension-stable   Type 2 diabetes-with hypoglycemia -Hemoglobin A1c 6.0% -Currently on D5 IV fluid   DVT prophylaxis:SCDs Code Status: Full Family Communication: None at bedside, called sister 6/17, tried calling 6/20 with no response Disposition Plan:  Status is: Inpatient   Remains inpatient appropriate because:Altered mental status, IV treatments appropriate due to intensity of illness or inability to take PO, and Inpatient level of care appropriate due to severity of illness   Dispo: The patient is from: Group home              Anticipated d/c is to: Group home vs SNF              Patient currently is not medically stable to d/c.              Difficult to place patient No   Consultants:  GI   Procedures:  See below  Antimicrobials:  Anti-infectives (From admission, onward)    Start     Dose/Rate Route Frequency Ordered Stop   05/12/21 1834  Ampicillin-Sulbactam (UNASYN) 3 g in sodium chloride 0.9 % 100 mL IVPB        3 g 200 mL/hr over 30 Minutes Intravenous Every 8 hours 05/12/21 1409     05/11/21 1200  Ampicillin-Sulbactam (UNASYN) 3 g in sodium chloride 0.9 % 100 mL IVPB  Status:  Discontinued        3 g 200 mL/hr over 30 Minutes Intravenous Every  12 hours 05/11/21 1159 05/12/21 1409   05/10/21 1915  Ampicillin-Sulbactam (UNASYN) 3 g in sodium chloride 0.9 % 100 mL IVPB  Status:  Discontinued        3 g 200 mL/hr over 30 Minutes Intravenous Every 24 hours 05/10/21 1900 05/11/21 1159   05/10/21 1430  cefTRIAXone (ROCEPHIN) 2 g in sodium chloride 0.9 % 100 mL IVPB  Status:  Discontinued        2 g 200 mL/hr over 30 Minutes Intravenous Every 24 hours  05/10/21 1417 05/10/21 1849   05/10/21 1430  azithromycin (ZITHROMAX) 500 mg in sodium chloride 0.9 % 250 mL IVPB  Status:  Discontinued        500 mg 250 mL/hr over 60 Minutes Intravenous Every 24 hours 05/10/21 1417 05/14/21 1326       Subjective: Patient seen and evaluated today with no new acute complaints or concerns. No acute concerns or events noted overnight.  Objective: Vitals:   05/14/21 0800 05/14/21 0900 05/14/21 1000 05/14/21 1100  BP: 130/60 109/63 114/64 (!) 110/54  Pulse: 92 95 81 90  Resp: 20 (!) 24 19 12   Temp:    98.8 F (37.1 C)  TempSrc:    Axillary  SpO2: 94% 93% 94% 90%  Weight:      Height:        Intake/Output Summary (Last 24 hours) at 05/14/2021 1331 Last data filed at 05/14/2021 1301 Gross per 24 hour  Intake 2790.21 ml  Output 601 ml  Net 2189.21 ml   Filed Weights   05/10/21 1230 05/11/21 1237 05/13/21 0500  Weight: 99.8 kg 93.9 kg 94.4 kg    Examination:  General exam: Appears calm and comfortable, more conversant Respiratory system: Clear to auscultation. Respiratory effort normal.  On nasal cannula Cardiovascular system: S1 & S2 heard, RRR.  Gastrointestinal system: Abdomen is soft Central nervous system: Alert and awake, answers certain questions appropriately Extremities: No edema Skin: No significant lesions noted Psychiatry: Flat affect.    Data Reviewed: I have personally reviewed following labs and imaging studies  CBC: Recent Labs  Lab 05/10/21 1231 05/11/21 0418 05/12/21 0352 05/13/21 0436 05/14/21 0456  WBC 5.4 6.7 6.1 7.4 7.2  NEUTROABS 3.0  --   --   --   --   HGB 14.9 12.3 12.5 11.7* 11.3*  HCT 45.8 38.5 40.0 37.9 36.0  MCV 97.9 100.5* 101.8* 102.2* 102.6*  PLT 90* 80* 75* 87* 621*   Basic Metabolic Panel: Recent Labs  Lab 05/10/21 2254 05/11/21 0418 05/12/21 0352 05/13/21 0436 05/14/21 0456  NA 139 141 147* 149* 146*  K 4.7 4.8 5.0 4.6 4.7  CL 108 111 119* 119* 116*  CO2 23 20* 22 24 26   GLUCOSE  99 84 100* 132* 103*  BUN 74* 70* 49* 29* 18  CREATININE 4.01* 3.36* 1.80* 1.04* 0.83  CALCIUM 7.6* 7.7* 8.0* 8.4* 8.3*  MG  --   --  2.5* 2.0 1.5*   GFR: Estimated Creatinine Clearance: 89.8 mL/min (by C-G formula based on SCr of 0.83 mg/dL). Liver Function Tests: Recent Labs  Lab 05/10/21 2254 05/11/21 0418 05/12/21 0352 05/13/21 0436 05/14/21 0456  AST 776* 729* 522* 386* 253*  ALT 242* 224* 181* 160* 135*  ALKPHOS 75 67 60 54 51  BILITOT 0.1* 0.3 0.2* 0.2* 0.3  PROT 6.5 6.1* 5.7* 5.5* 5.4*  ALBUMIN 3.0* 2.8* 2.6* 2.4* 2.3*   No results for input(s): LIPASE, AMYLASE in the last 168 hours. Recent Labs  Lab  05/10/21 1424 05/11/21 1154  AMMONIA 20 30   Coagulation Profile: Recent Labs  Lab 05/10/21 1424 05/10/21 2254 05/11/21 1154  INR 1.0 1.0 1.1   Cardiac Enzymes: No results for input(s): CKTOTAL, CKMB, CKMBINDEX, TROPONINI in the last 168 hours. BNP (last 3 results) No results for input(s): PROBNP in the last 8760 hours. HbA1C: No results for input(s): HGBA1C in the last 72 hours. CBG: Recent Labs  Lab 05/13/21 1108 05/13/21 1842 05/13/21 2331 05/14/21 0645 05/14/21 1140  GLUCAP 137* 143* 105* 89 209*   Lipid Profile: No results for input(s): CHOL, HDL, LDLCALC, TRIG, CHOLHDL, LDLDIRECT in the last 72 hours. Thyroid Function Tests: No results for input(s): TSH, T4TOTAL, FREET4, T3FREE, THYROIDAB in the last 72 hours. Anemia Panel: No results for input(s): VITAMINB12, FOLATE, FERRITIN, TIBC, IRON, RETICCTPCT in the last 72 hours. Sepsis Labs: Recent Labs  Lab 05/10/21 1231 05/10/21 1424 05/11/21 1151  PROCALCITON  --   --  7.79  LATICACIDVEN 2.0* 1.7  --     Recent Results (from the past 240 hour(s))  Blood culture (routine single)     Status: None (Preliminary result)   Collection Time: 05/10/21 12:31 PM   Specimen: BLOOD  Result Value Ref Range Status   Specimen Description BLOOD LEFT ANTECUBITAL  Final   Special Requests   Final     BOTTLES DRAWN AEROBIC AND ANAEROBIC Blood Culture adequate volume   Culture   Final    NO GROWTH 4 DAYS Performed at [facility], [address] [place], [place] [address]    Report Status PENDING  Incomplete  Urine culture     Status: None   Collection Time: 05/10/21 12:31 PM   Specimen: In/Out Cath Urine  Result Value Ref Range Status   Specimen Description   Final    IN/OUT CATH URINE Performed at [facility], [address] [place], [place] [address]    Special Requests   Final    NONE Performed at [facility], [address], [place], [place] [address]    Culture   Final    NO GROWTH Performed at [facility], [place] [address], [place],  [address]    Report Status 05/12/2021 FINAL  Final  Resp Panel by RT-PCR (Flu A&B, Covid) Nasopharyngeal Swab     Status: None   Collection Time: 05/10/21  2:58 PM   Specimen: Nasopharyngeal Swab; Nasopharyngeal(NP) swabs in vial transport medium  Result Value Ref Range Status   SARS Coronavirus 2 by RT PCR NEGATIVE NEGATIVE Final    Comment: (NOTE) SARS-CoV-2 target nucleic acids are NOT DETECTED.  The SARS-CoV-2 RNA is generally detectable in upper respiratory specimens during the acute phase of infection. The lowest concentration of SARS-CoV-2 viral copies this assay can detect is 138 copies/mL. A negative result does not preclude SARS-Cov-2 infection and should not be used as the sole basis for treatment or other patient management decisions. A negative result may occur with  improper specimen collection/handling, submission of specimen other than nasopharyngeal swab, presence of viral mutation(s) within the areas targeted by this assay, and inadequate number of viral copies(<138 copies/mL). A negative result must be combined with clinical observations, patient history, and epidemiological information. The expected result is Negative.  Fact Sheet for Patients:  EntrepreneurPulse.com.au  Fact Sheet  for Healthcare Providers:  IncredibleEmployment.be  This test is no t yet approved or cleared by the [place] FDA and  has been authorized for detection and/or diagnosis of SARS-CoV-2 by FDA under  an Emergency Use Authorization (EUA). This EUA will remain  in effect (meaning this test can be used) for the duration of the COVID-19 declaration under Section 564(b)(1) of the Act, 21 U.S.C.section 360bbb-3(b)(1), unless the authorization is terminated  or revoked sooner.       Influenza A by PCR NEGATIVE NEGATIVE Final   Influenza B by PCR NEGATIVE NEGATIVE Final    Comment: (NOTE) The Xpert Xpress SARS-CoV-2/FLU/RSV plus assay is intended as an aid in the diagnosis of influenza from Nasopharyngeal swab specimens and should not be used as a sole basis for treatment. Nasal washings and aspirates are unacceptable for Xpert Xpress SARS-CoV-2/FLU/RSV testing.  Fact Sheet for Patients: EntrepreneurPulse.com.au  Fact Sheet for Healthcare Providers: IncredibleEmployment.be  This test is not yet approved or cleared by the [place] FDA and has been authorized for detection and/or diagnosis of SARS-CoV-2 by FDA under an Emergency Use Authorization (EUA). This EUA will remain in effect (meaning this test can be used) for the duration of the COVID-19 declaration under Section 564(b)(1) of the Act, 21 U.S.C. section 360bbb-3(b)(1), unless the authorization is terminated or revoked.  Performed at [facility], [address] [place], [place] [address]   MRSA Next Gen by PCR, Nasal     Status: None   Collection Time: 05/11/21 12:45 PM   Specimen: Nasal Mucosa; Nasal Swab  Result Value Ref Range Status   MRSA by PCR Next Gen NOT DETECTED NOT DETECTED Final    Comment: (NOTE) The GeneXpert MRSA Assay (FDA approved for NASAL specimens only), is one component of a comprehensive MRSA colonization surveillance program. It is not  intended to diagnose MRSA infection nor to guide or monitor treatment for MRSA infections. Test performance is not FDA approved in patients less than 40 years old. Performed at [facility], [address], [place], [place] [address]          Radiology Studies: No results found.      Scheduled Meds:  Chlorhexidine Gluconate Cloth  6 each Topical Daily   divalproex  500 mg Oral BID   haloperidol  20 mg Oral TID   insulin aspart  0-9 Units Subcutaneous Q6H   metoprolol tartrate  25 mg Oral BID   QUEtiapine  200 mg Oral BID   Continuous Infusions:  sodium chloride Stopped (05/14/21 1300)   ampicillin-sulbactam (UNASYN) IV Stopped (05/14/21 1158)     LOS: 4 days    Time spent: 35 minutes    Rhyanna Sorce Darleen Crocker, DO [facility]  If 7PM-7AM, please contact night-coverage [email or web address] 05/14/2021, 1:31 PM

## 2021-05-15 ENCOUNTER — Other Ambulatory Visit: Payer: Self-pay

## 2021-05-15 ENCOUNTER — Inpatient Hospital Stay (HOSPITAL_COMMUNITY)
Admission: EM | Admit: 2021-05-15 | Discharge: 2021-05-22 | Disposition: A | Discharge status: Skilled Nursing Facility | Payer: Medicaid Other | Source: Home / Self Care | Attending: Family Medicine | Admitting: Family Medicine

## 2021-05-15 ENCOUNTER — Encounter (HOSPITAL_COMMUNITY): Payer: Self-pay

## 2021-05-15 ENCOUNTER — Emergency Department (HOSPITAL_COMMUNITY): Payer: Medicaid Other

## 2021-05-15 DIAGNOSIS — G928 Other toxic encephalopathy: Secondary | ICD-10-CM | POA: Diagnosis present

## 2021-05-15 DIAGNOSIS — R1310 Dysphagia, unspecified: Secondary | ICD-10-CM | POA: Diagnosis present

## 2021-05-15 DIAGNOSIS — F70 Mild intellectual disabilities: Secondary | ICD-10-CM | POA: Diagnosis present

## 2021-05-15 DIAGNOSIS — R6520 Severe sepsis without septic shock: Secondary | ICD-10-CM | POA: Diagnosis present

## 2021-05-15 DIAGNOSIS — I10 Essential (primary) hypertension: Secondary | ICD-10-CM

## 2021-05-15 DIAGNOSIS — F200 Paranoid schizophrenia: Secondary | ICD-10-CM

## 2021-05-15 DIAGNOSIS — Y95 Nosocomial condition: Secondary | ICD-10-CM

## 2021-05-15 DIAGNOSIS — E870 Hyperosmolality and hypernatremia: Secondary | ICD-10-CM | POA: Diagnosis present

## 2021-05-15 DIAGNOSIS — E11649 Type 2 diabetes mellitus with hypoglycemia without coma: Secondary | ICD-10-CM | POA: Diagnosis present

## 2021-05-15 DIAGNOSIS — K319 Disease of stomach and duodenum, unspecified: Secondary | ICD-10-CM | POA: Diagnosis present

## 2021-05-15 DIAGNOSIS — Z794 Long term (current) use of insulin: Secondary | ICD-10-CM

## 2021-05-15 DIAGNOSIS — R52 Pain, unspecified: Secondary | ICD-10-CM

## 2021-05-15 DIAGNOSIS — E119 Type 2 diabetes mellitus without complications: Secondary | ICD-10-CM

## 2021-05-15 DIAGNOSIS — Z21 Asymptomatic human immunodeficiency virus [HIV] infection status: Secondary | ICD-10-CM | POA: Diagnosis present

## 2021-05-15 DIAGNOSIS — D6959 Other secondary thrombocytopenia: Secondary | ICD-10-CM | POA: Diagnosis present

## 2021-05-15 DIAGNOSIS — F17210 Nicotine dependence, cigarettes, uncomplicated: Secondary | ICD-10-CM | POA: Diagnosis present

## 2021-05-15 DIAGNOSIS — J9601 Acute respiratory failure with hypoxia: Secondary | ICD-10-CM

## 2021-05-15 DIAGNOSIS — J189 Pneumonia, unspecified organism: Secondary | ICD-10-CM | POA: Diagnosis present

## 2021-05-15 DIAGNOSIS — Z833 Family history of diabetes mellitus: Secondary | ICD-10-CM

## 2021-05-15 DIAGNOSIS — A419 Sepsis, unspecified organism: Secondary | ICD-10-CM | POA: Diagnosis present

## 2021-05-15 DIAGNOSIS — Z20822 Contact with and (suspected) exposure to covid-19: Secondary | ICD-10-CM | POA: Diagnosis present

## 2021-05-15 DIAGNOSIS — E8809 Other disorders of plasma-protein metabolism, not elsewhere classified: Secondary | ICD-10-CM | POA: Diagnosis present

## 2021-05-15 DIAGNOSIS — R0902 Hypoxemia: Secondary | ICD-10-CM

## 2021-05-15 DIAGNOSIS — N179 Acute kidney failure, unspecified: Secondary | ICD-10-CM | POA: Diagnosis present

## 2021-05-15 DIAGNOSIS — B20 Human immunodeficiency virus [HIV] disease: Secondary | ICD-10-CM | POA: Diagnosis present

## 2021-05-15 DIAGNOSIS — R7401 Elevation of levels of liver transaminase levels: Secondary | ICD-10-CM | POA: Diagnosis present

## 2021-05-15 DIAGNOSIS — Z886 Allergy status to analgesic agent status: Secondary | ICD-10-CM

## 2021-05-15 DIAGNOSIS — N390 Urinary tract infection, site not specified: Secondary | ICD-10-CM | POA: Diagnosis present

## 2021-05-15 DIAGNOSIS — J440 Chronic obstructive pulmonary disease with acute lower respiratory infection: Secondary | ICD-10-CM | POA: Diagnosis present

## 2021-05-15 DIAGNOSIS — E7800 Pure hypercholesterolemia, unspecified: Secondary | ICD-10-CM | POA: Diagnosis present

## 2021-05-15 DIAGNOSIS — R4182 Altered mental status, unspecified: Secondary | ICD-10-CM

## 2021-05-15 DIAGNOSIS — Z79899 Other long term (current) drug therapy: Secondary | ICD-10-CM

## 2021-05-15 LAB — COMPREHENSIVE METABOLIC PANEL
ALT: 114 U/L — ABNORMAL HIGH (ref 0–44)
ALT: 112 U/L — ABNORMAL HIGH (ref 0–44)
AST: 176 U/L — ABNORMAL HIGH (ref 15–41)
AST: 163 U/L — ABNORMAL HIGH (ref 15–41)
Albumin: 2.2 g/dL — ABNORMAL LOW (ref 3.5–5.0)
Albumin: 2.3 g/dL — ABNORMAL LOW (ref 3.5–5.0)
Alkaline Phosphatase: 51 U/L (ref 38–126)
Alkaline Phosphatase: 55 U/L (ref 38–126)
Anion gap: 5 (ref 5–15)
Anion gap: 5 (ref 5–15)
BUN: 15 mg/dL (ref 6–20)
BUN: 16 mg/dL (ref 6–20)
CO2: 27 mmol/L (ref 22–32)
CO2: 27 mmol/L (ref 22–32)
Calcium: 8.2 mg/dL — ABNORMAL LOW (ref 8.9–10.3)
Calcium: 8.2 mg/dL — ABNORMAL LOW (ref 8.9–10.3)
Chloride: 110 mmol/L (ref 98–111)
Chloride: 109 mmol/L (ref 98–111)
Creatinine, Ser: 0.83 mg/dL (ref 0.44–1.00)
Creatinine, Ser: 0.78 mg/dL (ref 0.44–1.00)
GFR, Estimated: >60 mL/min (ref >60)
GFR, Estimated: >60 mL/min (ref >60)
Glucose, Bld: 80 mg/dL (ref 70–99)
Glucose, Bld: 98 mg/dL (ref 70–99)
Potassium: 4.2 mmol/L (ref 3.5–5.1)
Potassium: 3.9 mmol/L (ref 3.5–5.1)
Sodium: 142 mmol/L (ref 135–145)
Sodium: 141 mmol/L (ref 135–145)
Total Bilirubin: 0.4 mg/dL (ref 0.3–1.2)
Total Bilirubin: 0.4 mg/dL (ref 0.3–1.2)
Total Protein: 5.3 g/dL — ABNORMAL LOW (ref 6.5–8.1)
Total Protein: 5.7 g/dL — ABNORMAL LOW (ref 6.5–8.1)

## 2021-05-15 LAB — CBC WITH DIFFERENTIAL/PLATELET
Abs Immature Granulocytes: 0.1 10*3/uL — ABNORMAL HIGH (ref 0.00–0.07)
Basophils Absolute: 0 10*3/uL (ref 0.0–0.1)
Basophils Relative: 0 %
Eosinophils Absolute: 0.1 10*3/uL (ref 0.0–0.5)
Eosinophils Relative: 1 %
HCT: 34.3 % — ABNORMAL LOW (ref 36.0–46.0)
Hemoglobin: 11 g/dL — ABNORMAL LOW (ref 12.0–15.0)
Immature Granulocytes: 1 %
Lymphocytes Relative: 41 %
Lymphs Abs: 3 10*3/uL (ref 0.7–4.0)
MCH: 32.2 pg (ref 26.0–34.0)
MCHC: 32.1 g/dL (ref 30.0–36.0)
MCV: 100.3 fL — ABNORMAL HIGH (ref 80.0–100.0)
Monocytes Absolute: 0.8 10*3/uL (ref 0.1–1.0)
Monocytes Relative: 11 %
Neutro Abs: 3.2 10*3/uL (ref 1.7–7.7)
Neutrophils Relative %: 46 %
Platelets: 166 10*3/uL (ref 150–400)
RBC: 3.42 MIL/uL — ABNORMAL LOW (ref 3.87–5.11)
RDW: 16.1 % — ABNORMAL HIGH (ref 11.5–15.5)
WBC: 7.2 10*3/uL (ref 4.0–10.5)
nRBC: 0.3 % — ABNORMAL HIGH (ref 0.0–0.2)

## 2021-05-15 LAB — CBC
HCT: 34.5 % — ABNORMAL LOW (ref 36.0–46.0)
Hemoglobin: 10.6 g/dL — ABNORMAL LOW (ref 12.0–15.0)
MCH: 31.5 pg (ref 26.0–34.0)
MCHC: 30.7 g/dL (ref 30.0–36.0)
MCV: 102.7 fL — ABNORMAL HIGH (ref 80.0–100.0)
Platelets: 143 10*3/uL — ABNORMAL LOW (ref 150–400)
RBC: 3.36 MIL/uL — ABNORMAL LOW (ref 3.87–5.11)
RDW: 16.7 % — ABNORMAL HIGH (ref 11.5–15.5)
WBC: 7.3 10*3/uL (ref 4.0–10.5)
nRBC: 0.3 % — ABNORMAL HIGH (ref 0.0–0.2)

## 2021-05-15 LAB — CULTURE, BLOOD (SINGLE)
Culture: NO GROWTH
Special Requests: ADEQUATE

## 2021-05-15 LAB — GLUCOSE, CAPILLARY
Glucose-Capillary: 81 mg/dL (ref 70–99)
Glucose-Capillary: 145 mg/dL — ABNORMAL HIGH (ref 70–99)

## 2021-05-15 LAB — CBG MONITORING, ED: Glucose-Capillary: 105 mg/dL — ABNORMAL HIGH (ref 70–99)

## 2021-05-15 LAB — MAGNESIUM: Magnesium: 1.6 mg/dL — ABNORMAL LOW (ref 1.7–2.4)

## 2021-05-15 MED ORDER — AMOXICILLIN-POT CLAVULANATE 875-125 MG PO TABS: 1.0000 | ORAL_TABLET | Freq: Two times a day (BID) | ORAL | 0 refills | Status: DC

## 2021-05-15 MED ORDER — SODIUM CHLORIDE 0.9 % IV SOLN
1.0000 g | INTRAVENOUS | Status: DC
  Administered 2021-05-15 – 2021-05-16 (×2): 1 g via INTRAVENOUS
  Filled 2021-05-15 (×2): qty 10

## 2021-05-15 MED ORDER — MAGNESIUM SULFATE 2 GM/50ML IV SOLN
2.0000 g | Freq: Once | INTRAVENOUS | Status: AC
  Administered 2021-05-15: 2 g via INTRAVENOUS
  Filled 2021-05-15: qty 50

## 2021-05-15 MED ORDER — SODIUM CHLORIDE 0.9 % IV SOLN
500.0000 mg | INTRAVENOUS | Status: DC
  Administered 2021-05-15 – 2021-05-16 (×2): 500 mg via INTRAVENOUS
  Filled 2021-05-15 (×2): qty 500

## 2021-05-15 MED ORDER — ENOXAPARIN SODIUM 40 MG/0.4ML IJ SOSY
40.0000 mg | PREFILLED_SYRINGE | INTRAMUSCULAR | Status: DC
  Administered 2021-05-15 – 2021-05-17 (×3): 40 mg via SUBCUTANEOUS
  Filled 2021-05-15 (×3): qty 0.4

## 2021-05-15 NOTE — ED Notes (Signed)
Pt soiled bed, pt changed, linens changed and peri-care performed. Pt placed on pure wick.

## 2021-05-15 NOTE — Discharge Summary (Signed)
Physician Discharge Summary  Karina Clarke CVE:[id number] DOB: 12/10/1960 DOA: 05/10/2021  PCP: Rosita Fire, MD  Admit date: 05/10/2021  Discharge date: 05/15/2021  Admitted From:[facility]  Disposition:  Same  Recommendations for Outpatient Follow-up:  Follow up with PCP in 1-2 weeks Follow-up with [facility] gastroenterology which will be scheduled to review LFTs and further liver studies which are still pending Hold statin, Ambien, Vistaril, and Xanax on discharge Continue Augmentin for 3 more days to cover aspiration pneumonia  Home Health: Yes with PT  Equipment/Devices: None  Discharge Condition:Stable  CODE STATUS: Full  Diet recommendation: Dysphagia 3  Brief/Interim Summary: Karina Clarke is a 60 y.o. female with medical history significant for schizophrenia, HIV, COPD.  Patient was brought from nursing home with reports of unresponsiveness, with possible overdose.  She was given some Narcan prior to arrival as well as in the ED with some improvement in responsiveness.  She has been admitted with acute metabolic encephalopathy likely multifactorial in the setting of pneumonia, azotemia/AKI, and elevated liver enzymes.  She is also thought to have polypharmacy as she is on multiple sedating medications.  GI was consulted to evaluate her liver injury and it was thought that much of this was related to ischemia/sepsis as her LFT trend had come down spontaneously with treatment of her pneumonia and hydration with fluid.  Multiple lab tests have been ordered and were within normal limits with certain labs still pending and there will be follow-up arranged for this.  She is otherwise stable and appears to be at baseline.  Her AKI has also resolved and she appears to be doing well from a pneumonia standpoint.  At baseline she is not very conversational and this appears to be related to her schizophrenia.  She was on multiple sedating medications for treatment and I have  recommended withholding some of them as noted above since I believe they were contributing to her altered mental status.  She has done well without them during the course of this admission.  Discharge Diagnoses:  Principal Problem:   AMS (altered mental status) Active Problems:   HIV (human immunodeficiency virus infection) ([place])   Schizophrenia ([place])   Diabetes mellitus, new onset ([employer])   AKI (acute kidney injury) ([place])   PNA (pneumonia)   Transaminitis  Principal discharge diagnosis: Acute metabolic encephalopathy-multifactorial in the setting of sepsis along with polypharmacy.  Transaminitis related to sepsis/ischemia along with AKI.  Discharge Instructions  Discharge Instructions     Diet - low sodium heart healthy   Complete by: As directed    Increase activity slowly   Complete by: As directed       Allergies as of 05/15/2021       Reactions   Asa [aspirin] Nausea And Vomiting        Medication List     STOP taking these medications    ALPRAZolam 0.5 MG tablet Commonly known as: XANAX   atorvastatin 10 MG tablet Commonly known as: LIPITOR   HYDROcodone-acetaminophen 5-325 MG tablet Commonly known as: NORCO/VICODIN   hydrOXYzine 25 MG capsule Commonly known as: VISTARIL   ibuprofen 400 MG tablet Commonly known as: ADVIL   zolpidem 10 MG tablet Commonly known as: AMBIEN       TAKE these medications    abacavir-lamiVUDine 600-300 MG tablet Commonly known as: EPZICOM Take 1 tablet by mouth daily.   acetaminophen 650 MG CR tablet Commonly known as: TYLENOL Take 650 mg by mouth every 8 (eight) hours as needed for  pain.   albuterol 108 (90 Base) MCG/ACT inhaler Commonly known as: VENTOLIN HFA Inhale 1-2 puffs into the lungs 2 (two) times daily as needed for wheezing or shortness of breath.   amoxicillin-clavulanate 875-125 MG tablet Commonly known as: Augmentin Take 1 tablet by mouth 2 (two) times daily for 3 days.   atazanavir 200 MG  capsule Commonly known as: REYATAZ Take 400 mg by mouth daily.   divalproex 500 MG DR tablet Commonly known as: DEPAKOTE Take 500 mg by mouth 2 (two) times daily. Take 1 tablet (500 mg) by mouth at 4pm and 8pm daily   haloperidol 10 MG tablet Commonly known as: HALDOL Take 20 mg by mouth 3 (three) times daily.   haloperidol decanoate 100 MG/ML injection Commonly known as: HALDOL DECANOATE Inject 200 mg into the muscle every 28 (twenty-eight) days. Last injection 06/09/15   insulin aspart 100 UNIT/ML injection Commonly known as: novoLOG Inject 0-20 Units into the skin 3 (three) times daily with meals.   insulin detemir 100 UNIT/ML injection Commonly known as: LEVEMIR Inject 0.33 mLs (33 Units total) into the skin at bedtime.   Magnesium Oxide 400 MG Caps Take 1 capsule (400 mg total) by mouth every morning.   metoprolol tartrate 25 MG tablet Commonly known as: LOPRESSOR Take 1 tablet by mouth 2 (two) times daily.   polyethylene glycol 17 g packet Commonly known as: MIRALAX / GLYCOLAX Take 17 g by mouth daily as needed for mild constipation.   QUEtiapine 200 MG tablet Commonly known as: SEROQUEL Take 200 mg by mouth 2 (two) times daily. Take 1 tablet (200 mg) by mouth with supper and at bedtime daily   raltegravir 400 MG tablet Commonly known as: ISENTRESS Take 400 mg by mouth 2 (two) times daily.   trihexyphenidyl 2 MG tablet Commonly known as: ARTANE Take 2 mg by mouth 2 (two) times daily with a meal.   valACYclovir 1000 MG tablet Commonly known as: VALTREX Take 1,000 mg by mouth daily.        Follow-up Information     Rosita Fire, MD. Schedule an appointment as soon as possible for a visit in 1 week(s).   Specialty: Internal Medicine Contact information: [place] [address] [phone]         [place]. Go to.   Contact information: [place]  [address] [phone]               Allergies  Allergen Reactions   Diona Fanti [Aspirin] Nausea And Vomiting    Consultations: Gastroenterology   Procedures/Studies: CT ABDOMEN PELVIS WO CONTRAST  Result Date: 05/10/2021 CLINICAL DATA:  Lethargy. Abdominal pain. Elevated liver enzymes. Acute kidney injury. Altered mental status. History of umbilical hernia. EXAM: CT ABDOMEN AND PELVIS WITHOUT CONTRAST TECHNIQUE: Multidetector CT imaging of the abdomen and pelvis was performed following the standard protocol without IV contrast. COMPARISON:  None. FINDINGS: Lower chest: Hypoventilatory changes at the dependent lung bases. Coronary atherosclerosis. Hepatobiliary: Normal liver size. No liver mass. Normal gallbladder with no radiopaque cholelithiasis. No biliary ductal dilatation. Pancreas: Normal, with no mass or duct dilation. Spleen: Normal size. No mass. Adrenals/Urinary Tract: Normal adrenals. No renal stones. No hydronephrosis. No contour deforming renal masses. Bladder collapsed by indwelling Foley catheter and grossly normal. Stomach/Bowel: Normal non-distended stomach. Normal caliber small bowel with no small bowel wall thickening. Normal appendix. Normal large bowel with no diverticulosis, large bowel wall thickening or pericolonic fat stranding. Vascular/Lymphatic: Atherosclerotic nonaneurysmal abdominal  aorta. No pathologically enlarged lymph nodes in the abdomen or pelvis. Reproductive: Grossly normal uterus.  No adnexal mass. Other: No pneumoperitoneum. Trace free fluid in the pelvic cul-de-sac. No focal fluid collections. No evidence of a ventral abdominal hernia. Musculoskeletal: No aggressive appearing focal osseous lesions. IMPRESSION: 1. No acute abnormality. No hydronephrosis. No evidence of bowel obstruction or acute bowel inflammation. Normal appendix. 2. Trace free fluid in the pelvic cul-de-sac, nonspecific. No adnexal masses. 3. Coronary atherosclerosis. 4. Aortic Atherosclerosis  (ICD10-I70.0). Electronically Signed   By: Ilona Sorrel M.D.   On: 05/10/2021 18:39   CT Head Wo Contrast  Result Date: 05/10/2021 CLINICAL DATA:  Unresponsive prior to arrival. Question intracranial hemorrhage. EXAM: CT HEAD WITHOUT CONTRAST TECHNIQUE: Contiguous axial images were obtained from the base of the skull through the vertex without intravenous contrast. COMPARISON:  None. FINDINGS: Brain: Brainstem appears normal. No focal cerebellar finding. Cerebral hemispheres show moderate chronic small-vessel ischemic changes of the white matter. No sign of acute infarction, mass lesion, hemorrhage, hydrocephalus or extra-axial collection. Vascular: There is atherosclerotic calcification of the major vessels at the base of the brain. Skull: Negative Sinuses/Orbits: Clear/normal Other: None IMPRESSION: No acute finding by CT. Moderate chronic small-vessel ischemic changes of the cerebral hemispheric white matter. Electronically Signed   By: Nelson Chimes M.D.   On: 05/10/2021 13:56   DG Chest Port 1 View  Result Date: 05/10/2021 CLINICAL DATA:  Unresponsive with concern for drug overdose EXAM: PORTABLE CHEST 1 VIEW COMPARISON:  January 27, 2018 chest radiograph. Chest CT October 17, 2017 FINDINGS: There are areas of ill-defined airspace opacity in right mid and lower lung regions. Lungs elsewhere are clear. Heart is upper normal in size, stable, with prominence of central pulmonary arteries, consistent with pulmonary arterial hypertension. No adenopathy appreciable. No bone lesions IMPRESSION: Areas of ill-defined opacity right mid and lower lung regions. Suspect early pneumonia or possible foci of aspiration. Lungs elsewhere clear. Heart upper normal in size with evidence of pulmonary arterial hypertension. Note that pulmonary arterial hypertension was present on prior CT examination. Electronically Signed   By: Lowella Grip III M.D.   On: 05/10/2021 13:23   ECHOCARDIOGRAM COMPLETE  Result Date:  05/11/2021    ECHOCARDIOGRAM REPORT   Patient Name:   VENNIE SALSBURY Date of Exam: 05/11/2021 Medical Rec #:  [id number]      Height:       68.0 in Accession #:    [id number]     Weight:       207.0 lb Date of Birth:  [birth year]      BSA:          2.074 m Patient Age:    60 years       BP:           110/69 mmHg Patient Gender: F              HR:           100 bpm. Exam Location:  [facility] Procedure: 2D Echo, Cardiac Doppler and Color Doppler Indications:    Elevated Troponin  History:        Patient has prior history of Echocardiogram examinations, most                 recent 10/17/2017. COPD; Signs/Symptoms:Altered Mental Status.                 Sepsis, elevated Troponin, schizophrenia, HIV, pneumonia.  Sonographer:    Dustin Flock RDCS Referring Phys:  [place]  1. Left ventricular ejection fraction, by estimation, is 60 to 65%. The left ventricle has normal function. The left ventricle has no regional wall motion abnormalities. There is mild left ventricular hypertrophy. Left ventricular diastolic parameters are consistent with Grade I diastolic dysfunction (impaired relaxation).  2. Right ventricular systolic function is mildly reduced. The right ventricular size is moderately enlarged. There is normal pulmonary artery systolic pressure.  3. The mitral valve is normal in structure. No evidence of mitral valve regurgitation. No evidence of mitral stenosis.  4. The aortic valve is tricuspid. Aortic valve regurgitation is not visualized. No aortic stenosis is present.  5. The inferior vena cava is normal in size with greater than 50% respiratory variability, suggesting right atrial pressure of 3 mmHg. FINDINGS  Left Ventricle: Left ventricular ejection fraction, by estimation, is 60 to 65%. The left ventricle has normal function. The left ventricle has no regional wall motion abnormalities. The left ventricular internal cavity size was normal in size. There is  mild left ventricular  hypertrophy. Left ventricular diastolic parameters are consistent with Grade I diastolic dysfunction (impaired relaxation). Normal left ventricular filling pressure. Right Ventricle: The right ventricular size is moderately enlarged. Right vetricular wall thickness was not assessed. Right ventricular systolic function is mildly reduced. There is normal pulmonary artery systolic pressure. The tricuspid regurgitant velocity is 2.45 m/s, and with an assumed right atrial pressure of 3 mmHg, the estimated right ventricular systolic pressure is 59.9 mmHg. Left Atrium: Left atrial size was normal in size. Right Atrium: Right atrial size was normal in size. Pericardium: There is no evidence of pericardial effusion. Mitral Valve: The mitral valve is normal in structure. No evidence of mitral valve regurgitation. No evidence of mitral valve stenosis. Tricuspid Valve: The tricuspid valve is not well visualized. Tricuspid valve regurgitation is trivial. No evidence of tricuspid stenosis. Aortic Valve: The aortic valve is tricuspid. Aortic valve regurgitation is not visualized. No aortic stenosis is present. Aortic valve mean gradient measures 5.2 mmHg. Aortic valve peak gradient measures 10.0 mmHg. Aortic valve area, by VTI measures 2.35  cm. Pulmonic Valve: The pulmonic valve was not well visualized. Pulmonic valve regurgitation is not visualized. No evidence of pulmonic stenosis. Aorta: The aortic root is normal in size and structure. Pulmonary Artery: Indeterminant PASP, inadequate TR jet. Venous: The inferior vena cava is normal in size with greater than 50% respiratory variability, suggesting right atrial pressure of 3 mmHg. IAS/Shunts: No atrial level shunt detected by color flow Doppler.  LEFT VENTRICLE PLAX 2D LVIDd:         3.63 cm  Diastology LVIDs:         2.39 cm  LV e' medial:    5.66 cm/s LV PW:         1.16 cm  LV E/e' medial:  9.8 LV IVS:        1.13 cm  LV e' lateral:   10.00 cm/s LVOT diam:     2.00 cm  LV  E/e' lateral: 5.5 LV SV:         57 LV SV Index:   27 LVOT Area:     3.14 cm  RIGHT VENTRICLE RV Basal diam:  3.96 cm RV S prime:     13.20 cm/s TAPSE (M-mode): 2.3 cm LEFT ATRIUM             Index       RIGHT ATRIUM  Index LA diam:        3.50 cm 1.69 cm/m  RA Area:     18.40 cm LA Vol (A2C):   55.9 ml 26.95 ml/m RA Volume:   51.60 ml  24.88 ml/m LA Vol (A4C):   42.8 ml 20.64 ml/m LA Biplane Vol: 51.7 ml 24.93 ml/m  AORTIC VALVE AV Area (Vmax):    2.12 cm AV Area (Vmean):   2.49 cm AV Area (VTI):     2.35 cm AV Vmax:           158.22 cm/s AV Vmean:          107.448 cm/s AV VTI:            0.242 m AV Peak Grad:      10.0 mmHg AV Mean Grad:      5.2 mmHg LVOT Vmax:         107.00 cm/s LVOT Vmean:        85.000 cm/s LVOT VTI:          0.181 m LVOT/AV VTI ratio: 0.75  AORTA Ao Root diam: 2.50 cm MITRAL VALVE               TRICUSPID VALVE MV Area (PHT): 5.46 cm    TR Peak grad:   24.0 mmHg MV Decel Time: 139 msec    TR Vmax:        245.00 cm/s MV E velocity: 55.20 cm/s MV A velocity: 83.70 cm/s  SHUNTS MV E/A ratio:  0.66        Systemic VTI:  0.18 m                            Systemic Diam: 2.00 cm Carlyle Dolly MD Electronically signed by Carlyle Dolly MD Signature Date/Time: 05/11/2021/5:26:32 PM    Final      Discharge Exam: Vitals:   05/15/21 0950 05/15/21 1000  BP: (!) 152/77 140/70  Pulse: 98   Resp: (!) 36 (!) 24  Temp:    SpO2: 91%    Vitals:   05/15/21 0434 05/15/21 0500 05/15/21 0950 05/15/21 1000  BP:  133/76 (!) 152/77 140/70  Pulse:   98   Resp:  (!) 21 (!) 36 (!) 24  Temp: 99 F (37.2 C)     TempSrc: Axillary     SpO2:   91%   Weight: 98.3 kg     Height:        General: Pt is alert, awake, not in acute distress Cardiovascular: RRR, S1/S2 +, no rubs, no gallops Respiratory: CTA bilaterally, no wheezing, no rhonchi, nasal cannula Abdominal: Soft, NT, ND, bowel sounds + Extremities: no edema, no cyanosis    The results of significant diagnostics from  this hospitalization (including imaging, microbiology, ancillary and laboratory) are listed below for reference.     Microbiology: Recent Results (from the past 240 hour(s))  Blood culture (routine single)     Status: None   Collection Time: 05/10/21 12:31 PM   Specimen: BLOOD  Result Value Ref Range Status   Specimen Description BLOOD LEFT ANTECUBITAL  Final   Special Requests   Final    BOTTLES DRAWN AEROBIC AND ANAEROBIC Blood Culture adequate volume   Culture   Final    NO GROWTH 5 DAYS Performed at [facility], [address] [place], [place] [address]    Report Status 05/15/2021 FINAL  Final  Urine culture  Status: None   Collection Time: 05/10/21 12:31 PM   Specimen: In/Out Cath Urine  Result Value Ref Range Status   Specimen Description   Final    IN/OUT CATH URINE Performed at [facility], [address] [place], [place] [address]    Special Requests   Final    NONE Performed at [facility], [address] [place], [place] [address]    Culture   Final    NO GROWTH Performed at [facility], [place] [address], [place], [place] [address]    Report Status 05/12/2021 FINAL  Final  Resp Panel by RT-PCR (Flu A&B, Covid) Nasopharyngeal Swab     Status: None   Collection Time: 05/10/21  2:58 PM   Specimen: Nasopharyngeal Swab; Nasopharyngeal(NP) swabs in vial transport medium  Result Value Ref Range Status   SARS Coronavirus 2 by RT PCR NEGATIVE NEGATIVE Final    Comment: (NOTE) SARS-CoV-2 target nucleic acids are NOT DETECTED.  The SARS-CoV-2 RNA is generally detectable in upper respiratory specimens during the acute phase of infection. The lowest concentration of SARS-CoV-2 viral copies this assay can detect is 138 copies/mL. A negative result does not preclude SARS-Cov-2 infection and should not be used as the sole basis for treatment or other patient management decisions. A negative result may occur with  improper specimen collection/handling, submission  of specimen other than nasopharyngeal swab, presence of viral mutation(s) within the areas targeted by this assay, and inadequate number of viral copies(<138 copies/mL). A negative result must be combined with clinical observations, patient history, and epidemiological information. The expected result is Negative.  Fact Sheet for Patients:  EntrepreneurPulse.com.au  Fact Sheet for Healthcare Providers:  IncredibleEmployment.be  This test is no t yet approved or cleared by the [place] FDA and  has been authorized for detection and/or diagnosis of SARS-CoV-2 by FDA under an Emergency Use Authorization (EUA). This EUA will remain  in effect (meaning this test can be used) for the duration of the COVID-19 declaration under Section 564(b)(1) of the Act, 21 U.S.C.section 360bbb-3(b)(1), unless the authorization is terminated  or revoked sooner.       Influenza A by PCR NEGATIVE NEGATIVE Final   Influenza B by PCR NEGATIVE NEGATIVE Final    Comment: (NOTE) The Xpert Xpress SARS-CoV-2/FLU/RSV plus assay is intended as an aid in the diagnosis of influenza from Nasopharyngeal swab specimens and should not be used as a sole basis for treatment. Nasal washings and aspirates are unacceptable for Xpert Xpress SARS-CoV-2/FLU/RSV testing.  Fact Sheet for Patients: EntrepreneurPulse.com.au  Fact Sheet for Healthcare Providers: IncredibleEmployment.be  This test is not yet approved or cleared by the [place] FDA and has been authorized for detection and/or diagnosis of SARS-CoV-2 by FDA under an Emergency Use Authorization (EUA). This EUA will remain in effect (meaning this test can be used) for the duration of the COVID-19 declaration under Section 564(b)(1) of the Act, 21 U.S.C. section 360bbb-3(b)(1), unless the authorization is terminated or revoked.  Performed at [facility], [address],  [place], [place] [address]   MRSA Next Gen by PCR, Nasal     Status: None   Collection Time: 05/11/21 12:45 PM   Specimen: Nasal Mucosa; Nasal Swab  Result Value Ref Range Status   MRSA by PCR Next Gen NOT DETECTED NOT DETECTED Final    Comment: (NOTE) The GeneXpert MRSA Assay (FDA approved for NASAL specimens only), is one component of a comprehensive MRSA colonization surveillance program. It is not  intended to diagnose MRSA infection nor to guide or monitor treatment for MRSA infections. Test performance is not FDA approved in patients less than 56 years old. Performed at [facility], [address] [place], [place] [address]      Labs: BNP (last 3 results) Recent Labs    05/10/21 1231  BNP 34.1   Basic Metabolic Panel: Recent Labs  Lab 05/11/21 0418 05/12/21 0352 05/13/21 0436 05/14/21 0456 05/15/21 0618  NA 141 147* 149* 146* 142  K 4.8 5.0 4.6 4.7 4.2  CL 111 119* 119* 116* 110  CO2 20* 22 24 26 27   GLUCOSE 84 100* 132* 103* 80  BUN 70* 49* 29* 18 15  CREATININE 3.36* 1.80* 1.04* 0.83 0.83  CALCIUM 7.7* 8.0* 8.4* 8.3* 8.2*  MG  --  2.5* 2.0 1.5* 1.6*   Liver Function Tests: Recent Labs  Lab 05/11/21 0418 05/12/21 0352 05/13/21 0436 05/14/21 0456 05/15/21 0618  AST 729* 522* 386* 253* 176*  ALT 224* 181* 160* 135* 114*  ALKPHOS 67 60 54 51 51  BILITOT 0.3 0.2* 0.2* 0.3 0.4  PROT 6.1* 5.7* 5.5* 5.4* 5.3*  ALBUMIN 2.8* 2.6* 2.4* 2.3* 2.2*   No results for input(s): LIPASE, AMYLASE in the last 168 hours. Recent Labs  Lab 05/10/21 1424 05/11/21 1154  AMMONIA 20 30   CBC: Recent Labs  Lab 05/10/21 1231 05/11/21 0418 05/12/21 0352 05/13/21 0436 05/14/21 0456 05/15/21 0618  WBC 5.4 6.7 6.1 7.4 7.2 7.3  NEUTROABS 3.0  --   --   --   --   --   HGB 14.9 12.3 12.5 11.7* 11.3* 10.6*  HCT 45.8 38.5 40.0 37.9 36.0 34.5*  MCV 97.9 100.5* 101.8* 102.2* 102.6* 102.7*  PLT 90* 80* 75* 87* 105* 143*   Cardiac Enzymes: No results for input(s): CKTOTAL,  CKMB, CKMBINDEX, TROPONINI in the last 168 hours. BNP: Invalid input(s): POCBNP CBG: Recent Labs  Lab 05/14/21 0645 05/14/21 1140 05/14/21 1610 05/14/21 2345 05/15/21 0614  GLUCAP 89 209* 88 92 81   D-Dimer No results for input(s): DDIMER in the last 72 hours. Hgb A1c No results for input(s): HGBA1C in the last 72 hours. Lipid Profile No results for input(s): CHOL, HDL, LDLCALC, TRIG, CHOLHDL, LDLDIRECT in the last 72 hours. Thyroid function studies No results for input(s): TSH, T4TOTAL, T3FREE, THYROIDAB in the last 72 hours.  Invalid input(s): FREET3 Anemia work up No results for input(s): VITAMINB12, FOLATE, FERRITIN, TIBC, IRON, RETICCTPCT in the last 72 hours. Urinalysis    Component Value Date/Time   COLORURINE YELLOW 05/10/2021 1231   APPEARANCEUR CLEAR 05/10/2021 1231   LABSPEC >1.030 (H) 05/10/2021 1231   PHURINE 5.5 05/10/2021 1231   GLUCOSEU NEGATIVE 05/10/2021 1231   HGBUR LARGE (A) 05/10/2021 1231   BILIRUBINUR SMALL (A) 05/10/2021 1231   KETONESUR NEGATIVE 05/10/2021 1231   PROTEINUR 30 (A) 05/10/2021 1231   NITRITE NEGATIVE 05/10/2021 1231   LEUKOCYTESUR SMALL (A) 05/10/2021 1231   Sepsis Labs Invalid input(s): PROCALCITONIN,  WBC,  LACTICIDVEN Microbiology Recent Results (from the past 240 hour(s))  Blood culture (routine single)     Status: None   Collection Time: 05/10/21 12:31 PM   Specimen: BLOOD  Result Value Ref Range Status   Specimen Description BLOOD LEFT ANTECUBITAL  Final   Special Requests   Final    BOTTLES DRAWN AEROBIC AND ANAEROBIC Blood Culture adequate volume   Culture   Final    NO GROWTH 5 DAYS Performed at [facility],  [address] [place], [place] [address]    Report Status 05/15/2021 FINAL  Final  Urine culture     Status: None   Collection Time: 05/10/21 12:31 PM   Specimen: In/Out Cath Urine  Result Value Ref Range Status   Specimen Description   Final    IN/OUT CATH URINE Performed at [facility], [address] [place], [place] [address]    Special Requests   Final    NONE Performed at [facility], [address] [place], [place] [address]    Culture   Final    NO GROWTH Performed at [facility], [place] [address], [place], [place] [address]    Report Status 05/12/2021 FINAL  Final  Resp Panel by RT-PCR (Flu A&B, Covid) Nasopharyngeal Swab     Status: None   Collection Time: 05/10/21  2:58 PM   Specimen: Nasopharyngeal Swab; Nasopharyngeal(NP) swabs in vial transport medium  Result Value Ref Range Status   SARS Coronavirus 2 by RT PCR NEGATIVE NEGATIVE Final    Comment: (NOTE) SARS-CoV-2 target nucleic acids are NOT DETECTED.  The SARS-CoV-2 RNA is generally detectable in upper respiratory specimens during the acute phase of infection. The lowest concentration of SARS-CoV-2 viral copies this assay can detect is 138 copies/mL. A negative result does not preclude SARS-Cov-2 infection and should not be used as the sole basis for treatment or other patient management decisions. A negative result may occur with  improper specimen collection/handling, submission of specimen other than nasopharyngeal swab, presence of viral mutation(s) within the areas targeted by this assay, and inadequate number of viral copies(<138 copies/mL). A negative result must be combined with clinical observations, patient history, and epidemiological information. The expected result is Negative.  Fact Sheet for Patients:  EntrepreneurPulse.com.au  Fact Sheet for Healthcare Providers:  IncredibleEmployment.be  This test is no t yet approved or cleared by the [place] FDA and  has been authorized for detection and/or diagnosis of SARS-CoV-2 by FDA under an Emergency Use Authorization (EUA). This EUA will remain  in effect (meaning this test can be used) for the duration of the COVID-19 declaration under Section 564(b)(1) of the Act, 21 U.S.C.section  360bbb-3(b)(1), unless the authorization is terminated  or revoked sooner.       Influenza A by PCR NEGATIVE NEGATIVE Final   Influenza B by PCR NEGATIVE NEGATIVE Final    Comment: (NOTE) The Xpert Xpress SARS-CoV-2/FLU/RSV plus assay is intended as an aid in the diagnosis of influenza from Nasopharyngeal swab specimens and should not be used as a sole basis for treatment. Nasal washings and aspirates are unacceptable for Xpert Xpress SARS-CoV-2/FLU/RSV testing.  Fact Sheet for Patients: EntrepreneurPulse.com.au  Fact Sheet for Healthcare Providers: IncredibleEmployment.be  This test is not yet approved or cleared by the [place] FDA and has been authorized for detection and/or diagnosis of SARS-CoV-2 by FDA under an Emergency Use Authorization (EUA). This EUA will remain in effect (meaning this test can be used) for the duration of the COVID-19 declaration under Section 564(b)(1) of the Act, 21 U.S.C. section 360bbb-3(b)(1), unless the authorization is terminated or revoked.  Performed at [facility], [address], [place], [place] [address]   MRSA Next Gen by PCR, Nasal     Status: None   Collection Time: 05/11/21 12:45 PM   Specimen: Nasal Mucosa; Nasal Swab  Result Value Ref Range Status   MRSA by PCR Next Gen NOT DETECTED NOT DETECTED Final    Comment: (NOTE) The  GeneXpert MRSA Assay (FDA approved for NASAL specimens only), is one component of a comprehensive MRSA colonization surveillance program. It is not intended to diagnose MRSA infection nor to guide or monitor treatment for MRSA infections. Test performance is not FDA approved in patients less than 54 years old. Performed at [facility], [address] [place], [place] [address]      Time coordinating discharge: 35 minutes  SIGNED:   Rodena Goldmann, DO [facility] 05/15/2021, 11:02 AM  If 7PM-7AM, please contact night-coverage [email or web address]

## 2021-05-15 NOTE — TOC Transition Note (Signed)
Transition of Care [facility]) - CM/SW Discharge Note   Patient Details  Name: Karina Clarke MRN: [id number] Date of Birth: Jun 19, 1961  Transition of Care [facility]) CM/SW Contact:  Boneta Lucks, RN Phone Number: 05/15/2021, 12:14 PM   Clinical Narrative:   Patient discharging today. TOC spoke with [name] to update him on SNF recommendation and non ambulatory status. Elberta Fortis and Eva/ Sister/Legal Guardian wants patient at [facility] with home health. Patient will go to a different location: [place]. [place], [place] [address].   TOC at the bedside to update [place] Pier with DC plan. RN getting patient ready, EMS scheduled, Medical necessity completed. FL2 and DC summary faxed to [facility].    Final next level of care: Other (comment) (Family care home.) Barriers to Discharge: Barriers Resolved  Patient Goals and CMS Choice Patient states their goals for this hospitalization and ongoing recovery are:: family wants her to return to [facility]. CMS Medicare.gov Compare Post Acute Care list provided to:: Patient Represenative (must comment) Choice offered to / list presented to : Sibling  Discharge Placement             Patient chooses bed at: [facility] [place], [place]) Patient to be transferred to facility by: EMS Name of family member notified: EVA - Legal Guardian/SIster Patient and family notified of of transfer: 05/15/21  Discharge Plan and Services     Readmission Risk Interventions Readmission Risk Prevention Plan 05/15/2021  Post Dischage Appt Complete  Medication Screening Complete  Transportation Screening Complete  Some recent data might be hidden

## 2021-05-15 NOTE — ED Triage Notes (Signed)
Discharged 2 hours ago after hospitalization for UTI and AMS. Arrived back at [place], caregiver said she was still altered, sent her to ED for evaluation.

## 2021-05-15 NOTE — H&P (Signed)
History and Physical  Karina Clarke ZOX:[id number] DOB: 1961/01/03 DOA: 05/15/2021  Referring physician: York Ram, MD PCP: Rosita Fire, MD  Patient coming from: Home  Chief Complaint: Altered mental status  HPI: Karina Clarke is a 60 y.o. female with medical history significant for schizophrenia, HIV, COPD who presents to the emergency department via EMS due to altered mental status.  Patient was unable to provide history, history was obtained from ED physician and ED medical record.  Per report, she was discharged on 6/21 due to acute metabolic encephalopathy and she was also treated for sepsis secondary to pneumonia and possible UTI, patient was discharged on Augmentin.  Apparently on returning home, she was initially responding appropriately, but she was left in the room and when family member went back to check on her, she was noted to have slumped over with drool on foam coming out of her mouth.  It was reported that her mental status remain changed from her baseline whereby she was able to partake in conversation (without meaning).  ED Course:  In the emergency department, she was hemodynamically stable.  Work-up in the ED showed macrocytic anemia and normal BMP, albumin 2.3, transaminitis and hypomagnesemia. CT of head without contrast showed no acute intracranial abnormalities Chest x-ray showed cardiomegaly with vascular congestion Magnesium was replenished, patient was started on IV ceftriaxone and azithromycin due to presumed pneumonia Hospitalist was asked to admit patient for further evaluation and management.   Review of Systems: This cannot be obtained at this time due to patient's current condition  Past Medical History:  Diagnosis Date   Atypical lobular hyperplasia of left breast 06/2015   Bloating    per sister   Chronic right hip pain    sister states circulation problem in hip   Family history of adverse reaction to anesthesia    sister has hx. of  post-op N/V   High cholesterol    HIV (human immunodeficiency virus infection) ([place])    Hypertension    Limp    Mild intellectual disability    No natural teeth    does not wear her dentures   Paranoid schizophrenia ([place])    Personality disorder ([place])    Schizophrenia ([place])    Past Surgical History:  Procedure Laterality Date   BREAST LUMPECTOMY WITH NEEDLE LOCALIZATION Left 07/28/2015   Procedure: BREAST LUMPECTOMY WITH NEEDLE LOCALIZATION;  Surgeon: Autumn Messing III, MD;  Location: [facility];  Service: General;  Laterality: Left;   COLONOSCOPY WITH PROPOFOL N/A 06/13/2015   SLF: 1. the left colon is redundant 2. the examination was otherwise normal 3. small internal hemorrhoids 4. moderate sized external hemorrhoids   HEMORRHOID SURGERY     UMBILICAL HERNIA REPAIR      Social History:  reports that she has been smoking cigarettes. She has a 18.00 pack-year smoking history. She has never used smokeless tobacco. She reports that she does not drink alcohol and does not use drugs.   Allergies  Allergen Reactions   Asa [Aspirin] Nausea And Vomiting    Family History  Problem Relation Age of Onset   Anesthesia problems Sister        post-op N/V   Diabetes Father      Prior to Admission medications   Medication Sig Start Date End Date Taking? Authorizing Provider  abacavir-lamiVUDine (EPZICOM) 600-300 MG per tablet Take 1 tablet by mouth daily.    [provider]  acetaminophen (TYLENOL) 650 MG CR tablet Take 650 mg by mouth  every 8 (eight) hours as needed for pain.    [provider]  albuterol (PROVENTIL HFA;VENTOLIN HFA) 108 (90 Base) MCG/ACT inhaler Inhale 1-2 puffs into the lungs 2 (two) times daily as needed for wheezing or shortness of breath.    [provider]  amoxicillin-clavulanate (AUGMENTIN) 875-125 MG tablet Take 1 tablet by mouth 2 (two) times daily for 3 days. 05/15/21 05/18/21  Manuella Ghazi, Pratik D, DO  atazanavir (REYATAZ) 200 MG  capsule Take 400 mg by mouth daily.     [provider]  divalproex (DEPAKOTE) 500 MG DR tablet Take 500 mg by mouth 2 (two) times daily. Take 1 tablet (500 mg) by mouth at 4pm and 8pm daily    [provider]  haloperidol (HALDOL) 10 MG tablet Take 20 mg by mouth 3 (three) times daily.     [provider]  haloperidol decanoate (HALDOL DECANOATE) 100 MG/ML injection Inject 200 mg into the muscle every 28 (twenty-eight) days. Last injection 06/09/15    [provider]  insulin aspart (NOVOLOG) 100 UNIT/ML injection Inject 0-20 Units into the skin 3 (three) times daily with meals.    [provider]  insulin detemir (LEVEMIR) 100 UNIT/ML injection Inject 0.33 mLs (33 Units total) into the skin at bedtime. 10/27/17   Isaac Bliss, Rayford Halsted, MD  Magnesium Oxide 400 MG CAPS Take 1 capsule (400 mg total) by mouth every morning. 08/10/17   Reyne Dumas, MD  metoprolol tartrate (LOPRESSOR) 25 MG tablet Take 1 tablet by mouth 2 (two) times daily. 05/03/21   [provider]  polyethylene glycol (MIRALAX / GLYCOLAX) packet Take 17 g by mouth daily as needed for mild constipation.     [provider]  QUEtiapine (SEROQUEL) 200 MG tablet Take 200 mg by mouth 2 (two) times daily. Take 1 tablet (200 mg) by mouth with supper and at bedtime daily    [provider]  raltegravir (ISENTRESS) 400 MG tablet Take 400 mg by mouth 2 (two) times daily.    [provider]  trihexyphenidyl (ARTANE) 2 MG tablet Take 2 mg by mouth 2 (two) times daily with a meal.    [provider]  valACYclovir (VALTREX) 1000 MG tablet Take 1,000 mg by mouth daily.     [provider]    Physical Exam: BP (!) 168/75   Pulse (!) 110   Temp 98.6 F (37 C) (Oral)   Resp 19   Ht 5\' 8"  (1.727 m)   Wt 98.3 kg   SpO2 97%   BMI 32.95 kg/m   General: 60 y.o. year-old female well developed well nourished in no acute distress.  Alert but  disoriented HEENT: NCAT, PERRL Neck: Supple, trachea medial Cardiovascular: Regular rate and rhythm with no rubs or gallops.  No thyromegaly or JVD noted.  No lower extremity edema. 2/4 pulses in all 4 extremities. Respiratory: Bilateral Rales in lower lobes with expiratory wheezes Abdomen: Soft, nontender nondistended with normal bowel sounds x4 quadrants. Muskuloskeletal: No cyanosis, clubbing or edema noted bilaterally Neuro: sensation, reflexes intact, no focal neurologic deficit, speech confused, movingall extremities Skin: No ulcerative lesions noted or rashes Psychiatry: Mood is appropriate for condition and setting          Labs on Admission:  Basic Metabolic Panel: Recent Labs  Lab 05/12/21 0352 05/13/21 0436 05/14/21 0456 05/15/21 0618 05/15/21 1913 05/16/21 0422  NA 147* 149* 146* 142 141 143  K 5.0 4.6 4.7 4.2 3.9 3.8  CL 119* 119*  116* 110 109 111  CO2 22 24 26 27 27 26   GLUCOSE 100* 132* 103* 80 98 92  BUN 49* 29* 18 15 16 14   CREATININE 1.80* 1.04* 0.83 0.83 0.78 0.64  CALCIUM 8.0* 8.4* 8.3* 8.2* 8.2* 8.6*  MG 2.5* 2.0 1.5* 1.6*  --  1.7  PHOS  --   --   --   --   --  2.3*   Liver Function Tests: Recent Labs  Lab 05/13/21 0436 05/14/21 0456 05/15/21 0618 05/15/21 1913 05/16/21 0422  AST 386* 253* 176* 163* 151*  ALT 160* 135* 114* 112* 108*  ALKPHOS 54 51 51 55 53  BILITOT 0.2* 0.3 0.4 0.4 0.5  PROT 5.5* 5.4* 5.3* 5.7* 5.6*  ALBUMIN 2.4* 2.3* 2.2* 2.3* 2.3*   No results for input(s): LIPASE, AMYLASE in the last 168 hours. Recent Labs  Lab 05/10/21 1424 05/11/21 1154  AMMONIA 20 30   CBC: Recent Labs  Lab 05/10/21 1231 05/11/21 0418 05/13/21 0436 05/14/21 0456 05/15/21 0618 05/15/21 1913 05/16/21 0422  WBC 5.4   < > 7.4 7.2 7.3 7.2 6.0  NEUTROABS 3.0  --   --   --   --  3.2  --   HGB 14.9   < > 11.7* 11.3* 10.6* 11.0* 10.9*  HCT 45.8   < > 37.9 36.0 34.5* 34.3* 33.7*  MCV 97.9   < > 102.2* 102.6* 102.7* 100.3* 98.3  PLT 90*   < >  87* 105* 143* 166 176   < > = values in this interval not displayed.   Cardiac Enzymes: No results for input(s): CKTOTAL, CKMB, CKMBINDEX, TROPONINI in the last 168 hours.  BNP (last 3 results) Recent Labs    05/10/21 1231  BNP 21.0    ProBNP (last 3 results) No results for input(s): PROBNP in the last 8760 hours.  CBG: Recent Labs  Lab 05/14/21 1610 05/14/21 2345 05/15/21 0614 05/15/21 1112 05/15/21 1852  GLUCAP 88 92 81 145* 105*    Radiological Exams on Admission: DG Chest 1 View  Result Date: 05/15/2021 CLINICAL DATA:  60 year old female with altered mental status. EXAM: CHEST  1 VIEW COMPARISON:  Chest radiograph dated 05/10/2021. FINDINGS: There is cardiomegaly with vascular congestion. Bibasilar densities represent atelectasis or infiltrate. There is no pleural effusion pneumothorax. Atherosclerotic calcification of the aortic arch. No acute osseous pathology. IMPRESSION: 1. Cardiomegaly with vascular congestion. 2. Bibasilar atelectasis or infiltrate. Electronically Signed   By: Anner Crete M.D.   On: 05/15/2021 18:13   CT Head Wo Contrast  Result Date: 05/15/2021 CLINICAL DATA:  Mental status changes. Discharge 2 hours ago after hospitalization for altered mental status and urinary tract infection. EXAM: CT HEAD WITHOUT CONTRAST TECHNIQUE: Contiguous axial images were obtained from the base of the skull through the vertex without intravenous contrast. COMPARISON:  05/10/21 FINDINGS: Brain: No evidence of acute infarction, hemorrhage, hydrocephalus, extra-axial collection or mass lesion/mass effect. There are patchy low-attenuation areas within the subcortical and periventricular white matter compatible with chronic microvascular disease. Vascular: No hyperdense vessel or unexpected calcification. Skull: Normal. Negative for fracture or focal lesion. Sinuses/Orbits: Air-fluid levels identified within the maxillary sinuses. Mild diffuse mucosal thickening with bubbly  opacification of the sphenoid sinus and ethmoid air cells. Mastoid air cells are clear. Other: None IMPRESSION: 1. No acute intracranial abnormalities. 2. Chronic small vessel ischemic disease. 3. Acute on chronic sinus inflammation. Electronically Signed   By: Kerby Moors M.D.   On: 05/15/2021 19:46  EKG: I independently viewed the EKG done and my findings are as followed: Normal sinus rhythm at rate of 91 bpm  Assessment/Plan Present on Admission:  Acute respiratory failure with hypoxia (HCC)  Transaminitis  Chronic paranoid schizophrenia ([place])  HIV (human immunodeficiency virus infection) ([place])  Active Problems:   HIV (human immunodeficiency virus infection) ([place])   Hospital-acquired pneumonia   Acute respiratory failure with hypoxia ([place])   Diabetes mellitus, new onset ([place])   Chronic paranoid schizophrenia ([place])   Transaminitis   Hypomagnesemia   Hypoalbuminemia   Essential hypertension  Acute respiratory failure with hypoxia secondary to presumed hospital-acquired pneumonia Continue Mucinex, she was started on IV ceftriaxone and azithromycin, we shall continue with same at this time with plan to de-escalate/discontinue based on procalcitonin, blood culture.  She was just discharged on 6/21 during which she was treated for sepsis secondary to pneumonia. Continue  incentive spirometry, flutter valve and chest PT Blood culture and sputum culture pending  Transaminitis AST and ALT continue to downtrend from prior readings Continue to monitor liver enzymes  Hypomagnesemia Mg 1.6, this was replenished  Hypoalbuminemia possibly secondary to moderate protein calorie malnutrition Protein supplement to be provided Bedside swallow eval prior to oral intake  Essential hypertension  HIV HIV viral load not detected 12/2020, stable CD4 count. Continue to hold HIV medications while n.p.o.    Schizophrenia Continue to hold haloperidol, Depakote while n.p.o.   Essential  hypertension Stable, continue home meds when patient resumes oral intake    T2 DM Random glucose 98. Continue sliding scale insulin and hypoglycemic protocol Hgba1c 6.0 (05/10/2021    DVT prophylaxis: Lovenox  Code Status: Full code  Family Communication: None at bedside  Disposition Plan:  Patient is from:                        home Anticipated DC to:                   SNF or family members home Anticipated DC date:               2-3 days Anticipated DC barriers:          Patient requires inpatient management due to acute respiratory failure with hypoxia with suspicion for pneumonia   Consults called: None  Admission status: Inpatient    Bernadette Hoit MD [facility]  05/16/2021, 6:06 AM

## 2021-05-15 NOTE — NC FL2 (Signed)
[place] LEVEL OF CARE SCREENING TOOL     IDENTIFICATION  Patient Name: Karina Clarke Birthdate: June 29, 1961 Sex: female Admission Date (Current Location): 05/10/2021  [facility] and [place] Number:  [employer] and Address:  [facility] [address] [place]      Provider Number: [id number]  Attending Physician Name and Address:  Rodena Goldmann, DO  Relative Name and Phone Number:  [name] - Legal Guardian [phone]    Current Level of Care: Hospital Recommended Level of Care: [facility] Prior Approval Number:    Date Approved/Denied:   PASRR Number:    Discharge Plan: Domiciliary (Rest home) ([facility])    Current Diagnoses: Patient Active Problem List   Diagnosis Date Noted   AMS (altered mental status) 05/10/2021   PNA (pneumonia) 05/10/2021   Transaminitis 05/10/2021   Diabetes mellitus, new onset ([place]) 10/24/2017   Hyperkalemia 10/24/2017   AKI (acute kidney injury) ([place]) 10/24/2017   COPD exacerbation ([place]) 10/21/2017   HCAP (healthcare-associated pneumonia) 10/12/2017   Acute respiratory failure with hypoxia ([place]) 10/12/2017   AIDS ([place]) 10/12/2017   Influenza 12/26/2016   Sepsis ([place]) 12/26/2016   CAP (community acquired pneumonia) 12/26/2016   Tobacco use disorder 12/26/2016   HIV (human immunodeficiency virus infection) ([place]) 12/26/2016   Schizophrenia ([place]) 12/26/2016   Personality disorder ([place]) 08/22/2016   Encounter for screening colonoscopy 05/24/2015   Vaginal discharge 11/04/2013   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   Chronic paranoid schizophrenia ([place]) 10/19/2011   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   Thrombocytopenia, unspecified ([place]) 10/19/2011    Orientation RESPIRATION BLADDER Height & Weight     Self  Normal External catheter Weight: 98.3 kg Height:  5\' 8"  (172.7 cm)  BEHAVIORAL SYMPTOMS/MOOD NEUROLOGICAL BOWEL NUTRITION STATUS       Incontinent Diet (See DC summary)  AMBULATORY STATUS COMMUNICATION OF NEEDS Skin   Extensive Assist Verbally Normal                       Personal Care Assistance Level of Assistance  Bathing, Feeding, Dressing Bathing Assistance: Maximum assistance Feeding assistance: Limited assistance Dressing Assistance: Maximum assistance     Functional Limitations Info  Sight, Hearing, Speech Sight Info: Adequate Hearing Info: Adequate Speech Info: Adequate    SPECIAL CARE FACTORS FREQUENCY  PT (By licensed PT)     PT Frequency: 3 times a week              Contractures Contractures Info: Not present    Additional Factors Info  Code Status, Allergies Code Status Info: Full Allergies Info: Aspirin           Current Medications (05/15/2021):  This is the current hospital active medication list Current Facility-Administered Medications  Medication Dose Route Frequency Provider Last Rate Last Admin   0.45 % sodium chloride infusion   Intravenous Continuous Heath Lark D, DO 75 mL/hr at 05/14/21 1624 Infusion Verify at 05/14/21 1624   acetaminophen (TYLENOL) tablet 650 mg  650 mg Oral Q6H PRN Heath Lark D, DO   650 mg at 05/13/21 0040   Ampicillin-Sulbactam (UNASYN) 3 g in sodium chloride 0.9 % 100 mL IVPB  3 g Intravenous Q8H Shah, Pratik D, DO 200 mL/hr at 05/15/21 0951 3 g at 05/15/21 0951   Chlorhexidine Gluconate Cloth 2 % PADS 6 each  6 each Topical Daily Emokpae, Ejiroghene E, MD   6 each at 05/14/21  9562   divalproex (DEPAKOTE) DR tablet 500 mg  500 mg Oral BID Heath Lark D, DO   500 mg at 05/14/21 2000   haloperidol (HALDOL) tablet 20 mg  20 mg Oral TID Heath Lark D, DO   20 mg at 05/15/21 0947   insulin aspart (novoLOG) injection 0-9 Units  0-9 Units Subcutaneous Q6H Emokpae, Ejiroghene E, MD   1 Units at 05/15/21 1123   metoprolol tartrate (LOPRESSOR) tablet 25 mg  25 mg Oral BID Manuella Ghazi, Pratik D, DO   25 mg at 05/15/21 0947   ondansetron (ZOFRAN) tablet 4 mg  4  mg Oral Q6H PRN Emokpae, Ejiroghene E, MD   4 mg at 05/13/21 1308   Or   ondansetron (ZOFRAN) injection 4 mg  4 mg Intravenous Q6H PRN Emokpae, Ejiroghene E, MD       polyethylene glycol (MIRALAX / GLYCOLAX) packet 17 g  17 g Oral Daily PRN Emokpae, Ejiroghene E, MD       QUEtiapine (SEROQUEL) tablet 200 mg  200 mg Oral BID Manuella Ghazi, Pratik D, DO   200 mg at 05/14/21 2215     Discharge Medications:  STOP taking these medications     ALPRAZolam 0.5 MG tablet Commonly known as: XANAX    atorvastatin 10 MG tablet Commonly known as: LIPITOR    HYDROcodone-acetaminophen 5-325 MG tablet Commonly known as: NORCO/VICODIN    hydrOXYzine 25 MG capsule Commonly known as: VISTARIL    ibuprofen 400 MG tablet Commonly known as: ADVIL    zolpidem 10 MG tablet Commonly known as: AMBIEN           TAKE these medications     abacavir-lamiVUDine 600-300 MG tablet Commonly known as: EPZICOM Take 1 tablet by mouth daily.    acetaminophen 650 MG CR tablet Commonly known as: TYLENOL Take 650 mg by mouth every 8 (eight) hours as needed for pain.    albuterol 108 (90 Base) MCG/ACT inhaler Commonly known as: VENTOLIN HFA Inhale 1-2 puffs into the lungs 2 (two) times daily as needed for wheezing or shortness of breath.    amoxicillin-clavulanate 875-125 MG tablet Commonly known as: Augmentin Take 1 tablet by mouth 2 (two) times daily for 3 days.    atazanavir 200 MG capsule Commonly known as: REYATAZ Take 400 mg by mouth daily.    divalproex 500 MG DR tablet Commonly known as: DEPAKOTE Take 500 mg by mouth 2 (two) times daily. Take 1 tablet (500 mg) by mouth at 4pm and 8pm daily    haloperidol 10 MG tablet Commonly known as: HALDOL Take 20 mg by mouth 3 (three) times daily.    haloperidol decanoate 100 MG/ML injection Commonly known as: HALDOL DECANOATE Inject 200 mg into the muscle every 28 (twenty-eight) days. Last injection 06/09/15    insulin aspart 100 UNIT/ML  injection Commonly known as: novoLOG Inject 0-20 Units into the skin 3 (three) times daily with meals.    insulin detemir 100 UNIT/ML injection Commonly known as: LEVEMIR Inject 0.33 mLs (33 Units total) into the skin at bedtime.    Magnesium Oxide 400 MG Caps Take 1 capsule (400 mg total) by mouth every morning.    metoprolol tartrate 25 MG tablet Commonly known as: LOPRESSOR Take 1 tablet by mouth 2 (two) times daily.    polyethylene glycol 17 g packet Commonly known as: MIRALAX / GLYCOLAX Take 17 g by mouth daily as needed for mild constipation.    QUEtiapine 200 MG tablet Commonly known as: SEROQUEL  Take 200 mg by mouth 2 (two) times daily. Take 1 tablet (200 mg) by mouth with supper and at bedtime daily    raltegravir 400 MG tablet Commonly known as: ISENTRESS Take 400 mg by mouth 2 (two) times daily.    trihexyphenidyl 2 MG tablet Commonly known as: ARTANE Take 2 mg by mouth 2 (two) times daily with a meal.    valACYclovir 1000 MG tablet Commonly known as: VALTREX Take 1,000 mg by mouth daily.       Relevant Imaging Results:  Relevant Lab Results:   Additional Information SS# [id number]  Boneta Lucks, RN

## 2021-05-15 NOTE — ED Provider Notes (Signed)
[facility] EMERGENCY DEPARTMENT Provider Note   CSN: [id number] Arrival date & time: 05/15/21  1719     History Chief Complaint  Patient presents with   Altered Mental Status    Karina Clarke is a 60 y.o. female.  Patient presents from nursing home altered mental status.  She was discharged yesterday after hospitalization for acute metabolic encephalopathy likely related to metabolic derangements.  She had a CT head which showed no acute findings.  Patient was prescribed Xanax outpatient and her UDS was only positive for that at the previous admission.  She also became septic secondary to pneumonia or possibly a UTI and was discharged on Augmentin.  Patient unable to provide history.  Called patient's nursing home and they reported that the patient returned home today and was responding appropriately but they left her in her room.  When she they went back she was slumped over with drool and foam coming out of her mouth.  She could still respond but was not responding as she normally does so they called EMS to bring her back to the emergency department.  They report that at baseline the patient will carry conversations but they may not make sense.  She responds yes or no to a lot of questions.  She may stay off the wall things.  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 of post-op N/V   High cholesterol    HIV (human immunodeficiency virus infection) ([place])    Hypertension    Limp    Mild intellectual disability    No natural teeth    does not wear her dentures   Paranoid schizophrenia ([place])    Personality disorder ([place])    Schizophrenia ([place])     Patient Active Problem List   Diagnosis Date Noted   AMS  (altered mental status) 05/10/2021   PNA (pneumonia) 05/10/2021   Transaminitis 05/10/2021   Diabetes mellitus, new onset ([place]) 10/24/2017   Hyperkalemia 10/24/2017   AKI (acute kidney injury) ([place]) 10/24/2017   COPD exacerbation ([place]) 10/21/2017   HCAP (healthcare-associated pneumonia) 10/12/2017   Acute respiratory failure with hypoxia ([place]) 10/12/2017   AIDS ([place]) 10/12/2017   Influenza 12/26/2016   Sepsis ([place]) 12/26/2016   CAP (community acquired pneumonia) 12/26/2016   Tobacco use disorder 12/26/2016   HIV (human immunodeficiency virus infection) ([place]) 12/26/2016   Schizophrenia ([place]) 12/26/2016   Personality disorder ([place]) 08/22/2016   Encounter for screening colonoscopy 05/24/2015   Vaginal discharge 11/04/2013   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 the left colon is redundant 2. the examination was otherwise normal 3. small internal hemorrhoids 4. moderate sized external hemorrhoids   HEMORRHOID SURGERY     UMBILICAL HERNIA REPAIR  OB History     Gravida  2   Para  2   Term      Preterm      AB      Living  2      SAB      IAB      Ectopic      Multiple      Live Births              Family History  Problem Relation Age of Onset   Anesthesia problems Sister        post-op N/V   Diabetes Father     Social History   Tobacco Use   Smoking status: Every Day    Packs/day: 1.00    Years: 18.00    Pack years: 18.00    Types: Cigarettes   Smokeless  tobacco: Never  Substance Use Topics   Alcohol use: No    Alcohol/week: 0.0 standard drinks   Drug use: No    Home Medications Prior to Admission medications   Medication Sig Start Date End Date Taking? Authorizing Provider  abacavir-lamiVUDine (EPZICOM) 600-300 MG per tablet Take 1 tablet by mouth daily.    [provider]  acetaminophen (TYLENOL) 650 MG CR tablet Take 650 mg by mouth every 8 (eight) hours as needed for pain.    [provider]  albuterol (PROVENTIL HFA;VENTOLIN HFA) 108 (90 Base) MCG/ACT inhaler Inhale 1-2 puffs into the lungs 2 (two) times daily as needed for wheezing or shortness of breath.    [provider]  amoxicillin-clavulanate (AUGMENTIN) 875-125 MG tablet Take 1 tablet by mouth 2 (two) times daily for 3 days. 05/15/21 05/18/21  Manuella Ghazi, Pratik D, DO  atazanavir (REYATAZ) 200 MG capsule Take 400 mg by mouth daily.     [provider]  divalproex (DEPAKOTE) 500 MG DR tablet Take 500 mg by mouth 2 (two) times daily. Take 1 tablet (500 mg) by mouth at 4pm and 8pm daily    [provider]  haloperidol (HALDOL) 10 MG tablet Take 20 mg by mouth 3 (three) times daily.     [provider]  haloperidol decanoate (HALDOL DECANOATE) 100 MG/ML injection Inject 200 mg into the muscle every 28 (twenty-eight) days. Last injection 06/09/15    [provider]  insulin aspart (NOVOLOG) 100 UNIT/ML injection Inject 0-20 Units into the skin 3 (three) times daily with meals.    [provider]  insulin detemir (LEVEMIR) 100 UNIT/ML injection Inject 0.33 mLs (33 Units total) into the skin at bedtime. 10/27/17   Isaac Bliss, Rayford Halsted, MD  Magnesium Oxide 400 MG CAPS Take 1 capsule (400 mg total) by mouth every morning. 08/10/17   Reyne Dumas, MD  metoprolol tartrate (LOPRESSOR) 25 MG tablet Take 1 tablet by mouth 2 (two) times daily. 05/03/21   [provider]  polyethylene glycol (MIRALAX / GLYCOLAX) packet Take  17 g by mouth daily as needed for mild constipation.     [provider]  QUEtiapine (SEROQUEL) 200 MG tablet Take 200 mg by mouth 2 (two) times daily. Take 1 tablet (200 mg) by mouth with supper and at bedtime daily    [provider]  raltegravir (ISENTRESS) 400 MG tablet Take 400 mg by mouth 2 (two) times daily.    [provider]  trihexyphenidyl (ARTANE) 2 MG tablet Take 2 mg by mouth 2 (two) times daily with a meal.    [provider]  valACYclovir (VALTREX)  1000 MG tablet Take 1,000 mg by mouth daily.     [provider]    Allergies    Asa [aspirin]  Review of Systems   Review of Systems  Unable to perform ROS: Mental status change   Physical Exam Updated Vital Signs Ht 5\' 8"  (1.727 m)   Wt 98.3 kg   BMI 32.95 kg/m   Physical Exam Vitals reviewed.  Constitutional:      General: She is not in acute distress.    Appearance: She is normal weight. She is not toxic-appearing.  HENT:     Head: Normocephalic and atraumatic.     Nose: Nose normal. No congestion.     Mouth/Throat:     Mouth: Mucous membranes are moist.     Pharynx: No oropharyngeal exudate.  Cardiovascular:     Rate and Rhythm: Normal rate and regular rhythm.     Pulses: Normal pulses.     Heart sounds: Normal heart sounds.  Pulmonary:     Effort: Pulmonary effort is normal.     Breath sounds: Wheezing (Expiratory wheezes noted as well as crackles in lung bases bilaterally) present.  Abdominal:     General: Abdomen is flat.     Palpations: Abdomen is soft.  Musculoskeletal:        General: Normal range of motion.  Skin:    General: Skin is warm and dry.  Neurological:     Mental Status: She is alert. She is disoriented.  Psychiatric:     Comments: Flat affect, patient responds yes to most questions.  Will occasionally say "yeah I am fine"    ED Results / Procedures / Treatments   Labs (all labs ordered are listed, but only abnormal results are  displayed) Labs Reviewed - No data to display  EKG None  Radiology No results found.  Procedures Procedures   Medications Ordered in ED Medications - No data to display  ED Course  I have reviewed the triage vital signs and the nursing notes.  Pertinent labs & imaging results that were available during my care of the patient were reviewed by me and considered in my medical decision making (see chart for details).    MDM Rules/Calculators/A&P                          Patient is 60 year old with recent hospitalization for altered mental status and elevated troponins.  She was discharged this afternoon and was found altered again this afternoon and brought back to the emergency department.  On arrival patient would respond yes to some questions and say "I am fine".  She denies any pain.  Reached out to living facility who reported her baseline she has conversations but they are nonsensical.  She usually does not just respond yes to questions.  They also reported that they found her with foam in her mouth and slumped over.  Physical exam shows no gross abnormalities.  Patient does have a new oxygen requirement and is satting 88 to 90% on 2 L nasal cannula.  Routine lab work had been ordered this morning prior to discharge and is being repeated now.  Chest x-ray also ordered showing bibasilar densities representing atelectasis or infiltrate.  Cardiomegaly with vascular congestion.  CT head, urine drug screen, urinalysis, urine culture pending at this time.  Lab work significant for stable creatinine 0.78, improving AST/ALT at 163/112 from 173/114.  WBCs stable at 7.2 and hemoglobin is stable at 11.  CT had showed no acute intracranial abnormalities with chronic small vessel disease and acute on chronic sinus inflammation.  Due to the new oxygen requirement and concern for worsening pneumonia patient was started on ceftriaxone and hospitalist team paged for admission.  Patient's caregiver  updated.  His main concerns is that she is still altered and that when she came home today she would not eat.  Had he plans on coming to the hospital tomorrow to see the patient and discussed with the nursing staff  Final Clinical Impression(s) / ED Diagnoses Final diagnoses:  None    Rx / DC Orders ED Discharge Orders     None        Gifford Shave, MD 05/15/21 2059    Elnora Morrison, MD 05/16/21 0006

## 2021-05-15 NOTE — Progress Notes (Signed)
Nsg Discharge Note  Admit Date:  05/10/2021 Discharge date: 05/15/2021   Alvester Morin to be D/C'd  Group home  per MD order.  AVS completed.  Copy for chart, and copy for patient signed, and dated. Patient/caregiver able to verbalize understanding.  Discharge Medication: Allergies as of 05/15/2021       Reactions   Asa [aspirin] Nausea And Vomiting        Medication List     STOP taking these medications    ALPRAZolam 0.5 MG tablet Commonly known as: XANAX   atorvastatin 10 MG tablet Commonly known as: LIPITOR   HYDROcodone-acetaminophen 5-325 MG tablet Commonly known as: NORCO/VICODIN   hydrOXYzine 25 MG capsule Commonly known as: VISTARIL   ibuprofen 400 MG tablet Commonly known as: ADVIL   zolpidem 10 MG tablet Commonly known as: AMBIEN       TAKE these medications    abacavir-lamiVUDine 600-300 MG tablet Commonly known as: EPZICOM Take 1 tablet by mouth daily.   acetaminophen 650 MG CR tablet Commonly known as: TYLENOL Take 650 mg by mouth every 8 (eight) hours as needed for pain.   albuterol 108 (90 Base) MCG/ACT inhaler Commonly known as: VENTOLIN HFA Inhale 1-2 puffs into the lungs 2 (two) times daily as needed for wheezing or shortness of breath.   amoxicillin-clavulanate 875-125 MG tablet Commonly known as: Augmentin Take 1 tablet by mouth 2 (two) times daily for 3 days.   atazanavir 200 MG capsule Commonly known as: REYATAZ Take 400 mg by mouth daily.   divalproex 500 MG DR tablet Commonly known as: DEPAKOTE Take 500 mg by mouth 2 (two) times daily. Take 1 tablet (500 mg) by mouth at 4pm and 8pm daily   haloperidol 10 MG tablet Commonly known as: HALDOL Take 20 mg by mouth 3 (three) times daily.   haloperidol decanoate 100 MG/ML injection Commonly known as: HALDOL DECANOATE Inject 200 mg into the muscle every 28 (twenty-eight) days. Last injection 06/09/15   insulin aspart 100 UNIT/ML injection Commonly known as: novoLOG Inject  0-20 Units into the skin 3 (three) times daily with meals.   insulin detemir 100 UNIT/ML injection Commonly known as: LEVEMIR Inject 0.33 mLs (33 Units total) into the skin at bedtime.   Magnesium Oxide 400 MG Caps Take 1 capsule (400 mg total) by mouth every morning.   metoprolol tartrate 25 MG tablet Commonly known as: LOPRESSOR Take 1 tablet by mouth 2 (two) times daily.   polyethylene glycol 17 g packet Commonly known as: MIRALAX / GLYCOLAX Take 17 g by mouth daily as needed for mild constipation.   QUEtiapine 200 MG tablet Commonly known as: SEROQUEL Take 200 mg by mouth 2 (two) times daily. Take 1 tablet (200 mg) by mouth with supper and at bedtime daily   raltegravir 400 MG tablet Commonly known as: ISENTRESS Take 400 mg by mouth 2 (two) times daily.   trihexyphenidyl 2 MG tablet Commonly known as: ARTANE Take 2 mg by mouth 2 (two) times daily with a meal.   valACYclovir 1000 MG tablet Commonly known as: VALTREX Take 1,000 mg by mouth daily.        Discharge Assessment: Vitals:   05/15/21 1000 05/15/21 1100  BP: 140/70 (!) 147/64  Pulse:  94  Resp: (!) 24 19  Temp:    SpO2:  93%   Skin clean, dry and intact without evidence of skin break down, no evidence of skin tears noted. IV catheter discontinued intact. Site without signs and symptoms  of complications - no redness or edema noted at insertion site, patient denies c/o pain - only slight tenderness at site.  Dressing with slight pressure applied.  D/c Instructions-Education: Discharge instructions given to patient/family with verbalized understanding. D/c education completed with patient/family including follow up instructions, medication list, d/c activities limitations if indicated, with other d/c instructions as indicated by MD - patient able to verbalize understanding, all questions fully answered. Patient instructed to return to ED, call 911, or call MD for any changes in condition.  Patient escorted  via stretcher, and D/C home EMS.  Carney Corners, RN 05/15/2021 2:10 PM

## 2021-05-16 ENCOUNTER — Encounter (HOSPITAL_COMMUNITY): Payer: Self-pay | Admitting: Internal Medicine

## 2021-05-16 DIAGNOSIS — I10 Essential (primary) hypertension: Secondary | ICD-10-CM

## 2021-05-16 DIAGNOSIS — E8809 Other disorders of plasma-protein metabolism, not elsewhere classified: Secondary | ICD-10-CM

## 2021-05-16 LAB — RAPID URINE DRUG SCREEN, HOSP PERFORMED
Amphetamines: NOT DETECTED
Barbiturates: NOT DETECTED
Benzodiazepines: POSITIVE — AB
Cocaine: NOT DETECTED
Opiates: NOT DETECTED
Tetrahydrocannabinol: NOT DETECTED

## 2021-05-16 LAB — CBC
HCT: 33.7 % — ABNORMAL LOW (ref 36.0–46.0)
Hemoglobin: 10.9 g/dL — ABNORMAL LOW (ref 12.0–15.0)
MCH: 31.8 pg (ref 26.0–34.0)
MCHC: 32.3 g/dL (ref 30.0–36.0)
MCV: 98.3 fL (ref 80.0–100.0)
Platelets: 176 10*3/uL (ref 150–400)
RBC: 3.43 MIL/uL — ABNORMAL LOW (ref 3.87–5.11)
RDW: 15.9 % — ABNORMAL HIGH (ref 11.5–15.5)
WBC: 6 10*3/uL (ref 4.0–10.5)
nRBC: 0.3 % — ABNORMAL HIGH (ref 0.0–0.2)

## 2021-05-16 LAB — COMPREHENSIVE METABOLIC PANEL
ALT: 108 U/L — ABNORMAL HIGH (ref 0–44)
AST: 151 U/L — ABNORMAL HIGH (ref 15–41)
Albumin: 2.3 g/dL — ABNORMAL LOW (ref 3.5–5.0)
Alkaline Phosphatase: 53 U/L (ref 38–126)
Anion gap: 6 (ref 5–15)
BUN: 14 mg/dL (ref 6–20)
CO2: 26 mmol/L (ref 22–32)
Calcium: 8.6 mg/dL — ABNORMAL LOW (ref 8.9–10.3)
Chloride: 111 mmol/L (ref 98–111)
Creatinine, Ser: 0.64 mg/dL (ref 0.44–1.00)
GFR, Estimated: >60 mL/min (ref >60)
Glucose, Bld: 92 mg/dL (ref 70–99)
Potassium: 3.8 mmol/L (ref 3.5–5.1)
Sodium: 143 mmol/L (ref 135–145)
Total Bilirubin: 0.5 mg/dL (ref 0.3–1.2)
Total Protein: 5.6 g/dL — ABNORMAL LOW (ref 6.5–8.1)

## 2021-05-16 LAB — MAGNESIUM: Magnesium: 1.7 mg/dL (ref 1.7–2.4)

## 2021-05-16 LAB — URINALYSIS, ROUTINE W REFLEX MICROSCOPIC
Bacteria, UA: NONE SEEN
Bilirubin Urine: NEGATIVE
Glucose, UA: NEGATIVE mg/dL
Ketones, ur: 5 mg/dL — AB
Nitrite: NEGATIVE
Protein, ur: NEGATIVE mg/dL
Specific Gravity, Urine: 1.016 (ref 1.005–1.030)
pH: 6 (ref 5.0–8.0)

## 2021-05-16 LAB — CBG MONITORING, ED: Glucose-Capillary: 82 mg/dL (ref 70–99)

## 2021-05-16 LAB — GLUCOSE, CAPILLARY
Glucose-Capillary: 99 mg/dL (ref 70–99)
Glucose-Capillary: 134 mg/dL — ABNORMAL HIGH (ref 70–99)
Glucose-Capillary: 162 mg/dL — ABNORMAL HIGH (ref 70–99)

## 2021-05-16 LAB — APTT: aPTT: 30 seconds (ref 24–36)

## 2021-05-16 LAB — PROTIME-INR
INR: 1.1 (ref 0.8–1.2)
Prothrombin Time: 13.8 seconds (ref 11.4–15.2)

## 2021-05-16 LAB — SARS CORONAVIRUS 2 (TAT 6-24 HRS): SARS Coronavirus 2: NEGATIVE

## 2021-05-16 LAB — PHOSPHORUS: Phosphorus: 2.3 mg/dL — ABNORMAL LOW (ref 2.5–4.6)

## 2021-05-16 LAB — PROCALCITONIN: Procalcitonin: 0.16 ng/mL

## 2021-05-16 MED ORDER — HALOPERIDOL 5 MG PO TABS
10.0000 mg | ORAL_TABLET | Freq: Three times a day (TID) | ORAL | Status: DC
  Administered 2021-05-16 – 2021-05-22 (×19): 10 mg via ORAL
  Filled 2021-05-16 (×18): qty 2

## 2021-05-16 MED ORDER — METHYLPREDNISOLONE 4 MG PO TABS
16.0000 mg | ORAL_TABLET | Freq: Every day | ORAL | Status: DC
  Filled 2021-05-16 (×2): qty 1

## 2021-05-16 MED ORDER — TRIHEXYPHENIDYL HCL 2 MG PO TABS
2.0000 mg | ORAL_TABLET | Freq: Two times a day (BID) | ORAL | Status: DC
  Administered 2021-05-16 – 2021-05-22 (×11): 2 mg via ORAL
  Filled 2021-05-16 (×15): qty 1

## 2021-05-16 MED ORDER — INSULIN DETEMIR 100 UNIT/ML ~~LOC~~ SOLN
20.0000 [IU] | Freq: Every day | SUBCUTANEOUS | Status: DC
  Administered 2021-05-16 – 2021-05-17 (×2): 20 [IU] via SUBCUTANEOUS
  Filled 2021-05-16 (×5): qty 0.2

## 2021-05-16 MED ORDER — INSULIN ASPART 100 UNIT/ML IJ SOLN
0.0000 [IU] | INTRAMUSCULAR | Status: DC
  Administered 2021-05-16: 1 [IU] via SUBCUTANEOUS

## 2021-05-16 MED ORDER — METHYLPREDNISOLONE 16 MG PO TABS
16.0000 mg | ORAL_TABLET | Freq: Every day | ORAL | Status: DC
  Filled 2021-05-16: qty 1

## 2021-05-16 MED ORDER — METHYLPREDNISOLONE SODIUM SUCC 125 MG IJ SOLR
48.0000 mg | Freq: Once | INTRAMUSCULAR | Status: AC
  Administered 2021-05-16: 48 mg via INTRAVENOUS
  Filled 2021-05-16: qty 2

## 2021-05-16 MED ORDER — METHYLPREDNISOLONE 4 MG PO TABS: 4.0000 mg | ORAL_TABLET | Freq: Every day | ORAL | Status: DC

## 2021-05-16 MED ORDER — ALPRAZOLAM 0.25 MG PO TABS
0.2500 mg | ORAL_TABLET | Freq: Three times a day (TID) | ORAL | Status: DC
  Administered 2021-05-16 – 2021-05-22 (×18): 0.25 mg via ORAL
  Filled 2021-05-16 (×19): qty 1

## 2021-05-16 MED ORDER — HYDROXYZINE HCL 25 MG PO TABS
25.0000 mg | ORAL_TABLET | Freq: Three times a day (TID) | ORAL | Status: DC | PRN
  Administered 2021-05-16 – 2021-05-20 (×4): 25 mg via ORAL
  Filled 2021-05-16 (×5): qty 1

## 2021-05-16 MED ORDER — MAGNESIUM OXIDE -MG SUPPLEMENT 400 (240 MG) MG PO TABS
400.0000 mg | ORAL_TABLET | Freq: Every morning | ORAL | Status: DC
  Administered 2021-05-17 – 2021-05-22 (×6): 400 mg via ORAL
  Filled 2021-05-16 (×6): qty 1

## 2021-05-16 MED ORDER — METHYLPREDNISOLONE 4 MG PO TABS: 8.0000 mg | ORAL_TABLET | Freq: Every day | ORAL | Status: DC

## 2021-05-16 MED ORDER — ENSURE ENLIVE PO LIQD
237.0000 mL | Freq: Two times a day (BID) | ORAL | Status: DC
  Administered 2021-05-17 – 2021-05-22 (×11): 237 mL via ORAL
  Filled 2021-05-16 (×4): qty 237

## 2021-05-16 MED ORDER — SUCRALFATE 1 GM/10ML PO SUSP
1.0000 g | Freq: Three times a day (TID) | ORAL | Status: DC
  Administered 2021-05-16 – 2021-05-17 (×2): 1 g via ORAL
  Filled 2021-05-16 (×2): qty 10

## 2021-05-16 MED ORDER — HALOPERIDOL DECANOATE 100 MG/ML IM SOLN: 200.0000 mg | INTRAMUSCULAR | Status: DC

## 2021-05-16 MED ORDER — DIVALPROEX SODIUM 250 MG PO DR TAB
500.0000 mg | DELAYED_RELEASE_TABLET | Freq: Two times a day (BID) | ORAL | Status: DC
  Administered 2021-05-16 (×2): 500 mg via ORAL
  Filled 2021-05-16 (×2): qty 2

## 2021-05-16 MED ORDER — ATORVASTATIN CALCIUM 10 MG PO TABS
10.0000 mg | ORAL_TABLET | Freq: Every day | ORAL | Status: DC
  Administered 2021-05-17 – 2021-05-22 (×6): 10 mg via ORAL
  Filled 2021-05-16 (×6): qty 1

## 2021-05-16 MED ORDER — METOPROLOL TARTRATE 25 MG PO TABS
25.0000 mg | ORAL_TABLET | Freq: Two times a day (BID) | ORAL | Status: DC
  Administered 2021-05-16 – 2021-05-19 (×7): 25 mg via ORAL
  Filled 2021-05-16 (×8): qty 1

## 2021-05-16 MED ORDER — DM-GUAIFENESIN ER 30-600 MG PO TB12
1.0000 | ORAL_TABLET | Freq: Two times a day (BID) | ORAL | Status: DC
  Administered 2021-05-16 (×2): 1 via ORAL
  Filled 2021-05-16 (×2): qty 1

## 2021-05-16 MED ORDER — CHLORHEXIDINE GLUCONATE CLOTH 2 % EX PADS
6.0000 | MEDICATED_PAD | Freq: Every day | CUTANEOUS | Status: DC
  Administered 2021-05-16 – 2021-05-22 (×7): 6 via TOPICAL

## 2021-05-16 NOTE — ED Notes (Signed)
Pt soiled bed, pt cleaned, peri-care performed, bed linen changed.

## 2021-05-16 NOTE — ED Notes (Signed)
Attempted to give report 

## 2021-05-16 NOTE — Progress Notes (Signed)
Patient transported via stretcher to icu room 9. Assisted to bed. 2 rn skin assessment completed. Vitals obtained. Oriented to room. Patient remains alert but unable to communicate purposefully. CHG completed. Remains on o2 via [place] at 2L

## 2021-05-16 NOTE — Progress Notes (Signed)
PROGRESS NOTE    Patient: Karina Clarke                            PCP: Rosita Fire, MD                    DOB: Jun 26, 1961            DOA: 05/15/2021 GYJ:[id number]             DOS: 05/16/2021, 11:37 AM   LOS: 1 day   Date of Service: The patient was seen and examined on 05/16/2021  Subjective:   The patient was seen and examined this morning. Hemodynamically stable, patient is talking, speech is garbled, does not make sense, she does not follow command she does say yes to her name.  Per nursing staff, this is at her baseline Noted that she is not ambulating at baseline  Brief Narrative:   Karina Clarke is a 60 y.o. female with medical history significant for schizophrenia, HIV, COPD.  Patient was brought from nursing home with reports of unresponsiveness, with possible overdose.  She was given some Narcan prior to arrival as well as in the ED with some improvement in responsiveness.  She has been admitted with acute metabolic encephalopathy likely multifactorial in the setting of pneumonia, azotemia/AKI, and elevated liver enzymes.  GI consulted to assist with evaluation of liver injury with labs showing signs of improvement   Assessment & Plan:   Active Problems:   HIV (human immunodeficiency virus infection) ([place])   Hospital-acquired pneumonia   Acute respiratory failure with hypoxia ([facility])   Diabetes mellitus, new onset ([place])   Chronic paranoid schizophrenia ([place])   Transaminitis   Hypomagnesemia   Hypoalbuminemia   Essential hypertension       Acute metabolic encephalopathy-multifactorial; appears to be back to baseline Stable -Per nursing staff back to baseline (awake garbled speech does not make sense poor insight) -Likely related to metabolic derangements that are also showing signs of improvement -Currently on full liquid diet, will be on dysphagia 3 -Head CT without acute findings -Continue to treat AKI as well as sepsis -UDS only positive for  benzodiazepines with Xanax prescription outpatient -PT evaluation >>> recommending SNF   AKI-improving -Baseline 1-1.3 >> 0.64  -Continue on half-normal saline due to hypernatremia -Rechecking labs -Monitor urine output   Severe sepsis secondary to pneumonia and also possible UTI -Continue IV Unasyn and discontinue further azithromycin -Urine cultures no growth -Blood cultures negative   Transaminitis-improving -Possibly due to drug-induced liver injury versus ischemia -Appreciate ongoing GI evaluation -Continue to follow a.m. labs -stable    Hypernatremia-improving -Changed IV fluid to LR, following LABs    Elevated troponin -2D echocardiogram 60-65% EF with grade 1 diastolic dysfunction   Thrombocytopenia- Stable  -Likely related to sepsis -Continue to monitor trend -Avoid heparin agents   HIV -HIV viral load not detected 12/2020 with stable CD4 count -Hold medications for now   Schizophrenia -Resume Haldol, Depakote, and Seroquel -Xanax may be resumed upon discharge   Hypertension-stable   Type 2 diabetes-with hypoglycemia -Hemoglobin A1c 6.0% -Continue - D5 IV fluid    Family Communication: None at bedside, called sister 6/17, tried calling 6/20 with no response  ---------------------------------------------------------------------------------------------------------------------------------------------------- Cultures; Blood Cultures x 2 >> NGT Urine Culture  >>> NGT  Sputum Culture >> NGT   Antimicrobials:    Consultants: None   ------------------------------------------------------------------------------------------------------------------------------------------------  DVT prophylaxis:  SCD/Compression stockings Code Status:  Code Status: Full Code  Family Communication: No family member present at bedside- attempt will be made to update daily The above findings and plan of care has been discussed with patient (and family)  in detail,   they expressed understanding and agreement of above. -Advance care planning has been discussed.   Admission status:   Status is: Inpatient  Remains inpatient appropriate because:Inpatient level of care appropriate due to severity of illness  Dispo: The patient is from: Home              Anticipated d/c is to: SNF in 1-2 days               Patient currently is not medically stable to d/c.   Difficult to place patient No      Level of care: Med-Surg   Procedures:   No admission procedures for hospital encounter.    Antimicrobials:  Anti-infectives (From admission, onward)    Start     Dose/Rate Route Frequency Ordered Stop   05/15/21 2130  azithromycin (ZITHROMAX) 500 mg in sodium chloride 0.9 % 250 mL IVPB        500 mg 250 mL/hr over 60 Minutes Intravenous Every 24 hours 05/15/21 2127     05/15/21 2100  cefTRIAXone (ROCEPHIN) 1 g in sodium chloride 0.9 % 100 mL IVPB        1 g 200 mL/hr over 30 Minutes Intravenous Every 24 hours 05/15/21 2053          Medication:   Chlorhexidine Gluconate Cloth  6 each Topical Daily   dextromethorphan-guaiFENesin  1 tablet Oral BID   enoxaparin (LOVENOX) injection  40 mg Subcutaneous Q24H   feeding supplement  237 mL Oral BID BM   insulin aspart  0-6 Units Subcutaneous Q4H   [START ON 05/17/2021] methylPREDNISolone  16 mg Oral Q breakfast   Followed by   Derrill Memo ON 05/21/2021] methylPREDNISolone  8 mg Oral Q breakfast   Followed by   Derrill Memo ON 05/28/2021] methylPREDNISolone  4 mg Oral Q breakfast   [START ON 05/17/2021] methylPREDNISolone  16 mg Oral Q1400   Followed by   Derrill Memo ON 05/19/2021] methylPREDNISolone  8 mg Oral Q1400   Followed by   Derrill Memo ON 05/22/2021] methylPREDNISolone  4 mg Oral Q1400   [START ON 05/17/2021] methylPREDNISolone  16 mg Oral QHS   Followed by   Derrill Memo ON 05/20/2021] methylPREDNISolone  8 mg Oral QHS   Followed by   Derrill Memo ON 05/23/2021] methylPREDNISolone  4 mg Oral QHS       Objective:    Vitals:   05/16/21 0300 05/16/21 0500 05/16/21 0630 05/16/21 0946  BP: (!) 142/70 (!) 168/75 (!) 152/78 (!) 156/83  Pulse: 91 (!) 110 (!) 109 (!) 107  Resp: 18 19 (!) 21 20  Temp:    98.6 F (37 C)  TempSrc:    Oral  SpO2: 98% 97% 98%   Weight:    98.6 kg  Height:    5\' 8"  (1.727 m)    Intake/Output Summary (Last 24 hours) at 05/16/2021 1137 Last data filed at 05/15/2021 2346 Gross per 24 hour  Intake 37.31 ml  Output --  Net 37.31 ml   Filed Weights   05/15/21 1724 05/16/21 0946  Weight: 98.3 kg 98.6 kg     Examination:   Physical Exam  Constitution: Awake, speech is garbled, does not make sense, does not follow command Psychiatric: Poor insight, garbled speech, no signs of agitation HEENT: Normocephalic, PERRL,  otherwise with in Normal limits  Chest:Chest symmetric Cardio vascular:  S1/S2, RRR, No murmure, No Rubs or Gallops  pulmonary: Clear to auscultation bilaterally, respirations unlabored, negative wheezes / crackles Abdomen: Soft, non-tender, non-distended, bowel sounds,no masses, no organomegaly Muscular skeletal: Severe generalized weaknesses-Per record patient is nonambulatory at baseline Limited exam - in bed, positive pulses bilaterally negative for any edema Neuro: Limited exam CNII-XII intact. ,  With minimum lower end upper extremity movement in bed  extremities: No pitting edema lower extremities, +2 pulses  Skin: Dry, warm to touch, negative for any Rashes,  Wounds: per nursing documentation    ------------------------------------------------------------------------------------------------------------------------------------------    LABs:  CBC Latest Ref Rng & Units 05/16/2021 05/15/2021 05/15/2021  WBC 4.0 - 10.5 K/uL 6.0 7.2 7.3  Hemoglobin 12.0 - 15.0 g/dL 10.9(L) 11.0(L) 10.6(L)  Hematocrit 36.0 - 46.0 % 33.7(L) 34.3(L) 34.5(L)  Platelets 150 - 400 K/uL 176 166 143(L)   CMP Latest Ref Rng & Units 05/16/2021 05/15/2021 05/15/2021  Glucose 70 -  99 mg/dL 92 98 80  BUN 6 - 20 mg/dL 14 16 15   Creatinine 0.44 - 1.00 mg/dL 0.64 0.78 0.83  Sodium 135 - 145 mmol/L 143 141 142  Potassium 3.5 - 5.1 mmol/L 3.8 3.9 4.2  Chloride 98 - 111 mmol/L 111 109 110  CO2 22 - 32 mmol/L 26 27 27   Calcium 8.9 - 10.3 mg/dL 8.6(L) 8.2(L) 8.2(L)  Total Protein 6.5 - 8.1 g/dL 5.6(L) 5.7(L) 5.3(L)  Total Bilirubin 0.3 - 1.2 mg/dL 0.5 0.4 0.4  Alkaline Phos 38 - 126 U/L 53 55 51  AST 15 - 41 U/L 151(H) 163(H) 176(H)  ALT 0 - 44 U/L 108(H) 112(H) 114(H)       Micro Results Recent Results (from the past 240 hour(s))  Blood culture (routine single)     Status: None   Collection Time: 05/10/21 12:31 PM   Specimen: BLOOD  Result Value Ref Range Status   Specimen Description BLOOD LEFT ANTECUBITAL  Final   Special Requests   Final    BOTTLES DRAWN AEROBIC AND ANAEROBIC Blood Culture adequate volume   Culture   Final    NO GROWTH 5 DAYS Performed at [facility], [address], [place], [place] [address]    Report Status 05/15/2021 FINAL  Final  Urine culture     Status: None   Collection Time: 05/10/21 12:31 PM   Specimen: In/Out Cath Urine  Result Value Ref Range Status   Specimen Description   Final    IN/OUT CATH URINE Performed at [facility], [address], [place], [place] [address]    Special Requests   Final    NONE Performed at [facility], [address], [place], [place] [address]    Culture   Final    NO GROWTH Performed at [facility], [place] [address] [place], [place] [address]    Report Status 05/12/2021 FINAL  Final  Resp Panel by RT-PCR (Flu A&B, Covid) Nasopharyngeal Swab     Status: None   Collection Time: 05/10/21  2:58 PM   Specimen: Nasopharyngeal Swab; Nasopharyngeal(NP) swabs in vial transport medium  Result Value Ref Range Status   SARS Coronavirus 2 by RT PCR NEGATIVE NEGATIVE Final    Comment: (NOTE) SARS-CoV-2 target nucleic acids are NOT DETECTED.  The SARS-CoV-2 RNA is generally detectable in upper  respiratory specimens during the acute phase of infection. The lowest concentration of SARS-CoV-2 viral copies this assay can detect is 138 copies/mL. A negative  result does not preclude SARS-Cov-2 infection and should not be used as the sole basis for treatment or other patient management decisions. A negative result may occur with  improper specimen collection/handling, submission of specimen other than nasopharyngeal swab, presence of viral mutation(s) within the areas targeted by this assay, and inadequate number of viral copies(<138 copies/mL). A negative result must be combined with clinical observations, patient history, and epidemiological information. The expected result is Negative.  Fact Sheet for Patients:  EntrepreneurPulse.com.au  Fact Sheet for Healthcare Providers:  IncredibleEmployment.be  This test is no t yet approved or cleared by the [place] FDA and  has been authorized for detection and/or diagnosis of SARS-CoV-2 by FDA under an Emergency Use Authorization (EUA). This EUA will remain  in effect (meaning this test can be used) for the duration of the COVID-19 declaration under Section 564(b)(1) of the Act, 21 U.S.C.section 360bbb-3(b)(1), unless the authorization is terminated  or revoked sooner.       Influenza A by PCR NEGATIVE NEGATIVE Final   Influenza B by PCR NEGATIVE NEGATIVE Final    Comment: (NOTE) The Xpert Xpress SARS-CoV-2/FLU/RSV plus assay is intended as an aid in the diagnosis of influenza from Nasopharyngeal swab specimens and should not be used as a sole basis for treatment. Nasal washings and aspirates are unacceptable for Xpert Xpress SARS-CoV-2/FLU/RSV testing.  Fact Sheet for Patients: EntrepreneurPulse.com.au  Fact Sheet for Healthcare Providers: IncredibleEmployment.be  This test is not yet approved or cleared by the [place] FDA and has been  authorized for detection and/or diagnosis of SARS-CoV-2 by FDA under an Emergency Use Authorization (EUA). This EUA will remain in effect (meaning this test can be used) for the duration of the COVID-19 declaration under Section 564(b)(1) of the Act, 21 U.S.C. section 360bbb-3(b)(1), unless the authorization is terminated or revoked.  Performed at [facility], [address], [place], [place] [address]   MRSA Next Gen by PCR, Nasal     Status: None   Collection Time: 05/11/21 12:45 PM   Specimen: Nasal Mucosa; Nasal Swab  Result Value Ref Range Status   MRSA by PCR Next Gen NOT DETECTED NOT DETECTED Final    Comment: (NOTE) The GeneXpert MRSA Assay (FDA approved for NASAL specimens only), is one component of a comprehensive MRSA colonization surveillance program. It is not intended to diagnose MRSA infection nor to guide or monitor treatment for MRSA infections. Test performance is not FDA approved in patients less than 58 years old. Performed at [facility], [address] [place], [place] [address]     Radiology Reports CT ABDOMEN PELVIS WO CONTRAST  Result Date: 05/10/2021 CLINICAL DATA:  Lethargy. Abdominal pain. Elevated liver enzymes. Acute kidney injury. Altered mental status. History of umbilical hernia. EXAM: CT ABDOMEN AND PELVIS WITHOUT CONTRAST TECHNIQUE: Multidetector CT imaging of the abdomen and pelvis was performed following the standard protocol without IV contrast. COMPARISON:  None. FINDINGS: Lower chest: Hypoventilatory changes at the dependent lung bases. Coronary atherosclerosis. Hepatobiliary: Normal liver size. No liver mass. Normal gallbladder with no radiopaque cholelithiasis. No biliary ductal dilatation. Pancreas: Normal, with no mass or duct dilation. Spleen: Normal size. No mass. Adrenals/Urinary Tract: Normal adrenals. No renal stones. No hydronephrosis. No contour deforming renal masses. Bladder collapsed by indwelling Foley catheter and grossly normal.  Stomach/Bowel: Normal non-distended stomach. Normal caliber small bowel with no small bowel wall thickening. Normal appendix. Normal large bowel with no diverticulosis, large bowel wall thickening or pericolonic fat stranding. Vascular/Lymphatic: Atherosclerotic nonaneurysmal abdominal  aorta. No pathologically enlarged lymph nodes in the abdomen or pelvis. Reproductive: Grossly normal uterus.  No adnexal mass. Other: No pneumoperitoneum. Trace free fluid in the pelvic cul-de-sac. No focal fluid collections. No evidence of a ventral abdominal hernia. Musculoskeletal: No aggressive appearing focal osseous lesions. IMPRESSION: 1. No acute abnormality. No hydronephrosis. No evidence of bowel obstruction or acute bowel inflammation. Normal appendix. 2. Trace free fluid in the pelvic cul-de-sac, nonspecific. No adnexal masses. 3. Coronary atherosclerosis. 4. Aortic Atherosclerosis (ICD10-I70.0). Electronically Signed   By: Ilona Sorrel M.D.   On: 05/10/2021 18:39   DG Chest 1 View  Result Date: 05/15/2021 CLINICAL DATA:  60 year old female with altered mental status. EXAM: CHEST  1 VIEW COMPARISON:  Chest radiograph dated 05/10/2021. FINDINGS: There is cardiomegaly with vascular congestion. Bibasilar densities represent atelectasis or infiltrate. There is no pleural effusion pneumothorax. Atherosclerotic calcification of the aortic arch. No acute osseous pathology. IMPRESSION: 1. Cardiomegaly with vascular congestion. 2. Bibasilar atelectasis or infiltrate. Electronically Signed   By: Anner Crete M.D.   On: 05/15/2021 18:13   CT Head Wo Contrast  Result Date: 05/15/2021 CLINICAL DATA:  Mental status changes. Discharge 2 hours ago after hospitalization for altered mental status and urinary tract infection. EXAM: CT HEAD WITHOUT CONTRAST TECHNIQUE: Contiguous axial images were obtained from the base of the skull through the vertex without intravenous contrast. COMPARISON:  05/10/21 FINDINGS: Brain: No evidence  of acute infarction, hemorrhage, hydrocephalus, extra-axial collection or mass lesion/mass effect. There are patchy low-attenuation areas within the subcortical and periventricular white matter compatible with chronic microvascular disease. Vascular: No hyperdense vessel or unexpected calcification. Skull: Normal. Negative for fracture or focal lesion. Sinuses/Orbits: Air-fluid levels identified within the maxillary sinuses. Mild diffuse mucosal thickening with bubbly opacification of the sphenoid sinus and ethmoid air cells. Mastoid air cells are clear. Other: None IMPRESSION: 1. No acute intracranial abnormalities. 2. Chronic small vessel ischemic disease. 3. Acute on chronic sinus inflammation. Electronically Signed   By: Kerby Moors M.D.   On: 05/15/2021 19:46   CT Head Wo Contrast  Result Date: 05/10/2021 CLINICAL DATA:  Unresponsive prior to arrival. Question intracranial hemorrhage. EXAM: CT HEAD WITHOUT CONTRAST TECHNIQUE: Contiguous axial images were obtained from the base of the skull through the vertex without intravenous contrast. COMPARISON:  None. FINDINGS: Brain: Brainstem appears normal. No focal cerebellar finding. Cerebral hemispheres show moderate chronic small-vessel ischemic changes of the white matter. No sign of acute infarction, mass lesion, hemorrhage, hydrocephalus or extra-axial collection. Vascular: There is atherosclerotic calcification of the major vessels at the base of the brain. Skull: Negative Sinuses/Orbits: Clear/normal Other: None IMPRESSION: No acute finding by CT. Moderate chronic small-vessel ischemic changes of the cerebral hemispheric white matter. Electronically Signed   By: Nelson Chimes M.D.   On: 05/10/2021 13:56   DG Chest Port 1 View  Result Date: 05/10/2021 CLINICAL DATA:  Unresponsive with concern for drug overdose EXAM: PORTABLE CHEST 1 VIEW COMPARISON:  January 27, 2018 chest radiograph. Chest CT October 17, 2017 FINDINGS: There are areas of ill-defined  airspace opacity in right mid and lower lung regions. Lungs elsewhere are clear. Heart is upper normal in size, stable, with prominence of central pulmonary arteries, consistent with pulmonary arterial hypertension. No adenopathy appreciable. No bone lesions IMPRESSION: Areas of ill-defined opacity right mid and lower lung regions. Suspect early pneumonia or possible foci of aspiration. Lungs elsewhere clear. Heart upper normal in size with evidence of pulmonary arterial hypertension. Note that pulmonary arterial hypertension was present  on prior CT examination. Electronically Signed   By: Lowella Grip III M.D.   On: 05/10/2021 13:23   ECHOCARDIOGRAM COMPLETE  Result Date: 05/11/2021    ECHOCARDIOGRAM REPORT   Patient Name:   GLYNN FREAS Date of Exam: 05/11/2021 Medical Rec #:  [id number]      Height:       68.0 in Accession #:    [id number]     Weight:       207.0 lb Date of Birth:  1961-07-07      BSA:          2.074 m Patient Age:    23 years       BP:           110/69 mmHg Patient Gender: F              HR:           100 bpm. Exam Location:  [facility] Procedure: 2D Echo, Cardiac Doppler and Color Doppler Indications:    Elevated Troponin  History:        Patient has prior history of Echocardiogram examinations, most                 recent 10/17/2017. COPD; Signs/Symptoms:Altered Mental Status.                 Sepsis, elevated Troponin, schizophrenia, HIV, pneumonia.  Sonographer:    Dustin Flock RDCS Referring Phys: [place]  1. Left ventricular ejection fraction, by estimation, is 60 to 65%. The left ventricle has normal function. The left ventricle has no regional wall motion abnormalities. There is mild left ventricular hypertrophy. Left ventricular diastolic parameters are consistent with Grade I diastolic dysfunction (impaired relaxation).  2. Right ventricular systolic function is mildly reduced. The right ventricular size is moderately enlarged. There is normal  pulmonary artery systolic pressure.  3. The mitral valve is normal in structure. No evidence of mitral valve regurgitation. No evidence of mitral stenosis.  4. The aortic valve is tricuspid. Aortic valve regurgitation is not visualized. No aortic stenosis is present.  5. The inferior vena cava is normal in size with greater than 50% respiratory variability, suggesting right atrial pressure of 3 mmHg. FINDINGS  Left Ventricle: Left ventricular ejection fraction, by estimation, is 60 to 65%. The left ventricle has normal function. The left ventricle has no regional wall motion abnormalities. The left ventricular internal cavity size was normal in size. There is  mild left ventricular hypertrophy. Left ventricular diastolic parameters are consistent with Grade I diastolic dysfunction (impaired relaxation). Normal left ventricular filling pressure. Right Ventricle: The right ventricular size is moderately enlarged. Right vetricular wall thickness was not assessed. Right ventricular systolic function is mildly reduced. There is normal pulmonary artery systolic pressure. The tricuspid regurgitant velocity is 2.45 m/s, and with an assumed right atrial pressure of 3 mmHg, the estimated right ventricular systolic pressure is 54.2 mmHg. Left Atrium: Left atrial size was normal in size. Right Atrium: Right atrial size was normal in size. Pericardium: There is no evidence of pericardial effusion. Mitral Valve: The mitral valve is normal in structure. No evidence of mitral valve regurgitation. No evidence of mitral valve stenosis. Tricuspid Valve: The tricuspid valve is not well visualized. Tricuspid valve regurgitation is trivial. No evidence of tricuspid stenosis. Aortic Valve: The aortic valve is tricuspid. Aortic valve regurgitation is not visualized. No aortic stenosis is present. Aortic valve mean gradient measures 5.2 mmHg. Aortic valve peak gradient  measures 10.0 mmHg. Aortic valve area, by VTI measures 2.35  cm.  Pulmonic Valve: The pulmonic valve was not well visualized. Pulmonic valve regurgitation is not visualized. No evidence of pulmonic stenosis. Aorta: The aortic root is normal in size and structure. Pulmonary Artery: Indeterminant PASP, inadequate TR jet. Venous: The inferior vena cava is normal in size with greater than 50% respiratory variability, suggesting right atrial pressure of 3 mmHg. IAS/Shunts: No atrial level shunt detected by color flow Doppler.  LEFT VENTRICLE PLAX 2D LVIDd:         3.63 cm  Diastology LVIDs:         2.39 cm  LV e' medial:    5.66 cm/s LV PW:         1.16 cm  LV E/e' medial:  9.8 LV IVS:        1.13 cm  LV e' lateral:   10.00 cm/s LVOT diam:     2.00 cm  LV E/e' lateral: 5.5 LV SV:         57 LV SV Index:   27 LVOT Area:     3.14 cm  RIGHT VENTRICLE RV Basal diam:  3.96 cm RV S prime:     13.20 cm/s TAPSE (M-mode): 2.3 cm LEFT ATRIUM             Index       RIGHT ATRIUM           Index LA diam:        3.50 cm 1.69 cm/m  RA Area:     18.40 cm LA Vol (A2C):   55.9 ml 26.95 ml/m RA Volume:   51.60 ml  24.88 ml/m LA Vol (A4C):   42.8 ml 20.64 ml/m LA Biplane Vol: 51.7 ml 24.93 ml/m  AORTIC VALVE AV Area (Vmax):    2.12 cm AV Area (Vmean):   2.49 cm AV Area (VTI):     2.35 cm AV Vmax:           158.22 cm/s AV Vmean:          107.448 cm/s AV VTI:            0.242 m AV Peak Grad:      10.0 mmHg AV Mean Grad:      5.2 mmHg LVOT Vmax:         107.00 cm/s LVOT Vmean:        85.000 cm/s LVOT VTI:          0.181 m LVOT/AV VTI ratio: 0.75  AORTA Ao Root diam: 2.50 cm MITRAL VALVE               TRICUSPID VALVE MV Area (PHT): 5.46 cm    TR Peak grad:   24.0 mmHg MV Decel Time: 139 msec    TR Vmax:        245.00 cm/s MV E velocity: 55.20 cm/s MV A velocity: 83.70 cm/s  SHUNTS MV E/A ratio:  0.66        Systemic VTI:  0.18 m                            Systemic Diam: 2.00 cm Carlyle Dolly MD Electronically signed by Carlyle Dolly MD Signature Date/Time: 05/11/2021/5:26:32 PM    Final      SIGNED: Deatra James, MD, FHM. [facility],  Pager (please use [email or web address] to page/text) Please use Epic Secure Chat for non-urgent communication (  7AM-7PM)  If 7PM-7AM, please contact night-coverage [email or web address], 05/16/2021, 11:37 AM

## 2021-05-16 NOTE — Evaluation (Signed)
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 of post-op N/V   High cholesterol    HIV (human immunodeficiency virus infection) ([place])    Hypertension    Limp    Mild intellectual disability    No natural teeth    does not wear her dentures   Paranoid schizophrenia ([place])    Personality disorder ([place])    Schizophrenia ([place])    Past Surgical History:  Past Surgical History:  Procedure Laterality Date   BREAST LUMPECTOMY WITH NEEDLE LOCALIZATION Left 07/28/2015   Procedure: BREAST LUMPECTOMY WITH NEEDLE LOCALIZATION;  Surgeon: Autumn Messing III, MD;  Location: [facility];  Service: General;  Laterality: Left;   COLONOSCOPY WITH PROPOFOL N/A 06/13/2015   SLF: 1. the left colon is redundant 2. the examination was otherwise normal 3. small internal hemorrhoids 4. moderate sized external hemorrhoids   HEMORRHOID SURGERY     UMBILICAL HERNIA REPAIR     HPI:  Karina Clarke is a 60 y.o. female with medical history significant for schizophrenia, HIV, COPD who presents to the emergency department via EMS due to altered mental status.  Patient was unable to provide history, history was obtained from ED physician and ED medical record.  Per report, she was discharged on 6/21 due to acute metabolic encephalopathy and she was also treated for sepsis secondary to pneumonia and possible UTI, patient was discharged on Augmentin.  Apparently on returning home, she was initially responding  appropriately, but she was left in the room and when family member went back to check on her, she was noted to have slumped over with drool on foam coming out of her mouth.  It was reported that her mental status remain changed from her baseline whereby she was able to partake in conversation (without meaning). BSE was completed on 05/14/21 (during last admission) with no overt s/sx of aspiration noted and recommendation for D3/thin; ST signed off at that time secondary to no needs noted. BSE requested upon re-admission.   Assessment / Plan / Recommendation Clinical Impression  Clinical swallowing evaluation completed with Pt sitting upright in bed; She was very pleasant and talking (gibberish/nonsensical). Pt consumed thin liquids, puree textures and regular textures without overt s/sx of aspiration. Regular textures required additional time to masticate and liquid wash required for oral clearance. NOTE with thin liquids, Pt's consumption with the straw was very impulsive consuming many oz in a short period of time. Pt will require 1:1 support for meals to control rate and provide support to ensure strategy usage - alternate bites and sips and slow rate. After SLP left the room, RN called SLP to report and episode of emesis of mostly water briefly following BSE. Pt did do some belching throughout BSE and consumed a large amount of water during a short period of time. ? esophageal component, further note some wet vocal quality after episode of emesis. Recommend consider GI consult and note Pt is at risk for aspiration with episodes  of emesis or if Pt is being fed when not alert or during seizure activity. ST will f/u X1 to ensure diet tolerance. SLP Visit Diagnosis: Dysphagia, unspecified (R13.10)    Aspiration Risk  Mild aspiration risk    Diet Recommendation Thin liquid;Dysphagia 3 (Mech soft)   Liquid Administration via: Cup;Straw Medication Administration: Whole meds with liquid Supervision:  Intermittent supervision to cue for compensatory strategies;Staff to assist with self feeding Compensations: Minimize environmental distractions Postural Changes: Seated upright at 90 degrees    Other  Recommendations Recommended Consults: Consider GI evaluation Oral Care Recommendations: Oral care BID   Follow up Recommendations 24 hour supervision/assistance;Skilled Nursing facility      Frequency and Duration min 1 x/week  1 week       Prognosis Prognosis for Safe Diet Advancement: Good Barriers to Reach Goals: Cognitive deficits      Swallow Study   General HPI: Karina Clarke is a 60 y.o. female with medical history significant for schizophrenia, HIV, COPD who presents to the emergency department via EMS due to altered mental status.  Patient was unable to provide history, history was obtained from ED physician and ED medical record.  Per report, she was discharged on 6/21 due to acute metabolic encephalopathy and she was also treated for sepsis secondary to pneumonia and possible UTI, patient was discharged on Augmentin.  Apparently on returning home, she was initially responding appropriately, but she was left in the room and when family member went back to check on her, she was noted to have slumped over with drool on foam coming out of her mouth.  It was reported that her mental status remain changed from her baseline whereby she was able to partake in conversation (without meaning). BSE was completed on 05/14/21 (during last admission) with no overt s/sx of aspiration noted and recommendation for D3/thin; ST signed off at that time secondary to no needs noted. BSE requested upon re-admission. Type of Study: Bedside Swallow Evaluation Previous Swallow Assessment: BSE 6/20 Diet Prior to this Study: NPO Temperature Spikes Noted: No Respiratory Status: Room air History of Recent Intubation: No Behavior/Cognition: Alert;Cooperative;Pleasant mood Oral Cavity Assessment: Within  Functional Limits Oral Care Completed by SLP: Recent completion by staff Oral Cavity - Dentition: Edentulous Vision: Functional for self-feeding Self-Feeding Abilities: Able to feed self;Needs assist;Needs set up Patient Positioning: Upright in bed Baseline Vocal Quality: Normal Volitional Cough: Strong Volitional Swallow: Able to elicit    Oral/Motor/Sensory Function Overall Oral Motor/Sensory Function: Within functional limits   Ice Chips Ice chips: Within functional limits   Thin Liquid Thin Liquid: Within functional limits    Nectar Thick Nectar Thick Liquid: Not tested   Honey Thick Honey Thick Liquid: Not tested   Puree Puree: Within functional limits   Solid     Solid: Within functional limits     Dorin Stooksbury H. Roddie Mc, CCC-SLP Speech Language Pathologist  Wende Bushy 05/16/2021,5:01 PM

## 2021-05-17 ENCOUNTER — Inpatient Hospital Stay (HOSPITAL_COMMUNITY): Payer: Medicaid Other

## 2021-05-17 LAB — GLUCOSE, CAPILLARY
Glucose-Capillary: 104 mg/dL — ABNORMAL HIGH (ref 70–99)
Glucose-Capillary: 112 mg/dL — ABNORMAL HIGH (ref 70–99)
Glucose-Capillary: 113 mg/dL — ABNORMAL HIGH (ref 70–99)
Glucose-Capillary: 116 mg/dL — ABNORMAL HIGH (ref 70–99)
Glucose-Capillary: 118 mg/dL — ABNORMAL HIGH (ref 70–99)
Glucose-Capillary: 131 mg/dL — ABNORMAL HIGH (ref 70–99)
Glucose-Capillary: 70 mg/dL (ref 70–99)

## 2021-05-17 LAB — CBC
HCT: 34 % — ABNORMAL LOW (ref 36.0–46.0)
Hemoglobin: 11.2 g/dL — ABNORMAL LOW (ref 12.0–15.0)
MCH: 32.1 pg (ref 26.0–34.0)
MCHC: 32.9 g/dL (ref 30.0–36.0)
MCV: 97.4 fL (ref 80.0–100.0)
Platelets: 228 10*3/uL (ref 150–400)
RBC: 3.49 MIL/uL — ABNORMAL LOW (ref 3.87–5.11)
RDW: 15.4 % (ref 11.5–15.5)
WBC: 10.2 10*3/uL (ref 4.0–10.5)
nRBC: 0 % (ref 0.0–0.2)

## 2021-05-17 LAB — BASIC METABOLIC PANEL
Anion gap: 7 (ref 5–15)
BUN: 14 mg/dL (ref 6–20)
CO2: 25 mmol/L (ref 22–32)
Calcium: 8.7 mg/dL — ABNORMAL LOW (ref 8.9–10.3)
Chloride: 108 mmol/L (ref 98–111)
Creatinine, Ser: 0.66 mg/dL (ref 0.44–1.00)
GFR, Estimated: >60 mL/min (ref >60)
Glucose, Bld: 101 mg/dL — ABNORMAL HIGH (ref 70–99)
Potassium: 3.6 mmol/L (ref 3.5–5.1)
Sodium: 140 mmol/L (ref 135–145)

## 2021-05-17 LAB — LACTIC ACID, PLASMA: Lactic Acid, Venous: 1.1 mmol/L (ref 0.5–1.9)

## 2021-05-17 LAB — PROCALCITONIN: Procalcitonin: 0.19 ng/mL

## 2021-05-17 MED ORDER — SUCRALFATE 1 GM/10ML PO SUSP
1.0000 g | Freq: Three times a day (TID) | ORAL | Status: DC
  Administered 2021-05-17 – 2021-05-19 (×5): 1 g via ORAL
  Filled 2021-05-17 (×5): qty 10

## 2021-05-17 MED ORDER — DIVALPROEX SODIUM 125 MG PO CSDR
500.0000 mg | DELAYED_RELEASE_CAPSULE | ORAL | Status: DC
  Administered 2021-05-17 – 2021-05-21 (×10): 500 mg via ORAL
  Filled 2021-05-17 (×10): qty 4

## 2021-05-17 MED ORDER — GUAIFENESIN-DM 100-10 MG/5ML PO SYRP
10.0000 mL | ORAL_SOLUTION | Freq: Two times a day (BID) | ORAL | Status: DC
  Administered 2021-05-17 – 2021-05-22 (×10): 10 mL via ORAL
  Filled 2021-05-17 (×12): qty 10

## 2021-05-17 MED ORDER — FLORANEX PO PACK
1.0000 g | PACK | Freq: Three times a day (TID) | ORAL | Status: DC
  Administered 2021-05-17 – 2021-05-22 (×14): 1 g via ORAL
  Filled 2021-05-17 (×19): qty 1

## 2021-05-17 MED ORDER — IOHEXOL 9 MG/ML PO SOLN
ORAL | Status: AC
  Administered 2021-05-17: 500 mL
  Filled 2021-05-17: qty 500

## 2021-05-17 MED ORDER — LEVOFLOXACIN 750 MG PO TABS: 750.0000 mg | ORAL_TABLET | Freq: Every day | ORAL | 0 refills | Status: AC

## 2021-05-17 MED ORDER — SODIUM CHLORIDE 0.9 % IV SOLN: INTRAVENOUS | Status: AC

## 2021-05-17 MED ORDER — HYDROMORPHONE HCL 1 MG/ML IJ SOLN
1.0000 mg | INTRAMUSCULAR | Status: DC | PRN
Start: 2021-05-17 — End: 2021-05-22
  Administered 2021-05-17 – 2021-05-21 (×8): 1 mg via INTRAVENOUS
  Filled 2021-05-17 (×8): qty 1

## 2021-05-17 MED ORDER — LEVOFLOXACIN 500 MG PO TABS
750.0000 mg | ORAL_TABLET | Freq: Every day | ORAL | Status: DC
  Administered 2021-05-17 – 2021-05-21 (×5): 750 mg via ORAL
  Filled 2021-05-17: qty 2
  Filled 2021-05-17: qty 1
  Filled 2021-05-17 (×3): qty 2
  Filled 2021-05-17: qty 1

## 2021-05-17 MED ORDER — ONDANSETRON HCL 4 MG/2ML IJ SOLN
4.0000 mg | Freq: Four times a day (QID) | INTRAMUSCULAR | Status: AC | PRN
  Administered 2021-05-20: 4 mg via INTRAVENOUS
  Filled 2021-05-17: qty 2

## 2021-05-17 NOTE — Progress Notes (Signed)
Attempted to feed patient for breakfast and lunch today. After one bite patient said "that's enough." Ensure was given and patient drank 90%. After drinking patient started dry heaving and coughing, appearing to almost vomit. Patient was asked "does your stomach hurt?" Patient responding "yes."

## 2021-05-17 NOTE — Progress Notes (Signed)
Patient yelling "I'm going to be sick" repetitively and screaming "me going to die." Patient then started yelling "my leg, my leg, my leg." MD notified. At bedside to see patient. Order for abd ct and x-ray of right hip. Also prn zofran ordered.

## 2021-05-17 NOTE — Progress Notes (Signed)
  Speech Language Pathology Treatment: Dysphagia  Patient Details Name: Karina Clarke MRN: [id number] DOB: 02-Sep-1961 Today's Date: 05/17/2021 Time: 2863-8177 SLP Time Calculation (min) (ACUTE ONLY): 12.05 min  Assessment / Plan / Recommendation Clinical Impression  Ongoing diagnostic dysphagia therapy provided targeting trials of puree and an ensure provided by RN. Pt initially refused all PO stating no "don't want it", but when presented with puree and ensure by straw she took all bolus/presentations without complaint and with no overt s/sx of aspiration -- however ~ 3 minutes following consumption note dry heaving/gaging and coughing. Again, question esophageal component and recommend consider GI consult. ST will continue to follow, thank you    HPI HPI: Karina Clarke is a 60 y.o. female with medical history significant for schizophrenia, HIV, COPD who presents to the emergency department via EMS due to altered mental status.  Patient was unable to provide history, history was obtained from ED physician and ED medical record.  Per report, she was discharged on 6/21 due to acute metabolic encephalopathy and she was also treated for sepsis secondary to pneumonia and possible UTI, patient was discharged on Augmentin.  Apparently on returning home, she was initially responding appropriately, but she was left in the room and when family member went back to check on her, she was noted to have slumped over with drool on foam coming out of her mouth.  It was reported that her mental status remain changed from her baseline whereby she was able to partake in conversation (without meaning). BSE was completed on 05/14/21 (during last admission) with no overt s/sx of aspiration noted and recommendation for D3/thin; ST signed off at that time secondary to no needs noted. BSE requested upon re-admission.      SLP Plan  Continue with current plan of care       Recommendations  Diet recommendations:  Dysphagia 3 (mechanical soft);Thin liquid Liquids provided via: Cup;Straw Medication Administration: Whole meds with liquid Supervision: Full supervision/cueing for compensatory strategies Compensations: Minimize environmental distractions Postural Changes and/or Swallow Maneuvers: Seated upright 90 degrees;Upright 30-60 min after meal                Oral Care Recommendations: Oral care BID Follow up Recommendations: 24 hour supervision/assistance;Skilled Nursing facility SLP Visit Diagnosis: Dysphagia, unspecified (R13.10) Plan: Continue with current plan of care       Karina Clarke H. Roddie Mc, CCC-SLP Speech Language Pathologist    Wende Bushy 05/17/2021, 3:08 PM

## 2021-05-17 NOTE — Progress Notes (Signed)
PROGRESS NOTE    Patient: Karina Clarke                            PCP: Rosita Fire, MD                    DOB: Oct 16, 1961            DOA: 05/15/2021 UQJ:[id number]             DOS: 05/17/2021, 1:07 PM   LOS: 2 days   Date of Service: The patient was seen and examined on 05/17/2021  Subjective:   The patient was seen and examined this morning, hemodynamically stable Nursing staff reporting mentation is back to baseline, responding Yes or No to questions, able to eat with assist Otherwise speech is garbled does not make sense, very poor insight --- this seems to be her baseline.  Brief Narrative:   Karina Clarke is a 60 y.o. female with medical history significant for schizophrenia, HIV, COPD.  Patient was brought from nursing home with reports of unresponsiveness, with possible overdose.  She was given some Narcan prior to arrival as well as in the ED with some improvement in responsiveness.  She has been admitted with acute metabolic encephalopathy likely multifactorial in the setting of pneumonia, azotemia/AKI, and elevated liver enzymes.  GI consulted to assist with evaluation of liver injury with labs showing signs of improvement   Assessment & Plan:   Active Problems:   HIV (human immunodeficiency virus infection) ([place])   Hospital-acquired pneumonia   Acute respiratory failure with hypoxia (HCC)   Diabetes mellitus, new onset ([place])   Chronic paranoid schizophrenia ([place])   Transaminitis   Hypomagnesemia   Hypoalbuminemia   Essential hypertension    Acute metabolic encephalopathy-multifactorial; appears to be back to baseline Stable - -Per nursing staff mentation back to baseline able to respond to yes or no question, will be able to eat with assist only (awake garbled speech does not make sense poor insight) -Likely related to metabolic derangements that are also showing signs of improvement -Currently on full liquid diet, will be on dysphagia 3 -Head CT without acute  findings -Continue to treat AKI as well as sepsis -UDS only positive for benzodiazepines with Xanax prescription outpatient -PT evaluation >>> recommending SNF   AKI-improving -Baseline 1-1.3 >> 0.64 >> 0.66  -Continue on half-normal saline due to hypernatremia >>>  -Rechecking labs -Monitor urine output   Severe sepsis secondary to pneumonia and also possible UTI -Continue IV Unasyn and discontinue further azithromycin -Urine cultures >>  no growth -Blood cultures >>> NGTD   Transaminitis - improving -Possibly due to drug-induced liver injury versus ischemia -Appreciate ongoing GI evaluation -Continue to follow LFTs AST/ALT/ ... Trending down -stable    Hypernatremia-improving -Changed IV fluid to LR, following LABs  -Na 147, 149, 146, >>>140   Elevated troponin -2D echocardiogram 60-65% EF with grade 1 diastolic dysfunction   Thrombocytopenia- Stable  -Likely related to sepsis -Continue to monitor trend -Avoid heparin agents   HIV -HIV viral load not detected 12/2020 with stable CD4 count -Hold medications for now   Schizophrenia -Resume Haldol, Depakote, and Seroquel -Xanax may be resumed upon discharge   Hypertension-stable   Type 2 diabetes-with hypoglycemia -Hemoglobin A1c 6.0% -Continue - D5 IV fluid    Family Communication: None at bedside, called sister 6/17, tried calling 6/20 with no response  ---------------------------------------------------------------------------------------------------------------------------------------------------- Cultures; Blood Cultures x 2 >> NGT Urine  Culture  >>> NGT  Sputum Culture >> NGT   Antimicrobials:    Consultants: None   ------------------------------------------------------------------------------------------------------------------------------------------------  DVT prophylaxis:  SCD/Compression stockings Code Status:   Code Status: Full Code  Family Communication: No family member present at  bedside- attempt will be made to update daily The above findings and plan of care has been discussed with patient (and family)  in detail,  they expressed understanding and agreement of above. -Advance care planning has been discussed.   Admission status:   Status is: Inpatient  Remains inpatient appropriate because:Inpatient level of care appropriate due to severity of illness  Dispo: The patient is from: SNF               Anticipated d/c is to: SNF in 1 day               Patient currently is not medically stable to d/c.   Difficult to place patient No      Level of care: Med-Surg   Procedures:   No admission procedures for hospital encounter.    Antimicrobials:  Anti-infectives (From admission, onward)    Start     Dose/Rate Route Frequency Ordered Stop   05/17/21 1000  levofloxacin (LEVAQUIN) tablet 750 mg        750 mg Oral Daily 05/17/21 0813     05/15/21 2130  azithromycin (ZITHROMAX) 500 mg in sodium chloride 0.9 % 250 mL IVPB  Status:  Discontinued        500 mg 250 mL/hr over 60 Minutes Intravenous Every 24 hours 05/15/21 2127 05/17/21 0813   05/15/21 2100  cefTRIAXone (ROCEPHIN) 1 g in sodium chloride 0.9 % 100 mL IVPB  Status:  Discontinued        1 g 200 mL/hr over 30 Minutes Intravenous Every 24 hours 05/15/21 2053 05/17/21 0813        Medication:   ALPRAZolam  0.25 mg Oral TID   atorvastatin  10 mg Oral Daily   Chlorhexidine Gluconate Cloth  6 each Topical Daily   divalproex  500 mg Oral 2 times per day   enoxaparin (LOVENOX) injection  40 mg Subcutaneous Q24H   feeding supplement  237 mL Oral BID BM   guaiFENesin-dextromethorphan  10 mL Oral BID   haloperidol  10 mg Oral TID   insulin aspart  0-6 Units Subcutaneous Q4H   insulin detemir  20 Units Subcutaneous QHS   lactobacillus  1 g Oral TID WC   levofloxacin  750 mg Oral Daily   magnesium oxide  400 mg Oral q morning   metoprolol tartrate  25 mg Oral BID WC   sucralfate  1 g Oral TID WC    trihexyphenidyl  2 mg Oral BID WC       Objective:   Vitals:   05/16/21 1500 05/16/21 2000 05/17/21 0500 05/17/21 1147  BP: (!) 157/79  (!) 148/85 126/69  Pulse: 80   92  Resp: 20   (!) 22  Temp: 99.1 F (37.3 C) 97.9 F (36.6 C) 98.9 F (37.2 C) 97.9 F (36.6 C)  TempSrc: Oral Oral Oral Oral  SpO2: 100%  100%   Weight:      Height:        Intake/Output Summary (Last 24 hours) at 05/17/2021 1307 Last data filed at 05/17/2021 0600 Gross per 24 hour  Intake 490 ml  Output 950 ml  Net -460 ml   Filed Weights   05/15/21 1724 05/16/21 0946  Weight: 98.3  kg 98.6 kg     Examination:      Physical Exam:   General:  Awake can respond with yes or no, otherwise speech is garbled does not make sense  HEENT:  Normocephalic, PERRL, otherwise with in Normal limits   Neuro:  CNII-XII intact. , normal motor and sensation, reflexes intact   Lungs:   Clear to auscultation BL, Respirations unlabored, no wheezes / crackles  Cardio:    S1/S2, RRR, No murmure, No Rubs or Gallops   Abdomen:   Soft, non-tender, bowel sounds active all four quadrants,  no guarding or peritoneal signs.  Muscular skeletal:  Bedbound-Limited exam - in bed, able to move all 4 extremities, 2+ pulses,  symmetric, No pitting edema  Skin:  Dry, warm to touch, negative for any Rashes,  Wounds: Please see nursing documentation          ------------------------------------------------------------------------------------------------------------------------------------------    LABs:  CBC Latest Ref Rng & Units 05/17/2021 05/16/2021 05/15/2021  WBC 4.0 - 10.5 K/uL 10.2 6.0 7.2  Hemoglobin 12.0 - 15.0 g/dL 11.2(L) 10.9(L) 11.0(L)  Hematocrit 36.0 - 46.0 % 34.0(L) 33.7(L) 34.3(L)  Platelets 150 - 400 K/uL 228 176 166   CMP Latest Ref Rng & Units 05/17/2021 05/16/2021 05/15/2021  Glucose 70 - 99 mg/dL 101(H) 92 98  BUN 6 - 20 mg/dL 14 14 16   Creatinine 0.44 - 1.00 mg/dL 0.66 0.64 0.78  Sodium 135 - 145 mmol/L  140 143 141  Potassium 3.5 - 5.1 mmol/L 3.6 3.8 3.9  Chloride 98 - 111 mmol/L 108 111 109  CO2 22 - 32 mmol/L 25 26 27   Calcium 8.9 - 10.3 mg/dL 8.7(L) 8.6(L) 8.2(L)  Total Protein 6.5 - 8.1 g/dL - 5.6(L) 5.7(L)  Total Bilirubin 0.3 - 1.2 mg/dL - 0.5 0.4  Alkaline Phos 38 - 126 U/L - 53 55  AST 15 - 41 U/L - 151(H) 163(H)  ALT 0 - 44 U/L - 108(H) 112(H)       Micro Results Recent Results (from the past 240 hour(s))  Blood culture (routine single)     Status: None   Collection Time: 05/10/21 12:31 PM   Specimen: BLOOD  Result Value Ref Range Status   Specimen Description BLOOD LEFT ANTECUBITAL  Final   Special Requests   Final    BOTTLES DRAWN AEROBIC AND ANAEROBIC Blood Culture adequate volume   Culture   Final    NO GROWTH 5 DAYS Performed at [facility], [address], [place], [place] [address]    Report Status 05/15/2021 FINAL  Final  Urine culture     Status: None   Collection Time: 05/10/21 12:31 PM   Specimen: In/Out Cath Urine  Result Value Ref Range Status   Specimen Description   Final    IN/OUT CATH URINE Performed at [facility], [address] [place], [place] [address]    Special Requests   Final    NONE Performed at [facility], [address], [place], [place] [address]    Culture   Final    NO GROWTH Performed at [facility], [place] [address], [place], [place] [address]    Report Status 05/12/2021 FINAL  Final  Resp Panel by RT-PCR (Flu A&B, Covid) Nasopharyngeal Swab     Status: None   Collection Time: 05/10/21  2:58 PM   Specimen: Nasopharyngeal Swab; Nasopharyngeal(NP) swabs in vial transport medium  Result Value Ref Range Status   SARS Coronavirus 2 by RT PCR NEGATIVE NEGATIVE Final  Comment: (NOTE) SARS-CoV-2 target nucleic acids are NOT DETECTED.  The SARS-CoV-2 RNA is generally detectable in upper respiratory specimens during the acute phase of infection. The lowest concentration of SARS-CoV-2 viral copies this assay can detect  is 138 copies/mL. A negative result does not preclude SARS-Cov-2 infection and should not be used as the sole basis for treatment or other patient management decisions. A negative result may occur with  improper specimen collection/handling, submission of specimen other than nasopharyngeal swab, presence of viral mutation(s) within the areas targeted by this assay, and inadequate number of viral copies(<138 copies/mL). A negative result must be combined with clinical observations, patient history, and epidemiological information. The expected result is Negative.  Fact Sheet for Patients:  EntrepreneurPulse.com.au  Fact Sheet for Healthcare Providers:  IncredibleEmployment.be  This test is no t yet approved or cleared by the [place] FDA and  has been authorized for detection and/or diagnosis of SARS-CoV-2 by FDA under an Emergency Use Authorization (EUA). This EUA will remain  in effect (meaning this test can be used) for the duration of the COVID-19 declaration under Section 564(b)(1) of the Act, 21 U.S.C.section 360bbb-3(b)(1), unless the authorization is terminated  or revoked sooner.       Influenza A by PCR NEGATIVE NEGATIVE Final   Influenza B by PCR NEGATIVE NEGATIVE Final    Comment: (NOTE) The Xpert Xpress SARS-CoV-2/FLU/RSV plus assay is intended as an aid in the diagnosis of influenza from Nasopharyngeal swab specimens and should not be used as a sole basis for treatment. Nasal washings and aspirates are unacceptable for Xpert Xpress SARS-CoV-2/FLU/RSV testing.  Fact Sheet for Patients: EntrepreneurPulse.com.au  Fact Sheet for Healthcare Providers: IncredibleEmployment.be  This test is not yet approved or cleared by the [place] FDA and has been authorized for detection and/or diagnosis of SARS-CoV-2 by FDA under an Emergency Use Authorization (EUA). This EUA will remain in effect  (meaning this test can be used) for the duration of the COVID-19 declaration under Section 564(b)(1) of the Act, 21 U.S.C. section 360bbb-3(b)(1), unless the authorization is terminated or revoked.  Performed at [facility], [address], [place], [place] [address]   MRSA Next Gen by PCR, Nasal     Status: None   Collection Time: 05/11/21 12:45 PM   Specimen: Nasal Mucosa; Nasal Swab  Result Value Ref Range Status   MRSA by PCR Next Gen NOT DETECTED NOT DETECTED Final    Comment: (NOTE) The GeneXpert MRSA Assay (FDA approved for NASAL specimens only), is one component of a comprehensive MRSA colonization surveillance program. It is not intended to diagnose MRSA infection nor to guide or monitor treatment for MRSA infections. Test performance is not FDA approved in patients less than 30 years old. Performed at [facility], [address], [place], [place] [address]   SARS CORONAVIRUS 2 (TAT 6-24 HRS) Nasopharyngeal Nasopharyngeal Swab     Status: None   Collection Time: 05/15/21  8:50 PM   Specimen: Nasopharyngeal Swab  Result Value Ref Range Status   SARS Coronavirus 2 NEGATIVE NEGATIVE Final    Comment: (NOTE) SARS-CoV-2 target nucleic acids are NOT DETECTED.  The SARS-CoV-2 RNA is generally detectable in upper and lower respiratory specimens during the acute phase of infection. Negative results do not preclude SARS-CoV-2 infection, do not rule out co-infections with other pathogens, and should not be used as the sole basis for treatment or other patient management decisions. Negative results must be combined with clinical observations, patient history, and epidemiological information.  The expected result is Negative.  Fact Sheet for Patients: SugarRoll.be  Fact Sheet for Healthcare Providers: https://www.woods-mathews.com/  This test is not yet approved or cleared by the [place] FDA and  has been authorized for detection  and/or diagnosis of SARS-CoV-2 by FDA under an Emergency Use Authorization (EUA). This EUA will remain  in effect (meaning this test can be used) for the duration of the COVID-19 declaration under Se ction 564(b)(1) of the Act, 21 U.S.C. section 360bbb-3(b)(1), unless the authorization is terminated or revoked sooner.  Performed at [facility], [place] [address] [place], [place] [address]     Radiology Reports CT ABDOMEN PELVIS WO CONTRAST  Result Date: 05/10/2021 CLINICAL DATA:  Lethargy. Abdominal pain. Elevated liver enzymes. Acute kidney injury. Altered mental status. History of umbilical hernia. EXAM: CT ABDOMEN AND PELVIS WITHOUT CONTRAST TECHNIQUE: Multidetector CT imaging of the abdomen and pelvis was performed following the standard protocol without IV contrast. COMPARISON:  None. FINDINGS: Lower chest: Hypoventilatory changes at the dependent lung bases. Coronary atherosclerosis. Hepatobiliary: Normal liver size. No liver mass. Normal gallbladder with no radiopaque cholelithiasis. No biliary ductal dilatation. Pancreas: Normal, with no mass or duct dilation. Spleen: Normal size. No mass. Adrenals/Urinary Tract: Normal adrenals. No renal stones. No hydronephrosis. No contour deforming renal masses. Bladder collapsed by indwelling Foley catheter and grossly normal. Stomach/Bowel: Normal non-distended stomach. Normal caliber small bowel with no small bowel wall thickening. Normal appendix. Normal large bowel with no diverticulosis, large bowel wall thickening or pericolonic fat stranding. Vascular/Lymphatic: Atherosclerotic nonaneurysmal abdominal aorta. No pathologically enlarged lymph nodes in the abdomen or pelvis. Reproductive: Grossly normal uterus.  No adnexal mass. Other: No pneumoperitoneum. Trace free fluid in the pelvic cul-de-sac. No focal fluid collections. No evidence of a ventral abdominal hernia. Musculoskeletal: No aggressive appearing focal osseous lesions. IMPRESSION: 1.  No acute abnormality. No hydronephrosis. No evidence of bowel obstruction or acute bowel inflammation. Normal appendix. 2. Trace free fluid in the pelvic cul-de-sac, nonspecific. No adnexal masses. 3. Coronary atherosclerosis. 4. Aortic Atherosclerosis (ICD10-I70.0). Electronically Signed   By: Ilona Sorrel M.D.   On: 05/10/2021 18:39   DG Chest 1 View  Result Date: 05/15/2021 CLINICAL DATA:  60 year old female with altered mental status. EXAM: CHEST  1 VIEW COMPARISON:  Chest radiograph dated 05/10/2021. FINDINGS: There is cardiomegaly with vascular congestion. Bibasilar densities represent atelectasis or infiltrate. There is no pleural effusion pneumothorax. Atherosclerotic calcification of the aortic arch. No acute osseous pathology. IMPRESSION: 1. Cardiomegaly with vascular congestion. 2. Bibasilar atelectasis or infiltrate. Electronically Signed   By: Anner Crete M.D.   On: 05/15/2021 18:13   CT Head Wo Contrast  Result Date: 05/15/2021 CLINICAL DATA:  Mental status changes. Discharge 2 hours ago after hospitalization for altered mental status and urinary tract infection. EXAM: CT HEAD WITHOUT CONTRAST TECHNIQUE: Contiguous axial images were obtained from the base of the skull through the vertex without intravenous contrast. COMPARISON:  05/10/21 FINDINGS: Brain: No evidence of acute infarction, hemorrhage, hydrocephalus, extra-axial collection or mass lesion/mass effect. There are patchy low-attenuation areas within the subcortical and periventricular white matter compatible with chronic microvascular disease. Vascular: No hyperdense vessel or unexpected calcification. Skull: Normal. Negative for fracture or focal lesion. Sinuses/Orbits: Air-fluid levels identified within the maxillary sinuses. Mild diffuse mucosal thickening with bubbly opacification of the sphenoid sinus and ethmoid air cells. Mastoid air cells are clear. Other: None IMPRESSION: 1. No acute intracranial abnormalities. 2. Chronic  small vessel ischemic disease. 3. Acute on chronic sinus  inflammation. Electronically Signed   By: Kerby Moors M.D.   On: 05/15/2021 19:46   CT Head Wo Contrast  Result Date: 05/10/2021 CLINICAL DATA:  Unresponsive prior to arrival. Question intracranial hemorrhage. EXAM: CT HEAD WITHOUT CONTRAST TECHNIQUE: Contiguous axial images were obtained from the base of the skull through the vertex without intravenous contrast. COMPARISON:  None. FINDINGS: Brain: Brainstem appears normal. No focal cerebellar finding. Cerebral hemispheres show moderate chronic small-vessel ischemic changes of the white matter. No sign of acute infarction, mass lesion, hemorrhage, hydrocephalus or extra-axial collection. Vascular: There is atherosclerotic calcification of the major vessels at the base of the brain. Skull: Negative Sinuses/Orbits: Clear/normal Other: None IMPRESSION: No acute finding by CT. Moderate chronic small-vessel ischemic changes of the cerebral hemispheric white matter. Electronically Signed   By: Nelson Chimes M.D.   On: 05/10/2021 13:56   DG Chest Port 1 View  Result Date: 05/10/2021 CLINICAL DATA:  Unresponsive with concern for drug overdose EXAM: PORTABLE CHEST 1 VIEW COMPARISON:  January 27, 2018 chest radiograph. Chest CT October 17, 2017 FINDINGS: There are areas of ill-defined airspace opacity in right mid and lower lung regions. Lungs elsewhere are clear. Heart is upper normal in size, stable, with prominence of central pulmonary arteries, consistent with pulmonary arterial hypertension. No adenopathy appreciable. No bone lesions IMPRESSION: Areas of ill-defined opacity right mid and lower lung regions. Suspect early pneumonia or possible foci of aspiration. Lungs elsewhere clear. Heart upper normal in size with evidence of pulmonary arterial hypertension. Note that pulmonary arterial hypertension was present on prior CT examination. Electronically Signed   By: Lowella Grip III M.D.   On:  05/10/2021 13:23   ECHOCARDIOGRAM COMPLETE  Result Date: 05/11/2021    ECHOCARDIOGRAM REPORT   Patient Name:   NADENE WITHERSPOON Date of Exam: 05/11/2021 Medical Rec #:  [id number]      Height:       68.0 in Accession #:    [id number]     Weight:       207.0 lb Date of Birth:  1961/05/03      BSA:          2.074 m Patient Age:    46 years       BP:           110/69 mmHg Patient Gender: F              HR:           100 bpm. Exam Location:  [facility] Procedure: 2D Echo, Cardiac Doppler and Color Doppler Indications:    Elevated Troponin  History:        Patient has prior history of Echocardiogram examinations, most                 recent 10/17/2017. COPD; Signs/Symptoms:Altered Mental Status.                 Sepsis, elevated Troponin, schizophrenia, HIV, pneumonia.  Sonographer:    Dustin Flock RDCS Referring Phys: [place]  1. Left ventricular ejection fraction, by estimation, is 60 to 65%. The left ventricle has normal function. The left ventricle has no regional wall motion abnormalities. There is mild left ventricular hypertrophy. Left ventricular diastolic parameters are consistent with Grade I diastolic dysfunction (impaired relaxation).  2. Right ventricular systolic function is mildly reduced. The right ventricular size is moderately enlarged. There is normal pulmonary artery systolic pressure.  3. The mitral valve is normal in structure. No  evidence of mitral valve regurgitation. No evidence of mitral stenosis.  4. The aortic valve is tricuspid. Aortic valve regurgitation is not visualized. No aortic stenosis is present.  5. The inferior vena cava is normal in size with greater than 50% respiratory variability, suggesting right atrial pressure of 3 mmHg. FINDINGS  Left Ventricle: Left ventricular ejection fraction, by estimation, is 60 to 65%. The left ventricle has normal function. The left ventricle has no regional wall motion abnormalities. The left ventricular internal  cavity size was normal in size. There is  mild left ventricular hypertrophy. Left ventricular diastolic parameters are consistent with Grade I diastolic dysfunction (impaired relaxation). Normal left ventricular filling pressure. Right Ventricle: The right ventricular size is moderately enlarged. Right vetricular wall thickness was not assessed. Right ventricular systolic function is mildly reduced. There is normal pulmonary artery systolic pressure. The tricuspid regurgitant velocity is 2.45 m/s, and with an assumed right atrial pressure of 3 mmHg, the estimated right ventricular systolic pressure is 81.8 mmHg. Left Atrium: Left atrial size was normal in size. Right Atrium: Right atrial size was normal in size. Pericardium: There is no evidence of pericardial effusion. Mitral Valve: The mitral valve is normal in structure. No evidence of mitral valve regurgitation. No evidence of mitral valve stenosis. Tricuspid Valve: The tricuspid valve is not well visualized. Tricuspid valve regurgitation is trivial. No evidence of tricuspid stenosis. Aortic Valve: The aortic valve is tricuspid. Aortic valve regurgitation is not visualized. No aortic stenosis is present. Aortic valve mean gradient measures 5.2 mmHg. Aortic valve peak gradient measures 10.0 mmHg. Aortic valve area, by VTI measures 2.35  cm. Pulmonic Valve: The pulmonic valve was not well visualized. Pulmonic valve regurgitation is not visualized. No evidence of pulmonic stenosis. Aorta: The aortic root is normal in size and structure. Pulmonary Artery: Indeterminant PASP, inadequate TR jet. Venous: The inferior vena cava is normal in size with greater than 50% respiratory variability, suggesting right atrial pressure of 3 mmHg. IAS/Shunts: No atrial level shunt detected by color flow Doppler.  LEFT VENTRICLE PLAX 2D LVIDd:         3.63 cm  Diastology LVIDs:         2.39 cm  LV e' medial:    5.66 cm/s LV PW:         1.16 cm  LV E/e' medial:  9.8 LV IVS:         1.13 cm  LV e' lateral:   10.00 cm/s LVOT diam:     2.00 cm  LV E/e' lateral: 5.5 LV SV:         57 LV SV Index:   27 LVOT Area:     3.14 cm  RIGHT VENTRICLE RV Basal diam:  3.96 cm RV S prime:     13.20 cm/s TAPSE (M-mode): 2.3 cm LEFT ATRIUM             Index       RIGHT ATRIUM           Index LA diam:        3.50 cm 1.69 cm/m  RA Area:     18.40 cm LA Vol (A2C):   55.9 ml 26.95 ml/m RA Volume:   51.60 ml  24.88 ml/m LA Vol (A4C):   42.8 ml 20.64 ml/m LA Biplane Vol: 51.7 ml 24.93 ml/m  AORTIC VALVE AV Area (Vmax):    2.12 cm AV Area (Vmean):   2.49 cm AV Area (VTI):     2.35  cm AV Vmax:           158.22 cm/s AV Vmean:          107.448 cm/s AV VTI:            0.242 m AV Peak Grad:      10.0 mmHg AV Mean Grad:      5.2 mmHg LVOT Vmax:         107.00 cm/s LVOT Vmean:        85.000 cm/s LVOT VTI:          0.181 m LVOT/AV VTI ratio: 0.75  AORTA Ao Root diam: 2.50 cm MITRAL VALVE               TRICUSPID VALVE MV Area (PHT): 5.46 cm    TR Peak grad:   24.0 mmHg MV Decel Time: 139 msec    TR Vmax:        245.00 cm/s MV E velocity: 55.20 cm/s MV A velocity: 83.70 cm/s  SHUNTS MV E/A ratio:  0.66        Systemic VTI:  0.18 m                            Systemic Diam: 2.00 cm Carlyle Dolly MD Electronically signed by Carlyle Dolly MD Signature Date/Time: 05/11/2021/5:26:32 PM    Final     SIGNED: Deatra James, MD, FHM. [facility],  Pager (please use [email or web address] to page/text) Please use Epic Secure Chat for non-urgent communication (7AM-7PM)  If 7PM-7AM, please contact night-coverage [email or web address], 05/17/2021, 1:07 PM

## 2021-05-17 NOTE — TOC Initial Note (Addendum)
Transition of Care [facility]) - Initial/Assessment Note    Patient Details  Name: Karina Clarke MRN: [id number] Date of Birth: Jul 13, 1961  [facility]) CM/SW Contact:    Boneta Lucks, RN Phone Number: 05/17/2021, 2:46 PM  Clinical Narrative:     Patient readmitted for alter mental status.  Patient is altered at baseline. TOC updated Anthony at [employer]  daily. His concern is her nutrition. "Is she eating?"  RN to document, states patient is asking for food. DC plan for patient to go back to [facility] in [place] tomorrow. MD asked to place new orders for HHPT. [facility] accepted the referral.       FL2 started will need meds including psy meds and fax to [facility] at discharge @[phone].         Expected Discharge Plan: [place] Barriers to Discharge: Continued Medical Work up   Patient Goals and CMS Choice Patient states their goals for this hospitalization and ongoing recovery are:: return to [facility] CMS Medicare.gov Compare Post Acute Care list provided to:: Patient Represenative (must comment) Choice offered to / list presented to : [place] / Guardian  Expected Discharge Plan and Services Expected Discharge Plan: [place]         Expected Discharge Date: 05/18/21                             Date HH Agency Contacted: 05/17/21 Time [place]: [id number] Representative spoke with at [place]: [place] Arrangements/Services   Lives with:: Facility Resident Patient language and need for interpreter reviewed:: Yes        Need for Family Participation in Patient Care: Yes (Comment) Care giver support system in place?: Yes (comment)   Criminal Activity/Legal Involvement Pertinent to Current Situation/Hospitalization: No - Comment as needed  Activities of Daily Living Home Assistive Devices/Equipment: [employer] fast streamer ADL Screening (condition at time of admission) Patient's  cognitive ability adequate to safely complete daily activities?: No Is the patient deaf or have difficulty hearing?: No Does the patient have difficulty seeing, even when wearing glasses/contacts?: No Does the patient have difficulty concentrating, remembering, or making decisions?: Yes Patient able to express need for assistance with ADLs?: No Does the patient have difficulty dressing or bathing?: Yes Independently performs ADLs?: No Communication: Needs assistance Is this a change from baseline?: Pre-admission baseline Dressing (OT): Dependent Is this a change from baseline?: Pre-admission baseline Grooming: Dependent Is this a change from baseline?: Pre-admission baseline Feeding: Dependent Is this a change from baseline?: Pre-admission baseline Bathing: Dependent Is this a change from baseline?: Pre-admission baseline Toileting: Dependent Is this a change from baseline?: Pre-admission baseline In/Out Bed: Dependent Is this a change from baseline?: Pre-admission baseline Walks in Home: Dependent Is this a change from baseline?: Pre-admission baseline Does the patient have difficulty walking or climbing stairs?: Yes Weakness of Legs: Both Weakness of Arms/Hands: Both  Permission Sought/Granted         Permission granted to share info w AGENCY: [facility]     Permission granted to share info w Contact Information: Elberta Fortis  Emotional Assessment         Alcohol / Substance Use: Not Applicable Psych Involvement: No (comment)  Admission diagnosis:  Hypoxia [R09.02] Acute respiratory failure with hypoxia ([place]) [J96.01] Altered mental status, unspecified altered mental status type [R41.82] AMS (altered mental status) [R41.82] Patient Active Problem List  Diagnosis Date Noted   Hypomagnesemia 05/16/2021   Hypoalbuminemia 05/16/2021   Essential hypertension 05/16/2021   AMS (altered mental status) 05/10/2021   PNA (pneumonia) 05/10/2021   Transaminitis 05/10/2021    Diabetes mellitus, new onset ([place]) 10/24/2017   Hyperkalemia 10/24/2017   AKI (acute kidney injury) ([place]) 10/24/2017   COPD exacerbation ([place]) 10/21/2017   Hospital-acquired pneumonia 10/12/2017   Acute respiratory failure with hypoxia ([place]) 10/12/2017   AIDS ([place]) 10/12/2017   Influenza 12/26/2016   Sepsis ([place]) 12/26/2016   CAP (community acquired pneumonia) 12/26/2016   Tobacco use disorder 12/26/2016   HIV (human immunodeficiency virus infection) ([place]) 12/26/2016   Schizophrenia ([place]) 12/26/2016   Personality disorder ([place]) 08/22/2016   Encounter for screening colonoscopy 05/24/2015   Vaginal discharge 11/04/2013   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   Chronic paranoid schizophrenia ([place]) 10/19/2011   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   Thrombocytopenia, unspecified ([place]) 10/19/2011   PCP:  Rosita Fire, MD Pharmacy:   [facility] [place], [place] [address] [place] [address] [place] [address] Phone: [phone] Fax: [phone]  [facility] - [place], [address] [address] Phone: [phone] Fax: [phone]  [place], [address] [place] [address] Phone: [phone] Fax: [phone]     Social Determinants of Health ([place]) Interventions    Readmission Risk Interventions Readmission Risk Prevention Plan 05/17/2021 05/15/2021  Post Dischage Appt - Complete  Medication Screening - Complete  Transportation Screening Complete Complete  HRI or Home Care Consult Complete -  Social Work Consult for Recovery Care Planning/Counseling Complete -  Palliative Care Screening Not Applicable -  Medication Review (RN Care Manager) Complete -  Some recent data might be hidden

## 2021-05-18 DIAGNOSIS — R1310 Dysphagia, unspecified: Secondary | ICD-10-CM

## 2021-05-18 DIAGNOSIS — K3189 Other diseases of stomach and duodenum: Secondary | ICD-10-CM

## 2021-05-18 LAB — GLUCOSE, CAPILLARY
Glucose-Capillary: 100 mg/dL — ABNORMAL HIGH (ref 70–99)
Glucose-Capillary: 105 mg/dL — ABNORMAL HIGH (ref 70–99)
Glucose-Capillary: 142 mg/dL — ABNORMAL HIGH (ref 70–99)
Glucose-Capillary: 53 mg/dL — ABNORMAL LOW (ref 70–99)
Glucose-Capillary: 67 mg/dL — ABNORMAL LOW (ref 70–99)
Glucose-Capillary: 89 mg/dL (ref 70–99)
Glucose-Capillary: 93 mg/dL (ref 70–99)
Glucose-Capillary: 95 mg/dL (ref 70–99)

## 2021-05-18 LAB — HEPATIC FUNCTION PANEL
ALT: 108 U/L — ABNORMAL HIGH (ref 0–44)
AST: 105 U/L — ABNORMAL HIGH (ref 15–41)
Albumin: 2.5 g/dL — ABNORMAL LOW (ref 3.5–5.0)
Alkaline Phosphatase: 63 U/L (ref 38–126)
Bilirubin, Direct: 0.1 mg/dL (ref 0.0–0.2)
Indirect Bilirubin: 0.4 mg/dL (ref 0.3–0.9)
Total Bilirubin: 0.5 mg/dL (ref 0.3–1.2)
Total Protein: 6.1 g/dL — ABNORMAL LOW (ref 6.5–8.1)

## 2021-05-18 LAB — URINE CULTURE: Culture: NO GROWTH

## 2021-05-18 LAB — CBC
HCT: 33.7 % — ABNORMAL LOW (ref 36.0–46.0)
Hemoglobin: 10.8 g/dL — ABNORMAL LOW (ref 12.0–15.0)
MCH: 31.4 pg (ref 26.0–34.0)
MCHC: 32 g/dL (ref 30.0–36.0)
MCV: 98 fL (ref 80.0–100.0)
Platelets: 252 10*3/uL (ref 150–400)
RBC: 3.44 MIL/uL — ABNORMAL LOW (ref 3.87–5.11)
RDW: 15.4 % (ref 11.5–15.5)
WBC: 12.8 10*3/uL — ABNORMAL HIGH (ref 4.0–10.5)
nRBC: 0.2 % (ref 0.0–0.2)

## 2021-05-18 LAB — BASIC METABOLIC PANEL
Anion gap: 6 (ref 5–15)
BUN: 16 mg/dL (ref 6–20)
CO2: 27 mmol/L (ref 22–32)
Calcium: 8.7 mg/dL — ABNORMAL LOW (ref 8.9–10.3)
Chloride: 109 mmol/L (ref 98–111)
Creatinine, Ser: 0.64 mg/dL (ref 0.44–1.00)
GFR, Estimated: >60 mL/min (ref >60)
Glucose, Bld: 67 mg/dL — ABNORMAL LOW (ref 70–99)
Potassium: 3.7 mmol/L (ref 3.5–5.1)
Sodium: 142 mmol/L (ref 135–145)

## 2021-05-18 MED ORDER — DEXTROSE 50 % IV SOLN
INTRAVENOUS | Status: AC
  Administered 2021-05-18: 50 mL
  Filled 2021-05-18: qty 50

## 2021-05-18 MED ORDER — ABACAVIR SULFATE-LAMIVUDINE 600-300 MG PO TABS
1.0000 | ORAL_TABLET | Freq: Every day | ORAL | Status: DC
  Filled 2021-05-18 (×6): qty 1

## 2021-05-18 MED ORDER — ATAZANAVIR SULFATE 200 MG PO CAPS
400.0000 mg | ORAL_CAPSULE | Freq: Every day | ORAL | Status: DC
  Administered 2021-05-21: 400 mg via ORAL
  Filled 2021-05-18 (×6): qty 2

## 2021-05-18 MED ORDER — PANTOPRAZOLE SODIUM 40 MG IV SOLR
40.0000 mg | Freq: Two times a day (BID) | INTRAVENOUS | Status: DC
  Administered 2021-05-18: 40 mg via INTRAVENOUS
  Filled 2021-05-18: qty 40

## 2021-05-18 MED ORDER — ENOXAPARIN SODIUM 40 MG/0.4ML IJ SOSY
40.0000 mg | PREFILLED_SYRINGE | INTRAMUSCULAR | Status: DC
  Administered 2021-05-19 – 2021-05-20 (×2): 40 mg via SUBCUTANEOUS
  Filled 2021-05-18 (×2): qty 0.4

## 2021-05-18 MED ORDER — INSULIN DETEMIR 100 UNIT/ML ~~LOC~~ SOLN
10.0000 [IU] | Freq: Every day | SUBCUTANEOUS | Status: DC
  Administered 2021-05-18: 10 [IU] via SUBCUTANEOUS
  Filled 2021-05-18 (×3): qty 0.1

## 2021-05-18 NOTE — TOC Progression Note (Signed)
Transition of Care [facility]) - Progression Note    Patient Details  Name: Karina Clarke MRN: [id number] Date of Birth: [birth year]  Transition [facility]) CM/SW Contact  Natasha Bence, LCSW Phone Number: 05/18/2021, 4:40 PM  Clinical Narrative:    [name] with [facility] agreeable to take patient for weekend discharge if [facility] provided today with updated psych meds. CSW inquired to MD about current psych meds to ensure that they are updated and will be what patient will discharge with. CSW completed FL2 and faxed it to provided fax number [phone]. CSW attempted to call [name] to notify him of updated FL2. CSW received no answer. TOC to follow.    Expected Discharge Plan: [place] Barriers to Discharge: Continued Medical Work up  Expected Discharge Plan and Services Expected Discharge Plan: [place]         Expected Discharge Date: 05/18/21                             Date [place]: 05/17/21 Time [place]: [place] Representative spoke with at [place]: [place] ([place]) Interventions    Readmission Risk Interventions Readmission Risk Prevention Plan 05/17/2021 05/15/2021  Post Dischage Appt - Complete  Medication Screening - Complete  Transportation Screening Complete Complete  HRI or Home Care Consult Complete -  Social Work Consult for [place] Planning/Counseling Complete -  Palliative Care Screening Not Applicable -  Medication Review Press photographer) Complete -  Some recent data might be hidden

## 2021-05-18 NOTE — Consult Note (Signed)
Referring Provider: Dr Flossie Dibble Primary Care Physician:  Avon Gully, MD Primary Gastroenterologist:  Dr.Rourk  Date of Admission: 05/15/21 Date of Consultation: 05/18/21  Reason for Consultation:  Transaminitis and dysphagia/odynophagia  HPI:  Karina Clarke is a 60 y.o. year old female with history of schizophrenia, HIV and COPD. Patient brought in from nursing home for initial reports of unresponsiveness with suspected possible overdose on evening of 6/21 after recent discharge from hospital earlier that day. She received narcan PTA and in the ED with some improvement. She was admitted for acute metabolic encephalopathy in the setting of pneumonia, AKI and elevated LFTs, likely multifactorial. Patient also continues to have issues with dysphagia this admission, BSE was performed by SLP on 6/20 during previous admission as well as on 6/23, SLP queried esophageal component and recommended GI consult. GI consulted for evaluation of transaminitis and dysphagia.    Patient unable to provide much sufficient history due to history of cognitive impairment, However, she does endorse feeling as though food gets stuck in her upper chest near the sternal angle, mostly thicker, more coarse foods such as meats. She denies any abdominal pain, nausea or vomiting. She is unable to pinpoint when symptoms began, however, she reports that the dysphagia was present prior to this admission.   Transaminases continue to be elevated since previous hospital admission on 6/16, however, trending down with ALT 108 (272 on 6/16) and AST 151 (913 on 6/17). Alk Phos and total bilirubin continue to be WNL, INR 1.1. Hepatitis serologies non reactive and acetaminophen level unremarkable.   Patient subsequently began exhibiting signs of dysphagia again yesterday. Per nursing notes, patient had several issues of pain, dry heaving and coughing when eating. No episodes of emesis noted. Speech therapy was consulted for evaluation  of dysphagia yesterday. Patient was initially able to tolerate pureed food and ensure via straw, however, 3 minutes following consumption dry heaving, gagging and coughing was noted.   Patient currently on dysphagia 3 diet. In talking with patient's RN, it was reported that she was able to tolerate soft foods (eggs applesauce) and liquids at breakfast without issue.   Past Medical History:  Diagnosis Date   Atypical lobular hyperplasia of left breast 06/2015   Bloating    per sister   Chronic right hip pain    sister states circulation problem in hip   Family history of adverse reaction to anesthesia    sister has hx. of post-op N/V   High cholesterol    HIV (human immunodeficiency virus infection) (HCC)    Hypertension    Limp    Mild intellectual disability    No natural teeth    does not wear her dentures   Paranoid schizophrenia (HCC)    Personality disorder (HCC)    Schizophrenia (HCC)     Past Surgical History:  Procedure Laterality Date   BREAST LUMPECTOMY WITH NEEDLE LOCALIZATION Left 07/28/2015   Procedure: BREAST LUMPECTOMY WITH NEEDLE LOCALIZATION;  Surgeon: Chevis Pretty III, MD;  Location: [facility];  Service: General;  Laterality: Left;   COLONOSCOPY WITH PROPOFOL N/A 06/13/2015   SLF: 1. the left colon is redundant 2. the examination was otherwise normal 3. small internal hemorrhoids 4. moderate sized external hemorrhoids   HEMORRHOID SURGERY     UMBILICAL HERNIA REPAIR      Prior to Admission medications   Medication Sig Start Date End Date Taking? Authorizing Provider  abacavir-lamiVUDine (EPZICOM) 600-300 MG per tablet Take 1 tablet by mouth daily.  Yes [provider]  acetaminophen (TYLENOL) 650 MG CR tablet Take 650 mg by mouth every 8 (eight) hours as needed for pain.   Yes [provider]  albuterol (PROVENTIL HFA;VENTOLIN HFA) 108 (90 Base) MCG/ACT inhaler Inhale 1-2 puffs into the lungs 2 (two) times daily as needed for  wheezing or shortness of breath.   Yes [provider]  ALPRAZolam Duanne Moron) 0.5 MG tablet Take 0.5 mg by mouth 2 (two) times daily as needed for anxiety.   Yes [provider]  atazanavir (REYATAZ) 200 MG capsule Take 400 mg by mouth daily.    Yes [provider]  atorvastatin (LIPITOR) 10 MG tablet Take 10 mg by mouth daily.   Yes [provider]  divalproex (DEPAKOTE) 500 MG DR tablet Take 500 mg by mouth 2 (two) times daily. Take 1 tablet (500 mg) by mouth at 4pm and 8pm daily   Yes [provider]  haloperidol (HALDOL) 10 MG tablet Take 10 mg by mouth 3 (three) times daily.   Yes [provider]  haloperidol decanoate (HALDOL DECANOATE) 100 MG/ML injection Inject 200 mg into the muscle every 28 (twenty-eight) days.   Yes [provider]  hydrOXYzine (ATARAX/VISTARIL) 25 MG tablet Take 25 mg by mouth at bedtime.   Yes [provider]  insulin detemir (LEVEMIR) 100 UNIT/ML injection Inject 0.33 mLs (33 Units total) into the skin at bedtime. 10/27/17  Yes Isaac Bliss, Rayford Halsted, MD  Magnesium Oxide 400 MG CAPS Take 1 capsule (400 mg total) by mouth every morning. 08/10/17  Yes Reyne Dumas, MD  metoprolol tartrate (LOPRESSOR) 25 MG tablet Take 1 tablet by mouth 2 (two) times daily. 05/03/21  Yes [provider]  polyethylene glycol (MIRALAX / GLYCOLAX) packet Take 17 g by mouth daily as needed for mild constipation.    Yes [provider]  QUEtiapine (SEROQUEL) 200 MG tablet Take 100 mg by mouth 2 (two) times daily. Take 1 tablet (200 mg) by mouth with supper and at bedtime daily   Yes [provider]  raltegravir (ISENTRESS) 400 MG tablet Take 400 mg by mouth 2 (two) times daily.   Yes [provider]  trihexyphenidyl (ARTANE) 2 MG tablet Take 2 mg by mouth 2 (two) times daily with a meal.   Yes [provider]  valACYclovir (VALTREX) 1000 MG tablet Take 1,000 mg by mouth daily.     Yes [provider]  zolpidem (AMBIEN) 10 MG tablet Take 10 mg by mouth at bedtime as needed for sleep.   Yes [provider]  amoxicillin-clavulanate (AUGMENTIN) 875-125 MG tablet Take 1 tablet by mouth 2 (two) times daily for 3 days. 05/15/21 05/18/21  Manuella Ghazi, Pratik D, DO  levofloxacin (LEVAQUIN) 750 MG tablet Take 1 tablet (750 mg total) by mouth daily for 5 days. 05/18/21 05/23/21  Deatra James, MD    Current Facility-Administered Medications  Medication Dose Route Frequency Provider Last Rate Last Admin   abacavir-lamiVUDine (EPZICOM) 600-300 MG per tablet 1 tablet  1 tablet Oral Daily Shahmehdi, Seyed A, MD       ALPRAZolam Duanne Moron) tablet 0.25 mg  0.25 mg Oral TID Skipper Cliche A, MD   0.25 mg at 05/18/21 0901   atazanavir (REYATAZ) capsule 400 mg  400 mg Oral Q breakfast Shahmehdi, Seyed A, MD       atorvastatin (LIPITOR) tablet 10 mg  10 mg Oral Daily Shahmehdi, Seyed A, MD   10 mg at 05/18/21 0901  Chlorhexidine Gluconate Cloth 2 % PADS 6 each  6 each Topical Daily Emokpae, Courage, MD   6 each at 05/18/21 0901   divalproex (DEPAKOTE SPRINKLE) capsule 500 mg  500 mg Oral 2 times per day Ena Dawley, RPH   500 mg at 05/17/21 2230   enoxaparin (LOVENOX) injection 40 mg  40 mg Subcutaneous Q24H Adefeso, Oladapo, DO   40 mg at 05/17/21 2235   feeding supplement (ENSURE ENLIVE / ENSURE PLUS) liquid 237 mL  237 mL Oral BID BM Adefeso, Oladapo, DO   237 mL at 05/18/21 0901   guaiFENesin-dextromethorphan (ROBITUSSIN DM) 100-10 MG/5ML syrup 10 mL  10 mL Oral BID Shahmehdi, Seyed A, MD   10 mL at 05/18/21 0900   haloperidol (HALDOL) tablet 10 mg  10 mg Oral TID Skipper Cliche A, MD   10 mg at 05/18/21 0900   HYDROmorphone (DILAUDID) injection 1 mg  1 mg Intravenous Q3H PRN Skipper Cliche A, MD   1 mg at 05/18/21 0931   hydrOXYzine (ATARAX/VISTARIL) tablet 25 mg  25 mg Oral TID PRN Skipper Cliche A, MD   25 mg at 05/17/21 1442   insulin aspart (novoLOG) injection 0-6  Units  0-6 Units Subcutaneous Q4H Adefeso, Oladapo, DO   1 Units at 05/16/21 2020   insulin detemir (LEVEMIR) injection 10 Units  10 Units Subcutaneous QHS Shahmehdi, Seyed A, MD       lactobacillus (FLORANEX/LACTINEX) granules 1 g  1 g Oral TID WC Shahmehdi, Seyed A, MD   1 g at 05/18/21 0900   levofloxacin (LEVAQUIN) tablet 750 mg  750 mg Oral Daily Shahmehdi, Seyed A, MD   750 mg at 05/17/21 1008   magnesium oxide (MAG-OX) tablet 400 mg  400 mg Oral q morning Shahmehdi, Seyed A, MD   400 mg at 05/18/21 2035   metoprolol tartrate (LOPRESSOR) tablet 25 mg  25 mg Oral BID WC Shahmehdi, Seyed A, MD   25 mg at 05/18/21 0901   ondansetron (ZOFRAN) injection 4 mg  4 mg Intravenous Q6H PRN Shahmehdi, Seyed A, MD       sucralfate (CARAFATE) 1 GM/10ML suspension 1 g  1 g Oral TID WC Shahmehdi, Seyed A, MD   1 g at 05/18/21 0600   trihexyphenidyl (ARTANE) tablet 2 mg  2 mg Oral BID WC Shahmehdi, Seyed A, MD   2 mg at 05/18/21 0901    Allergies as of 05/15/2021 - Review Complete 05/15/2021  Allergen Reaction Noted   Asa [aspirin] Nausea And Vomiting 06/08/2015    Family History  Problem Relation Age of Onset   Anesthesia problems Sister        post-op N/V   Diabetes Father     Social History   Socioeconomic History   Marital status: Single    Spouse name: Not on file   Number of children: Not on file   Years of education: Not on file   Highest education level: Not on file  Occupational History   Occupation: disabled  Tobacco Use   Smoking status: Every Day    Packs/day: 1.00    Years: 18.00    Pack years: 18.00    Types: Cigarettes   Smokeless tobacco: Never  Substance and Sexual Activity   Alcohol use: No    Alcohol/week: 0.0 standard drinks   Drug use: No   Sexual activity: Never    Birth control/protection: Post-menopausal  Other Topics Concern   Not on file  Social History Narrative   Not  on file   Social Determinants of Health   Financial Resource Strain: Not on file   Food Insecurity: Not on file  Transportation Needs: Not on file  Physical Activity: Not on file  Stress: Not on file  Social Connections: Not on file  Intimate Partner Violence: Not on file    Review of Systems: Gen: Denies fever, chills, loss of appetite, change in weight or weight loss CV: Denies chest pain, heart palpitations, syncope, edema  Resp: Denies shortness of breath with rest, cough, wheezing GI: Denies vomiting blood, jaundice, and fecal incontinence. Endorses dysphagia/odynophagia. MS: Denies joint pain,swelling, cramping Derm: Denies rash, itching, dry skin Psych: Denies depression, anxiety,confusion, or memory loss Heme: Denies bruising, bleeding, and enlarged lymph nodes.  Physical Exam: Vital signs in last 24 hours: Temp:  [97.9 F (36.6 C)-98.5 F (36.9 C)] 98.5 F (36.9 C) (06/24 0500) Pulse Rate:  [66-92] 66 (06/24 0500) Resp:  [16-22] 20 (06/24 0500) BP: (126-150)/(69-76) 150/76 (06/24 0500) SpO2:  [94 %-100 %] 94 % (06/24 0500) Last BM Date: 05/17/21 General:   Alert,  Well-developed, well-nourished, pleasant and cooperative in NAD. Alert to person Mouth:  No deformity or lesions, edentulous. Lungs:  Clear throughout to auscultation.   No wheezes, crackles, or rhonchi. No acute distress. Heart:  Regular rate and rhythm; no murmurs, clicks, rubs,  or gallops. Abdomen:  Soft, nontender and nondistended. No masses, hepatosplenomegaly or hernias noted. Normal bowel sounds, without guarding, and without rebound.   Rectal:  Deferred until time of colonoscopy.   Msk:  Symmetrical without gross deformities. Normal posture. Extremities:  mild, non-pitting edema to upper and lower extremities.  Neurologic: No focal neurologic deficits. Disoriented. Skin:  Intact without significant lesions or rashes. Cervical Nodes:  No significant cervical adenopathy. Psych:  Alert and cooperative. Normal mood and affect.  Intake/Output from previous day: 06/23 0701 - 06/24  0700 In: 773.2 [P.O.:480; I.V.:293.2] Out: 400 [Urine:400] Intake/Output this shift: No intake/output data recorded.  Lab Results: Recent Labs    05/16/21 0422 05/17/21 0538 05/18/21 0416  WBC 6.0 10.2 12.8*  HGB 10.9* 11.2* 10.8*  HCT 33.7* 34.0* 33.7*  PLT 176 228 252   BMET Recent Labs    05/16/21 0422 05/17/21 0538 05/18/21 0416  NA 143 140 142  K 3.8 3.6 3.7  CL 111 108 109  CO2 $Re'26 25 27  'sSV$ GLUCOSE 92 101* 67*  BUN $Re'14 14 16  'WCc$ CREATININE 0.64 0.66 0.64  CALCIUM 8.6* 8.7* 8.7*   LFT Recent Labs    05/15/21 1913 05/16/21 0422  PROT 5.7* 5.6*  ALBUMIN 2.3* 2.3*  AST 163* 151*  ALT 112* 108*  ALKPHOS 55 53  BILITOT 0.4 0.5   PT/INR Recent Labs    05/16/21 0422  LABPROT 13.8  INR 1.1    Studies/Results: CT ABDOMEN WO CONTRAST  Result Date: 05/17/2021 CLINICAL DATA:  Abdominal pain, possible recent overdose EXAM: CT ABDOMEN WITHOUT CONTRAST TECHNIQUE: Multidetector CT imaging of the abdomen was performed following the standard protocol without IV contrast. COMPARISON:  05/10/2021 FINDINGS: Lower chest: Mild basilar atelectasis is seen although improved when compared with the prior exam. Hepatobiliary: No focal liver abnormality is seen. No gallstones, gallbladder wall thickening, or biliary dilatation. Pancreas: Unremarkable. No pancreatic ductal dilatation or surrounding inflammatory changes. Spleen: Normal in size without focal abnormality. Adrenals/Urinary Tract: Adrenal glands are within normal limits. Kidneys demonstrate no renal calculi or obstructive changes. Visualized ureters are unremarkable. Stomach/Bowel: Visualized bowel structures show no acute abnormality. Vascular/Lymphatic: Atherosclerotic calcifications  of the aorta are seen. Other: No free fluid is seen. Musculoskeletal: No acute or significant osseous findings. IMPRESSION: Mild bibasilar atelectasis although improved when compared with the prior study. No other focal abnormality is seen.  Electronically Signed   By: Inez Catalina M.D.   On: 05/17/2021 18:36   DG HIP UNILAT WITH PELVIS 2-3 VIEWS RIGHT  Result Date: 05/17/2021 CLINICAL DATA:  Right hip pain and altered mental status, initial encounter EXAM: DG HIP (WITH OR WITHOUT PELVIS) 3V RIGHT COMPARISON:  05/10/2021 CT FINDINGS: Pelvic ring is intact. 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 No discrete fracture is seen. Remodeling of the right femoral head is noted. No soft tissue abnormality is seen. IMPRESSION: Significant degenerative changes right greater than left similar to that seen on prior CT. No acute abnormality noted. Electronically Signed   By: Inez Catalina M.D.   On: 05/17/2021 18:38    Impression/Plan: Karina Clarke is a 60 year old female with history of schizophrenia, HIV and COPD, recently discharged on 6/21 and brought back into ED later that day for suspected overdose due to unresponsiveness for which she was given narcan.   Upon previous admission, transaminases were elevated, subsequently elevated on this admission as well, however, trending down since previous admission on 6/16  with ALT 108 and AST 151 (down from 272 and 913, respectively). No elevation in alk phos or bilirubin, INR remains WNL. Tylenol level unremarkable, Hepatitis serologies were non reactive, ANA, SMA and Immunoglobulins all WNL ruling out autoimmune hepatitis as etiology. No history of alcohol use. She denies abdominal pain, nausea or vomiting, no jaundice or ascites present,  R Factor indicates hepatocellular etiology, however, Suspect transaminitis possibly drug induced, related to ischemia or due to contributory metabolic process as CT abdomen pelvis w/o contrast was also unremarkable for hepatobiliary abnormalities.   SLP evaluation during previous admission as well as repeat BSE performed yesterday reveal suspected esophageal dysphagia. Patient  currently on Dysphagia 3 diet, tolerating well per nursing staff. This seems to be an ongoing problem. Recommend non urgent EGD with possible dilation to r/o esophageal stricture as etiology of dysphagia and odynophagia.   Continue to trend HFP 2. Recommend EGD +/- dilation to evaluate for esophageal stricture as etiology of dysphagia.  3. Avoid Tylenol  4. NPO after midnight.      LOS: 3 days    05/18/2021, 11:13 AM  Chelsea L. Alver Sorrow, MSN, APRN, AGNP-C Adult-Gerontology Nurse Practitioner [facility]  Addendum: Patient seen in conjunction with Scherrie Gerlach, MSN, APRN, AGPN-C. Patient initially seen 6/17 by GI due to acutely elevated transaminases in setting of sepsis due to pneumonia and possible UTI, concern for ischemic process, with improvement in transaminases during her admission and extensive evaluation unrevealing. GI signed off and she was discharged on 6/21 but returned same day with recurrent unresponsiveness (UDS positive for BZDs). She is back to her cognitive baseline. Difficult to obtain report from patient due to cognitive history but as noted above she does have likely esophageal component contributing to difficulty with diet. Currently dysphagia 3. Uncontrolled GERD, esophagitis, candida, multiple differentials likely in this setting and history is limited.   I spoke with patient's POA, which is her sister [name] [phone]), regarding EGD with dilation. She is agreeable to whatever is needed. Discussed risks and benefits with stated understanding. Will plan on EGD/dilation with Dr. Gala Romney tomorrow at [place]. Will hold Lovenox  dosing this evening. I am also adding PPI BID, as she has not been on a PPI. Orders have been placed for EGD/dilation, Lovenox is on hold for this evening's dose, and she is NPO after midnight.   Annitta Needs, PhD, ANP-BC [facility]

## 2021-05-18 NOTE — Progress Notes (Signed)
PROGRESS NOTE    Patient: Karina Clarke                            PCP: Rosita Fire, MD                    DOB: [birth year]            DOA: 05/15/2021 ALP:[id number]             DOS: 05/18/2021, 12:15 PM   LOS: 3 days   Date of Service: The patient was seen and examined on 05/18/2021  Subjective:  Patient was seen and examined this morning, more alert awake communicating With poor insight  Overnight patient was complaining of abdominal pain..  CT abdomen was obtained negative, right hip pain, x-ray was obtained negative for any acute changes or fractures  Speech evaluating, recommending GI evaluation  Brief Narrative:   Ottilia Pippenger is a 60 y.o. female with medical history significant for schizophrenia, HIV, COPD.  Patient was brought from nursing home with reports of unresponsiveness, with possible overdose.  She was given some Narcan prior to arrival as well as in the ED with some improvement in responsiveness.  She has been admitted with acute metabolic encephalopathy likely multifactorial in the setting of pneumonia, azotemia/AKI, and elevated liver enzymes.  GI consulted to assist with evaluation of liver injury with labs showing signs of improvement   Assessment & Plan:   Active Problems:   HIV (human immunodeficiency virus infection) ([place])   Hospital-acquired pneumonia   Acute respiratory failure with hypoxia ([place])   Diabetes mellitus, new onset ([place])   Chronic paranoid schizophrenia ([place])   Transaminitis   Hypomagnesemia   Hypoalbuminemia   Essential hypertension    Acute toxic metabolic encephalopathy-multifactorial;  -Improving back to baseline, with a poor insight, but able to communicate better now.  -Per nursing staff mentation back to baseline able to respond to yes or no question,  will be able to eat with assist only -Still having garbled speech, with minimal clarity. -Likely related to metabolic derangements that are also showing signs of  improvement -We will discontinue IV fluids - will be on dysphagia 3 -Head CT without acute findings -Continue to treat AKI as well as sepsis -UDS only positive for benzodiazepines with Xanax prescription outpatient -PT evaluation >>> recommending SNF   Abdominal pain/dysphagia  CT abdomen 05/17/2021 reviewed no for any acute findings -Recommended dysphagia 3 diet -Speech evaluated, recommending GI consultation for possible strictures -We will continue as needed analgesics,    AKI-improving -Baseline 1-1.3 >> 0.64 >> 0.66  -Continue on half-normal saline due to hypernatremia >>>  -Rechecking labs -Monitor urine output   Severe sepsis secondary to pneumonia and also possible UTI -Continue IV Unasyn and discontinue further azithromycin -Urine cultures >>  no growth -Blood cultures >>> NGTD   Transaminitis - improving -Possibly due to drug-induced liver injury versus ischemia -Appreciate ongoing GI evaluation -Continue to follow LFTs AST/ALT/ ... Trending down -Stable, avoiding hepatotoxins   Hypernatremia-improving -Changed IV fluid to LR, following LABs  -Na 147, 149, 146, >>>140, 142   Elevated troponin -2D echocardiogram 60-65% EF with grade 1 diastolic dysfunction   Thrombocytopenia- Stable  -Likely related to sepsis -Continue to monitor trend -Avoid heparin agents   HIV -HIV viral load not detected 12/2020 with stable CD4 count -Hold medications for now   Schizophrenia -Resume Haldol, Depakote, and Seroquel -Xanax may be resumed upon discharge  Hypertension-stable   Type 2 diabetes-with hypoglycemia -Hemoglobin A1c 6.0% -Discontinue D5 half-normal saline, reducing Lantus (10 units nightly) CBG QA CHS, SSI coverage    Family Communication: None at bedside, called sister 6/17, tried calling 6/20 with no  response  --------------------------------------------------------------------------------------------------------------------------------------------------- Cultures; Urine cultures 05/16/2021 >> NGTD  Antimicrobials: P.o. Levaquin  Consultants: None   ------------------------------------------------------------------------------------------------------------------------------------------------  DVT prophylaxis:  SCD/Compression stockings Code Status:   Code Status: Full Code  Family Communication: No family member present at bedside- attempt will be made to update daily The above findings and plan of care has been discussed with patient (and family)  in detail,  they expressed understanding and agreement of above. -Advance care planning has been discussed.   Admission status:   Status is: Inpatient  Remains inpatient appropriate because:Inpatient level of care appropriate due to severity of illness  Dispo: The patient is from: SNF               Anticipated d/c is to: SNF in 1 day               Patient currently is not medically stable to d/c.   Difficult to place patient No      Level of care: Med-Surg   Procedures:   No admission procedures for hospital encounter.    Antimicrobials:  Anti-infectives (From admission, onward)    Start     Dose/Rate Route Frequency Ordered Stop   05/18/21 1000  abacavir-lamiVUDine (EPZICOM) 600-300 MG per tablet 1 tablet        1 tablet Oral Daily 05/18/21 0909     05/18/21 1000  atazanavir (REYATAZ) capsule 400 mg        400 mg Oral Daily with breakfast 05/18/21 0909     05/18/21 0000  levofloxacin (LEVAQUIN) 750 MG tablet        750 mg Oral Daily 05/17/21 1427 05/23/21 2359   05/17/21 1000  levofloxacin (LEVAQUIN) tablet 750 mg        750 mg Oral Daily 05/17/21 0813     05/15/21 2130  azithromycin (ZITHROMAX) 500 mg in sodium chloride 0.9 % 250 mL IVPB  Status:  Discontinued        500 mg 250 mL/hr over 60 Minutes  Intravenous Every 24 hours 05/15/21 2127 05/17/21 0813   05/15/21 2100  cefTRIAXone (ROCEPHIN) 1 g in sodium chloride 0.9 % 100 mL IVPB  Status:  Discontinued        1 g 200 mL/hr over 30 Minutes Intravenous Every 24 hours 05/15/21 2053 05/17/21 0813        Medication:   abacavir-lamiVUDine  1 tablet Oral Daily   ALPRAZolam  0.25 mg Oral TID   atazanavir  400 mg Oral Q breakfast   atorvastatin  10 mg Oral Daily   Chlorhexidine Gluconate Cloth  6 each Topical Daily   divalproex  500 mg Oral 2 times per day   enoxaparin (LOVENOX) injection  40 mg Subcutaneous Q24H   feeding supplement  237 mL Oral BID BM   guaiFENesin-dextromethorphan  10 mL Oral BID   haloperidol  10 mg Oral TID   insulin aspart  0-6 Units Subcutaneous Q4H   insulin detemir  10 Units Subcutaneous QHS   lactobacillus  1 g Oral TID WC   levofloxacin  750 mg Oral Daily   magnesium oxide  400 mg Oral q morning   metoprolol tartrate  25 mg Oral BID WC   sucralfate  1 g Oral TID WC  trihexyphenidyl  2 mg Oral BID WC       Objective:   Vitals:   05/17/21 0500 05/17/21 1147 05/17/21 2200 05/18/21 0500  BP: (!) 148/85 126/69 (!) 142/70 (!) 150/76  Pulse:  92 67 66  Resp:  (!) 22 16 20   Temp: 98.9 F (37.2 C) 97.9 F (36.6 C) 98.4 F (36.9 C) 98.5 F (36.9 C)  TempSrc: Oral Oral Oral Oral  SpO2: 100% 100% 95% 94%  Weight:      Height:        Intake/Output Summary (Last 24 hours) at 05/18/2021 1215 Last data filed at 05/18/2021 0600 Gross per 24 hour  Intake 533.23 ml  Output 400 ml  Net 133.23 ml   Filed Weights   05/15/21 1724 05/16/21 0946  Weight: 98.3 kg 98.6 kg     Examination:       Physical Exam:   General:  More alert, verbal, communicating this a.m.  HEENT:  Normocephalic, PERRL, otherwise with in Normal limits   Neuro:  CNII-XII intact. , normal motor and sensation, reflexes intact   Lungs:   Clear to auscultation BL, Respirations unlabored, no wheezes / crackles  Cardio:     S1/S2, RRR, No murmure, No Rubs or Gallops   Abdomen:   Soft, non-tender, bowel sounds active all four quadrants,  no guarding or peritoneal signs.  Muscular skeletal:  Severe generalized weaknesses-bedbound Limited exam - in bed, able to move all 4 extremities,   2+ pulses,  symmetric, No pitting edema  Skin:  Dry, warm to touch, negative for any Rashes,  Wounds: Please see nursing documentation             ------------------------------------------------------------------------------------------------------------------------------------------    LABs:  CBC Latest Ref Rng & Units 05/18/2021 05/17/2021 05/16/2021  WBC 4.0 - 10.5 K/uL 12.8(H) 10.2 6.0  Hemoglobin 12.0 - 15.0 g/dL 10.8(L) 11.2(L) 10.9(L)  Hematocrit 36.0 - 46.0 % 33.7(L) 34.0(L) 33.7(L)  Platelets 150 - 400 K/uL 252 228 176   CMP Latest Ref Rng & Units 05/18/2021 05/17/2021 05/16/2021  Glucose 70 - 99 mg/dL 67(L) 101(H) 92  BUN 6 - 20 mg/dL 16 14 14   Creatinine 0.44 - 1.00 mg/dL 0.64 0.66 0.64  Sodium 135 - 145 mmol/L 142 140 143  Potassium 3.5 - 5.1 mmol/L 3.7 3.6 3.8  Chloride 98 - 111 mmol/L 109 108 111  CO2 22 - 32 mmol/L 27 25 26   Calcium 8.9 - 10.3 mg/dL 8.7(L) 8.7(L) 8.6(L)  Total Protein 6.5 - 8.1 g/dL - - 5.6(L)  Total Bilirubin 0.3 - 1.2 mg/dL - - 0.5  Alkaline Phos 38 - 126 U/L - - 53  AST 15 - 41 U/L - - 151(H)  ALT 0 - 44 U/L - - 108(H)       Micro Results Recent Results (from the past 240 hour(s))  Blood culture (routine single)     Status: None   Collection Time: 05/10/21 12:31 PM   Specimen: BLOOD  Result Value Ref Range Status   Specimen Description BLOOD LEFT ANTECUBITAL  Final   Special Requests   Final    BOTTLES DRAWN AEROBIC AND ANAEROBIC Blood Culture adequate volume   Culture   Final    NO GROWTH 5 DAYS Performed at [facility], [address] [place], [place] [address]    Report Status 05/15/2021 FINAL  Final  Urine culture     Status: None   Collection Time: 05/10/21 12:31  PM   Specimen: In/Out Cath Urine  Result Value Ref Range Status   Specimen Description   Final    IN/OUT CATH URINE Performed at [facility], [address] [place], [place] [address]    Special Requests   Final    NONE Performed at [facility], [address], [place], [place] [address]    Culture   Final    NO GROWTH Performed at [facility], [place] [address] [place], [place] [address]    Report Status 05/12/2021 FINAL  Final  Resp Panel by RT-PCR (Flu A&B, Covid) Nasopharyngeal Swab     Status: None   Collection Time: 05/10/21  2:58 PM   Specimen: Nasopharyngeal Swab; Nasopharyngeal(NP) swabs in vial transport medium  Result Value Ref Range Status   SARS Coronavirus 2 by RT PCR NEGATIVE NEGATIVE Final    Comment: (NOTE) SARS-CoV-2 target nucleic acids are NOT DETECTED.  The SARS-CoV-2 RNA is generally detectable in upper respiratory specimens during the acute phase of infection. The lowest concentration of SARS-CoV-2 viral copies this assay can detect is 138 copies/mL. A negative result does not preclude SARS-Cov-2 infection and should not be used as the sole basis for treatment or other patient management decisions. A negative result may occur with  improper specimen collection/handling, submission of specimen other than nasopharyngeal swab, presence of viral mutation(s) within the areas targeted by this assay, and inadequate number of viral copies(<138 copies/mL). A negative result must be combined with clinical observations, patient history, and epidemiological information. The expected result is Negative.  Fact Sheet for Patients:  EntrepreneurPulse.com.au  Fact Sheet for Healthcare Providers:  IncredibleEmployment.be  This test is no t yet approved or cleared by the [place] FDA and  has been authorized for detection and/or diagnosis of SARS-CoV-2 by FDA under an Emergency Use Authorization (EUA). This EUA will remain   in effect (meaning this test can be used) for the duration of the COVID-19 declaration under Section 564(b)(1) of the Act, 21 U.S.C.section 360bbb-3(b)(1), unless the authorization is terminated  or revoked sooner.       Influenza A by PCR NEGATIVE NEGATIVE Final   Influenza B by PCR NEGATIVE NEGATIVE Final    Comment: (NOTE) The Xpert Xpress SARS-CoV-2/FLU/RSV plus assay is intended as an aid in the diagnosis of influenza from Nasopharyngeal swab specimens and should not be used as a sole basis for treatment. Nasal washings and aspirates are unacceptable for Xpert Xpress SARS-CoV-2/FLU/RSV testing.  Fact Sheet for Patients: EntrepreneurPulse.com.au  Fact Sheet for Healthcare Providers: IncredibleEmployment.be  This test is not yet approved or cleared by the [place] FDA and has been authorized for detection and/or diagnosis of SARS-CoV-2 by FDA under an Emergency Use Authorization (EUA). This EUA will remain in effect (meaning this test can be used) for the duration of the COVID-19 declaration under Section 564(b)(1) of the Act, 21 U.S.C. section 360bbb-3(b)(1), unless the authorization is terminated or revoked.  Performed at [facility], [address], [place], [place] [address]   MRSA Next Gen by PCR, Nasal     Status: None   Collection Time: 05/11/21 12:45 PM   Specimen: Nasal Mucosa; Nasal Swab  Result Value Ref Range Status   MRSA by PCR Next Gen NOT DETECTED NOT DETECTED Final    Comment: (NOTE) The GeneXpert MRSA Assay (FDA approved for NASAL specimens only), is one component of a comprehensive MRSA colonization surveillance program. It is not intended to diagnose MRSA infection nor to guide or monitor treatment for MRSA infections. Test performance is  not FDA approved in patients less than 31 years old. Performed at [facility], [address], [place], [place] [address]   SARS CORONAVIRUS 2 (TAT 6-24 HRS) Nasopharyngeal  Nasopharyngeal Swab     Status: None   Collection Time: 05/15/21  8:50 PM   Specimen: Nasopharyngeal Swab  Result Value Ref Range Status   SARS Coronavirus 2 NEGATIVE NEGATIVE Final    Comment: (NOTE) SARS-CoV-2 target nucleic acids are NOT DETECTED.  The SARS-CoV-2 RNA is generally detectable in upper and lower respiratory specimens during the acute phase of infection. Negative results do not preclude SARS-CoV-2 infection, do not rule out co-infections with other pathogens, and should not be used as the sole basis for treatment or other patient management decisions. Negative results must be combined with clinical observations, patient history, and epidemiological information. The expected result is Negative.  Fact Sheet for Patients: SugarRoll.be  Fact Sheet for Healthcare Providers: https://www.woods-mathews.com/  This test is not yet approved or cleared by the [place] FDA and  has been authorized for detection and/or diagnosis of SARS-CoV-2 by FDA under an Emergency Use Authorization (EUA). This EUA will remain  in effect (meaning this test can be used) for the duration of the COVID-19 declaration under Se ction 564(b)(1) of the Act, 21 U.S.C. section 360bbb-3(b)(1), unless the authorization is terminated or revoked sooner.  Performed at [facility], [place] [address] [place], [place] [address]   Urine Culture     Status: None   Collection Time: 05/16/21  9:46 AM   Specimen: Urine, Clean Catch  Result Value Ref Range Status   Specimen Description   Final    URINE, CLEAN CATCH Performed at [facility], [address], [place], [place] [address]    Special Requests   Final    NONE Performed at [facility], [address] [place], [place] [address]    Culture   Final    NO GROWTH Performed at [facility], [place] [address], [place], [place] [address]    Report Status 05/18/2021 FINAL  Final    Radiology  Reports CT ABDOMEN PELVIS WO CONTRAST  Result Date: 05/10/2021 CLINICAL DATA:  Lethargy. Abdominal pain. Elevated liver enzymes. Acute kidney injury. Altered mental status. History of umbilical hernia. EXAM: CT ABDOMEN AND PELVIS WITHOUT CONTRAST TECHNIQUE: Multidetector CT imaging of the abdomen and pelvis was performed following the standard protocol without IV contrast. COMPARISON:  None. FINDINGS: Lower chest: Hypoventilatory changes at the dependent lung bases. Coronary atherosclerosis. Hepatobiliary: Normal liver size. No liver mass. Normal gallbladder with no radiopaque cholelithiasis. No biliary ductal dilatation. Pancreas: Normal, with no mass or duct dilation. Spleen: Normal size. No mass. Adrenals/Urinary Tract: Normal adrenals. No renal stones. No hydronephrosis. No contour deforming renal masses. Bladder collapsed by indwelling Foley catheter and grossly normal. Stomach/Bowel: Normal non-distended stomach. Normal caliber small bowel with no small bowel wall thickening. Normal appendix. Normal large bowel with no diverticulosis, large bowel wall thickening or pericolonic fat stranding. Vascular/Lymphatic: Atherosclerotic nonaneurysmal abdominal aorta. No pathologically enlarged lymph nodes in the abdomen or pelvis. Reproductive: Grossly normal uterus.  No adnexal mass. Other: No pneumoperitoneum. Trace free fluid in the pelvic cul-de-sac. No focal fluid collections. No evidence of a ventral abdominal hernia. Musculoskeletal: No aggressive appearing focal osseous lesions. IMPRESSION: 1. No acute abnormality. No hydronephrosis. No evidence of bowel obstruction or acute bowel inflammation. Normal appendix. 2. Trace free fluid in the pelvic cul-de-sac, nonspecific. No adnexal masses. 3. Coronary atherosclerosis. 4. Aortic Atherosclerosis (  ICD10-I70.0). Electronically Signed   By: Ilona Sorrel M.D.   On: 05/10/2021 18:39   CT ABDOMEN WO CONTRAST  Result Date: 05/17/2021 CLINICAL DATA:  Abdominal pain,  possible recent overdose EXAM: CT ABDOMEN WITHOUT CONTRAST TECHNIQUE: Multidetector CT imaging of the abdomen was performed following the standard protocol without IV contrast. COMPARISON:  05/10/2021 FINDINGS: Lower chest: Mild basilar atelectasis is seen although improved when compared with the prior exam. Hepatobiliary: No focal liver abnormality is seen. No gallstones, gallbladder wall thickening, or biliary dilatation. Pancreas: Unremarkable. No pancreatic ductal dilatation or surrounding inflammatory changes. Spleen: Normal in size without focal abnormality. Adrenals/Urinary Tract: Adrenal glands are within normal limits. Kidneys demonstrate no renal calculi or obstructive changes. Visualized ureters are unremarkable. Stomach/Bowel: Visualized bowel structures show no acute abnormality. Vascular/Lymphatic: Atherosclerotic calcifications of the aorta are seen. Other: No free fluid is seen. Musculoskeletal: No acute or significant osseous findings. IMPRESSION: Mild bibasilar atelectasis although improved when compared with the prior study. No other focal abnormality is seen. Electronically Signed   By: Inez Catalina M.D.   On: 05/17/2021 18:36   DG Chest 1 View  Result Date: 05/15/2021 CLINICAL DATA:  60 year old female with altered mental status. EXAM: CHEST  1 VIEW COMPARISON:  Chest radiograph dated 05/10/2021. FINDINGS: There is cardiomegaly with vascular congestion. Bibasilar densities represent atelectasis or infiltrate. There is no pleural effusion pneumothorax. Atherosclerotic calcification of the aortic arch. No acute osseous pathology. IMPRESSION: 1. Cardiomegaly with vascular congestion. 2. Bibasilar atelectasis or infiltrate. Electronically Signed   By: Anner Crete M.D.   On: 05/15/2021 18:13   CT Head Wo Contrast  Result Date: 05/15/2021 CLINICAL DATA:  Mental status changes. Discharge 2 hours ago after hospitalization for altered mental status and urinary tract infection. EXAM: CT HEAD  WITHOUT CONTRAST TECHNIQUE: Contiguous axial images were obtained from the base of the skull through the vertex without intravenous contrast. COMPARISON:  05/10/21 FINDINGS: Brain: No evidence of acute infarction, hemorrhage, hydrocephalus, extra-axial collection or mass lesion/mass effect. There are patchy low-attenuation areas within the subcortical and periventricular white matter compatible with chronic microvascular disease. Vascular: No hyperdense vessel or unexpected calcification. Skull: Normal. Negative for fracture or focal lesion. Sinuses/Orbits: Air-fluid levels identified within the maxillary sinuses. Mild diffuse mucosal thickening with bubbly opacification of the sphenoid sinus and ethmoid air cells. Mastoid air cells are clear. Other: None IMPRESSION: 1. No acute intracranial abnormalities. 2. Chronic small vessel ischemic disease. 3. Acute on chronic sinus inflammation. Electronically Signed   By: Kerby Moors M.D.   On: 05/15/2021 19:46   CT Head Wo Contrast  Result Date: 05/10/2021 CLINICAL DATA:  Unresponsive prior to arrival. Question intracranial hemorrhage. EXAM: CT HEAD WITHOUT CONTRAST TECHNIQUE: Contiguous axial images were obtained from the base of the skull through the vertex without intravenous contrast. COMPARISON:  None. FINDINGS: Brain: Brainstem appears normal. No focal cerebellar finding. Cerebral hemispheres show moderate chronic small-vessel ischemic changes of the white matter. No sign of acute infarction, mass lesion, hemorrhage, hydrocephalus or extra-axial collection. Vascular: There is atherosclerotic calcification of the major vessels at the base of the brain. Skull: Negative Sinuses/Orbits: Clear/normal Other: None IMPRESSION: No acute finding by CT. Moderate chronic small-vessel ischemic changes of the cerebral hemispheric white matter. Electronically Signed   By: Nelson Chimes M.D.   On: 05/10/2021 13:56   DG Chest Port 1 View  Result Date: 05/10/2021 CLINICAL  DATA:  Unresponsive with concern for drug overdose EXAM: PORTABLE CHEST 1 VIEW COMPARISON:  January 27, 2018 chest  radiograph. Chest CT October 17, 2017 FINDINGS: There are areas of ill-defined airspace opacity in right mid and lower lung regions. Lungs elsewhere are clear. Heart is upper normal in size, stable, with prominence of central pulmonary arteries, consistent with pulmonary arterial hypertension. No adenopathy appreciable. No bone lesions IMPRESSION: Areas of ill-defined opacity right mid and lower lung regions. Suspect early pneumonia or possible foci of aspiration. Lungs elsewhere clear. Heart upper normal in size with evidence of pulmonary arterial hypertension. Note that pulmonary arterial hypertension was present on prior CT examination. Electronically Signed   By: Lowella Grip III M.D.   On: 05/10/2021 13:23   ECHOCARDIOGRAM COMPLETE  Result Date: 05/11/2021    ECHOCARDIOGRAM REPORT   Patient Name:   SINTIA MCKISSIC Date of Exam: 05/11/2021 Medical Rec #:  [id number]      Height:       68.0 in Accession #:    [id number]     Weight:       207.0 lb Date of Birth:  11-30-1960      BSA:          2.074 m Patient Age:    69 years       BP:           110/69 mmHg Patient Gender: F              HR:           100 bpm. Exam Location:  [facility] Procedure: 2D Echo, Cardiac Doppler and Color Doppler Indications:    Elevated Troponin  History:        Patient has prior history of Echocardiogram examinations, most                 recent 10/17/2017. COPD; Signs/Symptoms:Altered Mental Status.                 Sepsis, elevated Troponin, schizophrenia, HIV, pneumonia.  Sonographer:    Dustin Flock RDCS Referring Phys: [place]  1. Left ventricular ejection fraction, by estimation, is 60 to 65%. The left ventricle has normal function. The left ventricle has no regional wall motion abnormalities. There is mild left ventricular hypertrophy. Left ventricular diastolic parameters are  consistent with Grade I diastolic dysfunction (impaired relaxation).  2. Right ventricular systolic function is mildly reduced. The right ventricular size is moderately enlarged. There is normal pulmonary artery systolic pressure.  3. The mitral valve is normal in structure. No evidence of mitral valve regurgitation. No evidence of mitral stenosis.  4. The aortic valve is tricuspid. Aortic valve regurgitation is not visualized. No aortic stenosis is present.  5. The inferior vena cava is normal in size with greater than 50% respiratory variability, suggesting right atrial pressure of 3 mmHg. FINDINGS  Left Ventricle: Left ventricular ejection fraction, by estimation, is 60 to 65%. The left ventricle has normal function. The left ventricle has no regional wall motion abnormalities. The left ventricular internal cavity size was normal in size. There is  mild left ventricular hypertrophy. Left ventricular diastolic parameters are consistent with Grade I diastolic dysfunction (impaired relaxation). Normal left ventricular filling pressure. Right Ventricle: The right ventricular size is moderately enlarged. Right vetricular wall thickness was not assessed. Right ventricular systolic function is mildly reduced. There is normal pulmonary artery systolic pressure. The tricuspid regurgitant velocity is 2.45 m/s, and with an assumed right atrial pressure of 3 mmHg, the estimated right ventricular systolic pressure is 64.3 mmHg. Left Atrium: Left atrial size was  normal in size. Right Atrium: Right atrial size was normal in size. Pericardium: There is no evidence of pericardial effusion. Mitral Valve: The mitral valve is normal in structure. No evidence of mitral valve regurgitation. No evidence of mitral valve stenosis. Tricuspid Valve: The tricuspid valve is not well visualized. Tricuspid valve regurgitation is trivial. No evidence of tricuspid stenosis. Aortic Valve: The aortic valve is tricuspid. Aortic valve regurgitation  is not visualized. No aortic stenosis is present. Aortic valve mean gradient measures 5.2 mmHg. Aortic valve peak gradient measures 10.0 mmHg. Aortic valve area, by VTI measures 2.35  cm. Pulmonic Valve: The pulmonic valve was not well visualized. Pulmonic valve regurgitation is not visualized. No evidence of pulmonic stenosis. Aorta: The aortic root is normal in size and structure. Pulmonary Artery: Indeterminant PASP, inadequate TR jet. Venous: The inferior vena cava is normal in size with greater than 50% respiratory variability, suggesting right atrial pressure of 3 mmHg. IAS/Shunts: No atrial level shunt detected by color flow Doppler.  LEFT VENTRICLE PLAX 2D LVIDd:         3.63 cm  Diastology LVIDs:         2.39 cm  LV e' medial:    5.66 cm/s LV PW:         1.16 cm  LV E/e' medial:  9.8 LV IVS:        1.13 cm  LV e' lateral:   10.00 cm/s LVOT diam:     2.00 cm  LV E/e' lateral: 5.5 LV SV:         57 LV SV Index:   27 LVOT Area:     3.14 cm  RIGHT VENTRICLE RV Basal diam:  3.96 cm RV S prime:     13.20 cm/s TAPSE (M-mode): 2.3 cm LEFT ATRIUM             Index       RIGHT ATRIUM           Index LA diam:        3.50 cm 1.69 cm/m  RA Area:     18.40 cm LA Vol (A2C):   55.9 ml 26.95 ml/m RA Volume:   51.60 ml  24.88 ml/m LA Vol (A4C):   42.8 ml 20.64 ml/m LA Biplane Vol: 51.7 ml 24.93 ml/m  AORTIC VALVE AV Area (Vmax):    2.12 cm AV Area (Vmean):   2.49 cm AV Area (VTI):     2.35 cm AV Vmax:           158.22 cm/s AV Vmean:          107.448 cm/s AV VTI:            0.242 m AV Peak Grad:      10.0 mmHg AV Mean Grad:      5.2 mmHg LVOT Vmax:         107.00 cm/s LVOT Vmean:        85.000 cm/s LVOT VTI:          0.181 m LVOT/AV VTI ratio: 0.75  AORTA Ao Root diam: 2.50 cm MITRAL VALVE               TRICUSPID VALVE MV Area (PHT): 5.46 cm    TR Peak grad:   24.0 mmHg MV Decel Time: 139 msec    TR Vmax:        245.00 cm/s MV E velocity: 55.20 cm/s MV A velocity: 83.70 cm/s  SHUNTS MV E/A ratio:  0.66         Systemic VTI:  0.18 m                            Systemic Diam: 2.00 cm Carlyle Dolly MD Electronically signed by Carlyle Dolly MD Signature Date/Time: 05/11/2021/5:26:32 PM    Final    DG HIP UNILAT WITH PELVIS 2-3 VIEWS RIGHT  Result Date: 05/17/2021 CLINICAL DATA:  Right hip pain and altered mental status, initial encounter EXAM: DG HIP (WITH OR WITHOUT PELVIS) 3V RIGHT COMPARISON:  05/10/2021 CT FINDINGS: Pelvic ring is intact. 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 No discrete fracture is seen. Remodeling of the right femoral head is noted. No soft tissue abnormality is seen. IMPRESSION: Significant degenerative changes right greater than left similar to that seen on prior CT. No acute abnormality noted. Electronically Signed   By: Inez Catalina M.D.   On: 05/17/2021 18:38    SIGNED: Deatra James, MD, FHM. [facility],  Pager (please use [email or web address] to page/text) Please use Epic Secure Chat for non-urgent communication (7AM-7PM)  If 7PM-7AM, please contact night-coverage [email or web address], 05/18/2021, 12:15 PM

## 2021-05-18 NOTE — NC FL2 (Signed)
[place] LEVEL OF CARE SCREENING TOOL     IDENTIFICATION  Patient Name: Karina Clarke Birthdate: [birth year] Sex: female Admission Date (Current Location): 05/15/2021  [facility] and [place] Number:  [employer] and Address:  [place] [address], [place]      Provider Number: [id number]  Attending Physician Name and Address:  Deatra James, MD  Relative Name and Phone Number:  [name] - Legal Guardian [phone]    Current Level of Care: Hospital Recommended Level of Care: [facility] Prior Approval Number:    Date Approved/Denied:   PASRR Number:    Discharge Plan: Domiciliary (Rest home) ([facility])    Current Diagnoses: Patient Active Problem List   Diagnosis Date Noted   Hypomagnesemia 05/16/2021   Hypoalbuminemia 05/16/2021   Essential hypertension 05/16/2021   AMS (altered mental status) 05/10/2021   PNA (pneumonia) 05/10/2021   Transaminitis 05/10/2021   Diabetes mellitus, new onset ([place]) 10/24/2017   Hyperkalemia 10/24/2017   AKI (acute kidney injury) ([place]) 10/24/2017   COPD exacerbation ([place]) 10/21/2017   Hospital-acquired pneumonia 10/12/2017   Acute respiratory failure with hypoxia ([place]) 10/12/2017   AIDS ([place]) 10/12/2017   Influenza 12/26/2016   Sepsis ([place]) 12/26/2016   CAP (community acquired pneumonia) 12/26/2016   Tobacco use disorder 12/26/2016   HIV (human immunodeficiency virus infection) ([place]) 12/26/2016   Schizophrenia ([place]) 12/26/2016   Personality disorder ([place]) 08/22/2016   Encounter for screening colonoscopy 05/24/2015   Vaginal discharge 11/04/2013   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   Chronic paranoid schizophrenia ([place]) 10/19/2011   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   Thrombocytopenia, unspecified ([place]) 10/19/2011    Orientation RESPIRATION BLADDER Height & Weight     Self  Normal External catheter Weight: 217 lb 6 oz (98.6  kg) Height:  5\' 8"  (172.7 cm)  BEHAVIORAL SYMPTOMS/MOOD NEUROLOGICAL BOWEL NUTRITION STATUS      Incontinent Diet (See DC summary)  AMBULATORY STATUS COMMUNICATION OF NEEDS Skin   Extensive Assist Verbally Normal                       Personal Care Assistance Level of Assistance  Bathing, Feeding, Dressing Bathing Assistance: Maximum assistance Feeding assistance: Maximum assistance Dressing Assistance: Maximum assistance     Functional Limitations Info  Sight, Hearing, Speech Sight Info: Adequate Hearing Info: Adequate Speech Info: Adequate    SPECIAL CARE FACTORS FREQUENCY  PT (By licensed PT)     PT Frequency: 3 times a week              Contractures Contractures Info: Not present    Additional Factors Info  Code Status, Allergies, Psychotropic, Insulin Sliding Scale Code Status Info: Full Allergies Info: [place] Psychotropic Info: ALPRAZolam, atazanavir, divalproex, haloperidol, haloperidol decanoate, Insulin Sliding Scale Info: Correction coverage: Very Sensitive (ESRD/Dialysis)   CBG < 70: Implement Hypoglycemia Standing Orders and refer to Hypoglycemia Standing Orders sidebar report   CBG 70 - 120: 0 units   CBG 121 - 150: 0 units   CBG 151 - 200: 1 unit   CBG 201-250: 2 units   CBG 251-300: 3 units   CBG 301-350: 4 units   CBG 351-400: 5 units   CBG > 400 Give 6 units and call MD      Discharge Medications: 05/18/2021  abacavir-lamiVUDine 600-300 MG tablet Commonly known as: EPZICOM Take 1 tablet by mouth daily.  acetaminophen 650 MG CR tablet Commonly known as: TYLENOL Take 650 mg by mouth every 8 (eight) hours as needed for pain.          albuterol 108 (90 Base) MCG/ACT inhaler Commonly known as: VENTOLIN HFA Inhale 1-2 puffs into the lungs 2 (two) times daily as needed for wheezing or shortness of breath.          ALPRAZolam 0.5 MG tablet Commonly known as: XANAX Take 0.5 mg by mouth 2 (two) times daily as needed for anxiety.           atazanavir 200 MG capsule Commonly known as: REYATAZ Take 400 mg by mouth daily.          atorvastatin 10 MG tablet Commonly known as: LIPITOR Take 10 mg by mouth daily.          divalproex 500 MG DR tablet Commonly known as: DEPAKOTE Take 500 mg by mouth 2 (two) times daily. Take 1 tablet (500 mg) by mouth at 4pm and 8pm daily          haloperidol 10 MG tablet Commonly known as: HALDOL Take 10 mg by mouth 3 (three) times daily.          haloperidol decanoate 100 MG/ML injection Commonly known as: HALDOL DECANOATE Inject 200 mg into the muscle every 28 (twenty-eight) days.          hydrOXYzine 25 MG tablet Commonly known as: ATARAX/VISTARIL Take 25 mg by mouth at bedtime.         Icon medications to change how you take   insulin detemir 100 UNIT/ML injection Commonly known as: LEVEMIR Inject 0.33 mLs (33 Units total) into the skin at bedtime. Signed [id number] Isaac Bliss, MD You were taking this medication differently than prescribed.         Icon medications to start taking   levofloxacin 750 MG tablet Commonly known as: LEVAQUIN Take 1 tablet (750 mg total) by mouth daily for 5 days. Signed IB:BCWUG A Shahmehdi, MD          Magnesium Oxide 400 MG Caps Take 1 capsule (400 mg total) by mouth every morning. Signed QB:[id number] Abrol, MD          metoprolol tartrate 25 MG tablet Commonly known as: LOPRESSOR Take 1 tablet by mouth 2 (two) times daily.          polyethylene glycol 17 g packet Commonly known as: MIRALAX / GLYCOLAX Take 17 g by mouth daily as needed for mild constipation.          raltegravir 400 MG tablet Commonly known as: ISENTRESS Take 400 mg by mouth 2 (two) times daily.          trihexyphenidyl 2 MG tablet Commonly known as: ARTANE Take 2 mg by mouth 2 (two) times daily with a meal.          valACYclovir 1000 MG tablet Commonly known as: VALTREX Take 1,000 mg by mouth daily.          Current Medications (05/18/2021):  This is the current hospital active  medication list Current Facility-Administered Medications  Medication Dose Route Frequency Provider Last Rate Last Admin   abacavir-lamiVUDine (EPZICOM) 600-300 MG per tablet 1 tablet  1 tablet Oral Daily Shahmehdi, Seyed A, MD       ALPRAZolam Duanne Moron) tablet 0.25 mg  0.25 mg Oral TID Skipper Cliche A, MD   0.25 mg at 05/18/21 0901   atazanavir (REYATAZ) capsule 400 mg  400 mg  Oral Q breakfast Shahmehdi, Seyed A, MD       atorvastatin (LIPITOR) tablet 10 mg  10 mg Oral Daily Shahmehdi, Seyed A, MD   10 mg at 05/18/21 0901   Chlorhexidine Gluconate Cloth 2 % PADS 6 each  6 each Topical Daily Roxan Hockey, MD   6 each at 05/18/21 0901   divalproex (DEPAKOTE SPRINKLE) capsule 500 mg  500 mg Oral 2 times per day [name], RPH   500 mg at 05/17/21 2230   [START ON 05/19/2021] enoxaparin (LOVENOX) injection 40 mg  40 mg Subcutaneous Q24H Carlan, Chelsea L, NP       feeding supplement (ENSURE ENLIVE / ENSURE PLUS) liquid 237 mL  237 mL Oral BID BM Adefeso, Oladapo, DO   237 mL at 05/18/21 0901   guaiFENesin-dextromethorphan (ROBITUSSIN DM) 100-10 MG/5ML syrup 10 mL  10 mL Oral BID Shahmehdi, Seyed A, MD   10 mL at 05/18/21 0900   haloperidol (HALDOL) tablet 10 mg  10 mg Oral TID Skipper Cliche A, MD   10 mg at 05/18/21 0900   HYDROmorphone (DILAUDID) injection 1 mg  1 mg Intravenous Q3H PRN Skipper Cliche A, MD   1 mg at 05/18/21 0931   hydrOXYzine (ATARAX/VISTARIL) tablet 25 mg  25 mg Oral TID PRN Skipper Cliche A, MD   25 mg at 05/17/21 1442   insulin aspart (novoLOG) injection 0-6 Units  0-6 Units Subcutaneous Q4H Adefeso, Oladapo, DO   1 Units at 05/16/21 2020   insulin detemir (LEVEMIR) injection 10 Units  10 Units Subcutaneous QHS Shahmehdi, Seyed A, MD       lactobacillus (FLORANEX/LACTINEX) granules 1 g  1 g Oral TID WC Shahmehdi, Seyed A, MD   1 g at 05/18/21 0900   levofloxacin (LEVAQUIN) tablet 750 mg  750 mg Oral Daily Shahmehdi, Seyed A, MD   750 mg at 05/17/21 1008    magnesium oxide (MAG-OX) tablet 400 mg  400 mg Oral q morning Shahmehdi, Seyed A, MD   400 mg at 05/18/21 0904   metoprolol tartrate (LOPRESSOR) tablet 25 mg  25 mg Oral BID WC Shahmehdi, Seyed A, MD   25 mg at 05/18/21 0901   ondansetron (ZOFRAN) injection 4 mg  4 mg Intravenous Q6H PRN Shahmehdi, Seyed A, MD       pantoprazole (PROTONIX) injection 40 mg  40 mg Intravenous Q12H Carlan, Chelsea L, NP       sucralfate (CARAFATE) 1 GM/10ML suspension 1 g  1 g Oral TID WC Shahmehdi, Seyed A, MD   1 g at 05/18/21 0600   trihexyphenidyl (ARTANE) tablet 2 mg  2 mg Oral BID WC Shahmehdi, Seyed A, MD   2 mg at 05/18/21 0901     Discharge Medications: Please see discharge summary for a list of discharge medications.  Relevant Imaging Results:  Relevant Lab Results:   Additional Information SS# [id number]  Natasha Bence, LCSW

## 2021-05-18 NOTE — Progress Notes (Signed)
Inpatient Diabetes Program Recommendations  AACE/ADA: New Consensus Statement on Inpatient Glycemic Control   Target Ranges:  Prepandial:   less than 140 mg/dL      Peak postprandial:   less than 180 mg/dL (1-2 hours)      Critically ill patients:  140 - 180 mg/dL  Results for Karina Clarke, Karina Clarke (MRN [id number]) as of 05/18/2021 07:50  Ref. Range 05/17/2021 04:56 05/17/2021 06:49 05/17/2021 07:29 05/17/2021 11:49 05/17/2021 17:32 05/17/2021 19:43 05/18/2021 00:30 05/18/2021 04:54 05/18/2021 05:39 05/18/2021 06:45  Glucose-Capillary Latest Ref Range: 70 - 99 mg/dL 70 131 (H) 118 (H) 104 (H) 112 (H) 116 (H) 89 53 (L) 67 (L) 142 (H)   Review of Glycemic Control  Diabetes history: DM2 Outpatient Diabetes medications: Levemir 33 units QHS Current orders for Inpatient glycemic control: Levemir 20 units QHS, Novolog 0-6 units Q4H  Inpatient Diabetes Program Recommendations:    Insulin: Fasting CBG 70 mg/dl on 05/17/21 and 53 mg/dl today. Please consider decreasing Levemir to 10 units QHS.  Thanks, Barnie Alderman, RN, MSN, CDE Diabetes Coordinator Inpatient Diabetes Program [phone] (Team Pager from 8am to 5pm)

## 2021-05-19 ENCOUNTER — Inpatient Hospital Stay (HOSPITAL_COMMUNITY): Payer: Medicaid Other | Admitting: Anesthesiology

## 2021-05-19 ENCOUNTER — Encounter (HOSPITAL_COMMUNITY)
Admission: EM | Disposition: A | Discharge status: Skilled Nursing Facility | Payer: Self-pay | Source: Home / Self Care | Attending: Family Medicine

## 2021-05-19 DIAGNOSIS — R1314 Dysphagia, pharyngoesophageal phase: Secondary | ICD-10-CM

## 2021-05-19 HISTORY — PX: ESOPHAGOGASTRODUODENOSCOPY (EGD) WITH PROPOFOL: SHX5813

## 2021-05-19 LAB — GLUCOSE, CAPILLARY
Glucose-Capillary: 103 mg/dL — ABNORMAL HIGH (ref 70–99)
Glucose-Capillary: 56 mg/dL — ABNORMAL LOW (ref 70–99)
Glucose-Capillary: 60 mg/dL — ABNORMAL LOW (ref 70–99)
Glucose-Capillary: 70 mg/dL (ref 70–99)
Glucose-Capillary: 71 mg/dL (ref 70–99)
Glucose-Capillary: 72 mg/dL (ref 70–99)
Glucose-Capillary: 72 mg/dL (ref 70–99)
Glucose-Capillary: 73 mg/dL (ref 70–99)
Glucose-Capillary: 96 mg/dL (ref 70–99)

## 2021-05-19 SURGERY — ESOPHAGOGASTRODUODENOSCOPY (EGD) WITH PROPOFOL
Anesthesia: General

## 2021-05-19 MED ORDER — PANTOPRAZOLE SODIUM 40 MG PO TBEC
40.0000 mg | DELAYED_RELEASE_TABLET | Freq: Every day | ORAL | Status: DC
  Administered 2021-05-19 – 2021-05-22 (×4): 40 mg via ORAL
  Filled 2021-05-19 (×5): qty 1

## 2021-05-19 MED ORDER — GLYCOPYRROLATE PF 0.2 MG/ML IJ SOSY
PREFILLED_SYRINGE | INTRAMUSCULAR | Status: AC
  Filled 2021-05-19: qty 1

## 2021-05-19 MED ORDER — DEXTROSE-NACL 5-0.45 % IV SOLN: INTRAVENOUS | Status: DC

## 2021-05-19 MED ORDER — PROPOFOL 10 MG/ML IV BOLUS
INTRAVENOUS | Status: AC
  Filled 2021-05-19: qty 40

## 2021-05-19 MED ORDER — LIDOCAINE HCL (PF) 2 % IJ SOLN
INTRAMUSCULAR | Status: AC
  Filled 2021-05-19: qty 5

## 2021-05-19 MED ORDER — MIDAZOLAM HCL 5 MG/5ML IJ SOLN
INTRAMUSCULAR | Status: DC | PRN
  Administered 2021-05-19: 1 mg via INTRAVENOUS

## 2021-05-19 MED ORDER — LIDOCAINE HCL (CARDIAC) PF 100 MG/5ML IV SOSY
PREFILLED_SYRINGE | INTRAVENOUS | Status: DC | PRN
  Administered 2021-05-19: 100 mg via INTRATRACHEAL

## 2021-05-19 MED ORDER — FENTANYL CITRATE (PF) 100 MCG/2ML IJ SOLN
INTRAMUSCULAR | Status: DC | PRN
  Administered 2021-05-19: 50 ug via INTRAVENOUS

## 2021-05-19 MED ORDER — INSULIN DETEMIR 100 UNIT/ML ~~LOC~~ SOLN
10.0000 [IU] | Freq: Every day | SUBCUTANEOUS | Status: DC
  Administered 2021-05-20 – 2021-05-21 (×2): 10 [IU] via SUBCUTANEOUS
  Filled 2021-05-19 (×3): qty 0.1

## 2021-05-19 MED ORDER — PROPOFOL 10 MG/ML IV BOLUS
INTRAVENOUS | Status: DC | PRN
  Administered 2021-05-19: 100 mg via INTRAVENOUS

## 2021-05-19 MED ORDER — STERILE WATER FOR IRRIGATION IR SOLN
Status: DC | PRN
  Administered 2021-05-19: 5 mL

## 2021-05-19 MED ORDER — MIDAZOLAM HCL 2 MG/2ML IJ SOLN
INTRAMUSCULAR | Status: AC
  Filled 2021-05-19: qty 2

## 2021-05-19 MED ORDER — PROPOFOL 500 MG/50ML IV EMUL
INTRAVENOUS | Status: DC | PRN
  Administered 2021-05-19: 200 ug/kg/min via INTRAVENOUS

## 2021-05-19 MED ORDER — FENTANYL CITRATE (PF) 100 MCG/2ML IJ SOLN
INTRAMUSCULAR | Status: AC
  Filled 2021-05-19: qty 2

## 2021-05-19 MED ORDER — GLYCOPYRROLATE PF 0.2 MG/ML IJ SOSY
PREFILLED_SYRINGE | INTRAMUSCULAR | Status: DC | PRN
  Administered 2021-05-19: .1 mg via INTRAVENOUS

## 2021-05-19 MED ORDER — LACTATED RINGERS IV SOLN: INTRAVENOUS | Status: DC | PRN

## 2021-05-19 NOTE — Progress Notes (Signed)
SLP Cancellation Note  Patient Details Name: Karina Clarke MRN: [id number] DOB: [birth year]   Cancelled treatment:       Reason Eval/Treat Not Completed: Patient at procedure or test/unavailable (EGD today)  Thank you,  Genene Churn, [place]  [place] 05/19/2021, 4:09 PM

## 2021-05-19 NOTE — Op Note (Signed)
[facility] Patient Name: Karina Clarke Procedure Date: 05/19/2021 9:34 AM MRN: [id number] Date of Birth: 1961/03/02 Attending MD: Norvel Richards , MD CSN: [id number] Age: 60 Admit Type: Outpatient Procedure:                Upper GI endoscopy Indications:              Dysphagia, Odynophagia Providers:                Norvel Richards, MD, Lurline Del, RN, Casimer Bilis, Technician Referring MD:              Medicines:                Propofol per Anesthesia Complications:            No immediate complications. Estimated Blood Loss:     Estimated blood loss was minimal. Estimated blood                            loss was minimal. Procedure:                Pre-Anesthesia Assessment:                           - Prior to the procedure, a History and Physical                            was performed, and patient medications and                            allergies were reviewed. The patient's tolerance of                            previous anesthesia was also reviewed. The risks                            and benefits of the procedure and the sedation                            options and risks were discussed with the patient.                            All questions were answered, and informed consent                            was obtained. Prior Anticoagulants: The patient has                            taken no previous anticoagulant or antiplatelet                            agents. ASA Grade Assessment: III - A patient with  severe systemic disease. After reviewing the risks                            and benefits, the patient was deemed in                            satisfactory condition to undergo the procedure.                           After obtaining informed consent, the endoscope was                            passed under direct vision. Throughout the                            procedure, the  patient's blood pressure, pulse, and                            oxygen saturations were monitored continuously. The                            [id number]-[id number] ([id number]) scope was introduced through the                            mouth, and advanced to the second part of duodenum.                            The upper GI endoscopy was accomplished without                            difficulty. The patient tolerated the procedure                            well. Scope In: 10:28:14 AM Scope Out: 10:34:00 PM Total Procedure Duration: 12 hours 5 minutes 46 seconds  Findings:      The examined esophagus was normal.      Multiple localized erosions with no stigmata of recent bleeding were       found in the gastric antrum. No ulcer or infiltrating process seen.       Patent pylorus.      The duodenal bulb and second portion of the duodenum were normal. The       scope was withdrawn. Dilation was performed with a Maloney dilator with       mild resistance at 91 Fr. The dilation site was examined following       endoscope reinsertion and showed no change. Estimated blood loss: none.       Finally, biopsies were taken with a cold forceps for histology.       Verification of patient identification for the specimen was done by the       nurse. Estimated blood loss was minimal. Impression:               - Normal esophagus. Dilated.                           -  Erosive gastropathy with no stigmata of recent                            bleeding. Biopsied.                           - Normal duodenal bulb and second portion of the                            duodenum.                           - Recent significant bump in transaminases noted.                            Steady improvement observed which is reassuring.                            Scenario suspicious for an ischemic event although                            transient biliary obstruction                            (stone/microlithiasis) not excluded.  Also,                            nonhepatic component of aminotransferase elevation                            not excluded. Moderate Sedation:      Moderate (conscious) sedation was personally administered by an       anesthesia professional. The following parameters were monitored: oxygen       saturation, heart rate, blood pressure, respiratory rate, EKG, adequacy       of pulmonary ventilation, and response to care. Recommendation:           - Patient has a contact number available for                            emergencies. The signs and symptoms of potential                            delayed complications were discussed with the                            patient. Return to normal activities tomorrow.                            Written discharge instructions were provided to the                            patient.                           - Resume previous diet.                           -  Continue present medications.                           - Await pathology results. Dysphagia 3 diet.                           - Return to my office (date not yet determined).                            Stop Carafate. Stop twice daily PPI; once daily                            should suffice. No further hepatic evaluation other                            than following LFTs back down to normal. Procedure Code(s):        --- Professional ---                           [phone], Esophagogastroduodenoscopy, flexible,                            transoral; with biopsy, single or multiple                           43450, Dilation of esophagus, by unguided sound or                            bougie, single or multiple passes Diagnosis Code(s):        --- Professional ---                           K31.89, Other diseases of stomach and duodenum                           R13.10, Dysphagia, unspecified CPT copyright 2019 American Medical Association. All rights reserved. The codes documented in this report  are preliminary and upon coder review may  be revised to meet current compliance requirements. Cristopher Estimable. Shanaya Schneck, MD Norvel Richards, MD 05/19/2021 10:57:54 AM This report has been signed electronically. Number of Addenda: 0

## 2021-05-19 NOTE — Anesthesia Preprocedure Evaluation (Addendum)
Anesthesia Evaluation  Patient identified by MRN, date of birth, ID band Patient awake and Patient confused    Reviewed: Allergy & Precautions, NPO status , Patient's Chart, lab work & pertinent test results  History of Anesthesia Complications (+) Family history of anesthesia reaction  Airway Mallampati: II  TM Distance: >3 FB Neck ROM: Full    Dental  (+) Edentulous Upper, Edentulous Lower   Pulmonary pneumonia, COPD, Current Smoker and Patient abstained from smoking.,    Pulmonary exam normal breath sounds clear to auscultation       Cardiovascular Exercise Tolerance: Poor hypertension, Pt. on medications Normal cardiovascular exam Rhythm:Regular Rate:Normal  1. Left ventricular ejection fraction, by estimation, is 60 to 65%. The left ventricle has normal function. The left ventricle has no regional wall motion abnormalities. There is mild left ventricular hypertrophy. Left ventricular diastolic parameters are consistent with Grade I diastolic dysfunction (impaired relaxation).  2. Right ventricular systolic function is mildly reduced. The right ventricular size is moderately enlarged. There is normal pulmonary artery systolic pressure.  3. The mitral valve is normal in structure. No evidence of mitral valve regurgitation. No evidence of mitral stenosis.  4. The aortic valve is tricuspid. Aortic valve regurgitation is not visualized. No aortic stenosis is present.  5. The inferior vena cava is normal in size with greater than 50% respiratory variability, suggesting right atrial pressure of 3 mmHg.    Neuro/Psych PSYCHIATRIC DISORDERS Schizophrenia    GI/Hepatic negative GI ROS, Neg liver ROS,   Endo/Other  diabetes  Renal/GU Renal InsufficiencyRenal disease     Musculoskeletal   Abdominal   Peds  Hematology  (+) HIV,   Anesthesia Other Findings   Reproductive/Obstetrics                             Anesthesia Physical Anesthesia Plan  ASA: 4  Anesthesia Plan: General   Post-op Pain Management:    Induction: Intravenous  PONV Risk Score and Plan: Propofol infusion  Airway Management Planned: Nasal Cannula and Natural Airway  Additional Equipment:   Intra-op Plan:   Post-operative Plan:   Informed Consent: I have reviewed the patients History and Physical, chart, labs and discussed the procedure including the risks, benefits and alternatives for the proposed anesthesia with the patient or authorized representative who has indicated his/her understanding and acceptance.       Plan Discussed with: CRNA and Surgeon  Anesthesia Plan Comments:        Anesthesia Quick Evaluation

## 2021-05-19 NOTE — Progress Notes (Signed)
Patient seen and examined in short stay.  Stable overnight.  Here for EGD with possible esophageal dilation, biopsy, etc. as feasible/appropriate per plan. Further recommendations to follow after endoscopic evaluation has been carried out.

## 2021-05-19 NOTE — TOC Progression Note (Signed)
Transition of Care [facility]) - Progression Note    Patient Details  Name: Karina Clarke MRN: [id number] Date of Birth: [birth year]  Transition of Care [facility]) CM/SW Contact  Natasha Bence, LCSW Phone Number: 05/19/2021, 5:11 PM  Clinical Narrative:    CSW was able to contact [name]. [name] reported that he did not receive the FL2 until he returned to [facility] that evening after hours. Mr. Huel Cote also reported that due to change in patient's medication change, he will not be able to take patient until Monday. Patient will need EMS. TOC to follow.    Expected Discharge Plan: [place] Barriers to Discharge: Continued Medical Work up  Expected Discharge Plan and Services Expected Discharge Plan: [facility]         Expected Discharge Date: 05/18/21                             Date [place]: 05/17/21 Time [place]: [place] Representative spoke with at [place]: [place] ([place]) Interventions    Readmission Risk Interventions Readmission Risk Prevention Plan 05/17/2021 05/15/2021  Post Dischage Appt - Complete  Medication Screening - Complete  Transportation Screening Complete Complete  HRI or Home Care Consult Complete -  Social Work Consult for [place] Planning/Counseling Complete -  Palliative Care Screening Not Applicable -  Medication Review Press photographer) Complete -  Some recent data might be hidden

## 2021-05-19 NOTE — Progress Notes (Addendum)
PROGRESS NOTE    Patient: Karina Clarke                            PCP: Rosita Fire, MD                    DOB: [birth year]            DOA: 05/15/2021 QMV:[id number]             DOS: 05/19/2021, 1:10 PM   LOS: 4 days   Date of Service: The patient was seen and examined on 05/19/2021  Subjective:  The patient was seen and examined, mentation has improved, much more awake, able to communicate but still having very poor insight... N.p.o. for EGD today (Extensive swallowing difficulty on this admission GI consulted evaluating for possible esophageal stricture)   Brief Narrative:   Tian Davison is a 60 y.o. female with medical history significant for schizophrenia, HIV, COPD.  Patient was brought from nursing home with reports of unresponsiveness, with possible overdose.  She was given some Narcan prior to arrival as well as in the ED with some improvement in responsiveness.  She has been admitted with acute metabolic encephalopathy likely multifactorial in the setting of pneumonia, azotemia/AKI, and elevated liver enzymes.  GI consulted to assist with evaluation of liver injury with labs showing signs of improvement   Assessment & Plan:   Active Problems:   HIV (human immunodeficiency virus infection) ([place])   Hospital-acquired pneumonia   Acute respiratory failure with hypoxia (H[facility])   Diabetes mellitus, new onset ([place])   Chronic paranoid schizophrenia ([place])   Transaminitis   Hypomagnesemia   Hypoalbuminemia   Essential hypertension   Dysphagia    Acute toxic metabolic encephalopathy-multifactorial;  -Mentation is much improved from admission, able to communicate now still having very poor insight, At times pleasantly confused -Nursing staff back at baseline - -Likely related to metabolic derangements that are also showing signs of improvement -IVF - will be on dysphagia 3 -per speech -recommended GI evaluation -Head CT without acute findings -Continue to treat  sepsis -UDS only positive for benzodiazepines with Xanax prescription outpatient -PT evaluation >>> recommending SNF   Abdominal pain/dysphagia  CT abdomen 05/17/2021 reviewed no for any acute findings -Recommended dysphagia 3 diet -Speech evaluated, recommending GI consultation for possible strictures -As needed analgesics, for abdominal pain -GI consulted, pending for EGD today Evaluate for possible esophageal stricture,  Addendum status post EGD 05/19/2021 impression - Normal esophagus. Dilated. - Erosive gastropathy with no stigmata of recent bleeding. Biopsied. - Normal duodenal bulb and second portion of the duodenum.  - Recent significant bump in transaminases noted. Steady improvement observed which is reassuring.  Scenario suspicious for an ischemic event although transient biliary obstruction (stone/microlithiasis) not excluded. Also,  nonhepatic component of aminotransferase elevation not excluded.     AKI-improving -Resolved responded to IV fluids   Severe sepsis secondary to pneumonia and also possible UTI -Continue IV Unasyn and discontinue further azithromycin >>> switch to p.o. Levaquin -Urine cultures >>  no growth -Blood cultures >>> NGTD   Transaminitis -improving -Possibly due to drug-induced liver injury versus ischemia vs sepsis -Appreciate ongoing GI evaluation -Continue to follow LFTs AST/ALT/ ... Trending down -Stable, avoiding hepatotoxins   Hypernatremia-resolved -Responded to IV fluids   Elevated troponin -2D echocardiogram 60-65% EF with grade 1 diastolic dysfunction   Thrombocytopenia-resolved -Likely related to sepsis -Continue to monitor trend -Avoid heparin agents   HIV -  HIV viral load not detected 12/2020 with stable CD4 count -Hold medications for now   Schizophrenia -Resume Haldol, Depakote, Xanax -Discontinued the rest of her medication due to causing encephalopathy    Hypertension-stable   Type 2 diabetes-with  hypoglycemia -Hemoglobin A1c 6.0% -Discontinue D5 half-normal saline, reducing Lantus (10 units nightly) CBG QA CHS, SSI coverage     --------------------------------------------------------------------------------------------------------------------------------------------------- Cultures; Urine cultures 05/16/2021 >> NGTD  Antimicrobials: P.o. Levaquin  Consultants: None   ------------------------------------------------------------------------------------------------------------------------------------------------  DVT prophylaxis:  SCD/Compression stockings Code Status:   Code Status: Full Code  Family Communication: No family member present at bedside- attempt will be made to update daily The above findings and plan of care has been discussed with patient (and family)  in detail,  they expressed understanding and agreement of above. -Advance care planning has been discussed.   Admission status:   Status is: Inpatient  Remains inpatient appropriate because:Inpatient level of care appropriate due to severity of illness  Dispo: The patient is from: SNF               Anticipated d/c is to: SNF in 1 day               Patient currently is not medically stable to d/c.   Difficult to place patient No      Level of care: Med-Surg   Procedures:   No admission procedures for hospital encounter.    Antimicrobials:  Anti-infectives (From admission, onward)    Start     Dose/Rate Route Frequency Ordered Stop   05/18/21 1000  abacavir-lamiVUDine (EPZICOM) 600-300 MG per tablet 1 tablet        1 tablet Oral Daily 05/18/21 0909     05/18/21 1000  atazanavir (REYATAZ) capsule 400 mg        400 mg Oral Daily with breakfast 05/18/21 0909     05/18/21 0000  levofloxacin (LEVAQUIN) 750 MG tablet        750 mg Oral Daily 05/17/21 1427 05/23/21 2359   05/17/21 1000  levofloxacin (LEVAQUIN) tablet 750 mg        750 mg Oral Daily 05/17/21 0813     05/15/21 2130  azithromycin  (ZITHROMAX) 500 mg in sodium chloride 0.9 % 250 mL IVPB  Status:  Discontinued        500 mg 250 mL/hr over 60 Minutes Intravenous Every 24 hours 05/15/21 2127 05/17/21 0813   05/15/21 2100  cefTRIAXone (ROCEPHIN) 1 g in sodium chloride 0.9 % 100 mL IVPB  Status:  Discontinued        1 g 200 mL/hr over 30 Minutes Intravenous Every 24 hours 05/15/21 2053 05/17/21 0813        Medication:   abacavir-lamiVUDine  1 tablet Oral Daily   ALPRAZolam  0.25 mg Oral TID   atazanavir  400 mg Oral Q breakfast   atorvastatin  10 mg Oral Daily   Chlorhexidine Gluconate Cloth  6 each Topical Daily   divalproex  500 mg Oral 2 times per day   enoxaparin (LOVENOX) injection  40 mg Subcutaneous Q24H   feeding supplement  237 mL Oral BID BM   guaiFENesin-dextromethorphan  10 mL Oral BID   haloperidol  10 mg Oral TID   insulin aspart  0-6 Units Subcutaneous Q4H   insulin detemir  10 Units Subcutaneous QHS   lactobacillus  1 g Oral TID WC   levofloxacin  750 mg Oral Daily   magnesium oxide  400 mg Oral q morning  metoprolol tartrate  25 mg Oral BID WC   pantoprazole  40 mg Oral Daily   trihexyphenidyl  2 mg Oral BID WC       Objective:   Vitals:   05/19/21 1047 05/19/21 1049 05/19/21 1100 05/19/21 1117  BP: 132/75  140/81 (!) 144/74  Pulse: 62  63 79  Resp: 16  14   Temp: 97.6 F (36.4 C)     TempSrc:      SpO2: 96% 98% 97% 93%  Weight:      Height:        Intake/Output Summary (Last 24 hours) at 05/19/2021 1310 Last data filed at 05/19/2021 1104 Gross per 24 hour  Intake 1060 ml  Output 350 ml  Net 710 ml   Filed Weights   05/15/21 1724 05/16/21 0946  Weight: 98.3 kg 98.6 kg     Examination:        Physical Exam:   General:  Awake able to communicate poor insight  HEENT:  Normocephalic, PERRL, otherwise with in Normal limits   Neuro:  CNII-XII intact. , normal motor and sensation, reflexes intact   Lungs:   Clear to auscultation BL, Respirations unlabored, no  wheezes / crackles  Cardio:    S1/S2, RRR, No murmure, No Rubs or Gallops   Abdomen:   Soft, non-tender, bowel sounds active all four quadrants,  no guarding or peritoneal signs.  Muscular skeletal:  Severe generalized weakness, bedbound, moving all extremities in bed spontaneously,  Limited exam - in bed,  2+ pulses,  symmetric, No pitting edema  Skin:  Dry, warm to touch, negative for any Rashes,  Wounds: Please see nursing documentation            ------------------------------------------------------------------------------------------------------------------------------------------    LABs:  CBC Latest Ref Rng & Units 05/18/2021 05/17/2021 05/16/2021  WBC 4.0 - 10.5 K/uL 12.8(H) 10.2 6.0  Hemoglobin 12.0 - 15.0 g/dL 10.8(L) 11.2(L) 10.9(L)  Hematocrit 36.0 - 46.0 % 33.7(L) 34.0(L) 33.7(L)  Platelets 150 - 400 K/uL 252 228 176   CMP Latest Ref Rng & Units 05/18/2021 05/17/2021 05/16/2021  Glucose 70 - 99 mg/dL 67(L) 101(H) 92  BUN 6 - 20 mg/dL 16 14 14   Creatinine 0.44 - 1.00 mg/dL 0.64 0.66 0.64  Sodium 135 - 145 mmol/L 142 140 143  Potassium 3.5 - 5.1 mmol/L 3.7 3.6 3.8  Chloride 98 - 111 mmol/L 109 108 111  CO2 22 - 32 mmol/L 27 25 26   Calcium 8.9 - 10.3 mg/dL 8.7(L) 8.7(L) 8.6(L)  Total Protein 6.5 - 8.1 g/dL 6.1(L) - 5.6(L)  Total Bilirubin 0.3 - 1.2 mg/dL 0.5 - 0.5  Alkaline Phos 38 - 126 U/L 63 - 53  AST 15 - 41 U/L 105(H) - 151(H)  ALT 0 - 44 U/L 108(H) - 108(H)       Micro Results Recent Results (from the past 240 hour(s))  Blood culture (routine single)     Status: None   Collection Time: 05/10/21 12:31 PM   Specimen: BLOOD  Result Value Ref Range Status   Specimen Description BLOOD LEFT ANTECUBITAL  Final   Special Requests   Final    BOTTLES DRAWN AEROBIC AND ANAEROBIC Blood Culture adequate volume   Culture   Final    NO GROWTH 5 DAYS Performed at [facility], [address] [place], [place] [address]    Report Status 05/15/2021 FINAL  Final   Urine culture     Status: None   Collection Time: 05/10/21 12:31 PM  Specimen: In/Out Cath Urine  Result Value Ref Range Status   Specimen Description   Final    IN/OUT CATH URINE Performed at [facility], [address] [place], [place] [address]    Special Requests   Final    NONE Performed at [facility], [address] [place], [place] [address]    Culture   Final    NO GROWTH Performed at [facility], [place] [address], [place], [place] [address]    Report Status 05/12/2021 FINAL  Final  Resp Panel by RT-PCR (Flu A&B, Covid) Nasopharyngeal Swab     Status: None   Collection Time: 05/10/21  2:58 PM   Specimen: Nasopharyngeal Swab; Nasopharyngeal(NP) swabs in vial transport medium  Result Value Ref Range Status   SARS Coronavirus 2 by RT PCR NEGATIVE NEGATIVE Final    Comment: (NOTE) SARS-CoV-2 target nucleic acids are NOT DETECTED.  The SARS-CoV-2 RNA is generally detectable in upper respiratory specimens during the acute phase of infection. The lowest concentration of SARS-CoV-2 viral copies this assay can detect is 138 copies/mL. A negative result does not preclude SARS-Cov-2 infection and should not be used as the sole basis for treatment or other patient management decisions. A negative result may occur with  improper specimen collection/handling, submission of specimen other than nasopharyngeal swab, presence of viral mutation(s) within the areas targeted by this assay, and inadequate number of viral copies(<138 copies/mL). A negative result must be combined with clinical observations, patient history, and epidemiological information. The expected result is Negative.  Fact Sheet for Patients:  EntrepreneurPulse.com.au  Fact Sheet for Healthcare Providers:  IncredibleEmployment.be  This test is no t yet approved or cleared by the [place] FDA and  has been authorized for detection and/or diagnosis of SARS-CoV-2  by FDA under an Emergency Use Authorization (EUA). This EUA will remain  in effect (meaning this test can be used) for the duration of the COVID-19 declaration under Section 564(b)(1) of the Act, 21 U.S.C.section 360bbb-3(b)(1), unless the authorization is terminated  or revoked sooner.       Influenza A by PCR NEGATIVE NEGATIVE Final   Influenza B by PCR NEGATIVE NEGATIVE Final    Comment: (NOTE) The Xpert Xpress SARS-CoV-2/FLU/RSV plus assay is intended as an aid in the diagnosis of influenza from Nasopharyngeal swab specimens and should not be used as a sole basis for treatment. Nasal washings and aspirates are unacceptable for Xpert Xpress SARS-CoV-2/FLU/RSV testing.  Fact Sheet for Patients: EntrepreneurPulse.com.au  Fact Sheet for Healthcare Providers: IncredibleEmployment.be  This test is not yet approved or cleared by the [place] FDA and has been authorized for detection and/or diagnosis of SARS-CoV-2 by FDA under an Emergency Use Authorization (EUA). This EUA will remain in effect (meaning this test can be used) for the duration of the COVID-19 declaration under Section 564(b)(1) of the Act, 21 U.S.C. section 360bbb-3(b)(1), unless the authorization is terminated or revoked.  Performed at [facility], [address] [place], [place] [address]   MRSA Next Gen by PCR, Nasal     Status: None   Collection Time: 05/11/21 12:45 PM   Specimen: Nasal Mucosa; Nasal Swab  Result Value Ref Range Status   MRSA by PCR Next Gen NOT DETECTED NOT DETECTED Final    Comment: (NOTE) The GeneXpert MRSA Assay (FDA approved for NASAL specimens only), is one component of a comprehensive MRSA colonization surveillance program. It is not intended to diagnose MRSA infection nor to guide or monitor treatment for  MRSA infections. Test performance is not FDA approved in patients less than 36 years old. Performed at [facility], [address]  [place], [place] [address]   SARS CORONAVIRUS 2 (TAT 6-24 HRS) Nasopharyngeal Nasopharyngeal Swab     Status: None   Collection Time: 05/15/21  8:50 PM   Specimen: Nasopharyngeal Swab  Result Value Ref Range Status   SARS Coronavirus 2 NEGATIVE NEGATIVE Final    Comment: (NOTE) SARS-CoV-2 target nucleic acids are NOT DETECTED.  The SARS-CoV-2 RNA is generally detectable in upper and lower respiratory specimens during the acute phase of infection. Negative results do not preclude SARS-CoV-2 infection, do not rule out co-infections with other pathogens, and should not be used as the sole basis for treatment or other patient management decisions. Negative results must be combined with clinical observations, patient history, and epidemiological information. The expected result is Negative.  Fact Sheet for Patients: SugarRoll.be  Fact Sheet for Healthcare Providers: https://www.woods-mathews.com/  This test is not yet approved or cleared by the [place] FDA and  has been authorized for detection and/or diagnosis of SARS-CoV-2 by FDA under an Emergency Use Authorization (EUA). This EUA will remain  in effect (meaning this test can be used) for the duration of the COVID-19 declaration under Se ction 564(b)(1) of the Act, 21 U.S.C. section 360bbb-3(b)(1), unless the authorization is terminated or revoked sooner.  Performed at [facility], [place] [address] [place], [place] [address]   Urine Culture     Status: None   Collection Time: 05/16/21  9:46 AM   Specimen: Urine, Clean Catch  Result Value Ref Range Status   Specimen Description   Final    URINE, CLEAN CATCH Performed at [facility], [address], [place], [place] [address]    Special Requests   Final    NONE Performed at [facility], [address], [place], [place] [address]    Culture   Final    NO GROWTH Performed at [facility], [place] [address] [place],  [place] [address]    Report Status 05/18/2021 FINAL  Final    Radiology Reports CT ABDOMEN PELVIS WO CONTRAST  Result Date: 05/10/2021 CLINICAL DATA:  Lethargy. Abdominal pain. Elevated liver enzymes. Acute kidney injury. Altered mental status. History of umbilical hernia. EXAM: CT ABDOMEN AND PELVIS WITHOUT CONTRAST TECHNIQUE: Multidetector CT imaging of the abdomen and pelvis was performed following the standard protocol without IV contrast. COMPARISON:  None. FINDINGS: Lower chest: Hypoventilatory changes at the dependent lung bases. Coronary atherosclerosis. Hepatobiliary: Normal liver size. No liver mass. Normal gallbladder with no radiopaque cholelithiasis. No biliary ductal dilatation. Pancreas: Normal, with no mass or duct dilation. Spleen: Normal size. No mass. Adrenals/Urinary Tract: Normal adrenals. No renal stones. No hydronephrosis. No contour deforming renal masses. Bladder collapsed by indwelling Foley catheter and grossly normal. Stomach/Bowel: Normal non-distended stomach. Normal caliber small bowel with no small bowel wall thickening. Normal appendix. Normal large bowel with no diverticulosis, large bowel wall thickening or pericolonic fat stranding. Vascular/Lymphatic: Atherosclerotic nonaneurysmal abdominal aorta. No pathologically enlarged lymph nodes in the abdomen or pelvis. Reproductive: Grossly normal uterus.  No adnexal mass. Other: No pneumoperitoneum. Trace free fluid in the pelvic cul-de-sac. No focal fluid collections. No evidence of a ventral abdominal hernia. Musculoskeletal: No aggressive appearing focal osseous lesions. IMPRESSION: 1. No acute abnormality. No hydronephrosis. No evidence of bowel obstruction or acute bowel inflammation. Normal appendix. 2. Trace free fluid in the pelvic cul-de-sac, nonspecific. No adnexal masses. 3.  Coronary atherosclerosis. 4. Aortic Atherosclerosis (ICD10-I70.0). Electronically Signed   By: Ilona Sorrel M.D.   On: 05/10/2021 18:39   CT ABDOMEN WO  CONTRAST  Result Date: 05/17/2021 CLINICAL DATA:  Abdominal pain, possible recent overdose EXAM: CT ABDOMEN WITHOUT CONTRAST TECHNIQUE: Multidetector CT imaging of the abdomen was performed following the standard protocol without IV contrast. COMPARISON:  05/10/2021 FINDINGS: Lower chest: Mild basilar atelectasis is seen although improved when compared with the prior exam. Hepatobiliary: No focal liver abnormality is seen. No gallstones, gallbladder wall thickening, or biliary dilatation. Pancreas: Unremarkable. No pancreatic ductal dilatation or surrounding inflammatory changes. Spleen: Normal in size without focal abnormality. Adrenals/Urinary Tract: Adrenal glands are within normal limits. Kidneys demonstrate no renal calculi or obstructive changes. Visualized ureters are unremarkable. Stomach/Bowel: Visualized bowel structures show no acute abnormality. Vascular/Lymphatic: Atherosclerotic calcifications of the aorta are seen. Other: No free fluid is seen. Musculoskeletal: No acute or significant osseous findings. IMPRESSION: Mild bibasilar atelectasis although improved when compared with the prior study. No other focal abnormality is seen. Electronically Signed   By: Inez Catalina M.D.   On: 05/17/2021 18:36   DG Chest 1 View  Result Date: 05/15/2021 CLINICAL DATA:  60 year old female with altered mental status. EXAM: CHEST  1 VIEW COMPARISON:  Chest radiograph dated 05/10/2021. FINDINGS: There is cardiomegaly with vascular congestion. Bibasilar densities represent atelectasis or infiltrate. There is no pleural effusion pneumothorax. Atherosclerotic calcification of the aortic arch. No acute osseous pathology. IMPRESSION: 1. Cardiomegaly with vascular congestion. 2. Bibasilar atelectasis or infiltrate. Electronically Signed   By: Anner Crete M.D.   On: 05/15/2021 18:13   CT Head Wo Contrast  Result Date: 05/15/2021 CLINICAL DATA:  Mental status changes. Discharge 2 hours ago after hospitalization  for altered mental status and urinary tract infection. EXAM: CT HEAD WITHOUT CONTRAST TECHNIQUE: Contiguous axial images were obtained from the base of the skull through the vertex without intravenous contrast. COMPARISON:  05/10/21 FINDINGS: Brain: No evidence of acute infarction, hemorrhage, hydrocephalus, extra-axial collection or mass lesion/mass effect. There are patchy low-attenuation areas within the subcortical and periventricular white matter compatible with chronic microvascular disease. Vascular: No hyperdense vessel or unexpected calcification. Skull: Normal. Negative for fracture or focal lesion. Sinuses/Orbits: Air-fluid levels identified within the maxillary sinuses. Mild diffuse mucosal thickening with bubbly opacification of the sphenoid sinus and ethmoid air cells. Mastoid air cells are clear. Other: None IMPRESSION: 1. No acute intracranial abnormalities. 2. Chronic small vessel ischemic disease. 3. Acute on chronic sinus inflammation. Electronically Signed   By: Kerby Moors M.D.   On: 05/15/2021 19:46   CT Head Wo Contrast  Result Date: 05/10/2021 CLINICAL DATA:  Unresponsive prior to arrival. Question intracranial hemorrhage. EXAM: CT HEAD WITHOUT CONTRAST TECHNIQUE: Contiguous axial images were obtained from the base of the skull through the vertex without intravenous contrast. COMPARISON:  None. FINDINGS: Brain: Brainstem appears normal. No focal cerebellar finding. Cerebral hemispheres show moderate chronic small-vessel ischemic changes of the white matter. No sign of acute infarction, mass lesion, hemorrhage, hydrocephalus or extra-axial collection. Vascular: There is atherosclerotic calcification of the major vessels at the base of the brain. Skull: Negative Sinuses/Orbits: Clear/normal Other: None IMPRESSION: No acute finding by CT. Moderate chronic small-vessel ischemic changes of the cerebral hemispheric white matter. Electronically Signed   By: Nelson Chimes M.D.   On: 05/10/2021  13:56   DG Chest Port 1 View  Result Date: 05/10/2021 CLINICAL DATA:  Unresponsive with concern for drug overdose EXAM: PORTABLE CHEST 1 VIEW COMPARISON:  January 27, 2018 chest radiograph. Chest CT October 17, 2017 FINDINGS: There are areas of ill-defined airspace opacity in right mid and lower lung regions. Lungs elsewhere are clear. Heart is upper normal in size, stable, with prominence of central pulmonary arteries, consistent with pulmonary arterial hypertension. No adenopathy appreciable. No bone lesions IMPRESSION: Areas of ill-defined opacity right mid and lower lung regions. Suspect early pneumonia or possible foci of aspiration. Lungs elsewhere clear. Heart upper normal in size with evidence of pulmonary arterial hypertension. Note that pulmonary arterial hypertension was present on prior CT examination. Electronically Signed   By: Lowella Grip III M.D.   On: 05/10/2021 13:23   ECHOCARDIOGRAM COMPLETE  Result Date: 05/11/2021    ECHOCARDIOGRAM REPORT   Patient Name:   SHANYA FERRISS Date of Exam: 05/11/2021 Medical Rec #:  [id number]      Height:       68.0 in Accession #:    [id number]     Weight:       207.0 lb Date of Birth:  Jun 12, 1961      BSA:          2.074 m Patient Age:    79 years       BP:           110/69 mmHg Patient Gender: F              HR:           100 bpm. Exam Location:  [facility] Procedure: 2D Echo, Cardiac Doppler and Color Doppler Indications:    Elevated Troponin  History:        Patient has prior history of Echocardiogram examinations, most                 recent 10/17/2017. COPD; Signs/Symptoms:Altered Mental Status.                 Sepsis, elevated Troponin, schizophrenia, HIV, pneumonia.  Sonographer:    Dustin Flock RDCS Referring Phys: [place]  1. Left ventricular ejection fraction, by estimation, is 60 to 65%. The left ventricle has normal function. The left ventricle has no regional wall motion abnormalities. There is mild left  ventricular hypertrophy. Left ventricular diastolic parameters are consistent with Grade I diastolic dysfunction (impaired relaxation).  2. Right ventricular systolic function is mildly reduced. The right ventricular size is moderately enlarged. There is normal pulmonary artery systolic pressure.  3. The mitral valve is normal in structure. No evidence of mitral valve regurgitation. No evidence of mitral stenosis.  4. The aortic valve is tricuspid. Aortic valve regurgitation is not visualized. No aortic stenosis is present.  5. The inferior vena cava is normal in size with greater than 50% respiratory variability, suggesting right atrial pressure of 3 mmHg. FINDINGS  Left Ventricle: Left ventricular ejection fraction, by estimation, is 60 to 65%. The left ventricle has normal function. The left ventricle has no regional wall motion abnormalities. The left ventricular internal cavity size was normal in size. There is  mild left ventricular hypertrophy. Left ventricular diastolic parameters are consistent with Grade I diastolic dysfunction (impaired relaxation). Normal left ventricular filling pressure. Right Ventricle: The right ventricular size is moderately enlarged. Right vetricular wall thickness was not assessed. Right ventricular systolic function is mildly reduced. There is normal pulmonary artery systolic pressure. The tricuspid regurgitant velocity is 2.45 m/s, and with an assumed right atrial pressure of 3 mmHg, the estimated right ventricular systolic pressure is 12.4 mmHg. Left Atrium:  Left atrial size was normal in size. Right Atrium: Right atrial size was normal in size. Pericardium: There is no evidence of pericardial effusion. Mitral Valve: The mitral valve is normal in structure. No evidence of mitral valve regurgitation. No evidence of mitral valve stenosis. Tricuspid Valve: The tricuspid valve is not well visualized. Tricuspid valve regurgitation is trivial. No evidence of tricuspid stenosis.  Aortic Valve: The aortic valve is tricuspid. Aortic valve regurgitation is not visualized. No aortic stenosis is present. Aortic valve mean gradient measures 5.2 mmHg. Aortic valve peak gradient measures 10.0 mmHg. Aortic valve area, by VTI measures 2.35  cm. Pulmonic Valve: The pulmonic valve was not well visualized. Pulmonic valve regurgitation is not visualized. No evidence of pulmonic stenosis. Aorta: The aortic root is normal in size and structure. Pulmonary Artery: Indeterminant PASP, inadequate TR jet. Venous: The inferior vena cava is normal in size with greater than 50% respiratory variability, suggesting right atrial pressure of 3 mmHg. IAS/Shunts: No atrial level shunt detected by color flow Doppler.  LEFT VENTRICLE PLAX 2D LVIDd:         3.63 cm  Diastology LVIDs:         2.39 cm  LV e' medial:    5.66 cm/s LV PW:         1.16 cm  LV E/e' medial:  9.8 LV IVS:        1.13 cm  LV e' lateral:   10.00 cm/s LVOT diam:     2.00 cm  LV E/e' lateral: 5.5 LV SV:         57 LV SV Index:   27 LVOT Area:     3.14 cm  RIGHT VENTRICLE RV Basal diam:  3.96 cm RV S prime:     13.20 cm/s TAPSE (M-mode): 2.3 cm LEFT ATRIUM             Index       RIGHT ATRIUM           Index LA diam:        3.50 cm 1.69 cm/m  RA Area:     18.40 cm LA Vol (A2C):   55.9 ml 26.95 ml/m RA Volume:   51.60 ml  24.88 ml/m LA Vol (A4C):   42.8 ml 20.64 ml/m LA Biplane Vol: 51.7 ml 24.93 ml/m  AORTIC VALVE AV Area (Vmax):    2.12 cm AV Area (Vmean):   2.49 cm AV Area (VTI):     2.35 cm AV Vmax:           158.22 cm/s AV Vmean:          107.448 cm/s AV VTI:            0.242 m AV Peak Grad:      10.0 mmHg AV Mean Grad:      5.2 mmHg LVOT Vmax:         107.00 cm/s LVOT Vmean:        85.000 cm/s LVOT VTI:          0.181 m LVOT/AV VTI ratio: 0.75  AORTA Ao Root diam: 2.50 cm MITRAL VALVE               TRICUSPID VALVE MV Area (PHT): 5.46 cm    TR Peak grad:   24.0 mmHg MV Decel Time: 139 msec    TR Vmax:        245.00 cm/s MV E velocity:  55.20 cm/s MV A velocity: 83.70 cm/s  SHUNTS MV E/A ratio:  0.66        Systemic VTI:  0.18 m                            Systemic Diam: 2.00 cm Carlyle Dolly MD Electronically signed by Carlyle Dolly MD Signature Date/Time: 05/11/2021/5:26:32 PM    Final    DG HIP UNILAT WITH PELVIS 2-3 VIEWS RIGHT  Result Date: 05/17/2021 CLINICAL DATA:  Right hip pain and altered mental status, initial encounter EXAM: DG HIP (WITH OR WITHOUT PELVIS) 3V RIGHT COMPARISON:  05/10/2021 CT FINDINGS: Pelvic ring is intact. 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 No discrete fracture is seen. Remodeling of the right femoral head is noted. No soft tissue abnormality is seen. IMPRESSION: Significant degenerative changes right greater than left similar to that seen on prior CT. No acute abnormality noted. Electronically Signed   By: Inez Catalina M.D.   On: 05/17/2021 18:38    SIGNED: Deatra James, MD, FHM. [facility],  Pager (please use [email or web address] to page/text) Please use Epic Secure Chat for non-urgent communication (7AM-7PM)  If 7PM-7AM, please contact night-coverage [email or web address], 05/19/2021, 1:10 PM

## 2021-05-19 NOTE — Anesthesia Postprocedure Evaluation (Signed)
Anesthesia Post Note  Patient: Karina Clarke  Procedure(s) Performed: ESOPHAGOGASTRODUODENOSCOPY (EGD) WITH PROPOFOL  Patient location during evaluation: PACU Anesthesia Type: General Level of consciousness: awake and alert and patient cooperative Pain management: pain level controlled Vital Signs Assessment: post-procedure vital signs reviewed and stable Respiratory status: spontaneous breathing, respiratory function stable and patient connected to nasal cannula oxygen Cardiovascular status: blood pressure returned to baseline and stable Postop Assessment: no apparent nausea or vomiting Anesthetic complications: no   No notable events documented.   Last Vitals:  Vitals:   05/19/21 1047 05/19/21 1049  BP: 132/75   Pulse: 62   Resp: 16   Temp: 36.4 C   SpO2: 96% 98%    Last Pain:  Vitals:   05/19/21 1049  TempSrc:   PainSc: 0-No pain                 Katreena Schupp C Claryce Friel

## 2021-05-19 NOTE — Transfer of Care (Signed)
Immediate Anesthesia Transfer of Care Note  Patient: Karina Clarke  Procedure(s) Performed: ESOPHAGOGASTRODUODENOSCOPY (EGD) WITH PROPOFOL  Patient Location: PACU  Anesthesia Type:General  Level of Consciousness: awake, alert  and patient cooperative  Airway & Oxygen Therapy: Patient Spontanous Breathing and Patient connected to nasal cannula oxygen  Post-op Assessment: Report given to RN and Post -op Vital signs reviewed and stable  Post vital signs: Reviewed and stable  Last Vitals:  Vitals Value Taken Time  BP 132/75 1048  Temp 97.6 1048  Pulse 76 1048  Resp 15 1048  SpO2 98 % 05/19/21 1049    Last Pain:  Vitals:   05/19/21 0452  TempSrc: Oral  PainSc:          Complications: No notable events documented.

## 2021-05-20 LAB — CBC
HCT: 32.7 % — ABNORMAL LOW (ref 36.0–46.0)
Hemoglobin: 10.5 g/dL — ABNORMAL LOW (ref 12.0–15.0)
MCH: 31.3 pg (ref 26.0–34.0)
MCHC: 32.1 g/dL (ref 30.0–36.0)
MCV: 97.6 fL (ref 80.0–100.0)
Platelets: 250 10*3/uL (ref 150–400)
RBC: 3.35 MIL/uL — ABNORMAL LOW (ref 3.87–5.11)
RDW: 15.4 % (ref 11.5–15.5)
WBC: 10.8 10*3/uL — ABNORMAL HIGH (ref 4.0–10.5)
nRBC: 0 % (ref 0.0–0.2)

## 2021-05-20 LAB — BASIC METABOLIC PANEL
Anion gap: 6 (ref 5–15)
BUN: 11 mg/dL (ref 6–20)
CO2: 28 mmol/L (ref 22–32)
Calcium: 8 mg/dL — ABNORMAL LOW (ref 8.9–10.3)
Chloride: 103 mmol/L (ref 98–111)
Creatinine, Ser: 0.72 mg/dL (ref 0.44–1.00)
GFR, Estimated: >60 mL/min (ref >60)
Glucose, Bld: 92 mg/dL (ref 70–99)
Potassium: 3.6 mmol/L (ref 3.5–5.1)
Sodium: 137 mmol/L (ref 135–145)

## 2021-05-20 LAB — GLUCOSE, CAPILLARY
Glucose-Capillary: 102 mg/dL — ABNORMAL HIGH (ref 70–99)
Glucose-Capillary: 106 mg/dL — ABNORMAL HIGH (ref 70–99)
Glucose-Capillary: 107 mg/dL — ABNORMAL HIGH (ref 70–99)
Glucose-Capillary: 79 mg/dL (ref 70–99)
Glucose-Capillary: 83 mg/dL (ref 70–99)
Glucose-Capillary: 95 mg/dL (ref 70–99)

## 2021-05-20 MED ORDER — INSULIN DETEMIR 100 UNIT/ML ~~LOC~~ SOLN: 10.0000 [IU] | Freq: Every day | SUBCUTANEOUS | 11 refills | Status: DC

## 2021-05-20 MED ORDER — METOPROLOL TARTRATE 25 MG PO TABS
12.5000 mg | ORAL_TABLET | Freq: Two times a day (BID) | ORAL | Status: DC
  Administered 2021-05-20 – 2021-05-22 (×3): 12.5 mg via ORAL
  Filled 2021-05-20 (×3): qty 1

## 2021-05-20 MED ORDER — ALPRAZOLAM 0.5 MG PO TABS: 0.5000 mg | ORAL_TABLET | Freq: Two times a day (BID) | ORAL | 0 refills | Status: AC | PRN

## 2021-05-20 MED ORDER — METOPROLOL TARTRATE 25 MG PO TABS: 12.5000 mg | ORAL_TABLET | Freq: Two times a day (BID) | ORAL | 3 refills | Status: DC

## 2021-05-20 MED ORDER — HALOPERIDOL 10 MG PO TABS: 10.0000 mg | ORAL_TABLET | Freq: Three times a day (TID) | ORAL | 0 refills | Status: AC

## 2021-05-20 MED ORDER — DIVALPROEX SODIUM 500 MG PO DR TAB: 500.0000 mg | DELAYED_RELEASE_TABLET | Freq: Two times a day (BID) | ORAL | 0 refills | Status: AC

## 2021-05-20 NOTE — Progress Notes (Signed)
Patient reports no complaints to me this morning.  Tolerating a dysphagia 3 diet denies abdominal pain. Discussed with nursing staff.  Patient has been complaining to Nursing staff all night of lower extremity pain for which she has been getting Dilaudid; Zofran provided.  Vital signs in last 24 hours: Temp:  [97.6 F (36.4 C)-98.1 F (36.7 C)] 97.8 F (36.6 C) (06/26 0325) Pulse Rate:  [44-79] 45 (06/26 0820) Resp:  [14-20] 19 (06/26 0325) BP: (104-155)/(54-81) 104/69 (06/26 0820) SpO2:  [92 %-98 %] 94 % (06/26 0820) Last BM Date: 05/19/21 General:   Alert,   pleasant and cooperative in NAD.  No scleral icterus. Abdomen:  abdomen nondistended.  Soft and nontender.   Extremities:  Without clubbing or edema.    Intake/Output from previous day: 06/25 0701 - 06/26 0700 In: 1076.7 [P.O.:200; I.V.:876.7] Out: 800 [Urine:800] Intake/Output this shift: No intake/output data recorded.  Lab Results: Recent Labs    05/18/21 0416 05/20/21 0657  WBC 12.8* 10.8*  HGB 10.8* 10.5*  HCT 33.7* 32.7*  PLT 252 250   BMET Recent Labs    05/18/21 0416 05/20/21 0657  NA 142 137  K 3.7 3.6  CL 109 103  CO2 27 28  GLUCOSE 67* 92  BUN 16 11  CREATININE 0.64 0.72  CALCIUM 8.7* 8.0*   LFT Recent Labs    05/18/21 0416  PROT 6.1*  ALBUMIN 2.5*  AST 105*  ALT 108*  ALKPHOS 63  BILITOT 0.5  BILIDIR 0.1  IBILI 0.4   PT/INR No results for input(s): LABPROT, INR in the last 72 hours. Hepatitis Panel No results for input(s): HEPBSAG, HCVAB, HEPAIGM, HEPBIGM in the last 72 hours. C-Diff No results for input(s): CDIFFTOX in the last 72 hours.  Studies/Results: No results found.   Impression: 60 year old lady with HIV, multiple other co-morbidities admitted with sepsis syndrome significantly elevated aminotransferases of uncertain etiology (transient ischemia more likely-now markedly improved) along with odynophagia, dysphagia.  EGD yesterday reassuring given paucity of findings.   Empirically dilated.  Gastric mucosa biopsy.  History appears to be somewhat unreliable.  She certainly could be having some nausea related to regular Dilaudid dosing.   Recommendations:  For now,  continue diet /observation.  Protonix 40 mg once daily indefinitely  Follow-up on pathology.  Zofran as needed.  Minimize medications associated with nausea as feasible.  Recheck LFTs this week to assure complete normalization.

## 2021-05-20 NOTE — Progress Notes (Signed)
Pt O2 stat 85, 2 L of O2 put on stat now 97. Will continue to monitor. Pt stable. Denies any other needs at this time. No s/s of pain or discomfort. Call bell p;aced on reach, bed placed on lowest position.

## 2021-05-20 NOTE — TOC Progression Note (Signed)
Transition of Care [facility]) - Progression Note    Patient Details  Name: Maila Dukes MRN: [id number] Date of Birth: 1961/10/02  [facility] of [facility]) CM/SW Contact  Natasha Bence, LCSW Phone Number: 05/20/2021, 5:33 PM  Clinical Narrative:    [facility] not able to [facility] due to narcotics. TOC to folllow.    Expected Discharge Plan: [place] Barriers to Discharge: Continued Medical Work up  Expected Discharge Plan and Services Expected Discharge Plan: [place]         Expected Discharge Date: 05/21/21                             Date [place]: 05/17/21 Time [place]: [place] Representative spoke with at [place]: Romualdo Bolk   Social Determinants of Health ([place]) Interventions    Readmission Risk Interventions Readmission Risk Prevention Plan 05/17/2021 05/15/2021  Post Dischage Appt - Complete  Medication Screening - Complete  Transportation Screening Complete Complete  HRI or Home Care Consult Complete -  Social Work Consult for [place] Planning/Counseling Complete -  Palliative Care Screening Not Applicable -  Medication Review [employer] photographer) Complete -  Some recent data might be hidden

## 2021-05-20 NOTE — Progress Notes (Signed)
PROGRESS NOTE    Patient: Karina Clarke                            PCP: Rosita Fire, MD                    DOB: [birth year]            DOA: 05/15/2021 ZOX:[id number]             DOS: 05/20/2021, 11:36 AM   LOS: 5 days   Date of Service: The patient was seen and examined on 05/20/2021  Subjective:   The patient was seen and examined this morning, awake stable no acute distress verbal. Status post EGD and dilatation tolerated procedure well, tolerating p.o. now.  Overnight was complaining of lower extremity pain...  Last few days hip was x-rayed, lower extremities were evaluated with no obvious signs of deformity Or specific point of tenderness     Brief Narrative:   Nico Rogness is a 60 y.o. female with medical history significant for schizophrenia, HIV, COPD.  Patient was brought from nursing home with reports of unresponsiveness, with possible overdose.  She was given some Narcan prior to arrival as well as in the ED with some improvement in responsiveness.  She has been admitted with acute metabolic encephalopathy likely multifactorial in the setting of pneumonia, azotemia/AKI, and elevated liver enzymes.  GI consulted to assist with evaluation of liver injury with labs showing signs of improvement   Assessment & Plan:   Active Problems:   HIV (human immunodeficiency virus infection) ([place])   Hospital-acquired pneumonia   Acute respiratory failure with hypoxia (H[facility])   Diabetes mellitus, new onset ([place])   Chronic paranoid schizophrenia ([place])   Transaminitis   Hypomagnesemia   Hypoalbuminemia   Essential hypertension   Dysphagia    Acute toxic metabolic encephalopathy-multifactorial;  -Much improved, able to communicate, poor insight - -Likely related to metabolic derangements that are also showing signs of improvement -IVF - will be on dysphagia 3 -per speech -recommended GI evaluation -Head CT without acute findings -Continue to treat sepsis -UDS only positive  for benzodiazepines with Xanax prescription outpatient -PT evaluation >>> recommending SNF   Abdominal pain/dysphagia  CT abdomen 05/17/2021 reviewed no for any acute findings -Recommended dysphagia 3 diet -Speech evaluated, recommending GI consultation for possible strictures -As needed analgesics, for abdominal pain -GI consulted, pending for EGD report as below  Addendum status post EGD 05/19/2021 impression - Normal esophagus. Dilated. - Erosive gastropathy with no stigmata of recent bleeding. Biopsied. - Normal duodenal bulb and second portion of the duodenum.  - Recent significant bump in transaminases noted. Steady improvement observed which is reassuring.  Scenario suspicious for an ischemic event although transient biliary obstruction (stone/microlithiasis) not excluded. Also,  nonhepatic component of aminotransferase elevation not excluded.  Continue Protonix 40 mg daily, follow-up with pathology from biopsy   AKI-improving -Resolved responded to IV fluids   Severe sepsis secondary to pneumonia and also possible UTI -Continue IV Unasyn and discontinue further azithromycin >>> switch to p.o. Levaquin -Urine cultures >>  no growth -Blood cultures >>> NGTD Afebrile normotensive   Transaminitis -monitoring, improving -Possibly due to drug-induced liver injury versus ischemia vs sepsis -Appreciate ongoing GI evaluation -Continue to follow LFTs AST/ALT/ ... Trending down -Stable, avoiding hepatotoxins   Hypernatremia-resolved -Responded to IV fluids..  DC'd   Elevated troponin -2D echocardiogram 60-65% EF with grade 1 diastolic dysfunction   Thrombocytopenia-resolved -  Likely related to sepsis -Continue to monitor trend -Avoid heparin agents   HIV -HIV viral load not detected 12/2020 with stable CD4 count -Hold medications for now   Schizophrenia -Resume Haldol, Depakote, Xanax -Discontinued the rest of her medication due to causing encephalopathy     Hypertension-stable   Type 2 diabetes-with hypoglycemia -Hemoglobin A1c 6.0% -Discontinue D5 half-normal saline, reducing Lantus (10 units nightly) CBG QA CHS, SSI coverage     --------------------------------------------------------------------------------------------------------------------------------------------------- Cultures; Urine cultures 05/16/2021 >> NGTD  Antimicrobials: P.o. Levaquin  Consultants: None   ------------------------------------------------------------------------------------------------------------------------------------------------  DVT prophylaxis:  SCD/Compression stockings Code Status:   Code Status: Full Code  Family Communication: No family member present at bedside- attempt will be made to update daily The above findings and plan of care has been discussed with patient (and family)  in detail,  they expressed understanding and agreement of above. -Advance care planning has been discussed.   Admission status:   Status is: Inpatient  Remains inpatient appropriate because:Inpatient level of care appropriate due to severity of illness  Dispo: The patient is from: SNF               Anticipated d/c is to: SNF in 1 day               Patient currently is not medically stable to d/c.   Difficult to place patient No      Level of care: Med-Surg   Procedures:   No admission procedures for hospital encounter.    Antimicrobials:  Anti-infectives (From admission, onward)    Start     Dose/Rate Route Frequency Ordered Stop   05/18/21 1000  abacavir-lamiVUDine (EPZICOM) 600-300 MG per tablet 1 tablet        1 tablet Oral Daily 05/18/21 0909     05/18/21 1000  atazanavir (REYATAZ) capsule 400 mg        400 mg Oral Daily with breakfast 05/18/21 0909     05/18/21 0000  levofloxacin (LEVAQUIN) 750 MG tablet        750 mg Oral Daily 05/17/21 1427 05/23/21 2359   05/17/21 1000  levofloxacin (LEVAQUIN) tablet 750 mg        750 mg Oral Daily  05/17/21 0813     05/15/21 2130  azithromycin (ZITHROMAX) 500 mg in sodium chloride 0.9 % 250 mL IVPB  Status:  Discontinued        500 mg 250 mL/hr over 60 Minutes Intravenous Every 24 hours 05/15/21 2127 05/17/21 0813   05/15/21 2100  cefTRIAXone (ROCEPHIN) 1 g in sodium chloride 0.9 % 100 mL IVPB  Status:  Discontinued        1 g 200 mL/hr over 30 Minutes Intravenous Every 24 hours 05/15/21 2053 05/17/21 0813        Medication:   abacavir-lamiVUDine  1 tablet Oral Daily   ALPRAZolam  0.25 mg Oral TID   atazanavir  400 mg Oral Q breakfast   atorvastatin  10 mg Oral Daily   Chlorhexidine Gluconate Cloth  6 each Topical Daily   divalproex  500 mg Oral 2 times per day   enoxaparin (LOVENOX) injection  40 mg Subcutaneous Q24H   feeding supplement  237 mL Oral BID BM   guaiFENesin-dextromethorphan  10 mL Oral BID   haloperidol  10 mg Oral TID   insulin aspart  0-6 Units Subcutaneous Q4H   insulin detemir  10 Units Subcutaneous QHS   lactobacillus  1 g Oral TID WC   levofloxacin  750  mg Oral Daily   magnesium oxide  400 mg Oral q morning   metoprolol tartrate  25 mg Oral BID WC   pantoprazole  40 mg Oral Daily   trihexyphenidyl  2 mg Oral BID WC       Objective:   Vitals:   05/19/21 1852 05/19/21 1958 05/20/21 0325 05/20/21 0820  BP: (!) 114/54 118/64 112/65 104/69  Pulse: (!) 44 (!) 52 (!) 58 (!) 45  Resp:  18 19   Temp: 98 F (36.7 C) 98.1 F (36.7 C) 97.8 F (36.6 C)   TempSrc: Oral  Oral   SpO2: 94% 93% 94% 94%  Weight:      Height:        Intake/Output Summary (Last 24 hours) at 05/20/2021 1136 Last data filed at 05/20/2021 0500 Gross per 24 hour  Intake 376.65 ml  Output 800 ml  Net -423.35 ml   Filed Weights   05/15/21 1724 05/16/21 0946  Weight: 98.3 kg 98.6 kg     Examination:        Physical Exam:   General:  Alert, oriented, cooperative, no distress;   HEENT:  Normocephalic, PERRL, otherwise with in Normal limits   Neuro:  CNII-XII  intact. , normal motor and sensation, reflexes intact   Lungs:   Clear to auscultation BL, Respirations unlabored, no wheezes / crackles  Cardio:    S1/S2, RRR, No murmure, No Rubs or Gallops   Abdomen:   Soft, non-tender, bowel sounds active all four quadrants,  no guarding or peritoneal signs.  Muscular skeletal:  Severe generalized weaknesses, bedbound, Limited exam - in bed, able to move all 4 extremities, limited range of motion in lower extremity, hip area due to pain 2+ pulses,  symmetric, No pitting edema  Skin:  Dry, warm to touch, negative for any Rashes,  Wounds: Please see nursing documentation               ------------------------------------------------------------------------------------------------------------------------------------------    LABs:  CBC Latest Ref Rng & Units 05/20/2021 05/18/2021 05/17/2021  WBC 4.0 - 10.5 K/uL 10.8(H) 12.8(H) 10.2  Hemoglobin 12.0 - 15.0 g/dL 10.5(L) 10.8(L) 11.2(L)  Hematocrit 36.0 - 46.0 % 32.7(L) 33.7(L) 34.0(L)  Platelets 150 - 400 K/uL 250 252 228   CMP Latest Ref Rng & Units 05/20/2021 05/18/2021 05/17/2021  Glucose 70 - 99 mg/dL 92 67(L) 101(H)  BUN 6 - 20 mg/dL 11 16 14   Creatinine 0.44 - 1.00 mg/dL 0.72 0.64 0.66  Sodium 135 - 145 mmol/L 137 142 140  Potassium 3.5 - 5.1 mmol/L 3.6 3.7 3.6  Chloride 98 - 111 mmol/L 103 109 108  CO2 22 - 32 mmol/L 28 27 25   Calcium 8.9 - 10.3 mg/dL 8.0(L) 8.7(L) 8.7(L)  Total Protein 6.5 - 8.1 g/dL - 6.1(L) -  Total Bilirubin 0.3 - 1.2 mg/dL - 0.5 -  Alkaline Phos 38 - 126 U/L - 63 -  AST 15 - 41 U/L - 105(H) -  ALT 0 - 44 U/L - 108(H) -       Micro Results Recent Results (from the past 240 hour(s))  Blood culture (routine single)     Status: None   Collection Time: 05/10/21 12:31 PM   Specimen: BLOOD  Result Value Ref Range Status   Specimen Description BLOOD LEFT ANTECUBITAL  Final   Special Requests   Final    BOTTLES DRAWN AEROBIC AND ANAEROBIC Blood Culture adequate  volume   Culture   Final    NO GROWTH  5 DAYS Performed at [facility], [address], [place], [place] [address]    Report Status 05/15/2021 FINAL  Final  Urine culture     Status: None   Collection Time: 05/10/21 12:31 PM   Specimen: In/Out Cath Urine  Result Value Ref Range Status   Specimen Description   Final    IN/OUT CATH URINE Performed at [facility], [address] [place], [place] [address]    Special Requests   Final    NONE Performed at [facility], [address] [place], [place] [address]    Culture   Final    NO GROWTH Performed at [facility], [place] [address], [place], [place] [address]    Report Status 05/12/2021 FINAL  Final  Resp Panel by RT-PCR (Flu A&B, Covid) Nasopharyngeal Swab     Status: None   Collection Time: 05/10/21  2:58 PM   Specimen: Nasopharyngeal Swab; Nasopharyngeal(NP) swabs in vial transport medium  Result Value Ref Range Status   SARS Coronavirus 2 by RT PCR NEGATIVE NEGATIVE Final    Comment: (NOTE) SARS-CoV-2 target nucleic acids are NOT DETECTED.  The SARS-CoV-2 RNA is generally detectable in upper respiratory specimens during the acute phase of infection. The lowest concentration of SARS-CoV-2 viral copies this assay can detect is 138 copies/mL. A negative result does not preclude SARS-Cov-2 infection and should not be used as the sole basis for treatment or other patient management decisions. A negative result may occur with  improper specimen collection/handling, submission of specimen other than nasopharyngeal swab, presence of viral mutation(s) within the areas targeted by this assay, and inadequate number of viral copies(<138 copies/mL). A negative result must be combined with clinical observations, patient history, and epidemiological information. The expected result is Negative.  Fact Sheet for Patients:  EntrepreneurPulse.com.au  Fact Sheet for Healthcare Providers:   IncredibleEmployment.be  This test is no t yet approved or cleared by the [place] FDA and  has been authorized for detection and/or diagnosis of SARS-CoV-2 by FDA under an Emergency Use Authorization (EUA). This EUA will remain  in effect (meaning this test can be used) for the duration of the COVID-19 declaration under Section 564(b)(1) of the Act, 21 U.S.C.section 360bbb-3(b)(1), unless the authorization is terminated  or revoked sooner.       Influenza A by PCR NEGATIVE NEGATIVE Final   Influenza B by PCR NEGATIVE NEGATIVE Final    Comment: (NOTE) The Xpert Xpress SARS-CoV-2/FLU/RSV plus assay is intended as an aid in the diagnosis of influenza from Nasopharyngeal swab specimens and should not be used as a sole basis for treatment. Nasal washings and aspirates are unacceptable for Xpert Xpress SARS-CoV-2/FLU/RSV testing.  Fact Sheet for Patients: EntrepreneurPulse.com.au  Fact Sheet for Healthcare Providers: IncredibleEmployment.be  This test is not yet approved or cleared by the [place] FDA and has been authorized for detection and/or diagnosis of SARS-CoV-2 by FDA under an Emergency Use Authorization (EUA). This EUA will remain in effect (meaning this test can be used) for the duration of the COVID-19 declaration under Section 564(b)(1) of the Act, 21 U.S.C. section 360bbb-3(b)(1), unless the authorization is terminated or revoked.  Performed at [facility], [address] [place], [place] [address]   MRSA Next Gen by PCR, Nasal     Status: None   Collection Time: 05/11/21 12:45 PM   Specimen: Nasal Mucosa; Nasal Swab  Result Value Ref Range Status   MRSA by PCR Next Gen NOT DETECTED NOT DETECTED Final  Comment: (NOTE) The GeneXpert MRSA Assay (FDA approved for NASAL specimens only), is one component of a comprehensive MRSA colonization surveillance program. It is not intended to diagnose MRSA  infection nor to guide or monitor treatment for MRSA infections. Test performance is not FDA approved in patients less than 10 years old. Performed at [facility], [address] [place], [place] [address]   SARS CORONAVIRUS 2 (TAT 6-24 HRS) Nasopharyngeal Nasopharyngeal Swab     Status: None   Collection Time: 05/15/21  8:50 PM   Specimen: Nasopharyngeal Swab  Result Value Ref Range Status   SARS Coronavirus 2 NEGATIVE NEGATIVE Final    Comment: (NOTE) SARS-CoV-2 target nucleic acids are NOT DETECTED.  The SARS-CoV-2 RNA is generally detectable in upper and lower respiratory specimens during the acute phase of infection. Negative results do not preclude SARS-CoV-2 infection, do not rule out co-infections with other pathogens, and should not be used as the sole basis for treatment or other patient management decisions. Negative results must be combined with clinical observations, patient history, and epidemiological information. The expected result is Negative.  Fact Sheet for Patients: SugarRoll.be  Fact Sheet for Healthcare Providers: https://www.woods-mathews.com/  This test is not yet approved or cleared by the [place] FDA and  has been authorized for detection and/or diagnosis of SARS-CoV-2 by FDA under an Emergency Use Authorization (EUA). This EUA will remain  in effect (meaning this test can be used) for the duration of the COVID-19 declaration under Se ction 564(b)(1) of the Act, 21 U.S.C. section 360bbb-3(b)(1), unless the authorization is terminated or revoked sooner.  Performed at [facility], [place] [address], [place], [place] [address]   Urine Culture     Status: None   Collection Time: 05/16/21  9:46 AM   Specimen: Urine, Clean Catch  Result Value Ref Range Status   Specimen Description   Final    URINE, CLEAN CATCH Performed at [facility], [address] [place], [place] [address]    Special Requests    Final    NONE Performed at [facility], [address], [place], [place] [address]    Culture   Final    NO GROWTH Performed at [facility], [place] [address], [place], [place] [address]    Report Status 05/18/2021 FINAL  Final    Radiology Reports CT ABDOMEN PELVIS WO CONTRAST  Result Date: 05/10/2021 CLINICAL DATA:  Lethargy. Abdominal pain. Elevated liver enzymes. Acute kidney injury. Altered mental status. History of umbilical hernia. EXAM: CT ABDOMEN AND PELVIS WITHOUT CONTRAST TECHNIQUE: Multidetector CT imaging of the abdomen and pelvis was performed following the standard protocol without IV contrast. COMPARISON:  None. FINDINGS: Lower chest: Hypoventilatory changes at the dependent lung bases. Coronary atherosclerosis. Hepatobiliary: Normal liver size. No liver mass. Normal gallbladder with no radiopaque cholelithiasis. No biliary ductal dilatation. Pancreas: Normal, with no mass or duct dilation. Spleen: Normal size. No mass. Adrenals/Urinary Tract: Normal adrenals. No renal stones. No hydronephrosis. No contour deforming renal masses. Bladder collapsed by indwelling Foley catheter and grossly normal. Stomach/Bowel: Normal non-distended stomach. Normal caliber small bowel with no small bowel wall thickening. Normal appendix. Normal large bowel with no diverticulosis, large bowel wall thickening or pericolonic fat stranding. Vascular/Lymphatic: Atherosclerotic nonaneurysmal abdominal aorta. No pathologically enlarged lymph nodes in the abdomen or pelvis. Reproductive: Grossly normal uterus.  No adnexal mass. Other: No pneumoperitoneum. Trace free fluid in the pelvic cul-de-sac. No focal fluid collections. No evidence of a ventral abdominal hernia. Musculoskeletal:  No aggressive appearing focal osseous lesions. IMPRESSION: 1. No acute abnormality. No hydronephrosis. No evidence of bowel obstruction or acute bowel inflammation. Normal appendix. 2. Trace free fluid in the pelvic cul-de-sac,  nonspecific. No adnexal masses. 3. Coronary atherosclerosis. 4. Aortic Atherosclerosis (ICD10-I70.0). Electronically Signed   By: Ilona Sorrel M.D.   On: 05/10/2021 18:39   CT ABDOMEN WO CONTRAST  Result Date: 05/17/2021 CLINICAL DATA:  Abdominal pain, possible recent overdose EXAM: CT ABDOMEN WITHOUT CONTRAST TECHNIQUE: Multidetector CT imaging of the abdomen was performed following the standard protocol without IV contrast. COMPARISON:  05/10/2021 FINDINGS: Lower chest: Mild basilar atelectasis is seen although improved when compared with the prior exam. Hepatobiliary: No focal liver abnormality is seen. No gallstones, gallbladder wall thickening, or biliary dilatation. Pancreas: Unremarkable. No pancreatic ductal dilatation or surrounding inflammatory changes. Spleen: Normal in size without focal abnormality. Adrenals/Urinary Tract: Adrenal glands are within normal limits. Kidneys demonstrate no renal calculi or obstructive changes. Visualized ureters are unremarkable. Stomach/Bowel: Visualized bowel structures show no acute abnormality. Vascular/Lymphatic: Atherosclerotic calcifications of the aorta are seen. Other: No free fluid is seen. Musculoskeletal: No acute or significant osseous findings. IMPRESSION: Mild bibasilar atelectasis although improved when compared with the prior study. No other focal abnormality is seen. Electronically Signed   By: Inez Catalina M.D.   On: 05/17/2021 18:36   DG Chest 1 View  Result Date: 05/15/2021 CLINICAL DATA:  60 year old female with altered mental status. EXAM: CHEST  1 VIEW COMPARISON:  Chest radiograph dated 05/10/2021. FINDINGS: There is cardiomegaly with vascular congestion. Bibasilar densities represent atelectasis or infiltrate. There is no pleural effusion pneumothorax. Atherosclerotic calcification of the aortic arch. No acute osseous pathology. IMPRESSION: 1. Cardiomegaly with vascular congestion. 2. Bibasilar atelectasis or infiltrate. Electronically  Signed   By: Anner Crete M.D.   On: 05/15/2021 18:13   CT Head Wo Contrast  Result Date: 05/15/2021 CLINICAL DATA:  Mental status changes. Discharge 2 hours ago after hospitalization for altered mental status and urinary tract infection. EXAM: CT HEAD WITHOUT CONTRAST TECHNIQUE: Contiguous axial images were obtained from the base of the skull through the vertex without intravenous contrast. COMPARISON:  05/10/21 FINDINGS: Brain: No evidence of acute infarction, hemorrhage, hydrocephalus, extra-axial collection or mass lesion/mass effect. There are patchy low-attenuation areas within the subcortical and periventricular white matter compatible with chronic microvascular disease. Vascular: No hyperdense vessel or unexpected calcification. Skull: Normal. Negative for fracture or focal lesion. Sinuses/Orbits: Air-fluid levels identified within the maxillary sinuses. Mild diffuse mucosal thickening with bubbly opacification of the sphenoid sinus and ethmoid air cells. Mastoid air cells are clear. Other: None IMPRESSION: 1. No acute intracranial abnormalities. 2. Chronic small vessel ischemic disease. 3. Acute on chronic sinus inflammation. Electronically Signed   By: Kerby Moors M.D.   On: 05/15/2021 19:46   CT Head Wo Contrast  Result Date: 05/10/2021 CLINICAL DATA:  Unresponsive prior to arrival. Question intracranial hemorrhage. EXAM: CT HEAD WITHOUT CONTRAST TECHNIQUE: Contiguous axial images were obtained from the base of the skull through the vertex without intravenous contrast. COMPARISON:  None. FINDINGS: Brain: Brainstem appears normal. No focal cerebellar finding. Cerebral hemispheres show moderate chronic small-vessel ischemic changes of the white matter. No sign of acute infarction, mass lesion, hemorrhage, hydrocephalus or extra-axial collection. Vascular: There is atherosclerotic calcification of the major vessels at the base of the brain. Skull: Negative Sinuses/Orbits: Clear/normal Other:  None IMPRESSION: No acute finding by CT. Moderate chronic small-vessel ischemic changes of the cerebral hemispheric white matter. Electronically Signed  By: Nelson Chimes M.D.   On: 05/10/2021 13:56   DG Chest Port 1 View  Result Date: 05/10/2021 CLINICAL DATA:  Unresponsive with concern for drug overdose EXAM: PORTABLE CHEST 1 VIEW COMPARISON:  January 27, 2018 chest radiograph. Chest CT October 17, 2017 FINDINGS: There are areas of ill-defined airspace opacity in right mid and lower lung regions. Lungs elsewhere are clear. Heart is upper normal in size, stable, with prominence of central pulmonary arteries, consistent with pulmonary arterial hypertension. No adenopathy appreciable. No bone lesions IMPRESSION: Areas of ill-defined opacity right mid and lower lung regions. Suspect early pneumonia or possible foci of aspiration. Lungs elsewhere clear. Heart upper normal in size with evidence of pulmonary arterial hypertension. Note that pulmonary arterial hypertension was present on prior CT examination. Electronically Signed   By: Lowella Grip III M.D.   On: 05/10/2021 13:23   ECHOCARDIOGRAM COMPLETE  Result Date: 05/11/2021    ECHOCARDIOGRAM REPORT   Patient Name:   ALICEA WENTE Date of Exam: 05/11/2021 Medical Rec #:  [id number]      Height:       68.0 in Accession #:    [id number]     Weight:       207.0 lb Date of Birth:  13-Jul-1961      BSA:          2.074 m Patient Age:    30 years       BP:           110/69 mmHg Patient Gender: F              HR:           100 bpm. Exam Location:  [facility] Procedure: 2D Echo, Cardiac Doppler and Color Doppler Indications:    Elevated Troponin  History:        Patient has prior history of Echocardiogram examinations, most                 recent 10/17/2017. COPD; Signs/Symptoms:Altered Mental Status.                 Sepsis, elevated Troponin, schizophrenia, HIV, pneumonia.  Sonographer:    Dustin Flock RDCS Referring Phys: [place]   1. Left ventricular ejection fraction, by estimation, is 60 to 65%. The left ventricle has normal function. The left ventricle has no regional wall motion abnormalities. There is mild left ventricular hypertrophy. Left ventricular diastolic parameters are consistent with Grade I diastolic dysfunction (impaired relaxation).  2. Right ventricular systolic function is mildly reduced. The right ventricular size is moderately enlarged. There is normal pulmonary artery systolic pressure.  3. The mitral valve is normal in structure. No evidence of mitral valve regurgitation. No evidence of mitral stenosis.  4. The aortic valve is tricuspid. Aortic valve regurgitation is not visualized. No aortic stenosis is present.  5. The inferior vena cava is normal in size with greater than 50% respiratory variability, suggesting right atrial pressure of 3 mmHg. FINDINGS  Left Ventricle: Left ventricular ejection fraction, by estimation, is 60 to 65%. The left ventricle has normal function. The left ventricle has no regional wall motion abnormalities. The left ventricular internal cavity size was normal in size. There is  mild left ventricular hypertrophy. Left ventricular diastolic parameters are consistent with Grade I diastolic dysfunction (impaired relaxation). Normal left ventricular filling pressure. Right Ventricle: The right ventricular size is moderately enlarged. Right vetricular wall thickness was not assessed. Right ventricular systolic function is  mildly reduced. There is normal pulmonary artery systolic pressure. The tricuspid regurgitant velocity is 2.45 m/s, and with an assumed right atrial pressure of 3 mmHg, the estimated right ventricular systolic pressure is 79.8 mmHg. Left Atrium: Left atrial size was normal in size. Right Atrium: Right atrial size was normal in size. Pericardium: There is no evidence of pericardial effusion. Mitral Valve: The mitral valve is normal in structure. No evidence of mitral valve  regurgitation. No evidence of mitral valve stenosis. Tricuspid Valve: The tricuspid valve is not well visualized. Tricuspid valve regurgitation is trivial. No evidence of tricuspid stenosis. Aortic Valve: The aortic valve is tricuspid. Aortic valve regurgitation is not visualized. No aortic stenosis is present. Aortic valve mean gradient measures 5.2 mmHg. Aortic valve peak gradient measures 10.0 mmHg. Aortic valve area, by VTI measures 2.35  cm. Pulmonic Valve: The pulmonic valve was not well visualized. Pulmonic valve regurgitation is not visualized. No evidence of pulmonic stenosis. Aorta: The aortic root is normal in size and structure. Pulmonary Artery: Indeterminant PASP, inadequate TR jet. Venous: The inferior vena cava is normal in size with greater than 50% respiratory variability, suggesting right atrial pressure of 3 mmHg. IAS/Shunts: No atrial level shunt detected by color flow Doppler.  LEFT VENTRICLE PLAX 2D LVIDd:         3.63 cm  Diastology LVIDs:         2.39 cm  LV e' medial:    5.66 cm/s LV PW:         1.16 cm  LV E/e' medial:  9.8 LV IVS:        1.13 cm  LV e' lateral:   10.00 cm/s LVOT diam:     2.00 cm  LV E/e' lateral: 5.5 LV SV:         57 LV SV Index:   27 LVOT Area:     3.14 cm  RIGHT VENTRICLE RV Basal diam:  3.96 cm RV S prime:     13.20 cm/s TAPSE (M-mode): 2.3 cm LEFT ATRIUM             Index       RIGHT ATRIUM           Index LA diam:        3.50 cm 1.69 cm/m  RA Area:     18.40 cm LA Vol (A2C):   55.9 ml 26.95 ml/m RA Volume:   51.60 ml  24.88 ml/m LA Vol (A4C):   42.8 ml 20.64 ml/m LA Biplane Vol: 51.7 ml 24.93 ml/m  AORTIC VALVE AV Area (Vmax):    2.12 cm AV Area (Vmean):   2.49 cm AV Area (VTI):     2.35 cm AV Vmax:           158.22 cm/s AV Vmean:          107.448 cm/s AV VTI:            0.242 m AV Peak Grad:      10.0 mmHg AV Mean Grad:      5.2 mmHg LVOT Vmax:         107.00 cm/s LVOT Vmean:        85.000 cm/s LVOT VTI:          0.181 m LVOT/AV VTI ratio: 0.75  AORTA  Ao Root diam: 2.50 cm MITRAL VALVE               TRICUSPID VALVE MV Area (PHT): 5.46 cm  TR Peak grad:   24.0 mmHg MV Decel Time: 139 msec    TR Vmax:        245.00 cm/s MV E velocity: 55.20 cm/s MV A velocity: 83.70 cm/s  SHUNTS MV E/A ratio:  0.66        Systemic VTI:  0.18 m                            Systemic Diam: 2.00 cm Carlyle Dolly MD Electronically signed by Carlyle Dolly MD Signature Date/Time: 05/11/2021/5:26:32 PM    Final    DG HIP UNILAT WITH PELVIS 2-3 VIEWS RIGHT  Result Date: 05/17/2021 CLINICAL DATA:  Right hip pain and altered mental status, initial encounter EXAM: DG HIP (WITH OR WITHOUT PELVIS) 3V RIGHT COMPARISON:  05/10/2021 CT FINDINGS: Pelvic ring is intact. 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 No discrete fracture is seen. Remodeling of the right femoral head is noted. No soft tissue abnormality is seen. IMPRESSION: Significant degenerative changes right greater than left similar to that seen on prior CT. No acute abnormality noted. Electronically Signed   By: Inez Catalina M.D.   On: 05/17/2021 18:38    SIGNED: Deatra James, MD, FHM. [facility],  Pager (please use [email or web address] to page/text) Please use Epic Secure Chat for non-urgent communication (7AM-7PM)  If 7PM-7AM, please contact night-coverage [email or web address], 05/20/2021, 11:36 AM

## 2021-05-21 ENCOUNTER — Inpatient Hospital Stay (HOSPITAL_COMMUNITY): Payer: Medicaid Other

## 2021-05-21 ENCOUNTER — Telehealth: Payer: Self-pay | Admitting: Gastroenterology

## 2021-05-21 ENCOUNTER — Encounter (HOSPITAL_COMMUNITY): Payer: Self-pay | Admitting: Internal Medicine

## 2021-05-21 ENCOUNTER — Encounter: Payer: Self-pay | Admitting: Gastroenterology

## 2021-05-21 LAB — GLUCOSE, CAPILLARY
Glucose-Capillary: 100 mg/dL — ABNORMAL HIGH (ref 70–99)
Glucose-Capillary: 102 mg/dL — ABNORMAL HIGH (ref 70–99)
Glucose-Capillary: 115 mg/dL — ABNORMAL HIGH (ref 70–99)
Glucose-Capillary: 70 mg/dL (ref 70–99)
Glucose-Capillary: 78 mg/dL (ref 70–99)
Glucose-Capillary: 89 mg/dL (ref 70–99)
Glucose-Capillary: 91 mg/dL (ref 70–99)
Glucose-Capillary: 95 mg/dL (ref 70–99)

## 2021-05-21 LAB — CBC
HCT: 31.2 % — ABNORMAL LOW (ref 36.0–46.0)
Hemoglobin: 9.9 g/dL — ABNORMAL LOW (ref 12.0–15.0)
MCH: 31.7 pg (ref 26.0–34.0)
MCHC: 31.7 g/dL (ref 30.0–36.0)
MCV: 100 fL (ref 80.0–100.0)
Platelets: 221 10*3/uL (ref 150–400)
RBC: 3.12 MIL/uL — ABNORMAL LOW (ref 3.87–5.11)
RDW: 15.5 % (ref 11.5–15.5)
WBC: 8.9 10*3/uL (ref 4.0–10.5)
nRBC: 0 % (ref 0.0–0.2)

## 2021-05-21 LAB — BASIC METABOLIC PANEL
Anion gap: 7 (ref 5–15)
BUN: 13 mg/dL (ref 6–20)
CO2: 29 mmol/L (ref 22–32)
Calcium: 8.3 mg/dL — ABNORMAL LOW (ref 8.9–10.3)
Chloride: 103 mmol/L (ref 98–111)
Creatinine, Ser: 0.84 mg/dL (ref 0.44–1.00)
GFR, Estimated: >60 mL/min (ref >60)
Glucose, Bld: 82 mg/dL (ref 70–99)
Potassium: 4 mmol/L (ref 3.5–5.1)
Sodium: 139 mmol/L (ref 135–145)

## 2021-05-21 MED ORDER — APIXABAN 5 MG PO TABS: ORAL_TABLET | ORAL | 3 refills | Status: DC

## 2021-05-21 MED ORDER — APIXABAN 5 MG PO TABS: 5.0000 mg | ORAL_TABLET | Freq: Two times a day (BID) | ORAL | Status: DC

## 2021-05-21 MED ORDER — APIXABAN 5 MG PO TABS
10.0000 mg | ORAL_TABLET | Freq: Two times a day (BID) | ORAL | Status: DC
  Administered 2021-05-21 – 2021-05-22 (×3): 10 mg via ORAL
  Filled 2021-05-21 (×3): qty 2

## 2021-05-21 NOTE — NC FL2 (Signed)
[place] LEVEL OF CARE SCREENING TOOL     IDENTIFICATION  Patient Name: Karina Clarke Birthdate: 1961-03-02 Sex: female Admission Date (Current Location): 05/15/2021  [facility] and [place] Number:  [employer] and Address:  [place] [address], [place]      Provider Number: [id number]  Attending Physician Name and Address:  Deatra James, MD  Relative Name and Phone Number:  [name] - Legal Guardian [phone]    Current Level of Care: Hospital Recommended Level of Care: [facility] Prior Approval Number:    Date Approved/Denied:   PASRR Number:    Discharge Plan: Domiciliary (Rest home) ([facility])    Current Diagnoses: Patient Active Problem List   Diagnosis Date Noted   Dysphagia    Hypomagnesemia 05/16/2021   Hypoalbuminemia 05/16/2021   Essential hypertension 05/16/2021   AMS (altered mental status) 05/10/2021   PNA (pneumonia) 05/10/2021   Transaminitis 05/10/2021   Diabetes mellitus, new onset ([place]) 10/24/2017   Hyperkalemia 10/24/2017   AKI (acute kidney injury) ([place]) 10/24/2017   COPD exacerbation ([place]) 10/21/2017   Hospital-acquired pneumonia 10/12/2017   Acute respiratory failure with hypoxia ([place]) 10/12/2017   AIDS ([place]) 10/12/2017   Influenza 12/26/2016   Sepsis ([place]) 12/26/2016   CAP (community acquired pneumonia) 12/26/2016   Tobacco use disorder 12/26/2016   HIV (human immunodeficiency virus infection) ([place]) 12/26/2016   Schizophrenia ([place]) 12/26/2016   Personality disorder ([place]) 08/22/2016   Encounter for screening colonoscopy 05/24/2015   Vaginal discharge 11/04/2013   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   Chronic paranoid schizophrenia ([place]) 10/19/2011   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   Thrombocytopenia, unspecified ([place]) 10/19/2011    Orientation RESPIRATION BLADDER Height & Weight     Self  Normal External catheter Weight: 217 lb 6  oz (98.6 kg) Height:  5\' 8"  (172.7 cm)  BEHAVIORAL SYMPTOMS/MOOD NEUROLOGICAL BOWEL NUTRITION STATUS      Incontinent Diet (dysphagia 3 diet with thin liquids)  AMBULATORY STATUS COMMUNICATION OF NEEDS Skin   Total Care Verbally Skin abrasions, Other (Comment) (blister right arm)                       Personal Care Assistance Level of Assistance  Bathing, Feeding, Dressing Bathing Assistance: Maximum assistance Feeding assistance: Maximum assistance Dressing Assistance: Maximum assistance     Functional Limitations Info  Sight, Hearing, Speech Sight Info: Adequate Hearing Info: Adequate Speech Info: Adequate    SPECIAL CARE FACTORS FREQUENCY  PT (By licensed PT)     PT Frequency: 3 times a week              Contractures Contractures Info: Not present    Additional Factors Info  Code Status, Allergies, Psychotropic Code Status Info: Full Allergies Info: [place] Psychotropic Info: ALPRAZolam, atazanavir, divalproex, haloperidol, haloperidol decanoate,       Current Medications (05/21/2021):  This is the current hospital active medication list Current Facility-Administered Medications  Medication Dose Route Frequency Provider Last Rate Last Admin   abacavir-lamiVUDine (EPZICOM) 600-300 MG per tablet 1 tablet  1 tablet Oral Daily Rourk, Cristopher Estimable, MD       ALPRAZolam Duanne Moron) tablet 0.25 mg  0.25 mg Oral TID Daneil Dolin, MD   0.25 mg at 05/21/21 0834   apixaban (ELIQUIS) tablet 10 mg  10 mg Oral BID Skipper Cliche A, MD   10 mg at 05/21/21 1132   Followed by   [  START ON 05/28/2021] apixaban (ELIQUIS) tablet 5 mg  5 mg Oral BID Shahmehdi, Seyed A, MD       atazanavir (REYATAZ) capsule 400 mg  400 mg Oral Q breakfast Daneil Dolin, MD   400 mg at 05/21/21 8588   atorvastatin (LIPITOR) tablet 10 mg  10 mg Oral Daily Daneil Dolin, MD   10 mg at 05/21/21 5027   Chlorhexidine Gluconate Cloth 2 % PADS 6 each  6 each Topical Daily Daneil Dolin, MD   6 each at  05/21/21 [phone]   divalproex (DEPAKOTE SPRINKLE) capsule 500 mg  500 mg Oral 2 times per day Daneil Dolin, MD   500 mg at 05/20/21 2029   feeding supplement (ENSURE ENLIVE / ENSURE PLUS) liquid 237 mL  237 mL Oral BID BM Daneil Dolin, MD   237 mL at 05/21/21 1250   guaiFENesin-dextromethorphan (ROBITUSSIN DM) 100-10 MG/5ML syrup 10 mL  10 mL Oral BID Daneil Dolin, MD   10 mL at 05/21/21 0834   haloperidol (HALDOL) tablet 10 mg  10 mg Oral TID Daneil Dolin, MD   10 mg at 05/21/21 0834   HYDROmorphone (DILAUDID) injection 1 mg  1 mg Intravenous Q3H PRN Daneil Dolin, MD   1 mg at 05/21/21 0435   hydrOXYzine (ATARAX/VISTARIL) tablet 25 mg  25 mg Oral TID PRN Daneil Dolin, MD   25 mg at 05/20/21 2030   insulin aspart (novoLOG) injection 0-6 Units  0-6 Units Subcutaneous Q4H Daneil Dolin, MD   1 Units at 05/16/21 2020   insulin detemir (LEVEMIR) injection 10 Units  10 Units Subcutaneous QHS Skipper Cliche A, MD   10 Units at 05/20/21 2216   lactobacillus (FLORANEX/LACTINEX) granules 1 g  1 g Oral TID WC Daneil Dolin, MD   1 g at 05/21/21 1135   levofloxacin (LEVAQUIN) tablet 750 mg  750 mg Oral Daily Daneil Dolin, MD   750 mg at 05/20/21 2212   magnesium oxide (MAG-OX) tablet 400 mg  400 mg Oral q morning Daneil Dolin, MD   400 mg at 05/21/21 1042   metoprolol tartrate (LOPRESSOR) tablet 12.5 mg  12.5 mg Oral BID WC Shahmehdi, Seyed A, MD   12.5 mg at 05/20/21 1803   pantoprazole (PROTONIX) EC tablet 40 mg  40 mg Oral Daily Daneil Dolin, MD   40 mg at 05/21/21 0834   trihexyphenidyl (ARTANE) tablet 2 mg  2 mg Oral BID WC Daneil Dolin, MD   2 mg at 05/21/21 1042     Discharge Medications: Medication List       STOP taking these medications     amoxicillin-clavulanate 875-125 MG tablet Commonly known as: Augmentin    hydrOXYzine 25 MG tablet Commonly known as: ATARAX/VISTARIL    QUEtiapine 200 MG tablet Commonly known as: SEROQUEL    zolpidem 10 MG  tablet Commonly known as: AMBIEN           TAKE these medications     abacavir-lamiVUDine 600-300 MG tablet Commonly known as: EPZICOM Take 1 tablet by mouth daily.    acetaminophen 650 MG CR tablet Commonly known as: TYLENOL Take 650 mg by mouth every 8 (eight) hours as needed for pain.    albuterol 108 (90 Base) MCG/ACT inhaler Commonly known as: VENTOLIN HFA Inhale 1-2 puffs into the lungs 2 (two) times daily as needed for wheezing or shortness of breath.    ALPRAZolam 0.5 MG tablet  Commonly known as: XANAX Take 1 tablet (0.5 mg total) by mouth 2 (two) times daily as needed for anxiety.    apixaban 5 MG Tabs tablet Commonly known as: Eliquis Take 2 tablets (10mg ) twice daily for 7 days, then 1 tablet (5mg ) twice daily    atazanavir 200 MG capsule Commonly known as: REYATAZ Take 400 mg by mouth daily.    atorvastatin 10 MG tablet Commonly known as: LIPITOR Take 10 mg by mouth daily.    divalproex 500 MG DR tablet Commonly known as: DEPAKOTE Take 1 tablet (500 mg total) by mouth 2 (two) times daily. Take 1 tablet (500 mg) by mouth at 4pm and 8pm daily    haloperidol 10 MG tablet Commonly known as: HALDOL Take 1 tablet (10 mg total) by mouth 3 (three) times daily.    haloperidol decanoate 100 MG/ML injection Commonly known as: HALDOL DECANOATE Inject 200 mg into the muscle every 28 (twenty-eight) days.    insulin detemir 100 UNIT/ML injection Commonly known as: LEVEMIR Inject 0.1 mLs (10 Units total) into the skin at bedtime. What changed: how much to take    levofloxacin 750 MG tablet Commonly known as: LEVAQUIN Take 1 tablet (750 mg total) by mouth daily for 5 days.    Magnesium Oxide 400 MG Caps Take 1 capsule (400 mg total) by mouth every morning.    metoprolol tartrate 25 MG tablet Commonly known as: LOPRESSOR Take 0.5 tablets (12.5 mg total) by mouth 2 (two) times daily with a meal. What changed: how much to take when to take this     polyethylene glycol 17 g packet Commonly known as: MIRALAX / GLYCOLAX Take 17 g by mouth daily as needed for mild constipation.    raltegravir 400 MG tablet Commonly known as: ISENTRESS Take 400 mg by mouth 2 (two) times daily.    trihexyphenidyl 2 MG tablet Commonly known as: ARTANE Take 2 mg by mouth 2 (two) times daily with a meal.    valACYclovir 1000 MG tablet Commonly known as: VALTREX Take 1,000 mg by mouth daily.       Relevant Imaging Results:  Relevant Lab Results:   Additional Information SS# [id number]. Home health physical therapy.  Salome Arnt, LCSW

## 2021-05-21 NOTE — TOC Transition Note (Addendum)
Transition of Care [facility]) - CM/SW Discharge Note   Patient Details  Name: Karina Clarke MRN: [id number] Date of Birth: Nov 13, 1961  [facility] CM/SW Contact:  Salome Arnt, [place] Phone Number: 05/21/2021, 2:01 PM   Clinical Narrative:  Pt d/c today to [facility].  Pt's guardian, Harmon Pier notified. LCSW spoke with [name] regarding d/c. Will transport via [employer]. Per RN, pt does not qualify for home O2. LCSW will send d/c summary and FL2 to [facility]. Linda with [facility] notified of d/c for home health.   Update: Per EMS, no convalescent trucks available for out of town transport today. They may be able to transport this evening or it will be tomorrow. RN and MD updated. LCSW confirmed with RN pt is completely bed bound. Facility requested that RN call [phone] when EMS leaves so staff there is aware.   LCSW contacted [facility] regarding situation. They report they are unable to transport today. LCSW updated pt's guardian.   Final next level of care: [place] Barriers to Discharge: Barriers Resolved   Patient Goals and CMS Choice Patient states their goals for this hospitalization and ongoing recovery are:: return to [facility] CMS Medicare.gov Compare Post Acute Care list provided to:: Patient Represenative (must comment) Choice offered to / list presented to : [place] / [place]  Discharge Placement              Patient chooses bed at: [facility] Patient to be transferred to facility by: [facility] Name of family member notified: Harmon Pier- legal guardian Patient and family notified of of transfer: 05/21/21  Discharge Plan and Services                DME Arranged: N/A DME Agency: NA       HH Arranged: PT [place] Agency: [place] ([place]) Date HH Agency Contacted: 05/21/21 Time [place] Agency Contacted: 1400 Representative spoke with at [place]: [place] ([place]) Interventions     Readmission Risk Interventions Readmission Risk Prevention Plan 05/17/2021 05/15/2021  Post Dischage Appt - Complete  Medication Screening - Complete  Transportation Screening Complete Complete  HRI or Home Care Consult Complete -  Social Work Consult for [place] Planning/Counseling Complete -  Palliative Care Screening Not Applicable -  Medication Review Press photographer) Complete -  Some recent data might be hidden

## 2021-05-21 NOTE — Discharge Summary (Addendum)
Physician Discharge Summary [facility]    Patient: Karina Clarke                   Admit date: 05/15/2021   DOB: [birth year]             Discharge date:05/21/2021/10:48 AM KZS:[id number]                          PCP: Rosita Fire, MD  Disposition: SNF    Recommendations for Outpatient Follow-up:   Follow up: With PCP 2-3 days, Her CBG needs to be monitored very closely as it fluctuates-Lantus and SSI coverage needs to be continued, dose adjusted Oral hydration recommended Daily weight Continue current medication, aspiration precaution Status post EGD and dilatation please follow GI recommendation  Discharge Condition: Stable   Code Status:   Code Status: Full Code  Diet recommendation: Diabetic diet -dysphagia 3 diet Diet recommendations: Dysphagia 3 (mechanical soft);Thin liquid Liquids provided via: Cup;Straw Medication Administration: Whole meds with liquid Supervision: Full supervision/cueing for compensatory strategies Compensations: Minimize environmental distractions Postural Changes and/or Swallow Maneuvers: Seated upright 90 degrees;Upright 30-60 min after meal  Discharge Diagnoses:    Active Problems:   HIV (human immunodeficiency virus infection) ([place])   Hospital-acquired pneumonia   Acute respiratory failure with hypoxia ([place])   Diabetes mellitus, new onset ([place])   Chronic paranoid schizophrenia ([place])   Transaminitis   Hypomagnesemia   Hypoalbuminemia   Essential hypertension   Dysphagia   History of Present Illness/ Hospital Course Karina Clarke Summary:  Karina Clarke is a 60 y.o. female with medical history significant for schizophrenia, HIV, COPD.  Patient was brought from nursing home with reports of unresponsiveness, with possible overdose.  She was given some Narcan prior to arrival as well as in the ED with some improvement in responsiveness.  She has been admitted with acute metabolic encephalopathy likely multifactorial in the setting of  pneumonia, azotemia/AKI, and elevated liver enzymes.  GI consulted to assist with evaluation of liver injury with labs showing signs of improvement  Acute toxic metabolic encephalopathy-multifactorial;  -Much improved, able to communicate, poor insight - -Likely related to metabolic derangements that are also showing signs of improvement -IVF - will be on dysphagia 3 -per speech -recommended GI evaluation -Head CT without acute findings -Continue to treat sepsis -UDS only positive for benzodiazepines with Xanax prescription outpatient -PT evaluation >>> recommending SNF   Abdominal pain/dysphagia CT abdomen 05/17/2021 reviewed no for any acute findings -Recommended dysphagia 3 diet -Speech evaluated, recommending GI consultation for possible strictures -As needed analgesics, for abdominal pain -GI consulted, pending for EGD report as below   status post EGD 05/19/2021 impression - Normal esophagus. Dilated. - Erosive gastropathy with no stigmata of recent bleeding. Biopsied. - Normal duodenal bulb and second portion of the duodenum.  - Recent significant bump in transaminases noted. Steady improvement observed which is reassuring.  Scenario suspicious for an ischemic event although transient biliary obstruction (stone/microlithiasis) not excluded. Also,  nonhepatic component of aminotransferase elevation not excluded.   Continue Protonix 40 mg daily, follow-up with pathology from biopsy   Speech recommendation: Diet recommendations: Dysphagia 3 (mechanical soft);Thin liquid Liquids provided via: Cup;Straw Medication Administration: Whole meds with liquid Supervision: Full supervision/cueing for compensatory strategies Compensations: Minimize environmental distractions Postural Changes and/or Swallow Maneuvers: Seated upright 90 degrees;Upright 30-60 min after meal    Bilateral DVT  -Ultrasound lower extremities 05/21/2021 -Initiated on Eliquis, to be continued for minimum of 3  months  AKI-improving -Resolved responded to IV fluids   Severe sepsis secondary to pneumonia and also possible UTI -Continue IV Unasyn and discontinue further azithromycin >>> switch to p.o. Levaquin -Urine cultures >>  no growth -Blood cultures >>> NGTD Afebrile normotensive   Transaminitis -monitoring, improving -Possibly due to drug-induced liver injury versus ischemia vs sepsis -Appreciate ongoing GI evaluation -Continue to follow LFTs AST/ALT/ ... Trending down -Stable, avoiding hepatotoxins   Hypernatremia-resolved -Responded to IV fluids..  DC'd   Elevated troponin -2D echocardiogram 60-65% EF with grade 1 diastolic dysfunction   Thrombocytopenia-resolved -Likely related to sepsis -Continue to monitor trend -Avoid heparin agents   HIV -HIV viral load not detected 12/2020 with stable CD4 count -Hold medications for now   Schizophrenia -Resume Haldol, Depakote, Xanax -Discontinued the rest of her medication due to causing encephalopathy     Hypertension-stable, BP meds recommended, blood pressure remains soft   Type 2 diabetes-with hypoglycemia -Hemoglobin A1c 6.0% - reducing Lantus (10 units nightly) CBG QA CHS, SSI coverage     -------------------------------------------------------------------------------------------------------------------------------- Cultures; Urine cultures 05/16/2021 >> NGTD   Antimicrobials: P.o. Levaquin   Consultants: None   ---------------------------------------------------------------------------------------------------------------------------------    Code Status:   Code Status: Full Code   Family Communication: No family member present at bedside- attempt will be made to update daily The above findings and plan of care has been discussed with patient (and family)  in detail, they expressed understanding and agreement of above. -Advance care planning has been discussed.   Admission status:    Dispo: The patient is  from: SNF              Anticipated d/c is to: SNF     Discharge Instructions:   Discharge Instructions     Activity as tolerated - No restrictions   Complete by: As directed    Activity as tolerated - No restrictions   Complete by: As directed    Diet - low sodium heart healthy   Complete by: As directed    Discharge instructions   Complete by: As directed    F/up at SNF ...   Increase activity slowly   Complete by: As directed    Increase activity slowly   Complete by: As directed         Medication List     STOP taking these medications    amoxicillin-clavulanate 875-125 MG tablet Commonly known as: Augmentin   hydrOXYzine 25 MG tablet Commonly known as: ATARAX/VISTARIL   QUEtiapine 200 MG tablet Commonly known as: SEROQUEL   zolpidem 10 MG tablet Commonly known as: AMBIEN       TAKE these medications    abacavir-lamiVUDine 600-300 MG tablet Commonly known as: EPZICOM Take 1 tablet by mouth daily.   acetaminophen 650 MG CR tablet Commonly known as: TYLENOL Take 650 mg by mouth every 8 (eight) hours as needed for pain.   albuterol 108 (90 Base) MCG/ACT inhaler Commonly known as: VENTOLIN HFA Inhale 1-2 puffs into the lungs 2 (two) times daily as needed for wheezing or shortness of breath.   ALPRAZolam 0.5 MG tablet Commonly known as: XANAX Take 1 tablet (0.5 mg total) by mouth 2 (two) times daily as needed for anxiety.   apixaban 5 MG Tabs tablet Commonly known as: Eliquis Take 2 tablets (10mg ) twice daily for 7 days, then 1 tablet (5mg ) twice daily   atazanavir 200 MG capsule Commonly known as: REYATAZ Take 400 mg by mouth daily.   atorvastatin 10 MG tablet Commonly  known as: LIPITOR Take 10 mg by mouth daily.   divalproex 500 MG DR tablet Commonly known as: DEPAKOTE Take 1 tablet (500 mg total) by mouth 2 (two) times daily. Take 1 tablet (500 mg) by mouth at 4pm and 8pm daily   haloperidol 10 MG tablet Commonly known as:  HALDOL Take 1 tablet (10 mg total) by mouth 3 (three) times daily.   haloperidol decanoate 100 MG/ML injection Commonly known as: HALDOL DECANOATE Inject 200 mg into the muscle every 28 (twenty-eight) days.   insulin detemir 100 UNIT/ML injection Commonly known as: LEVEMIR Inject 0.1 mLs (10 Units total) into the skin at bedtime. What changed: how much to take   levofloxacin 750 MG tablet Commonly known as: LEVAQUIN Take 1 tablet (750 mg total) by mouth daily for 5 days.   Magnesium Oxide 400 MG Caps Take 1 capsule (400 mg total) by mouth every morning.   metoprolol tartrate 25 MG tablet Commonly known as: LOPRESSOR Take 0.5 tablets (12.5 mg total) by mouth 2 (two) times daily with a meal. What changed:  how much to take when to take this   polyethylene glycol 17 g packet Commonly known as: MIRALAX / GLYCOLAX Take 17 g by mouth daily as needed for mild constipation.   raltegravir 400 MG tablet Commonly known as: ISENTRESS Take 400 mg by mouth 2 (two) times daily.   trihexyphenidyl 2 MG tablet Commonly known as: ARTANE Take 2 mg by mouth 2 (two) times daily with a meal.   valACYclovir 1000 MG tablet Commonly known as: VALTREX Take 1,000 mg by mouth daily.        Follow-up Information     [facility].   Specialty: Home Health Services Why: PT will call to schedule first visit within 48 hours of discharge.               Allergies  Allergen Reactions   Asa [Aspirin] Nausea And Vomiting     Procedures /Studies:   CT ABDOMEN PELVIS WO CONTRAST  Result Date: 05/10/2021 CLINICAL DATA:  Lethargy. Abdominal pain. Elevated liver enzymes. Acute kidney injury. Altered mental status. History of umbilical hernia. EXAM: CT ABDOMEN AND PELVIS WITHOUT CONTRAST TECHNIQUE: Multidetector CT imaging of the abdomen and pelvis was performed following the standard protocol without IV contrast. COMPARISON:  None. FINDINGS: Lower chest:  Hypoventilatory changes at the dependent lung bases. Coronary atherosclerosis. Hepatobiliary: Normal liver size. No liver mass. Normal gallbladder with no radiopaque cholelithiasis. No biliary ductal dilatation. Pancreas: Normal, with no mass or duct dilation. Spleen: Normal size. No mass. Adrenals/Urinary Tract: Normal adrenals. No renal stones. No hydronephrosis. No contour deforming renal masses. Bladder collapsed by indwelling Foley catheter and grossly normal. Stomach/Bowel: Normal non-distended stomach. Normal caliber small bowel with no small bowel wall thickening. Normal appendix. Normal large bowel with no diverticulosis, large bowel wall thickening or pericolonic fat stranding. Vascular/Lymphatic: Atherosclerotic nonaneurysmal abdominal aorta. No pathologically enlarged lymph nodes in the abdomen or pelvis. Reproductive: Grossly normal uterus.  No adnexal mass. Other: No pneumoperitoneum. Trace free fluid in the pelvic cul-de-sac. No focal fluid collections. No evidence of a ventral abdominal hernia. Musculoskeletal: No aggressive appearing focal osseous lesions. IMPRESSION: 1. No acute abnormality. No hydronephrosis. No evidence of bowel obstruction or acute bowel inflammation. Normal appendix. 2. Trace free fluid in the pelvic cul-de-sac, nonspecific. No adnexal masses. 3. Coronary atherosclerosis. 4. Aortic Atherosclerosis (ICD10-I70.0). Electronically Signed   By: Ilona Sorrel M.D.   On: 05/10/2021 18:39  CT ABDOMEN WO CONTRAST  Result Date: 05/17/2021 CLINICAL DATA:  Abdominal pain, possible recent overdose EXAM: CT ABDOMEN WITHOUT CONTRAST TECHNIQUE: Multidetector CT imaging of the abdomen was performed following the standard protocol without IV contrast. COMPARISON:  05/10/2021 FINDINGS: Lower chest: Mild basilar atelectasis is seen although improved when compared with the prior exam. Hepatobiliary: No focal liver abnormality is seen. No gallstones, gallbladder wall thickening, or biliary  dilatation. Pancreas: Unremarkable. No pancreatic ductal dilatation or surrounding inflammatory changes. Spleen: Normal in size without focal abnormality. Adrenals/Urinary Tract: Adrenal glands are within normal limits. Kidneys demonstrate no renal calculi or obstructive changes. Visualized ureters are unremarkable. Stomach/Bowel: Visualized bowel structures show no acute abnormality. Vascular/Lymphatic: Atherosclerotic calcifications of the aorta are seen. Other: No free fluid is seen. Musculoskeletal: No acute or significant osseous findings. IMPRESSION: Mild bibasilar atelectasis although improved when compared with the prior study. No other focal abnormality is seen. Electronically Signed   By: Inez Catalina M.D.   On: 05/17/2021 18:36   DG Chest 1 View  Result Date: 05/15/2021 CLINICAL DATA:  60 year old female with altered mental status. EXAM: CHEST  1 VIEW COMPARISON:  Chest radiograph dated 05/10/2021. FINDINGS: There is cardiomegaly with vascular congestion. Bibasilar densities represent atelectasis or infiltrate. There is no pleural effusion pneumothorax. Atherosclerotic calcification of the aortic arch. No acute osseous pathology. IMPRESSION: 1. Cardiomegaly with vascular congestion. 2. Bibasilar atelectasis or infiltrate. Electronically Signed   By: Anner Crete M.D.   On: 05/15/2021 18:13   CT Head Wo Contrast  Result Date: 05/15/2021 CLINICAL DATA:  Mental status changes. Discharge 2 hours ago after hospitalization for altered mental status and urinary tract infection. EXAM: CT HEAD WITHOUT CONTRAST TECHNIQUE: Contiguous axial images were obtained from the base of the skull through the vertex without intravenous contrast. COMPARISON:  05/10/21 FINDINGS: Brain: No evidence of acute infarction, hemorrhage, hydrocephalus, extra-axial collection or mass lesion/mass effect. There are patchy low-attenuation areas within the subcortical and periventricular white matter compatible with chronic  microvascular disease. Vascular: No hyperdense vessel or unexpected calcification. Skull: Normal. Negative for fracture or focal lesion. Sinuses/Orbits: Air-fluid levels identified within the maxillary sinuses. Mild diffuse mucosal thickening with bubbly opacification of the sphenoid sinus and ethmoid air cells. Mastoid air cells are clear. Other: None IMPRESSION: 1. No acute intracranial abnormalities. 2. Chronic small vessel ischemic disease. 3. Acute on chronic sinus inflammation. Electronically Signed   By: Kerby Moors M.D.   On: 05/15/2021 19:46   CT Head Wo Contrast  Result Date: 05/10/2021 CLINICAL DATA:  Unresponsive prior to arrival. Question intracranial hemorrhage. EXAM: CT HEAD WITHOUT CONTRAST TECHNIQUE: Contiguous axial images were obtained from the base of the skull through the vertex without intravenous contrast. COMPARISON:  None. FINDINGS: Brain: Brainstem appears normal. No focal cerebellar finding. Cerebral hemispheres show moderate chronic small-vessel ischemic changes of the white matter. No sign of acute infarction, mass lesion, hemorrhage, hydrocephalus or extra-axial collection. Vascular: There is atherosclerotic calcification of the major vessels at the base of the brain. Skull: Negative Sinuses/Orbits: Clear/normal Other: None IMPRESSION: No acute finding by CT. Moderate chronic small-vessel ischemic changes of the cerebral hemispheric white matter. Electronically Signed   By: Nelson Chimes M.D.   On: 05/10/2021 13:56   DG Chest Port 1 View  Result Date: 05/10/2021 CLINICAL DATA:  Unresponsive with concern for drug overdose EXAM: PORTABLE CHEST 1 VIEW COMPARISON:  January 27, 2018 chest radiograph. Chest CT October 17, 2017 FINDINGS: There are areas of ill-defined airspace opacity in right mid and  lower lung regions. Lungs elsewhere are clear. Heart is upper normal in size, stable, with prominence of central pulmonary arteries, consistent with pulmonary arterial hypertension. No  adenopathy appreciable. No bone lesions IMPRESSION: Areas of ill-defined opacity right mid and lower lung regions. Suspect early pneumonia or possible foci of aspiration. Lungs elsewhere clear. Heart upper normal in size with evidence of pulmonary arterial hypertension. Note that pulmonary arterial hypertension was present on prior CT examination. Electronically Signed   By: Lowella Grip III M.D.   On: 05/10/2021 13:23   ECHOCARDIOGRAM COMPLETE  Result Date: 05/11/2021    ECHOCARDIOGRAM REPORT   Patient Name:   ADDISEN CHAPPELLE Date of Exam: 05/11/2021 Medical Rec #:  [id number]      Height:       68.0 in Accession #:    [id number]     Weight:       207.0 lb Date of Birth:  1960-11-28      BSA:          2.074 m Patient Age:    60 years       BP:           110/69 mmHg Patient Gender: F              HR:           100 bpm. Exam Location:  [facility] Procedure: 2D Echo, Cardiac Doppler and Color Doppler Indications:    Elevated Troponin  History:        Patient has prior history of Echocardiogram examinations, most                 recent 10/17/2017. COPD; Signs/Symptoms:Altered Mental Status.                 Sepsis, elevated Troponin, schizophrenia, HIV, pneumonia.  Sonographer:    Dustin Flock RDCS Referring Phys: [employer]  1. Left ventricular ejection fraction, by estimation, is 60 to 65%. The left ventricle has normal function. The left ventricle has no regional wall motion abnormalities. There is mild left ventricular hypertrophy. Left ventricular diastolic parameters are consistent with Grade I diastolic dysfunction (impaired relaxation).  2. Right ventricular systolic function is mildly reduced. The right ventricular size is moderately enlarged. There is normal pulmonary artery systolic pressure.  3. The mitral valve is normal in structure. No evidence of mitral valve regurgitation. No evidence of mitral stenosis.  4. The aortic valve is tricuspid. Aortic valve regurgitation  is not visualized. No aortic stenosis is present.  5. The inferior vena cava is normal in size with greater than 50% respiratory variability, suggesting right atrial pressure of 3 mmHg. FINDINGS  Left Ventricle: Left ventricular ejection fraction, by estimation, is 60 to 65%. The left ventricle has normal function. The left ventricle has no regional wall motion abnormalities. The left ventricular internal cavity size was normal in size. There is  mild left ventricular hypertrophy. Left ventricular diastolic parameters are consistent with Grade I diastolic dysfunction (impaired relaxation). Normal left ventricular filling pressure. Right Ventricle: The right ventricular size is moderately enlarged. Right vetricular wall thickness was not assessed. Right ventricular systolic function is mildly reduced. There is normal pulmonary artery systolic pressure. The tricuspid regurgitant velocity is 2.45 m/s, and with an assumed right atrial pressure of 3 mmHg, the estimated right ventricular systolic pressure is 15.4 mmHg. Left Atrium: Left atrial size was normal in size. Right Atrium: Right atrial size was normal in size. Pericardium: There is no evidence  of pericardial effusion. Mitral Valve: The mitral valve is normal in structure. No evidence of mitral valve regurgitation. No evidence of mitral valve stenosis. Tricuspid Valve: The tricuspid valve is not well visualized. Tricuspid valve regurgitation is trivial. No evidence of tricuspid stenosis. Aortic Valve: The aortic valve is tricuspid. Aortic valve regurgitation is not visualized. No aortic stenosis is present. Aortic valve mean gradient measures 5.2 mmHg. Aortic valve peak gradient measures 10.0 mmHg. Aortic valve area, by VTI measures 2.35  cm. Pulmonic Valve: The pulmonic valve was not well visualized. Pulmonic valve regurgitation is not visualized. No evidence of pulmonic stenosis. Aorta: The aortic root is normal in size and structure. Pulmonary Artery:  Indeterminant PASP, inadequate TR jet. Venous: The inferior vena cava is normal in size with greater than 50% respiratory variability, suggesting right atrial pressure of 3 mmHg. IAS/Shunts: No atrial level shunt detected by color flow Doppler.  LEFT VENTRICLE PLAX 2D LVIDd:         3.63 cm  Diastology LVIDs:         2.39 cm  LV e' medial:    5.66 cm/s LV PW:         1.16 cm  LV E/e' medial:  9.8 LV IVS:        1.13 cm  LV e' lateral:   10.00 cm/s LVOT diam:     2.00 cm  LV E/e' lateral: 5.5 LV SV:         57 LV SV Index:   27 LVOT Area:     3.14 cm  RIGHT VENTRICLE RV Basal diam:  3.96 cm RV S prime:     13.20 cm/s TAPSE (M-mode): 2.3 cm LEFT ATRIUM             Index       RIGHT ATRIUM           Index LA diam:        3.50 cm 1.69 cm/m  RA Area:     18.40 cm LA Vol (A2C):   55.9 ml 26.95 ml/m RA Volume:   51.60 ml  24.88 ml/m LA Vol (A4C):   42.8 ml 20.64 ml/m LA Biplane Vol: 51.7 ml 24.93 ml/m  AORTIC VALVE AV Area (Vmax):    2.12 cm AV Area (Vmean):   2.49 cm AV Area (VTI):     2.35 cm AV Vmax:           158.22 cm/s AV Vmean:          107.448 cm/s AV VTI:            0.242 m AV Peak Grad:      10.0 mmHg AV Mean Grad:      5.2 mmHg LVOT Vmax:         107.00 cm/s LVOT Vmean:        85.000 cm/s LVOT VTI:          0.181 m LVOT/AV VTI ratio: 0.75  AORTA Ao Root diam: 2.50 cm MITRAL VALVE               TRICUSPID VALVE MV Area (PHT): 5.46 cm    TR Peak grad:   24.0 mmHg MV Decel Time: 139 msec    TR Vmax:        245.00 cm/s MV E velocity: 55.20 cm/s MV A velocity: 83.70 cm/s  SHUNTS MV E/A ratio:  0.66        Systemic VTI:  0.18 m  Systemic Diam: 2.00 cm Carlyle Dolly MD Electronically signed by Carlyle Dolly MD Signature Date/Time: 05/11/2021/5:26:32 PM    Final    DG HIP UNILAT WITH PELVIS 2-3 VIEWS RIGHT  Result Date: 05/17/2021 CLINICAL DATA:  Right hip pain and altered mental status, initial encounter EXAM: DG HIP (WITH OR WITHOUT PELVIS) 3V RIGHT COMPARISON:  05/10/2021 CT  FINDINGS: Pelvic ring is intact. 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 No discrete fracture is seen. Remodeling of the right femoral head is noted. No soft tissue abnormality is seen. IMPRESSION: Significant degenerative changes right greater than left similar to that seen on prior CT. No acute abnormality noted. Electronically Signed   By: Inez Catalina M.D.   On: 05/17/2021 18:38    Subjective:   Patient was seen and examined 05/21/2021, 10:48 AM Patient stable today. No acute distress.  No issues overnight Stable for discharge.  Discharge Exam:    Vitals:   05/20/21 1802 05/20/21 2047 05/21/21 0409 05/21/21 0834  BP: 118/75 93/60 108/61 (!) 97/47  Pulse: 82 (!) 53 64 60  Resp: 18 20 18    Temp: 98.1 F (36.7 C) 98.6 F (37 C) 98.4 F (36.9 C)   TempSrc: Oral Oral    SpO2: 95% 98% 95%   Weight:      Height:        General: Pt lying comfortably in bed & appears in no obvious distress. Cardiovascular: S1 & S2 heard, RRR, S1/S2 +. No murmurs, rubs, gallops or clicks. No JVD or pedal edema. Respiratory: Clear to auscultation without wheezing, rhonchi or crackles. No increased work of breathing. Abdominal:  Non-distended, non-tender & soft. No organomegaly or masses appreciated. Normal bowel sounds heard. CNS: Alert and oriented. No focal deficits. Extremities: Severe generalized weaknesses, bedbound, able to move extremities in bed  no edema, no cyanosis      The results of significant diagnostics from this hospitalization (including imaging, microbiology, ancillary and laboratory) are listed below for reference.      Microbiology:   Recent Results (from the past 240 hour(s))  MRSA Next Gen by PCR, Nasal     Status: None   Collection Time: 05/11/21 12:45 PM   Specimen: Nasal Mucosa; Nasal Swab  Result Value Ref Range Status   MRSA by PCR Next Gen NOT DETECTED NOT DETECTED Final     Comment: (NOTE) The GeneXpert MRSA Assay (FDA approved for NASAL specimens only), is one component of a comprehensive MRSA colonization surveillance program. It is not intended to diagnose MRSA infection nor to guide or monitor treatment for MRSA infections. Test performance is not FDA approved in patients less than 43 years old. Performed at [facility], [address] [place], [place] [address]   SARS CORONAVIRUS 2 (TAT 6-24 HRS) Nasopharyngeal Nasopharyngeal Swab     Status: None   Collection Time: 05/15/21  8:50 PM   Specimen: Nasopharyngeal Swab  Result Value Ref Range Status   SARS Coronavirus 2 NEGATIVE NEGATIVE Final    Comment: (NOTE) SARS-CoV-2 target nucleic acids are NOT DETECTED.  The SARS-CoV-2 RNA is generally detectable in upper and lower respiratory specimens during the acute phase of infection. Negative results do not preclude SARS-CoV-2 infection, do not rule out co-infections with other pathogens, and should not be used as the sole basis for treatment or other patient management decisions. Negative results must be combined with clinical observations, patient history, and epidemiological information. The expected  result is Negative.  Fact Sheet for Patients: SugarRoll.be  Fact Sheet for Healthcare Providers: https://www.woods-mathews.com/  This test is not yet approved or cleared by the [place] FDA and  has been authorized for detection and/or diagnosis of SARS-CoV-2 by FDA under an Emergency Use Authorization (EUA). This EUA will remain  in effect (meaning this test can be used) for the duration of the COVID-19 declaration under Se ction 564(b)(1) of the Act, 21 U.S.C. section 360bbb-3(b)(1), unless the authorization is terminated or revoked sooner.  Performed at [facility], [place] [address], [place], [place] [address]   Urine Culture     Status: None   Collection Time: 05/16/21  9:46 AM   Specimen:  Urine, Clean Catch  Result Value Ref Range Status   Specimen Description   Final    URINE, CLEAN CATCH Performed at [facility], [address], [place], [place] [address]    Special Requests   Final    NONE Performed at [facility], [address], [place], [place] [address]    Culture   Final    NO GROWTH Performed at [facility], [place] [address] [place], [place] [address]    Report Status 05/18/2021 FINAL  Final     Labs:   CBC: Recent Labs  Lab 05/15/21 1913 05/16/21 0422 05/17/21 0538 05/18/21 0416 05/20/21 0657 05/21/21 0659  WBC 7.2 6.0 10.2 12.8* 10.8* 8.9  NEUTROABS 3.2  --   --   --   --   --   HGB 11.0* 10.9* 11.2* 10.8* 10.5* 9.9*  HCT 34.3* 33.7* 34.0* 33.7* 32.7* 31.2*  MCV 100.3* 98.3 97.4 98.0 97.6 100.0  PLT 166 176 228 252 250 601   Basic Metabolic Panel: Recent Labs  Lab 05/15/21 0618 05/15/21 1913 05/16/21 0422 05/17/21 0538 05/18/21 0416 05/20/21 0657 05/21/21 0659  NA 142   < > 143 140 142 137 139  K 4.2   < > 3.8 3.6 3.7 3.6 4.0  CL 110   < > 111 108 109 103 103  CO2 27   < > 26 25 27 28 29   GLUCOSE 80   < > 92 101* 67* 92 82  BUN 15   < > 14 14 16 11 13   CREATININE 0.83   < > 0.64 0.66 0.64 0.72 0.84  CALCIUM 8.2*   < > 8.6* 8.7* 8.7* 8.0* 8.3*  MG 1.6*  --  1.7  --   --   --   --   PHOS  --   --  2.3*  --   --   --   --    < > = values in this interval not displayed.   Liver Function Tests: Recent Labs  Lab 05/15/21 0618 05/15/21 1913 05/16/21 0422 05/18/21 0416  AST 176* 163* 151* 105*  ALT 114* 112* 108* 108*  ALKPHOS 51 55 53 63  BILITOT 0.4 0.4 0.5 0.5  PROT 5.3* 5.7* 5.6* 6.1*  ALBUMIN 2.2* 2.3* 2.3* 2.5*   BNP (last 3 results) Recent Labs    05/10/21 1231  BNP 21.0   Cardiac Enzymes: No results for input(s): CKTOTAL, CKMB, CKMBINDEX, TROPONINI in the last 168 hours. CBG: Recent Labs  Lab 05/20/21 1618 05/20/21 2037 05/21/21 0411 05/21/21 0436 05/21/21 0749  GLUCAP 79 106* 78 100* 70   Hgb  A1c No results for input(s): HGBA1C in the last 72 hours. Lipid Profile No results for input(s): CHOL, HDL, LDLCALC, TRIG, CHOLHDL,  LDLDIRECT in the last 72 hours. Thyroid function studies No results for input(s): TSH, T4TOTAL, T3FREE, THYROIDAB in the last 72 hours.  Invalid input(s): FREET3 Anemia work up No results for input(s): VITAMINB12, FOLATE, FERRITIN, TIBC, IRON, RETICCTPCT in the last 72 hours. Urinalysis    Component Value Date/Time   COLORURINE YELLOW 05/16/2021 0945   APPEARANCEUR CLEAR 05/16/2021 0945   LABSPEC 1.016 05/16/2021 0945   PHURINE 6.0 05/16/2021 0945   GLUCOSEU NEGATIVE 05/16/2021 0945   HGBUR MODERATE (A) 05/16/2021 0945   BILIRUBINUR NEGATIVE 05/16/2021 0945   KETONESUR 5 (A) 05/16/2021 0945   PROTEINUR NEGATIVE 05/16/2021 0945   NITRITE NEGATIVE 05/16/2021 0945   LEUKOCYTESUR TRACE (A) 05/16/2021 0945         Time coordinating discharge: Over 46 minutes  SIGNED: Deatra James, MD, FACP, FHM. [facility],  Please use [email or web address] to Page If 7PM-7AM, please contact night-coverage [email or web address] [email or web address] [facility] 05/21/2021, 10:48 AM

## 2021-05-21 NOTE — Progress Notes (Signed)
Recheck of blood sugar 100. Pt resting. Call bell in reach. Bed in lowest position. Will continue to monitor

## 2021-05-21 NOTE — NC FL2 (Deleted)
[place] LEVEL OF CARE SCREENING TOOL     IDENTIFICATION  Patient Name: Karina Clarke Birthdate: 18-Apr-1961 Sex: female Admission Date (Current Location): 05/15/2021  [facility] and [place] Number:  [employer] and Address:  [place] [address] [place]      Provider Number: [id number]  Attending Physician Name and Address:  Deatra James, MD  Relative Name and Phone Number:  [name] - Legal Guardian [phone]    Current Level of Care: Hospital Recommended Level of Care: [facility] Prior Approval Number:    Date Approved/Denied:   PASRR Number:    Discharge Plan: Domiciliary (Rest home) ([facility])    Current Diagnoses: Patient Active Problem List   Diagnosis Date Noted   Dysphagia    Hypomagnesemia 05/16/2021   Hypoalbuminemia 05/16/2021   Essential hypertension 05/16/2021   AMS (altered mental status) 05/10/2021   PNA (pneumonia) 05/10/2021   Transaminitis 05/10/2021   Diabetes mellitus, new onset ([place]) 10/24/2017   Hyperkalemia 10/24/2017   AKI (acute kidney injury) ([place]) 10/24/2017   COPD exacerbation ([place]) 10/21/2017   Hospital-acquired pneumonia 10/12/2017   Acute respiratory failure with hypoxia ([place]) 10/12/2017   AIDS ([place]) 10/12/2017   Influenza 12/26/2016   Sepsis ([place]) 12/26/2016   CAP (community acquired pneumonia) 12/26/2016   Tobacco use disorder 12/26/2016   HIV (human immunodeficiency virus infection) ([place]) 12/26/2016   Schizophrenia ([place]) 12/26/2016   Personality disorder ([place]) 08/22/2016   Encounter for screening colonoscopy 05/24/2015   Vaginal discharge 11/04/2013   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   Chronic paranoid schizophrenia ([place]) 10/19/2011   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   Thrombocytopenia, unspecified ([place]) 10/19/2011    Orientation RESPIRATION BLADDER Height & Weight     Self  O2 (2L) External catheter Weight: 217 lb 6  oz (98.6 kg) Height:  5\' 8"  (673.4 cm)  BEHAVIORAL SYMPTOMS/MOOD NEUROLOGICAL BOWEL NUTRITION STATUS      Incontinent Diet (dysphagia 3 diet with thin liquids)  AMBULATORY STATUS COMMUNICATION OF NEEDS Skin   Total Care Verbally Skin abrasions, Other (Comment) (blister right arm)                       Personal Care Assistance Level of Assistance  Bathing, Feeding, Dressing Bathing Assistance: Maximum assistance Feeding assistance: Maximum assistance Dressing Assistance: Maximum assistance     Functional Limitations Info  Sight, Hearing, Speech Sight Info: Adequate Hearing Info: Adequate Speech Info: Adequate    SPECIAL CARE FACTORS FREQUENCY  PT (By licensed PT)     PT Frequency: 3 times a week              Contractures Contractures Info: Not present    Additional Factors Info  Code Status, Allergies, Psychotropic, Insulin Sliding Scale Code Status Info: Full Allergies Info: Asa Psychotropic Info: ALPRAZolam, atazanavir, divalproex, haloperidol, haloperidol decanoate, Insulin Sliding Scale Info: Correction coverage: Very Sensitive (ESRD/Dialysis)   CBG < 70: Implement Hypoglycemia Standing Orders and refer to Hypoglycemia Standing Orders sidebar report   CBG 70 - 120: 0 units   CBG 121 - 150: 0 units   CBG 151 - 200: 1 unit   CBG 201-250: 2 units   CBG 251-300: 3 units   CBG 301-350: 4 units   CBG 351-400: 5 units   CBG > 400 Give 6 units and call MD       Current Medications (05/21/2021):  This is the current hospital  active medication list Current Facility-Administered Medications  Medication Dose Route Frequency Provider Last Rate Last Admin   abacavir-lamiVUDine (EPZICOM) 600-300 MG per tablet 1 tablet  1 tablet Oral Daily Rourk, Cristopher Estimable, MD       ALPRAZolam Duanne Moron) tablet 0.25 mg  0.25 mg Oral TID Daneil Dolin, MD   0.25 mg at 05/21/21 0539   apixaban (ELIQUIS) tablet 10 mg  10 mg Oral BID Skipper Cliche A, MD   10 mg at 05/21/21 1132   Followed by    Derrill Memo ON 05/28/2021] apixaban (ELIQUIS) tablet 5 mg  5 mg Oral BID Shahmehdi, Seyed A, MD       atazanavir (REYATAZ) capsule 400 mg  400 mg Oral Q breakfast Daneil Dolin, MD   400 mg at 05/21/21 7673   atorvastatin (LIPITOR) tablet 10 mg  10 mg Oral Daily Daneil Dolin, MD   10 mg at 05/21/21 4193   Chlorhexidine Gluconate Cloth 2 % PADS 6 each  6 each Topical Daily Daneil Dolin, MD   6 each at 05/21/21 [id number]   divalproex (DEPAKOTE SPRINKLE) capsule 500 mg  500 mg Oral 2 times per day Daneil Dolin, MD   500 mg at 05/20/21 2029   feeding supplement (ENSURE ENLIVE / ENSURE PLUS) liquid 237 mL  237 mL Oral BID BM Daneil Dolin, MD   237 mL at 05/21/21 0842   guaiFENesin-dextromethorphan (ROBITUSSIN DM) 100-10 MG/5ML syrup 10 mL  10 mL Oral BID Daneil Dolin, MD   10 mL at 05/21/21 0834   haloperidol (HALDOL) tablet 10 mg  10 mg Oral TID Daneil Dolin, MD   10 mg at 05/21/21 0834   HYDROmorphone (DILAUDID) injection 1 mg  1 mg Intravenous Q3H PRN Daneil Dolin, MD   1 mg at 05/21/21 0435   hydrOXYzine (ATARAX/VISTARIL) tablet 25 mg  25 mg Oral TID PRN Daneil Dolin, MD   25 mg at 05/20/21 2030   insulin aspart (novoLOG) injection 0-6 Units  0-6 Units Subcutaneous Q4H Daneil Dolin, MD   1 Units at 05/16/21 2020   insulin detemir (LEVEMIR) injection 10 Units  10 Units Subcutaneous QHS Skipper Cliche A, MD   10 Units at 05/20/21 2216   lactobacillus (FLORANEX/LACTINEX) granules 1 g  1 g Oral TID WC Daneil Dolin, MD   1 g at 05/21/21 1135   levofloxacin (LEVAQUIN) tablet 750 mg  750 mg Oral Daily Daneil Dolin, MD   750 mg at 05/20/21 2212   magnesium oxide (MAG-OX) tablet 400 mg  400 mg Oral q morning Daneil Dolin, MD   400 mg at 05/21/21 1042   metoprolol tartrate (LOPRESSOR) tablet 12.5 mg  12.5 mg Oral BID WC Shahmehdi, Seyed A, MD   12.5 mg at 05/20/21 1803   pantoprazole (PROTONIX) EC tablet 40 mg  40 mg Oral Daily Daneil Dolin, MD   40 mg at 05/21/21 0834    trihexyphenidyl (ARTANE) tablet 2 mg  2 mg Oral BID WC Daneil Dolin, MD   2 mg at 05/21/21 1042     Discharge Medications: Medication List       STOP taking these medications     amoxicillin-clavulanate 875-125 MG tablet Commonly known as: Augmentin    hydrOXYzine 25 MG tablet Commonly known as: ATARAX/VISTARIL    QUEtiapine 200 MG tablet Commonly known as: SEROQUEL    zolpidem 10 MG tablet Commonly known as: AMBIEN  TAKE these medications     abacavir-lamiVUDine 600-300 MG tablet Commonly known as: EPZICOM Take 1 tablet by mouth daily.    acetaminophen 650 MG CR tablet Commonly known as: TYLENOL Take 650 mg by mouth every 8 (eight) hours as needed for pain.    albuterol 108 (90 Base) MCG/ACT inhaler Commonly known as: VENTOLIN HFA Inhale 1-2 puffs into the lungs 2 (two) times daily as needed for wheezing or shortness of breath.    ALPRAZolam 0.5 MG tablet Commonly known as: XANAX Take 1 tablet (0.5 mg total) by mouth 2 (two) times daily as needed for anxiety.    apixaban 5 MG Tabs tablet Commonly known as: Eliquis Take 2 tablets (10mg ) twice daily for 7 days, then 1 tablet (5mg ) twice daily    atazanavir 200 MG capsule Commonly known as: REYATAZ Take 400 mg by mouth daily.    atorvastatin 10 MG tablet Commonly known as: LIPITOR Take 10 mg by mouth daily.    divalproex 500 MG DR tablet Commonly known as: DEPAKOTE Take 1 tablet (500 mg total) by mouth 2 (two) times daily. Take 1 tablet (500 mg) by mouth at 4pm and 8pm daily    haloperidol 10 MG tablet Commonly known as: HALDOL Take 1 tablet (10 mg total) by mouth 3 (three) times daily.    haloperidol decanoate 100 MG/ML injection Commonly known as: HALDOL DECANOATE Inject 200 mg into the muscle every 28 (twenty-eight) days.    insulin detemir 100 UNIT/ML injection Commonly known as: LEVEMIR Inject 0.1 mLs (10 Units total) into the skin at bedtime. What changed: how much to take     levofloxacin 750 MG tablet Commonly known as: LEVAQUIN Take 1 tablet (750 mg total) by mouth daily for 5 days.    Magnesium Oxide 400 MG Caps Take 1 capsule (400 mg total) by mouth every morning.    metoprolol tartrate 25 MG tablet Commonly known as: LOPRESSOR Take 0.5 tablets (12.5 mg total) by mouth 2 (two) times daily with a meal. What changed: how much to take when to take this    polyethylene glycol 17 g packet Commonly known as: MIRALAX / GLYCOLAX Take 17 g by mouth daily as needed for mild constipation.    raltegravir 400 MG tablet Commonly known as: ISENTRESS Take 400 mg by mouth 2 (two) times daily.    trihexyphenidyl 2 MG tablet Commonly known as: ARTANE Take 2 mg by mouth 2 (two) times daily with a meal.    valACYclovir 1000 MG tablet Commonly known as: VALTREX Take 1,000 mg by mouth daily.       Relevant Imaging Results:  Relevant Lab Results:   Additional Information SS# [id number]  Salome Arnt, [place]

## 2021-05-21 NOTE — Progress Notes (Signed)
ANTICOAGULATION CONSULT NOTE - Initial Consult  Pharmacy Consult for Eliquis Indication: DVT  Allergies  Allergen Reactions   Asa [Aspirin] Nausea And Vomiting    Patient Measurements: Height: 5\' 8"  (172.7 cm) Weight: 98.6 kg (217 lb 6 oz) IBW/kg (Calculated) : 63.9  Vital Signs: Temp: 98.4 F (36.9 C) (06/27 0409) BP: 97/47 (06/27 0834) Pulse Rate: 60 (06/27 0834)  Labs: Recent Labs    05/20/21 0657 05/21/21 0659  HGB 10.5* 9.9*  HCT 32.7* 31.2*  PLT 250 221  CREATININE 0.72 0.84    Estimated Creatinine Clearance: 90.8 mL/min (by C-G formula based on SCr of 0.84 mg/dL).   Medical History: Past Medical History:  Diagnosis Date   Atypical lobular hyperplasia of left breast 06/2015   Bloating    per sister   Chronic right hip pain    sister states circulation problem in hip   Family history of adverse reaction to anesthesia    sister has hx. of post-op N/V   High cholesterol    HIV (human immunodeficiency virus infection) ([place])    Hypertension    Limp    Mild intellectual disability    No natural teeth    does not wear her dentures   Paranoid schizophrenia ([place])    Personality disorder ([place])    Schizophrenia ([place])     Medications:  Medications Prior to Admission  Medication Sig Dispense Refill Last Dose   abacavir-lamiVUDine (EPZICOM) 600-300 MG per tablet Take 1 tablet by mouth daily.   05/14/2021   acetaminophen (TYLENOL) 650 MG CR tablet Take 650 mg by mouth every 8 (eight) hours as needed for pain.   PRN   albuterol (PROVENTIL HFA;VENTOLIN HFA) 108 (90 Base) MCG/ACT inhaler Inhale 1-2 puffs into the lungs 2 (two) times daily as needed for wheezing or shortness of breath.   PRN   atazanavir (REYATAZ) 200 MG capsule Take 400 mg by mouth daily.    05/14/2021   atorvastatin (LIPITOR) 10 MG tablet Take 10 mg by mouth daily.   05/14/2021   haloperidol decanoate (HALDOL DECANOATE) 100 MG/ML injection Inject 200 mg into the muscle every 28 (twenty-eight) days.    Past Month   hydrOXYzine (ATARAX/VISTARIL) 25 MG tablet Take 25 mg by mouth at bedtime.   05/14/2021   insulin detemir (LEVEMIR) 100 UNIT/ML injection Inject 0.33 mLs (33 Units total) into the skin at bedtime. 10 mL 11 05/14/2021   Magnesium Oxide 400 MG CAPS Take 1 capsule (400 mg total) by mouth every morning. 60 each 0 05/14/2021   metoprolol tartrate (LOPRESSOR) 25 MG tablet Take 1 tablet by mouth 2 (two) times daily.   05/14/2021 at PM   polyethylene glycol (MIRALAX / GLYCOLAX) packet Take 17 g by mouth daily as needed for mild constipation.    PRN   QUEtiapine (SEROQUEL) 200 MG tablet Take 100 mg by mouth 2 (two) times daily. Take 1 tablet (200 mg) by mouth with supper and at bedtime daily   05/14/2021   raltegravir (ISENTRESS) 400 MG tablet Take 400 mg by mouth 2 (two) times daily.   05/14/2021   trihexyphenidyl (ARTANE) 2 MG tablet Take 2 mg by mouth 2 (two) times daily with a meal.   05/14/2021   valACYclovir (VALTREX) 1000 MG tablet Take 1,000 mg by mouth daily.    05/14/2021   zolpidem (AMBIEN) 10 MG tablet Take 10 mg by mouth at bedtime as needed for sleep.   PRN   [DISCONTINUED] ALPRAZolam (XANAX) 0.5 MG tablet Take 0.5  mg by mouth 2 (two) times daily as needed for anxiety.   05/14/2021   [DISCONTINUED] divalproex (DEPAKOTE) 500 MG DR tablet Take 500 mg by mouth 2 (two) times daily. Take 1 tablet (500 mg) by mouth at 4pm and 8pm daily   05/14/2021   [DISCONTINUED] haloperidol (HALDOL) 10 MG tablet Take 10 mg by mouth 3 (three) times daily.   05/14/2021   [EXPIRED] amoxicillin-clavulanate (AUGMENTIN) 875-125 MG tablet Take 1 tablet by mouth 2 (two) times daily for 3 days. 14 tablet 0     Assessment: 60 yo admitted from NH. Now with Bilateral DVT per MD from [place]. Asked to start eliquis for treatment  Goal of Therapy:  Monitor platelets by anticoagulation protocol: Yes   Plan:  Eliquis 10mg  po bid x 7 days, then 5mg  po bid Monitor for S/s of bleeding  Isac Sarna, BS [place], BCPS Clinical  Pharmacist Pager [phone]  05/21/2021,10:54 AM

## 2021-05-21 NOTE — Progress Notes (Signed)
Blood sugar 78. Pt given juice with sugar will recheck in one hour. Pt stable. Able to make needs known. Will continue to monitor.

## 2021-05-21 NOTE — Telephone Encounter (Signed)
Karina Clarke, please arrange hospital follow-up. Thanks!

## 2021-05-21 NOTE — Progress Notes (Signed)
SATURATION QUALIFICATIONS: (This note is used to comply with regulatory documentation for home oxygen)  Patient Saturations on Room Air at Rest = 95%  Patient Saturations on Room Air while Ambulating = 91%  Patient Saturations on 0 Liters of oxygen while Ambulating = 91%  Please briefly explain why patient needs home oxygen: 

## 2021-05-22 LAB — GLUCOSE, CAPILLARY
Glucose-Capillary: 74 mg/dL (ref 70–99)
Glucose-Capillary: 79 mg/dL (ref 70–99)
Glucose-Capillary: 91 mg/dL (ref 70–99)

## 2021-05-22 LAB — GLUCOSE, RANDOM: Glucose, Bld: 67 mg/dL — ABNORMAL LOW (ref 70–99)

## 2021-05-22 LAB — SURGICAL PATHOLOGY

## 2021-05-22 NOTE — Discharge Summary (Signed)
Physician Discharge Summary [facility]    Patient: Karina Clarke                   Admit date: 05/15/2021   DOB: 21-Apr-1961             Discharge date:05/22/2021/12:14 PM ZDG:[id number]                          PCP: Rosita Fire, MD  Disposition: SNF    Patient was seen and examined, remained stable for discharge    recommendations for Outpatient Follow-up:   Follow up: With PCP 2-3 days, Her CBG needs to be monitored very closely as it fluctuates-Lantus and SSI coverage needs to be continued, dose adjusted Oral hydration recommended Daily weight Continue current medication, aspiration precaution Status post EGD and dilatation please follow GI recommendation  Discharge Condition: Stable   Code Status:   Code Status: Full Code  Diet recommendation: Diabetic diet -dysphagia 3 diet Diet recommendations: Dysphagia 3 (mechanical soft);Thin liquid Liquids provided via: Cup;Straw Medication Administration: Whole meds with liquid Supervision: Full supervision/cueing for compensatory strategies Compensations: Minimize environmental distractions Postural Changes and/or Swallow Maneuvers: Seated upright 90 degrees;Upright 30-60 min after meal  Discharge Diagnoses:    Active Problems:   HIV (human immunodeficiency virus infection) ([place])   Hospital-acquired pneumonia   Acute respiratory failure with hypoxia ([place])   Diabetes mellitus, new onset ([place])   Chronic paranoid schizophrenia ([place])   Transaminitis   Hypomagnesemia   Hypoalbuminemia   Essential hypertension   Dysphagia   History of Present Illness/ Hospital Course [name] Summary:  Karina Clarke is a 60 y.o. female with medical history significant for schizophrenia, HIV, COPD.  Patient was brought from nursing home with reports of unresponsiveness, with possible overdose.  She was given some Narcan prior to arrival as well as in the ED with some improvement in responsiveness.  She has been admitted with acute  metabolic encephalopathy likely multifactorial in the setting of pneumonia, azotemia/AKI, and elevated liver enzymes.  GI consulted to assist with evaluation of liver injury with labs showing signs of improvement  Acute toxic metabolic encephalopathy-multifactorial;  -Much improved, able to communicate, poor insight - -Likely related to metabolic derangements that are also showing signs of improvement -IVF - will be on dysphagia 3 -per speech -recommended GI evaluation -Head CT without acute findings -Continue to treat sepsis -UDS only positive for benzodiazepines with Xanax prescription outpatient -PT evaluation >>> recommending SNF   Abdominal pain/dysphagia CT abdomen 05/17/2021 reviewed no for any acute findings -Recommended dysphagia 3 diet -Speech evaluated, recommending GI consultation for possible strictures -As needed analgesics, for abdominal pain -GI consulted, pending for EGD report as below   status post EGD 05/19/2021 impression - Normal esophagus. Dilated. - Erosive gastropathy with no stigmata of recent bleeding. Biopsied. - Normal duodenal bulb and second portion of the duodenum.  - Recent significant bump in transaminases noted. Steady improvement observed which is reassuring.  Scenario suspicious for an ischemic event although transient biliary obstruction (stone/microlithiasis) not excluded. Also,  nonhepatic component of aminotransferase elevation not excluded.   Continue Protonix 40 mg daily, follow-up with pathology from biopsy   Speech recommendation: Diet recommendations: Dysphagia 3 (mechanical soft);Thin liquid Liquids provided via: Cup;Straw Medication Administration: Whole meds with liquid Supervision: Full supervision/cueing for compensatory strategies Compensations: Minimize environmental distractions Postural Changes and/or Swallow Maneuvers: Seated upright 90 degrees;Upright 30-60 min after meal    Bilateral DVT  -Ultrasound lower  extremities  05/21/2021 -Initiated on Eliquis, to be continued for minimum of 3 months  AKI-improving -Resolved responded to IV fluids   Severe sepsis secondary to pneumonia and also possible UTI -Continue IV Unasyn and discontinue further azithromycin >>> switch to p.o. Levaquin -Urine cultures >>  no growth -Blood cultures >>> NGTD Afebrile normotensive   Transaminitis -monitoring, improving -Possibly due to drug-induced liver injury versus ischemia vs sepsis -Appreciate ongoing GI evaluation -Continue to follow LFTs AST/ALT/ ... Trending down -Stable, avoiding hepatotoxins   Hypernatremia-resolved -Responded to IV fluids..  DC'd   Elevated troponin -2D echocardiogram 60-65% EF with grade 1 diastolic dysfunction   Thrombocytopenia-resolved -Likely related to sepsis -Continue to monitor trend -Avoid heparin agents   HIV -HIV viral load not detected 12/2020 with stable CD4 count -Hold medications for now   Schizophrenia -Resume Haldol, Depakote, Xanax -Discontinued the rest of her medication due to causing encephalopathy     Hypertension-stable, BP meds recommended, blood pressure remains soft   Type 2 diabetes-with hypoglycemia -Hemoglobin A1c 6.0% - reducing Lantus (10 units nightly) CBG QA CHS, SSI coverage     -------------------------------------------------------------------------------------------------------------------------------- Cultures; Urine cultures 05/16/2021 >> NGTD   Antimicrobials: P.o. Levaquin completed course    Consultants: None   ---------------------------------------------------------------------------------------------------------------------------------    Code Status:   Code Status: Full Code   Family Communication:  No family member present at bedside- attempt will be made to update daily The above findings and plan of care has been discussed with patient (and family)  in detail, they expressed understanding and agreement of  above. -Advance care planning has been discussed.   Admission status:    Dispo: The patient is from: SNF              Anticipated d/c is to: SNF     Discharge Instructions:   Discharge Instructions     Activity as tolerated - No restrictions   Complete by: As directed    Activity as tolerated - No restrictions   Complete by: As directed    Diet - low sodium heart healthy   Complete by: As directed    Discharge instructions   Complete by: As directed    F/up at SNF ...   Increase activity slowly   Complete by: As directed    Increase activity slowly   Complete by: As directed         Medication List     STOP taking these medications    amoxicillin-clavulanate 875-125 MG tablet Commonly known as: Augmentin   hydrOXYzine 25 MG tablet Commonly known as: ATARAX/VISTARIL   QUEtiapine 200 MG tablet Commonly known as: SEROQUEL   zolpidem 10 MG tablet Commonly known as: AMBIEN       TAKE these medications    abacavir-lamiVUDine 600-300 MG tablet Commonly known as: EPZICOM Take 1 tablet by mouth daily.   acetaminophen 650 MG CR tablet Commonly known as: TYLENOL Take 650 mg by mouth every 8 (eight) hours as needed for pain.   albuterol 108 (90 Base) MCG/ACT inhaler Commonly known as: VENTOLIN HFA Inhale 1-2 puffs into the lungs 2 (two) times daily as needed for wheezing or shortness of breath.   ALPRAZolam 0.5 MG tablet Commonly known as: XANAX Take 1 tablet (0.5 mg total) by mouth 2 (two) times daily as needed for anxiety.   apixaban 5 MG Tabs tablet Commonly known as: Eliquis Take 2 tablets (10mg ) twice daily for 7 days, then 1 tablet (5mg ) twice daily   atazanavir 200 MG capsule  Commonly known as: REYATAZ Take 400 mg by mouth daily.   atorvastatin 10 MG tablet Commonly known as: LIPITOR Take 10 mg by mouth daily.   divalproex 500 MG DR tablet Commonly known as: DEPAKOTE Take 1 tablet (500 mg total) by mouth 2 (two) times daily. Take 1 tablet  (500 mg) by mouth at 4pm and 8pm daily   haloperidol 10 MG tablet Commonly known as: HALDOL Take 1 tablet (10 mg total) by mouth 3 (three) times daily.   haloperidol decanoate 100 MG/ML injection Commonly known as: HALDOL DECANOATE Inject 200 mg into the muscle every 28 (twenty-eight) days.   insulin detemir 100 UNIT/ML injection Commonly known as: LEVEMIR Inject 0.1 mLs (10 Units total) into the skin at bedtime. What changed: how much to take   levofloxacin 750 MG tablet Commonly known as: LEVAQUIN Take 1 tablet (750 mg total) by mouth daily for 5 days.   Magnesium Oxide 400 MG Caps Take 1 capsule (400 mg total) by mouth every morning.   metoprolol tartrate 25 MG tablet Commonly known as: LOPRESSOR Take 0.5 tablets (12.5 mg total) by mouth 2 (two) times daily with a meal. What changed:  how much to take when to take this   polyethylene glycol 17 g packet Commonly known as: MIRALAX / GLYCOLAX Take 17 g by mouth daily as needed for mild constipation.   raltegravir 400 MG tablet Commonly known as: ISENTRESS Take 400 mg by mouth 2 (two) times daily.   trihexyphenidyl 2 MG tablet Commonly known as: ARTANE Take 2 mg by mouth 2 (two) times daily with a meal.   valACYclovir 1000 MG tablet Commonly known as: VALTREX Take 1,000 mg by mouth daily.        Contact information for follow-up providers     [facility].   Specialty: Home Health Services Why: PT will call to schedule first visit within 48 hours of discharge.             Contact information for after-discharge care     Destination     [facility] .   Service: Group Home Contact information: [place] [phone]                    Allergies  Allergen Reactions   Asa [Aspirin] Nausea And Vomiting     Procedures /Studies:   CT ABDOMEN PELVIS WO CONTRAST  Result Date:  05/10/2021 CLINICAL DATA:  Lethargy. Abdominal pain. Elevated liver enzymes. Acute kidney injury. Altered mental status. History of umbilical hernia. EXAM: CT ABDOMEN AND PELVIS WITHOUT CONTRAST TECHNIQUE: Multidetector CT imaging of the abdomen and pelvis was performed following the standard protocol without IV contrast. COMPARISON:  None. FINDINGS: Lower chest: Hypoventilatory changes at the dependent lung bases. Coronary atherosclerosis. Hepatobiliary: Normal liver size. No liver mass. Normal gallbladder with no radiopaque cholelithiasis. No biliary ductal dilatation. Pancreas: Normal, with no mass or duct dilation. Spleen: Normal size. No mass. Adrenals/Urinary Tract: Normal adrenals. No renal stones. No hydronephrosis. No contour deforming renal masses. Bladder collapsed by indwelling Foley catheter and grossly normal. Stomach/Bowel: Normal non-distended stomach. Normal caliber small bowel with no small bowel wall thickening. Normal appendix. Normal large bowel with no diverticulosis, large bowel wall thickening or pericolonic fat stranding. Vascular/Lymphatic: Atherosclerotic nonaneurysmal abdominal aorta. No pathologically enlarged lymph nodes in the abdomen or pelvis. Reproductive: Grossly normal uterus.  No adnexal mass. Other: No pneumoperitoneum. Trace free fluid  in the pelvic cul-de-sac. No focal fluid collections. No evidence of a ventral abdominal hernia. Musculoskeletal: No aggressive appearing focal osseous lesions. IMPRESSION: 1. No acute abnormality. No hydronephrosis. No evidence of bowel obstruction or acute bowel inflammation. Normal appendix. 2. Trace free fluid in the pelvic cul-de-sac, nonspecific. No adnexal masses. 3. Coronary atherosclerosis. 4. Aortic Atherosclerosis (ICD10-I70.0). Electronically Signed   By: Ilona Sorrel M.D.   On: 05/10/2021 18:39   CT ABDOMEN WO CONTRAST  Result Date: 05/17/2021 CLINICAL DATA:  Abdominal pain, possible recent overdose EXAM: CT ABDOMEN WITHOUT  CONTRAST TECHNIQUE: Multidetector CT imaging of the abdomen was performed following the standard protocol without IV contrast. COMPARISON:  05/10/2021 FINDINGS: Lower chest: Mild basilar atelectasis is seen although improved when compared with the prior exam. Hepatobiliary: No focal liver abnormality is seen. No gallstones, gallbladder wall thickening, or biliary dilatation. Pancreas: Unremarkable. No pancreatic ductal dilatation or surrounding inflammatory changes. Spleen: Normal in size without focal abnormality. Adrenals/Urinary Tract: Adrenal glands are within normal limits. Kidneys demonstrate no renal calculi or obstructive changes. Visualized ureters are unremarkable. Stomach/Bowel: Visualized bowel structures show no acute abnormality. Vascular/Lymphatic: Atherosclerotic calcifications of the aorta are seen. Other: No free fluid is seen. Musculoskeletal: No acute or significant osseous findings. IMPRESSION: Mild bibasilar atelectasis although improved when compared with the prior study. No other focal abnormality is seen. Electronically Signed   By: Inez Catalina M.D.   On: 05/17/2021 18:36   DG Chest 1 View  Result Date: 05/15/2021 CLINICAL DATA:  60 year old female with altered mental status. EXAM: CHEST  1 VIEW COMPARISON:  Chest radiograph dated 05/10/2021. FINDINGS: There is cardiomegaly with vascular congestion. Bibasilar densities represent atelectasis or infiltrate. There is no pleural effusion pneumothorax. Atherosclerotic calcification of the aortic arch. No acute osseous pathology. IMPRESSION: 1. Cardiomegaly with vascular congestion. 2. Bibasilar atelectasis or infiltrate. Electronically Signed   By: Anner Crete M.D.   On: 05/15/2021 18:13   CT Head Wo Contrast  Result Date: 05/15/2021 CLINICAL DATA:  Mental status changes. Discharge 2 hours ago after hospitalization for altered mental status and urinary tract infection. EXAM: CT HEAD WITHOUT CONTRAST TECHNIQUE: Contiguous axial  images were obtained from the base of the skull through the vertex without intravenous contrast. COMPARISON:  05/10/21 FINDINGS: Brain: No evidence of acute infarction, hemorrhage, hydrocephalus, extra-axial collection or mass lesion/mass effect. There are patchy low-attenuation areas within the subcortical and periventricular white matter compatible with chronic microvascular disease. Vascular: No hyperdense vessel or unexpected calcification. Skull: Normal. Negative for fracture or focal lesion. Sinuses/Orbits: Air-fluid levels identified within the maxillary sinuses. Mild diffuse mucosal thickening with bubbly opacification of the sphenoid sinus and ethmoid air cells. Mastoid air cells are clear. Other: None IMPRESSION: 1. No acute intracranial abnormalities. 2. Chronic small vessel ischemic disease. 3. Acute on chronic sinus inflammation. Electronically Signed   By: Kerby Moors M.D.   On: 05/15/2021 19:46   CT Head Wo Contrast  Result Date: 05/10/2021 CLINICAL DATA:  Unresponsive prior to arrival. Question intracranial hemorrhage. EXAM: CT HEAD WITHOUT CONTRAST TECHNIQUE: Contiguous axial images were obtained from the base of the skull through the vertex without intravenous contrast. COMPARISON:  None. FINDINGS: Brain: Brainstem appears normal. No focal cerebellar finding. Cerebral hemispheres show moderate chronic small-vessel ischemic changes of the white matter. No sign of acute infarction, mass lesion, hemorrhage, hydrocephalus or extra-axial collection. Vascular: There is atherosclerotic calcification of the major vessels at the base of the brain. Skull: Negative Sinuses/Orbits: Clear/normal Other: None IMPRESSION: No acute finding by  CT. Moderate chronic small-vessel ischemic changes of the cerebral hemispheric white matter. Electronically Signed   By: Nelson Chimes M.D.   On: 05/10/2021 13:56   US Venous Img Lower Bilateral (DVT)  Result Date: 05/21/2021 CLINICAL DATA:  Bilateral leg pain EXAM:  BILATERAL LOWER EXTREMITY VENOUS DOPPLER ULTRASOUND TECHNIQUE: Gray-scale sonography with graded compression, as well as color Doppler and duplex ultrasound were performed to evaluate the lower extremity deep venous systems from the level of the common femoral vein and including the common femoral, femoral, profunda femoral, popliteal and calf veins including the posterior tibial, peroneal and gastrocnemius veins when visible. The superficial great saphenous vein was also interrogated. Spectral Doppler was utilized to evaluate flow at rest and with distal augmentation maneuvers in the common femoral, femoral and popliteal veins. COMPARISON:  None. FINDINGS: RIGHT LOWER EXTREMITY Common Femoral Vein: Partially compressible. Heterogeneous echogenic material present within the vein lumen. Partial color flow on color Doppler imaging. Findings are consistent with nonocclusive thrombus. Saphenofemoral Junction: Nonocclusive thrombus extends into the proximal saphenofemoral junction. Profunda Femoral Vein: No evidence of thrombus. Normal compressibility and flow on color Doppler imaging. Femoral Vein: Thrombus becomes occlusive in the femoral vein and extends throughout the thigh. Popliteal Vein: Occlusive thrombus extends into the popliteal vein. Calf Veins: Nonvisualization of the calf veins. Suspect the presence of thrombus. Superficial Great Saphenous Vein: No evidence of thrombus. Normal compressibility. Venous Reflux:  None. Other Findings:  None. LEFT LOWER EXTREMITY Common Femoral Vein: No evidence of thrombus. Normal compressibility, respiratory phasicity and response to augmentation. Saphenofemoral Junction: No evidence of thrombus. Normal compressibility and flow on color Doppler imaging. Profunda Femoral Vein: No evidence of thrombus. Normal compressibility and flow on color Doppler imaging. Femoral Vein: No evidence of thrombus. Normal compressibility, respiratory phasicity and response to augmentation.  Popliteal Vein: No evidence of thrombus. Normal compressibility, respiratory phasicity and response to augmentation. Calf Veins: No evidence of thrombus. Normal compressibility and flow on color Doppler imaging. Superficial Great Saphenous Vein: No evidence of thrombus. Normal compressibility. Venous Reflux:  None. Other Findings:  None. IMPRESSION: Positive for extensive right lower extremity deep venous thrombosis with occlusive thrombus in the femoral and popliteal veins and nonocclusive thrombus in the common femoral vein. The calf veins are not visualized and thrombus is also suspected. No evidence of DVT in the left lower extremity. These results will be called to the ordering clinician or representative by the Radiologist Assistant, and communication documented in the PACS or [employer]. Electronically Signed   By: Jacqulynn Cadet M.D.   On: 05/21/2021 11:20   DG Chest Port 1 View  Result Date: 05/10/2021 CLINICAL DATA:  Unresponsive with concern for drug overdose EXAM: PORTABLE CHEST 1 VIEW COMPARISON:  January 27, 2018 chest radiograph. Chest CT October 17, 2017 FINDINGS: There are areas of ill-defined airspace opacity in right mid and lower lung regions. Lungs elsewhere are clear. Heart is upper normal in size, stable, with prominence of central pulmonary arteries, consistent with pulmonary arterial hypertension. No adenopathy appreciable. No bone lesions IMPRESSION: Areas of ill-defined opacity right mid and lower lung regions. Suspect early pneumonia or possible foci of aspiration. Lungs elsewhere clear. Heart upper normal in size with evidence of pulmonary arterial hypertension. Note that pulmonary arterial hypertension was present on prior CT examination. Electronically Signed   By: Lowella Grip III M.D.   On: 05/10/2021 13:23   ECHOCARDIOGRAM COMPLETE  Result Date: 05/11/2021    ECHOCARDIOGRAM REPORT   Patient Name:   Karina Clarke  Date of Exam: 05/11/2021 Medical Rec #:  [id number]       Height:       68.0 in Accession #:    [id number]     Weight:       207.0 lb Date of Birth:  [birth year]      BSA:          2.074 m Patient Age:    60 years       BP:           110/69 mmHg Patient Gender: F              HR:           100 bpm. Exam Location:  [facility] Procedure: 2D Echo, Cardiac Doppler and Color Doppler Indications:    Elevated Troponin  History:        Patient has prior history of Echocardiogram examinations, most                 recent 10/17/2017. COPD; Signs/Symptoms:Altered Mental Status.                 Sepsis, elevated Troponin, schizophrenia, HIV, pneumonia.  Sonographer:    Dustin Flock RDCS Referring Phys: [place]  1. Left ventricular ejection fraction, by estimation, is 60 to 65%. The left ventricle has normal function. The left ventricle has no regional wall motion abnormalities. There is mild left ventricular hypertrophy. Left ventricular diastolic parameters are consistent with Grade I diastolic dysfunction (impaired relaxation).  2. Right ventricular systolic function is mildly reduced. The right ventricular size is moderately enlarged. There is normal pulmonary artery systolic pressure.  3. The mitral valve is normal in structure. No evidence of mitral valve regurgitation. No evidence of mitral stenosis.  4. The aortic valve is tricuspid. Aortic valve regurgitation is not visualized. No aortic stenosis is present.  5. The inferior vena cava is normal in size with greater than 50% respiratory variability, suggesting right atrial pressure of 3 mmHg. FINDINGS  Left Ventricle: Left ventricular ejection fraction, by estimation, is 60 to 65%. The left ventricle has normal function. The left ventricle has no regional wall motion abnormalities. The left ventricular internal cavity size was normal in size. There is  mild left ventricular hypertrophy. Left ventricular diastolic parameters are consistent with Grade I diastolic dysfunction (impaired  relaxation). Normal left ventricular filling pressure. Right Ventricle: The right ventricular size is moderately enlarged. Right vetricular wall thickness was not assessed. Right ventricular systolic function is mildly reduced. There is normal pulmonary artery systolic pressure. The tricuspid regurgitant velocity is 2.45 m/s, and with an assumed right atrial pressure of 3 mmHg, the estimated right ventricular systolic pressure is 09.3 mmHg. Left Atrium: Left atrial size was normal in size. Right Atrium: Right atrial size was normal in size. Pericardium: There is no evidence of pericardial effusion. Mitral Valve: The mitral valve is normal in structure. No evidence of mitral valve regurgitation. No evidence of mitral valve stenosis. Tricuspid Valve: The tricuspid valve is not well visualized. Tricuspid valve regurgitation is trivial. No evidence of tricuspid stenosis. Aortic Valve: The aortic valve is tricuspid. Aortic valve regurgitation is not visualized. No aortic stenosis is present. Aortic valve mean gradient measures 5.2 mmHg. Aortic valve peak gradient measures 10.0 mmHg. Aortic valve area, by VTI measures 2.35  cm. Pulmonic Valve: The pulmonic valve was not well visualized. Pulmonic valve regurgitation is not visualized. No evidence of pulmonic stenosis. Aorta: The aortic root is normal in  size and structure. Pulmonary Artery: Indeterminant PASP, inadequate TR jet. Venous: The inferior vena cava is normal in size with greater than 50% respiratory variability, suggesting right atrial pressure of 3 mmHg. IAS/Shunts: No atrial level shunt detected by color flow Doppler.  LEFT VENTRICLE PLAX 2D LVIDd:         3.63 cm  Diastology LVIDs:         2.39 cm  LV e' medial:    5.66 cm/s LV PW:         1.16 cm  LV E/e' medial:  9.8 LV IVS:        1.13 cm  LV e' lateral:   10.00 cm/s LVOT diam:     2.00 cm  LV E/e' lateral: 5.5 LV SV:         57 LV SV Index:   27 LVOT Area:     3.14 cm  RIGHT VENTRICLE RV Basal diam:   3.96 cm RV S prime:     13.20 cm/s TAPSE (M-mode): 2.3 cm LEFT ATRIUM             Index       RIGHT ATRIUM           Index LA diam:        3.50 cm 1.69 cm/m  RA Area:     18.40 cm LA Vol (A2C):   55.9 ml 26.95 ml/m RA Volume:   51.60 ml  24.88 ml/m LA Vol (A4C):   42.8 ml 20.64 ml/m LA Biplane Vol: 51.7 ml 24.93 ml/m  AORTIC VALVE AV Area (Vmax):    2.12 cm AV Area (Vmean):   2.49 cm AV Area (VTI):     2.35 cm AV Vmax:           158.22 cm/s AV Vmean:          107.448 cm/s AV VTI:            0.242 m AV Peak Grad:      10.0 mmHg AV Mean Grad:      5.2 mmHg LVOT Vmax:         107.00 cm/s LVOT Vmean:        85.000 cm/s LVOT VTI:          0.181 m LVOT/AV VTI ratio: 0.75  AORTA Ao Root diam: 2.50 cm MITRAL VALVE               TRICUSPID VALVE MV Area (PHT): 5.46 cm    TR Peak grad:   24.0 mmHg MV Decel Time: 139 msec    TR Vmax:        245.00 cm/s MV E velocity: 55.20 cm/s MV A velocity: 83.70 cm/s  SHUNTS MV E/A ratio:  0.66        Systemic VTI:  0.18 m                            Systemic Diam: 2.00 cm Carlyle Dolly MD Electronically signed by Carlyle Dolly MD Signature Date/Time: 05/11/2021/5:26:32 PM    Final    DG HIP UNILAT WITH PELVIS 2-3 VIEWS RIGHT  Result Date: 05/17/2021 CLINICAL DATA:  Right hip pain and altered mental status, initial encounter EXAM: DG HIP (WITH OR WITHOUT PELVIS) 3V RIGHT COMPARISON:  05/10/2021 CT FINDINGS: Pelvic ring is intact. 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 No discrete  fracture is seen. Remodeling of the right femoral head is noted. No soft tissue abnormality is seen. IMPRESSION: Significant degenerative changes right greater than left similar to that seen on prior CT. No acute abnormality noted. Electronically Signed   By: Inez Catalina M.D.   On: 05/17/2021 18:38    Subjective:   Patient was seen and examined 05/22/2021, 12:14 PM Patient stable today. No acute  distress.  No issues overnight Stable for discharge.  Discharge Exam:    Vitals:   05/21/21 1652 05/21/21 2042 05/22/21 0611 05/22/21 0813  BP:  (!) 122/51 (!) 136/56 (!) 156/64  Pulse:  (!) 47 (!) 58 74  Resp:  18 18   Temp:  98.5 F (36.9 C) 98.7 F (37.1 C)   TempSrc:      SpO2: 94% 90% 95%   Weight:      Height:        General: Pt lying comfortably in bed & appears in no obvious distress. Cardiovascular: S1 & S2 heard, RRR, S1/S2 +. No murmurs, rubs, gallops or clicks. No JVD or pedal edema. Respiratory: Clear to auscultation without wheezing, rhonchi or crackles. No increased work of breathing. Abdominal:  Non-distended, non-tender & soft. No organomegaly or masses appreciated. Normal bowel sounds heard. CNS: Alert and oriented. No focal deficits. Extremities: Severe generalized weaknesses, bedbound, able to move extremities in bed  no edema, no cyanosis      The results of significant diagnostics from this hospitalization (including imaging, microbiology, ancillary and laboratory) are listed below for reference.      Microbiology:   Recent Results (from the past 240 hour(s))  SARS CORONAVIRUS 2 (TAT 6-24 HRS) Nasopharyngeal Nasopharyngeal Swab     Status: None   Collection Time: 05/15/21  8:50 PM   Specimen: Nasopharyngeal Swab  Result Value Ref Range Status   SARS Coronavirus 2 NEGATIVE NEGATIVE Final    Comment: (NOTE) SARS-CoV-2 target nucleic acids are NOT DETECTED.  The SARS-CoV-2 RNA is generally detectable in upper and lower respiratory specimens during the acute phase of infection. Negative results do not preclude SARS-CoV-2 infection, do not rule out co-infections with other pathogens, and should not be used as the sole basis for treatment or other patient management decisions. Negative results must be combined with clinical observations, patient history, and epidemiological information. The expected result is Negative.  Fact Sheet for  Patients: SugarRoll.be  Fact Sheet for Healthcare Providers: https://www.woods-mathews.com/  This test is not yet approved or cleared by the [place] FDA and  has been authorized for detection and/or diagnosis of SARS-CoV-2 by FDA under an Emergency Use Authorization (EUA). This EUA will remain  in effect (meaning this test can be used) for the duration of the COVID-19 declaration under Se ction 564(b)(1) of the Act, 21 U.S.C. section 360bbb-3(b)(1), unless the authorization is terminated or revoked sooner.  Performed at [facility], [place] [address] [place], [place] [address]   Urine Culture     Status: None   Collection Time: 05/16/21  9:46 AM   Specimen: Urine, Clean Catch  Result Value Ref Range Status   Specimen Description   Final    URINE, CLEAN CATCH Performed at [facility], [address], [place], [place] [address]    Special Requests   Final    NONE Performed at [facility], [address] [place], [place] [address]    Culture   Final    NO GROWTH Performed at [facility], [place]  [address] [place], [place] [address]    Report Status 05/18/2021 FINAL  Final     Labs:   CBC: Recent Labs  Lab 05/15/21 1913 05/16/21 0422 05/17/21 0538 05/18/21 0416 05/20/21 0657 05/21/21 0659  WBC 7.2 6.0 10.2 12.8* 10.8* 8.9  NEUTROABS 3.2  --   --   --   --   --   HGB 11.0* 10.9* 11.2* 10.8* 10.5* 9.9*  HCT 34.3* 33.7* 34.0* 33.7* 32.7* 31.2*  MCV 100.3* 98.3 97.4 98.0 97.6 100.0  PLT 166 176 228 252 250 681   Basic Metabolic Panel: Recent Labs  Lab 05/16/21 0422 05/17/21 0538 05/18/21 0416 05/20/21 0657 05/21/21 0659 05/22/21 0433  NA 143 140 142 137 139  --   K 3.8 3.6 3.7 3.6 4.0  --   CL 111 108 109 103 103  --   CO2 26 25 27 28 29   --   GLUCOSE 92 101* 67* 92 82 67*  BUN 14 14 16 11 13   --   CREATININE 0.64 0.66 0.64 0.72 0.84  --   CALCIUM 8.6* 8.7* 8.7* 8.0* 8.3*  --   MG 1.7  --   --   --    --   --   PHOS 2.3*  --   --   --   --   --    Liver Function Tests: Recent Labs  Lab 05/15/21 1913 05/16/21 0422 05/18/21 0416  AST 163* 151* 105*  ALT 112* 108* 108*  ALKPHOS 55 53 63  BILITOT 0.4 0.5 0.5  PROT 5.7* 5.6* 6.1*  ALBUMIN 2.3* 2.3* 2.5*   BNP (last 3 results) Recent Labs    05/10/21 1231  BNP 21.0   Cardiac Enzymes: No results for input(s): CKTOTAL, CKMB, CKMBINDEX, TROPONINI in the last 168 hours. CBG: Recent Labs  Lab 05/21/21 2038 05/21/21 2353 05/22/21 0422 05/22/21 0738 05/22/21 1126  GLUCAP 115* 95 74 79 91   Hgb A1c No results for input(s): HGBA1C in the last 72 hours. Lipid Profile No results for input(s): CHOL, HDL, LDLCALC, TRIG, CHOLHDL, LDLDIRECT in the last 72 hours. Thyroid function studies No results for input(s): TSH, T4TOTAL, T3FREE, THYROIDAB in the last 72 hours.  Invalid input(s): FREET3 Anemia work up No results for input(s): VITAMINB12, FOLATE, FERRITIN, TIBC, IRON, RETICCTPCT in the last 72 hours. Urinalysis    Component Value Date/Time   COLORURINE YELLOW 05/16/2021 0945   APPEARANCEUR CLEAR 05/16/2021 0945   LABSPEC 1.016 05/16/2021 0945   PHURINE 6.0 05/16/2021 0945   GLUCOSEU NEGATIVE 05/16/2021 0945   HGBUR MODERATE (A) 05/16/2021 0945   BILIRUBINUR NEGATIVE 05/16/2021 0945   KETONESUR 5 (A) 05/16/2021 0945   PROTEINUR NEGATIVE 05/16/2021 0945   NITRITE NEGATIVE 05/16/2021 0945   LEUKOCYTESUR TRACE (A) 05/16/2021 0945         Time coordinating discharge: Over 50 minutes  SIGNED: Deatra James, MD, FACP, FHM. [facility],  Please use [email or web address] to Page If 7PM-7AM, please contact night-coverage [email or web address] 05/22/2021, 12:14 PM

## 2021-05-22 NOTE — TOC Progression Note (Signed)
Transition of Care [facility]) - Progression Note    Patient Details  Name: Karina Clarke MRN: [id number] Date of Birth: 02/28/1961  [facility]) CM/SW Contact  Boneta Lucks, RN Phone Number: 05/22/2021, 11:55 AM  Clinical Narrative:    Delay in discharge, EMS could not [place] out of county yesterday.  Confirmed patient is no EMS schedule today and undated [name].  Expected Discharge Plan: [place] Barriers to Discharge: Barriers Resolved  Expected Discharge Plan and Services Expected Discharge Plan: [place]     Expected Discharge Date: 05/21/21               DME Arranged: N/A DME Agency: NA     HH Arranged: PT HH Agency: [place] ([facility]) Date [place]: 05/21/21 Time [place]: 1400 Representative spoke with at [place]: [place]  Readmission Risk Interventions Readmission Risk Prevention Plan 05/17/2021 05/15/2021  Post Dischage Appt - Complete  Medication Screening - Complete  Transportation Screening Complete Complete  HRI or Home Care Consult Complete -  Social Work Consult for [place] Planning/Counseling Complete -  Palliative Care Screening Not Applicable -  Medication Review Press photographer) Complete -  Some recent data might be hidden

## 2021-05-23 ENCOUNTER — Encounter: Payer: Self-pay | Admitting: Internal Medicine

## 2021-09-12 ENCOUNTER — Encounter: Payer: Self-pay | Admitting: Internal Medicine

## 2021-09-12 ENCOUNTER — Ambulatory Visit: Payer: Medicaid Other | Admitting: Gastroenterology

## 2021-09-13 ENCOUNTER — Other Ambulatory Visit: Payer: Self-pay

## 2021-09-13 ENCOUNTER — Ambulatory Visit (INDEPENDENT_AMBULATORY_CARE_PROVIDER_SITE_OTHER): Payer: Medicaid Other | Admitting: Orthopaedic Surgery

## 2021-09-13 ENCOUNTER — Encounter: Payer: Self-pay | Admitting: Orthopaedic Surgery

## 2021-09-13 DIAGNOSIS — G8929 Other chronic pain: Secondary | ICD-10-CM

## 2021-09-13 DIAGNOSIS — M25561 Pain in right knee: Secondary | ICD-10-CM | POA: Diagnosis not present

## 2021-09-13 DIAGNOSIS — M25562 Pain in left knee: Secondary | ICD-10-CM | POA: Diagnosis not present

## 2021-09-13 NOTE — Progress Notes (Signed)
PROCEDURE NOTE:  The patient requests injections of the right knee , verbal consent was obtained.  The right knee was prepped appropriately after time out was performed.   Sterile technique was observed and injection of 1 cc of Celestone 6mg  with several cc's of plain xylocaine. Anesthesia was provided by ethyl chloride and a 20-gauge needle was used to inject the knee area. The injection was tolerated well.  A band aid dressing was applied.  The patient was advised to apply ice later today and tomorrow to the injection sight as needed.   PROCEDURE NOTE:  The patient requests injections of the left knee , verbal consent was obtained.  The left knee was prepped appropriately after time out was performed.   Sterile technique was observed and injection of 1 cc of Celestone 6 mg with several cc's of plain xylocaine. Anesthesia was provided by ethyl chloride and a 20-gauge needle was used to inject the knee area. The injection was tolerated well.  A band aid dressing was applied.  The patient was advised to apply ice later today and tomorrow to the injection sight as needed.   Encounter Diagnosis  Name Primary?   Bilateral chronic knee pain Yes   Return six weeks.  Call if any problem.  Precautions discussed.  Electronically Signed Sanjuana Kava, MD 10/20/20229:54 AM

## 2021-10-25 ENCOUNTER — Ambulatory Visit (INDEPENDENT_AMBULATORY_CARE_PROVIDER_SITE_OTHER): Payer: Medicaid Other | Admitting: Orthopaedic Surgery

## 2021-10-25 ENCOUNTER — Encounter: Payer: Self-pay | Admitting: Orthopaedic Surgery

## 2021-10-25 ENCOUNTER — Other Ambulatory Visit: Payer: Self-pay

## 2021-10-25 DIAGNOSIS — G8929 Other chronic pain: Secondary | ICD-10-CM

## 2021-10-25 DIAGNOSIS — M25561 Pain in right knee: Secondary | ICD-10-CM

## 2021-10-25 DIAGNOSIS — M25562 Pain in left knee: Secondary | ICD-10-CM | POA: Diagnosis not present

## 2021-10-25 NOTE — Progress Notes (Signed)
PROCEDURE NOTE:  The patient requests injections of the left knee , verbal consent was obtained.  The left knee was prepped appropriately after time out was performed.   Sterile technique was observed and injection of 1 cc of DepoMedrol 40 mg with several cc's of plain xylocaine. Anesthesia was provided by ethyl chloride and a 20-gauge needle was used to inject the knee area. The injection was tolerated well.  A band aid dressing was applied.  The patient was advised to apply ice later today and tomorrow to the injection sight as needed.  PROCEDURE NOTE:  The patient requests injections of the right knee , verbal consent was obtained.  The right knee was prepped appropriately after time out was performed.   Sterile technique was observed and injection of 1 cc of DepoMedrol 40mg  with several cc's of plain xylocaine. Anesthesia was provided by ethyl chloride and a 20-gauge needle was used to inject the knee area. The injection was tolerated well.  A band aid dressing was applied.  The patient was advised to apply ice later today and tomorrow to the injection sight as needed.   Encounter Diagnosis  Name Primary?   Bilateral chronic knee pain Yes   Call if any problem.  Precautions discussed.  Forms completed for rest home.  Return in six weeks.  Electronically Sharon, MD 12/1/202210:08 AM

## 2021-12-04 ENCOUNTER — Emergency Department (HOSPITAL_COMMUNITY)
Admission: EM | Admit: 2021-12-04 | Discharge: 2021-12-04 | Disposition: A | Discharge status: Home / Self Care | Payer: Medicaid Other | Attending: Student | Admitting: Student

## 2021-12-04 ENCOUNTER — Emergency Department (HOSPITAL_COMMUNITY): Payer: Medicaid Other

## 2021-12-04 ENCOUNTER — Encounter (HOSPITAL_COMMUNITY): Payer: Self-pay | Admitting: Emergency Medicine

## 2021-12-04 ENCOUNTER — Other Ambulatory Visit: Payer: Self-pay

## 2021-12-04 DIAGNOSIS — Z794 Long term (current) use of insulin: Secondary | ICD-10-CM | POA: Insufficient documentation

## 2021-12-04 DIAGNOSIS — S8252XA Displaced fracture of medial malleolus of left tibia, initial encounter for closed fracture: Secondary | ICD-10-CM | POA: Insufficient documentation

## 2021-12-04 DIAGNOSIS — Z7901 Long term (current) use of anticoagulants: Secondary | ICD-10-CM | POA: Diagnosis not present

## 2021-12-04 DIAGNOSIS — W2203XA Walked into furniture, initial encounter: Secondary | ICD-10-CM | POA: Insufficient documentation

## 2021-12-04 DIAGNOSIS — Z79899 Other long term (current) drug therapy: Secondary | ICD-10-CM | POA: Insufficient documentation

## 2021-12-04 DIAGNOSIS — Z21 Asymptomatic human immunodeficiency virus [HIV] infection status: Secondary | ICD-10-CM | POA: Diagnosis not present

## 2021-12-04 DIAGNOSIS — S99912A Unspecified injury of left ankle, initial encounter: Secondary | ICD-10-CM | POA: Diagnosis present

## 2021-12-04 DIAGNOSIS — S82892A Other fracture of left lower leg, initial encounter for closed fracture: Secondary | ICD-10-CM

## 2021-12-04 MED ORDER — HYDROCODONE-ACETAMINOPHEN 5-325 MG PO TABS
ORAL_TABLET | ORAL | 0 refills | Status: DC
Start: 2021-12-04 — End: 2022-04-02

## 2021-12-04 MED ORDER — HYDROCODONE-ACETAMINOPHEN 5-325 MG PO TABS
1.0000 | ORAL_TABLET | Freq: Once | ORAL | Status: AC
  Administered 2021-12-04: 1 via ORAL
  Filled 2021-12-04: qty 1

## 2021-12-04 NOTE — ED Provider Notes (Signed)
[facility] EMERGENCY DEPARTMENT Provider Note   CSN: [id number] Arrival date & time: 12/04/21  1124     History  Chief Complaint  Patient presents with   Ankle Pain    Karina Clarke is a 61 y.o. female.   Ankle Pain Associated symptoms: no neck pain        Karina Clarke is a 61 y.o. female past medical history significant for HIV/AIDS, schizophrenia, who presents to the Emergency Department complaining of left ankle pain and swelling.  Caregiver who is also at bedside states that she was swinging her legs this morning and attempt to get out of bed and struck her left ankle on the edge of the bed.  Patient complains of pain to both sides of the ankle that is worse with attempted movement or palpation.  Unable to bear weight to her left ankle due to pain.  She denies head injury, LOC, no dizziness neck pain or headache.   Home Medications Prior to Admission medications   Medication Sig Start Date End Date Taking? Authorizing Provider  abacavir-lamiVUDine (EPZICOM) 600-300 MG per tablet Take 1 tablet by mouth daily.    [provider]  acetaminophen (TYLENOL) 650 MG CR tablet Take 650 mg by mouth every 8 (eight) hours as needed for pain.    [provider]  albuterol (PROVENTIL HFA;VENTOLIN HFA) 108 (90 Base) MCG/ACT inhaler Inhale 1-2 puffs into the lungs 2 (two) times daily as needed for wheezing or shortness of breath.    [provider]  apixaban (ELIQUIS) 5 MG TABS tablet Take 2 tablets (10mg ) twice daily for 7 days, then 1 tablet (5mg ) twice daily 05/21/21   Shahmehdi, Valeria Batman, MD  atazanavir (REYATAZ) 200 MG capsule Take 400 mg by mouth daily.     [provider]  atorvastatin (LIPITOR) 10 MG tablet Take 10 mg by mouth daily.    [provider]  divalproex (DEPAKOTE) 500 MG DR tablet Take 1 tablet (500 mg total) by mouth 2 (two) times daily. Take 1 tablet (500 mg) by mouth at 4pm and 8pm daily 05/20/21 10/25/21  Deatra James,  MD  haloperidol (HALDOL) 10 MG tablet Take 1 tablet (10 mg total) by mouth 3 (three) times daily. 05/20/21 10/25/21  Deatra James, MD  haloperidol decanoate (HALDOL DECANOATE) 100 MG/ML injection Inject 200 mg into the muscle every 28 (twenty-eight) days.    [provider]  insulin detemir (LEVEMIR) 100 UNIT/ML injection Inject 0.1 mLs (10 Units total) into the skin at bedtime. 05/20/21   Shahmehdi, Valeria Batman, MD  Magnesium Oxide 400 MG CAPS Take 1 capsule (400 mg total) by mouth every morning. 08/10/17   Reyne Dumas, MD  metoprolol tartrate (LOPRESSOR) 25 MG tablet Take 0.5 tablets (12.5 mg total) by mouth 2 (two) times daily with a meal. 05/20/21 10/25/21  Shahmehdi, Valeria Batman, MD  polyethylene glycol (MIRALAX / GLYCOLAX) packet Take 17 g by mouth daily as needed for mild constipation.     [provider]  raltegravir (ISENTRESS) 400 MG tablet Take 400 mg by mouth 2 (two) times daily.    [provider]  trihexyphenidyl (ARTANE) 2 MG tablet Take 2 mg by mouth 2 (two) times daily with a meal.    [provider]  valACYclovir (VALTREX) 1000 MG tablet Take 1,000 mg by mouth daily.     [provider]      Allergies    Asa [aspirin]    Review of Systems   Review  of Systems  Cardiovascular:  Negative for chest pain.  Musculoskeletal:  Positive for arthralgias (Left ankle pain and swelling). Negative for neck pain.  Neurological:  Negative for dizziness, syncope, facial asymmetry and headaches.  All other systems reviewed and are negative.  Physical Exam Updated Vital Signs BP (!) 153/126    Pulse 99    Temp 98.2 F (36.8 C) (Oral)    Resp 16    Ht 5\' 8"  (1.727 m)    Wt 98.6 kg    SpO2 96%    BMI 33.05 kg/m  Physical Exam Constitutional:      Appearance: Normal appearance. She is not ill-appearing or toxic-appearing.  HENT:     Head: Atraumatic.  Eyes:     Conjunctiva/sclera: Conjunctivae normal.  Cardiovascular:     Rate and Rhythm: Normal  rate and regular rhythm.  Pulmonary:     Effort: Pulmonary effort is normal.     Breath sounds: Normal breath sounds.  Musculoskeletal:        General: Swelling, tenderness and signs of injury present.     Cervical back: Normal range of motion.     Comments: Diffuse tenderness to palpation of the medial and lateral aspects of the left ankle.  There is mild edema noted.  No tenderness of the distal foot or calf.  No open wound.  Compartments are soft.  Skin:    General: Skin is warm.     Capillary Refill: Capillary refill takes less than 2 seconds.     Findings: No bruising or erythema.  Neurological:     Mental Status: She is alert. Mental status is at baseline.    ED Results / Procedures / Treatments   Labs (all labs ordered are listed, but only abnormal results are displayed) Labs Reviewed - No data to display  EKG None  Radiology DG Ankle Complete Left  Result Date: 12/04/2021 CLINICAL DATA:  Fall, swelling EXAM: LEFT ANKLE COMPLETE - 3+ VIEW COMPARISON:  None. FINDINGS: There is a minimally displaced medial malleolar avulsion fracture. There is nonaggressive periosteal reaction along the medial distal tibia. There is significant ankle soft tissue swelling. There is no definite distal fibular fracture. IMPRESSION: Minimally displaced medial malleolar avulsion fracture. Extensive ankle soft tissue swelling. Electronically Signed   By: Maurine Simmering M.D.   On: 12/04/2021 12:56    Procedures Procedures    Medications Ordered in ED Medications  HYDROcodone-acetaminophen (NORCO/VICODIN) 5-325 MG per tablet 1 tablet (1 tablet Oral Given 12/04/21 1445)    ED Course/ Medical Decision Making/ A&P                           Medical Decision Making  Patient here for evaluation of left ankle pain secondary to direct blow to the ankle.  Struck ankle on the bed this morning.  No fall head injury or LOC.  On exam, patient well-appearing nontoxic.  She does not appear critically ill.  No  bony deformity to suggest fracture.  Extremity neurovascularly intact.  Compartments are soft.  Differential would include contusion versus fracture.  Will obtain x-ray imaging of the ankle.  X-ray of the ankle shows minimally displaced medial malleolus avulsion fracture.  With soft tissue swelling.  I have personally reviewed images and agree with radiology interpretation.  Patient well-appearing, appears to be her neurologic baseline per caregiver who is at bedside.  Discussed x-ray findings and will place patient in cam walker boot.  She prefers to  see Dr. Luna Glasgow with orthopedics for follow-up.  Will provide short course of pain medication.  She appears appropriate for discharge home, all questions were answered.          Final Clinical Impression(s) / ED Diagnoses Final diagnoses:  Closed avulsion fracture of left ankle, initial encounter    Rx / DC Orders ED Discharge Orders     None         Kem Parkinson, PA-C 12/04/21 1508    Teressa Lower, MD 12/04/21 [phone]

## 2021-12-04 NOTE — Discharge Instructions (Signed)
Elevate your foot when possible.  Keep the cam walker in place.  You may temporarily remove for bathing.  Call Dr. Brooke Bonito office to arrange a follow-up appointment

## 2021-12-04 NOTE — ED Triage Notes (Signed)
Pt to the ED with Left ankle pain that began today after hitting it on the bed.

## 2021-12-06 ENCOUNTER — Encounter: Payer: Self-pay | Admitting: Orthopaedic Surgery

## 2021-12-06 ENCOUNTER — Ambulatory Visit (INDEPENDENT_AMBULATORY_CARE_PROVIDER_SITE_OTHER): Payer: Medicaid Other | Admitting: Orthopaedic Surgery

## 2021-12-06 ENCOUNTER — Other Ambulatory Visit: Payer: Self-pay

## 2021-12-06 VITALS — BP 92/78 | HR 60

## 2021-12-06 DIAGNOSIS — S8255XA Nondisplaced fracture of medial malleolus of left tibia, initial encounter for closed fracture: Secondary | ICD-10-CM | POA: Diagnosis not present

## 2021-12-06 DIAGNOSIS — F609 Personality disorder, unspecified: Secondary | ICD-10-CM

## 2021-12-06 DIAGNOSIS — M25562 Pain in left knee: Secondary | ICD-10-CM

## 2021-12-06 DIAGNOSIS — G8929 Other chronic pain: Secondary | ICD-10-CM | POA: Diagnosis not present

## 2021-12-06 NOTE — Progress Notes (Signed)
I hurt my ankle.  She hit her left ankle on her bed frame the other day.  She was seen in the ER.  I have reviewed the notes and X-rays.  X-rays showed: IMPRESSION: Minimally displaced medial malleolar avulsion fracture. Extensive ankle soft tissue swelling.  She is in a CAM walker.  Her pain is controlled.  She has swelling of the left knee, no new trauma there.  She has crepitus.  Exam:  pain left ankle, medial swelling, NV intact, painful ROM.  NV intact.  Left knee with ROM 0 to 105, medial pain, crepitus, NV intact, stable, prefers not to stand, in wheelchair.  Encounter Diagnoses  Name Primary?   Closed nondisplaced fracture of medial malleolus of left tibia, initial encounter Yes   Chronic pain of left knee    Personality disorder ([place])    PROCEDURE NOTE:  The patient requests injections of the left knee , verbal consent was obtained.  The left knee was prepped appropriately after time out was performed.   Sterile technique was observed and injection of 1 cc of DepoMedrol 40 mg with several cc's of plain xylocaine. Anesthesia was provided by ethyl chloride and a 20-gauge needle was used to inject the knee area. The injection was tolerated well.  A band aid dressing was applied.  The patient was advised to apply ice later today and tomorrow to the injection sight as needed.  She is to continue the CAM walker.  I have completed forms for the rest home.  Tylenol or Advil for pain.  Return in two weeks.    X-rays then.  Call if any problem.  Precautions discussed.  Electronically Signed Sanjuana Kava, MD 1/12/20239:21 AM

## 2021-12-20 ENCOUNTER — Ambulatory Visit (INDEPENDENT_AMBULATORY_CARE_PROVIDER_SITE_OTHER): Payer: Medicaid Other | Admitting: Orthopaedic Surgery

## 2021-12-20 ENCOUNTER — Ambulatory Visit: Payer: Medicaid Other

## 2021-12-20 ENCOUNTER — Encounter: Payer: Self-pay | Admitting: Orthopaedic Surgery

## 2021-12-20 ENCOUNTER — Other Ambulatory Visit: Payer: Self-pay

## 2021-12-20 DIAGNOSIS — S8255XD Nondisplaced fracture of medial malleolus of left tibia, subsequent encounter for closed fracture with routine healing: Secondary | ICD-10-CM | POA: Diagnosis not present

## 2021-12-20 NOTE — Progress Notes (Signed)
My ankle is sore.  She has been using the CAM walker on the left ankle.  She has no new trauma.  NV intact.  ROM limited.  Pain medial left ankle.  X-rays were done of the left ankle, reported separately.  Encounter Diagnosis  Name Primary?   Closed nondisplaced fracture of medial malleolus of left tibia with routine healing, subsequent encounter Yes   Return in three weeks.   X-rays on return.  Continue CAM walker.  Forms completed for rest home.  Call if any problem.  Precautions discussed.  Electronically Signed Sanjuana Kava, MD 1/26/20238:46 AM

## 2022-01-02 ENCOUNTER — Ambulatory Visit: Payer: Medicaid Other | Admitting: Podiatry

## 2022-01-10 ENCOUNTER — Ambulatory Visit (INDEPENDENT_AMBULATORY_CARE_PROVIDER_SITE_OTHER): Payer: Medicaid Other | Admitting: Orthopaedic Surgery

## 2022-01-10 ENCOUNTER — Other Ambulatory Visit: Payer: Self-pay

## 2022-01-10 ENCOUNTER — Encounter: Payer: Self-pay | Admitting: Orthopaedic Surgery

## 2022-01-10 ENCOUNTER — Ambulatory Visit: Payer: Medicaid Other

## 2022-01-10 DIAGNOSIS — S8255XD Nondisplaced fracture of medial malleolus of left tibia, subsequent encounter for closed fracture with routine healing: Secondary | ICD-10-CM

## 2022-01-10 NOTE — Progress Notes (Signed)
I feel fine.  She has little pain of the left ankle.  X-rays were done of the left ankle, reported separately.    NV intact, no swelling, no pain.  Encounter Diagnosis  Name Primary?   Closed nondisplaced fracture of medial malleolus of left tibia with routine healing, subsequent encounter Yes   Continue the CAM walker.  Return in three weeks. Forms completed for rest home.  X-rays then out of boot.  Call if any problem.  Precautions discussed.  Electronically Signed Sanjuana Kava, MD 2/16/20239:33 AM

## 2022-01-31 ENCOUNTER — Ambulatory Visit: Payer: Medicaid Other

## 2022-01-31 ENCOUNTER — Ambulatory Visit (INDEPENDENT_AMBULATORY_CARE_PROVIDER_SITE_OTHER): Payer: Medicaid Other | Admitting: Orthopaedic Surgery

## 2022-01-31 ENCOUNTER — Other Ambulatory Visit: Payer: Self-pay

## 2022-01-31 ENCOUNTER — Encounter: Payer: Self-pay | Admitting: Orthopaedic Surgery

## 2022-01-31 DIAGNOSIS — S8255XD Nondisplaced fracture of medial malleolus of left tibia, subsequent encounter for closed fracture with routine healing: Secondary | ICD-10-CM

## 2022-01-31 DIAGNOSIS — M79601 Pain in right arm: Secondary | ICD-10-CM

## 2022-01-31 DIAGNOSIS — F609 Personality disorder, unspecified: Secondary | ICD-10-CM

## 2022-01-31 DIAGNOSIS — W19XXXA Unspecified fall, initial encounter: Secondary | ICD-10-CM

## 2022-01-31 NOTE — Progress Notes (Signed)
She is very emotional today. ? ?She had recent fall and hurt her right arm and elbow.  She complains of pain.   ? ?She has been wearing the CAM walker on the left ankle. ? ?Left ankle has no pain, no swelling, no redness.  NV intact. ROM of ankle is full. ? ?X-rays were done of the left ankle, reported separately. ? ?Right arm upper and elbow is tender with decreased motion.  NV intact.  There is no swelling, no redness.   ? ?X-rays were done of the right elbow and right humerus, reported separately. ? ?Encounter Diagnoses  ?Name Primary?  ? Closed nondisplaced fracture of medial malleolus of left tibia with routine healing, subsequent encounter Yes  ? Pain of right upper extremity   ? Personality disorder ([place])   ? ?No new fractures are noted. ? ?I will give sling for right arm. ? ?She can stop the CAM walker. ? ?Forms completed for rest home. ? ?Return in two weeks. ? ?Call if any problem. ? ?Precautions discussed. ? ?Electronically Signed ?Sanjuana Kava, MD ?3/9/202310:13 AM ? ?

## 2022-02-14 ENCOUNTER — Ambulatory Visit: Payer: Medicaid Other | Admitting: Orthopaedic Surgery

## 2022-02-21 ENCOUNTER — Ambulatory Visit: Payer: Medicaid Other | Admitting: Orthopaedic Surgery

## 2022-02-22 ENCOUNTER — Ambulatory Visit (INDEPENDENT_AMBULATORY_CARE_PROVIDER_SITE_OTHER): Payer: Medicaid Other | Admitting: Podiatry

## 2022-02-22 DIAGNOSIS — Z91199 Patient's noncompliance with other medical treatment and regimen due to unspecified reason: Secondary | ICD-10-CM

## 2022-02-22 NOTE — Progress Notes (Signed)
   Complete physical exam  Patient: Karina Clarke   DOB: 09/14/1997   61 y.o. Female  MRN: [id number]  Subjective:    No chief complaint on file.   Karina Clarke is a 61 y.o. female who presents today for a complete physical exam. She reports consuming a {diet types:17450} diet. {types:19826} She generally feels {DESC; WELL/FAIRLY WELL/POORLY:18703}. She reports sleeping {DESC; WELL/FAIRLY WELL/POORLY:18703}. She {does/does not:200015} have additional problems to discuss today.    Most recent fall risk assessment:    05/22/2022   10:42 AM  Fall Risk   Falls in the past year? 0  Number falls in past yr: 0  Injury with Fall? 0  Risk for fall due to : No Fall Risks  Follow up Falls evaluation completed     Most recent depression screenings:    05/22/2022   10:42 AM 04/12/2021   10:46 AM  PHQ 2/9 Scores  PHQ - 2 Score 0 0  PHQ- 9 Score 5     {VISON DENTAL STD PSA (Optional):27386}  {History (Optional):23778}  Patient Care Team: Jessup, Joy, NP as PCP - General (Nurse Practitioner)   Outpatient Medications Prior to Visit  Medication Sig   fluticasone (FLONASE) 50 MCG/ACT nasal spray Place 2 sprays into both nostrils in the morning and at bedtime. After 7 days, reduce to once daily.   norgestimate-ethinyl estradiol (SPRINTEC 28) 0.25-35 MG-MCG tablet Take 1 tablet by mouth daily.   Nystatin POWD Apply liberally to affected area 2 times per day   spironolactone (ALDACTONE) 100 MG tablet Take 1 tablet (100 mg total) by mouth daily.   No facility-administered medications prior to visit.    ROS        Objective:     There were no vitals taken for this visit. {Vitals History (Optional):23777}  Physical Exam   No results found for any visits on 06/27/22. {Show previous labs (optional):23779}    Assessment & Plan:    Routine Health Maintenance and Physical Exam  Immunization History  Administered Date(s) Administered   DTaP 11/28/1999, 01/24/2000,  04/03/2000, 12/18/2000, 07/03/2004   Hepatitis A 04/29/2008, 05/05/2009   Hepatitis B 09/15/1999, 10/23/1999, 04/03/2000   HiB (PRP-OMP) 11/28/1999, 01/24/2000, 04/03/2000, 12/18/2000   IPV 11/28/1999, 01/24/2000, 09/22/2000, 07/03/2004   Influenza,inj,Quad PF,6+ Mos 08/05/2014   Influenza-Unspecified 11/04/2012   MMR 09/22/2001, 07/03/2004   Meningococcal Polysaccharide 05/04/2012   Pneumococcal Conjugate-13 12/18/2000   Pneumococcal-Unspecified 04/03/2000, 06/17/2000   Tdap 05/04/2012   Varicella 09/22/2000, 04/29/2008    Health Maintenance  Topic Date Due   HIV Screening  Never done   Hepatitis C Screening  Never done   INFLUENZA VACCINE  06/25/2022   PAP-Cervical Cytology Screening  06/27/2022 (Originally 09/13/2020)   PAP SMEAR-Modifier  06/27/2022 (Originally 09/13/2020)   TETANUS/TDAP  06/27/2022 (Originally 05/04/2022)   HPV VACCINES  Discontinued   COVID-19 Vaccine  Discontinued    Discussed health benefits of physical activity, and encouraged her to engage in regular exercise appropriate for her age and condition.  Problem List Items Addressed This Visit   None Visit Diagnoses     Annual physical exam    -  Primary   Cervical cancer screening       Need for Tdap vaccination          No follow-ups on file.     Joy Jessup, NP   

## 2022-04-02 ENCOUNTER — Encounter: Payer: Self-pay | Admitting: Gastroenterology

## 2022-04-02 ENCOUNTER — Ambulatory Visit (INDEPENDENT_AMBULATORY_CARE_PROVIDER_SITE_OTHER): Payer: Medicaid Other | Admitting: Gastroenterology

## 2022-04-02 VITALS — BP 116/70 | HR 92 | Temp 97.3°F | Ht 71.0 in

## 2022-04-02 DIAGNOSIS — R7989 Other specified abnormal findings of blood chemistry: Secondary | ICD-10-CM

## 2022-04-02 NOTE — Patient Instructions (Signed)
Please have blood work completed. ? ?We will see you back as needed! ? ?Your next colonoscopy will be in 2026! ? ?I enjoyed seeing you again today! As you know, I value our relationship and want to provide genuine, compassionate, and quality care. I welcome your feedback. If you receive a survey regarding your visit,  I greatly appreciate you taking time to fill this out. See you next time! ? ?Annitta Needs, PhD, ANP-BC ?[facility]  ? ?

## 2022-04-02 NOTE — Progress Notes (Signed)
? ? ? ? ?Gastroenterology Office Note   ? ? ?Primary Care Physician:  Carrolyn Meiers, MD  ?Primary Gastroenterologist: Dr. Abbey Chatters ? ? ?Chief Complaint  ? ?Chief Complaint  ?Patient presents with  ? Follow-up  ?  Hospital follow up  ? ? ? ?History of Present Illness  ? ?Karina Clarke is a 61 year old female presenting today in hospital follow-up from last year (June 2022), where she was admitted with suspected overdose. 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 EGD completed while inpatient with normal esophagus s/p dilation, erosive gastropathy, normal duodenum.  ? ?I do not have recent labs. She is present with an aide today from [facility]. She is a limited historian. No abdominal pain, N/V, changes in bowel habits, constipation, diarrhea, overt GI bleeding, GERD, dysphagia, unexplained weight loss, lack of appetite, unexplained weight gain.  ? ? ?However, she states she feels dizzy this morning. Feels it is related to taking a large number of pills this morning. Requesting that it be spread out.  ? ? ? ?Past Medical History:  ?Diagnosis Date  ? Atypical lobular hyperplasia of left breast 06/2015  ? Bloating   ? per sister  ? Chronic right hip pain   ? sister states circulation problem in hip  ? Family history of adverse reaction to anesthesia   ? sister has hx. of post-op N/V  ? High cholesterol   ? HIV (human immunodeficiency virus infection) ([place])   ? Hypertension   ? Limp   ? Mild intellectual disability   ? No natural teeth   ? does not wear her dentures  ? Paranoid schizophrenia ([place])   ? Personality disorder ([place])   ? Schizophrenia ([place])   ? ? ?Past Surgical History:  ?Procedure Laterality Date  ? BREAST LUMPECTOMY WITH NEEDLE LOCALIZATION Left 07/28/2015  ? Procedure: BREAST LUMPECTOMY WITH NEEDLE LOCALIZATION;  Surgeon: Autumn Messing III, MD;  Location: [facility];  Service: General;  Laterality: Left;  ? COLONOSCOPY WITH PROPOFOL N/A 06/13/2015  ? SLF: 1. the left colon is  redundant 2. the examination was otherwise normal 3. small internal hemorrhoids 4. moderate sized external hemorrhoids  ? ESOPHAGOGASTRODUODENOSCOPY (EGD) WITH PROPOFOL N/A 05/19/2021  ? Procedure: ESOPHAGOGASTRODUODENOSCOPY (EGD) WITH PROPOFOL;  Surgeon: Daneil Dolin, MD;  [facility];  Service: Endoscopy;  Laterality: N/A;  with dilation  ? HEMORRHOID SURGERY    ? UMBILICAL HERNIA REPAIR    ? ? ?Current Outpatient Medications  ?Medication Sig Dispense Refill  ? abacavir-lamiVUDine (EPZICOM) 600-300 MG per tablet Take 1 tablet by mouth daily.    ? albuterol (PROVENTIL HFA;VENTOLIN HFA) 108 (90 Base) MCG/ACT inhaler Inhale 1-2 puffs into the lungs 2 (two) times daily as needed for wheezing or shortness of breath.    ? apixaban (ELIQUIS) 5 MG TABS tablet Take 2 tablets ('10mg'$ ) twice daily for 7 days, then 1 tablet ('5mg'$ ) twice daily 60 tablet 3  ? atazanavir (REYATAZ) 200 MG capsule Take 400 mg by mouth daily.     ? atorvastatin (LIPITOR) 10 MG tablet Take 10 mg by mouth daily.    ? divalproex (DEPAKOTE) 500 MG DR tablet Take 1 tablet (500 mg total) by mouth 2 (two) times daily. Take 1 tablet (500 mg) by mouth at 4pm and 8pm daily 60 tablet 0  ? haloperidol (HALDOL) 10 MG tablet Take 1 tablet (10 mg total) by mouth 3 (three) times daily. 90 tablet 0  ? haloperidol decanoate (HALDOL DECANOATE) 100 MG/ML injection  Inject 200 mg into the muscle every 28 (twenty-eight) days.    ? HYDROcodone-acetaminophen (NORCO/VICODIN) 5-325 MG tablet Take one tab po q 4 hrs prn pain 10 tablet 0  ? insulin detemir (LEVEMIR) 100 UNIT/ML injection Inject 0.1 mLs (10 Units total) into the skin at bedtime. 10 mL 11  ? Magnesium Oxide 400 MG CAPS Take 1 capsule (400 mg total) by mouth every morning. 60 each 0  ? metoprolol tartrate (LOPRESSOR) 25 MG tablet Take 0.5 tablets (12.5 mg total) by mouth 2 (two) times daily with a meal. 30 tablet 3  ? polyethylene glycol (MIRALAX / GLYCOLAX) packet Take 17 g by mouth daily as needed  for mild constipation.     ? raltegravir (ISENTRESS) 400 MG tablet Take 400 mg by mouth 2 (two) times daily.    ? trihexyphenidyl (ARTANE) 2 MG tablet Take 2 mg by mouth 2 (two) times daily with a meal.    ? valACYclovir (VALTREX) 1000 MG tablet Take 1,000 mg by mouth daily.     ? ?No current facility-administered medications for this visit.  ? ? ?Allergies as of 04/02/2022 - Review Complete 04/02/2022  ?Allergen Reaction Noted  ? Asa [aspirin] Nausea And Vomiting 06/08/2015  ? ? ?Family History  ?Problem Relation Age of Onset  ? Anesthesia problems Sister   ?     post-op N/V  ? Diabetes Father   ? ? ?Social History  ? ?Socioeconomic History  ? Marital status: Single  ?  Spouse name: Not on file  ? Number of children: Not on file  ? Years of education: Not on file  ? Highest education level: Not on file  ?Occupational History  ? Occupation: disabled  ?Tobacco Use  ? Smoking status: Former  ?  Packs/day: 1.00  ?  Years: 18.00  ?  Pack years: 18.00  ?  Types: Cigarettes  ? Smokeless tobacco: Never  ?Vaping Use  ? Vaping Use: Never used  ?Substance and Sexual Activity  ? Alcohol use: No  ?  Alcohol/week: 0.0 standard drinks  ? Drug use: No  ? Sexual activity: Never  ?  Birth control/protection: Post-menopausal  ?Other Topics Concern  ? Not on file  ?Social History Narrative  ? Not on file  ? ?Social Determinants of Health  ? ?Financial Resource Strain: Not on file  ?Food Insecurity: Not on file  ?Transportation Needs: Not on file  ?Physical Activity: Not on file  ?Stress: Not on file  ?Social Connections: Not on file  ?Intimate Partner Violence: Not on file  ? ? ? ?Review of Systems  ? ?Gen: Denies any fever, chills, fatigue, weight loss, lack of appetite.  ?CV: Denies chest pain, heart palpitations, peripheral edema, syncope.  ?Resp: Denies shortness of breath at rest or with exertion. Denies wheezing or cough.  ?GI: see HPI ?GU : Denies urinary burning, urinary frequency, urinary hesitancy ?MS: Denies joint pain,  muscle weakness, cramps, or limitation of movement.  ?Derm: Denies rash, itching, dry skin ?Psych: Denies depression, anxiety, memory loss, and confusion ?Heme: Denies bruising, bleeding, and enlarged lymph nodes. ? ? ?Physical Exam  ? ?BP 116/70   Pulse 92   Temp (!) 97.3 ?F (36.3 ?C)   Ht '5\' 11"'$  (1.803 m)   BMI 30.32 kg/m?  ?General:   Alert and oriented. Pleasant and cooperative. Well-nourished and well-developed.  ?Head:  Normocephalic and atraumatic. ?Eyes:  Without icterus ?Abdomen:  +BS, soft, non-tender and non-distended. In wheelchair. Cast on left leg  ?Rectal:  Deferred  ?Neurologic:  Alert and  oriented x4 ?Skin:  Intact without significant lesions or rashes. ?Psych:  Alert and cooperative. Normal mood and affect. ? ? ?Assessment  ? ?Lani Havlik is a 61 y.o. female presenting today in hospital follow-up from June 2022 likely due to ischemic hepatitis. ? ?Unfortunately, we do not have any recent labs, with last labs in June 2022 in our system. Will update HFP today. Suspect these will have normalized.  ? ?Denies any concerning upper or lower GI signs/symptoms. She is requesting that I ask the facility to space out her pills instead of taking all at once, if possible.  ? ?Next colonoscopy in 2026. She is on the recall list.  ? ? ? ?PLAN  ? ? ?Check HFP ?Return prn ?Next colonoscopy 2026 ? ? ?Annitta Needs, PhD, ANP-BC ?[facility]  ? ? ?

## 2022-04-20 LAB — HEPATIC FUNCTION PANEL
AG Ratio: 1.4 (calc) (ref 1.0–2.5)
ALT: 12 U/L (ref 6–29)
AST: 15 U/L (ref 10–35)
Albumin: 4.2 g/dL (ref 3.6–5.1)
Alkaline phosphatase (APISO): 99 U/L (ref 37–153)
Bilirubin, Direct: 0.1 mg/dL (ref 0.0–0.2)
Globulin: 3 g/dL (calc) (ref 1.9–3.7)
Indirect Bilirubin: 0.5 mg/dL (calc) (ref 0.2–1.2)
Total Bilirubin: 0.6 mg/dL (ref 0.2–1.2)
Total Protein: 7.2 g/dL (ref 6.1–8.1)

## 2022-05-02 ENCOUNTER — Ambulatory Visit (INDEPENDENT_AMBULATORY_CARE_PROVIDER_SITE_OTHER): Payer: Medicaid Other | Admitting: Orthopaedic Surgery

## 2022-05-02 ENCOUNTER — Encounter: Payer: Self-pay | Admitting: Orthopaedic Surgery

## 2022-05-02 DIAGNOSIS — M25562 Pain in left knee: Secondary | ICD-10-CM | POA: Diagnosis not present

## 2022-05-02 DIAGNOSIS — F609 Personality disorder, unspecified: Secondary | ICD-10-CM

## 2022-05-02 DIAGNOSIS — G8929 Other chronic pain: Secondary | ICD-10-CM

## 2022-05-02 DIAGNOSIS — M25561 Pain in right knee: Secondary | ICD-10-CM | POA: Diagnosis not present

## 2022-05-02 NOTE — Progress Notes (Signed)
PROCEDURE NOTE:  The patient requests injections of the left knee , verbal consent was obtained.  The left knee was prepped appropriately after time out was performed.   Sterile technique was observed and injection of 1 cc of DepoMedrol 40 mg with several cc's of plain xylocaine. Anesthesia was provided by ethyl chloride and a 20-gauge needle was used to inject the knee area. The injection was tolerated well.  A band aid dressing was applied.  The patient was advised to apply ice later today and tomorrow to the injection sight as needed.  PROCEDURE NOTE:  The patient requests injections of the right knee , verbal consent was obtained.  The right knee was prepped appropriately after time out was performed.   Sterile technique was observed and injection of 1 cc of DepoMedrol '40mg'$  with several cc's of plain xylocaine. Anesthesia was provided by ethyl chloride and a 20-gauge needle was used to inject the knee area. The injection was tolerated well.  A band aid dressing was applied.  The patient was advised to apply ice later today and tomorrow to the injection sight as needed.  Encounter Diagnoses  Name Primary?   Bilateral chronic knee pain Yes   Personality disorder ([place])    Return in six weeks.  Forms for rest home completed.  Call if any problem.  Precautions discussed.  Electronically Signed Sanjuana Kava, MD 6/8/20239:28 AM

## 2022-06-11 ENCOUNTER — Encounter: Payer: Self-pay | Admitting: Orthopaedic Surgery

## 2022-06-11 ENCOUNTER — Ambulatory Visit (INDEPENDENT_AMBULATORY_CARE_PROVIDER_SITE_OTHER): Payer: Medicaid Other | Admitting: Orthopaedic Surgery

## 2022-06-11 DIAGNOSIS — G8929 Other chronic pain: Secondary | ICD-10-CM

## 2022-06-11 DIAGNOSIS — M25561 Pain in right knee: Secondary | ICD-10-CM | POA: Diagnosis not present

## 2022-06-11 DIAGNOSIS — F609 Personality disorder, unspecified: Secondary | ICD-10-CM

## 2022-06-11 DIAGNOSIS — M25562 Pain in left knee: Secondary | ICD-10-CM

## 2022-06-11 NOTE — Progress Notes (Signed)
PROCEDURE NOTE:  The patient requests injections of the right knee , verbal consent was obtained.  The right knee was prepped appropriately after time out was performed.   Sterile technique was observed and injection of 1 cc of DepoMedrol '40mg'$  with several cc's of plain xylocaine. Anesthesia was provided by ethyl chloride and a 20-gauge needle was used to inject the knee area. The injection was tolerated well.  A band aid dressing was applied.  The patient was advised to apply ice later today and tomorrow to the injection sight as needed.  PROCEDURE NOTE:  The patient requests injections of the left knee , verbal consent was obtained.  The left knee was prepped appropriately after time out was performed.   Sterile technique was observed and injection of 1 cc of DepoMedrol 40 mg with several cc's of plain xylocaine. Anesthesia was provided by ethyl chloride and a 20-gauge needle was used to inject the knee area. The injection was tolerated well.  A band aid dressing was applied.  The patient was advised to apply ice later today and tomorrow to the injection sight as needed.  Encounter Diagnoses  Name Primary?   Bilateral chronic knee pain Yes   Personality disorder ([place])    Return in six weeks.  Forms completed for rest home.  Call if any problem.  Precautions discussed.  Electronically Signed Sanjuana Kava, MD 7/18/20238:54 AM

## 2022-07-23 ENCOUNTER — Ambulatory Visit: Payer: Medicaid Other | Admitting: Orthopaedic Surgery

## 2022-10-29 ENCOUNTER — Ambulatory Visit: Payer: Medicaid Other | Admitting: Orthopaedic Surgery

## 2022-11-27 ENCOUNTER — Ambulatory Visit: Payer: Medicaid Other | Admitting: Podiatry

## 2023-01-28 ENCOUNTER — Other Ambulatory Visit (HOSPITAL_COMMUNITY)
Admission: RE | Admit: 2023-01-28 | Discharge: 2023-01-28 | Disposition: A | Discharge status: Home / Self Care | Payer: Medicaid Other | Source: Ambulatory Visit | Attending: Internal Medicine | Admitting: Internal Medicine

## 2023-01-28 ENCOUNTER — Ambulatory Visit (HOSPITAL_COMMUNITY)
Admission: RE | Admit: 2023-01-28 | Discharge: 2023-01-28 | Disposition: A | Discharge status: Home / Self Care | Payer: Medicaid Other | Source: Ambulatory Visit | Attending: Internal Medicine | Admitting: Internal Medicine

## 2023-01-28 ENCOUNTER — Other Ambulatory Visit: Payer: Self-pay

## 2023-01-28 DIAGNOSIS — Z79899 Other long term (current) drug therapy: Secondary | ICD-10-CM | POA: Diagnosis not present

## 2023-01-28 DIAGNOSIS — R000 Tachycardia, unspecified: Secondary | ICD-10-CM | POA: Insufficient documentation

## 2023-01-28 LAB — BASIC METABOLIC PANEL
Anion gap: 13 (ref 5–15)
BUN: 36 mg/dL — ABNORMAL HIGH (ref 6–20)
CO2: 26 mmol/L (ref 22–32)
Calcium: 9.5 mg/dL (ref 8.9–10.3)
Chloride: 99 mmol/L (ref 98–111)
Creatinine, Ser: 1.35 mg/dL — ABNORMAL HIGH (ref 0.44–1.00)
GFR, Estimated: 45 mL/min — ABNORMAL LOW (ref >60)
Glucose, Bld: 128 mg/dL — ABNORMAL HIGH (ref 70–99)
Potassium: 4.5 mmol/L (ref 3.5–5.1)
Sodium: 138 mmol/L (ref 135–145)

## 2023-03-25 ENCOUNTER — Ambulatory Visit: Payer: Medicaid Other | Attending: Internal Medicine | Admitting: Internal Medicine

## 2023-03-25 ENCOUNTER — Encounter: Payer: Self-pay | Admitting: Internal Medicine

## 2023-03-25 VITALS — BP 114/78 | HR 104 | Wt 198.0 lb

## 2023-03-25 DIAGNOSIS — R000 Tachycardia, unspecified: Secondary | ICD-10-CM | POA: Diagnosis not present

## 2023-03-25 DIAGNOSIS — E7849 Other hyperlipidemia: Secondary | ICD-10-CM | POA: Diagnosis not present

## 2023-03-25 DIAGNOSIS — E785 Hyperlipidemia, unspecified: Secondary | ICD-10-CM | POA: Insufficient documentation

## 2023-03-25 MED ORDER — METOPROLOL TARTRATE 25 MG PO TABS: 25.0000 mg | ORAL_TABLET | Freq: Two times a day (BID) | ORAL | 3 refills | Status: DC

## 2023-03-25 NOTE — Progress Notes (Signed)
Cardiology Office Note  Date: 03/25/2023   ID: Karina Clarke, DOB 03/24/1961, MRN [id number]  PCP:  Benetta Spar, MD  Cardiologist:  Marjo Bicker, MD Electrophysiologist:  None   Reason for Office Visit: Evaluation of tachycardia the request of Dr. Felecia Shelling   History of Present Illness: Karina Clarke is a 62 y.o. female known to have HIV, schizophrenia, HLD, HTN was referred to cardiology clinic for evaluation of tachycardia.  Accompanied by caretaker.  History is provided by caretaker and the patient. Caretaker reported that patient sleeps almost throughout the day after taking sleep medications.  Patient reported not drinking enough water in the facility. She wakes up only to have breakfast or meals which was when she noticed palpitations lasting for a few seconds to a minute. These palpitations are not new and has been ongoing for many years.  She was previously taking metoprolol tartrate 12.5 mg twice daily.  I reviewed all her labs, labs within normal limits and TSH was not noted.  Denies other symptoms of angina, DOE, syncope, leg swelling.  Past Medical History:  Diagnosis Date   Atypical lobular hyperplasia of left breast 06/2015   Bloating    per sister   Chronic right hip pain    sister states circulation problem in hip   Family history of adverse reaction to anesthesia    sister has hx. of post-op N/V   High cholesterol    HIV (human immunodeficiency virus infection) ([facility])    Hypertension    Limp    Mild intellectual disability    No natural teeth    does not wear her dentures   Paranoid schizophrenia ([facility])    Personality disorder ([facility])    Schizophrenia ([facility])     Past Surgical History:  Procedure Laterality Date   BREAST LUMPECTOMY WITH NEEDLE LOCALIZATION Left 07/28/2015   Procedure: BREAST LUMPECTOMY WITH NEEDLE LOCALIZATION;  Surgeon: Chevis Pretty III, MD;  Location: [facility];  Service: General;  Laterality: Left;   COLONOSCOPY  WITH PROPOFOL N/A 06/13/2015   SLF: 1. the left colon is redundant 2. the examination was otherwise normal 3. small internal hemorrhoids 4. moderate sized external hemorrhoids   ESOPHAGOGASTRODUODENOSCOPY (EGD) WITH PROPOFOL N/A 05/19/2021   Procedure: ESOPHAGOGASTRODUODENOSCOPY (EGD) WITH PROPOFOL;  Surgeon: Corbin Ade, MD;  [facility];  Service: Endoscopy;  Laterality: N/A;  with dilation   HEMORRHOID SURGERY     UMBILICAL HERNIA REPAIR      Current Outpatient Medications  Medication Sig Dispense Refill   abacavir-lamiVUDine (EPZICOM) 600-300 MG per tablet Take 1 tablet by mouth daily.     ALPRAZolam (XANAX) 1 MG tablet Take 1 mg by mouth at bedtime as needed for anxiety.     atazanavir (REYATAZ) 200 MG capsule Take 400 mg by mouth daily.      atorvastatin (LIPITOR) 10 MG tablet Take 10 mg by mouth daily.     haloperidol decanoate (HALDOL DECANOATE) 100 MG/ML injection Inject 200 mg into the muscle every 28 (twenty-eight) days.     hydrOXYzine (ATARAX) 25 MG tablet Take 25 mg by mouth at bedtime as needed.     Magnesium Oxide 400 MG CAPS Take 1 capsule (400 mg total) by mouth every morning. 60 each 0   metoprolol tartrate (LOPRESSOR) 25 MG tablet Take 1 tablet (25 mg total) by mouth 2 (two) times daily. 180 tablet 3   polyethylene glycol (MIRALAX / GLYCOLAX) packet Take 17 g by mouth daily as needed for  mild constipation.      QUEtiapine (SEROQUEL) 400 MG tablet Take 400 mg by mouth 2 (two) times daily.     raltegravir (ISENTRESS) 400 MG tablet Take 400 mg by mouth 2 (two) times daily.     trihexyphenidyl (ARTANE) 2 MG tablet Take 2 mg by mouth 2 (two) times daily with a meal.     valACYclovir (VALTREX) 1000 MG tablet Take 1,000 mg by mouth daily.      divalproex (DEPAKOTE) 500 MG DR tablet Take 1 tablet (500 mg total) by mouth 2 (two) times daily. Take 1 tablet (500 mg) by mouth at 4pm and 8pm daily 60 tablet 0   haloperidol (HALDOL) 10 MG tablet Take 1 tablet (10 mg total)  by mouth 3 (three) times daily. 90 tablet 0   No current facility-administered medications for this visit.   Allergies:  Asa [aspirin]   Social History: The patient  reports that she has quit smoking. Her smoking use included cigarettes. She has a 18.00 pack-year smoking history. She has never used smokeless tobacco. She reports that she does not drink alcohol and does not use drugs.   Family History: The patient's family history includes Anesthesia problems in her sister; Diabetes in her father.   ROS:  Please see the history of present illness. Otherwise, complete review of systems is positive for none.  All other systems are reviewed and negative.   Physical Exam: VS:  BP 114/78   Pulse (!) 104   Wt 198 lb (89.8 kg)   SpO2 95%   BMI 27.62 kg/m , BMI Body mass index is 27.62 kg/m.  Wt Readings from Last 3 Encounters:  03/25/23 198 lb (89.8 kg)  12/04/21 217 lb 6 oz (98.6 kg)  05/16/21 217 lb 6 oz (98.6 kg)    General: Patient appears comfortable at rest. HEENT: Conjunctiva and lids normal, oropharynx clear with moist mucosa. Neck: Supple, no elevated JVP or carotid bruits, no thyromegaly. Lungs: Clear to auscultation, nonlabored breathing at rest. Cardiac: Regular rate and rhythm, no S3 or significant systolic murmur, no pericardial rub. Abdomen: Soft, nontender, no hepatomegaly, bowel sounds present, no guarding or rebound. Extremities: No pitting edema, distal pulses 2+. Skin: Warm and dry. Musculoskeletal: No kyphosis. Neuropsychiatric: Alert and oriented x3, affect grossly appropriate.  Recent Labwork: 04/19/2022: ALT 12; AST 15 01/28/2023: BUN 36; Creatinine, Ser 1.35; Potassium 4.5; Sodium 138  No results found for: "CHOL", "TRIG", "HDL", "CHOLHDL", "VLDL", "LDLCALC", "LDLDIRECT"  Other Studies Reviewed Today:   Assessment and Plan: Patient is a 62 year old F known to have HIV, schizophrenia, HTN, HLD was referred to cardiology clinic for evaluation  tachycardia.  # Sinus tachycardia -I believe patient is dehydrated and does not drink adequate water in the facility. She sleeps throughout the day and wakes up only to have breakfast and meals which is when she feels palpitations. Drink at least 64 ounces of water daily. If after adequate hydration, if she continues to have palpitations, can start taking metoprolol tartarate 25 mg twice daily. Otherwise, obtain TSH. The remaining labs, CBC and BMP were within normal limits. # HLD: Continue atorvastatin 10 mg nightly, HLD management per PCP.   I have spent a total of 30 minutes with patient reviewing chart, EKGs, labs and examining patient as well as establishing an assessment and plan that was discussed with the patient.  > 50% of time was spent in direct patient care.    Medication Adjustments/Labs and Tests Ordered: Current medicines are reviewed at length  with the patient today.  Concerns regarding medicines are outlined above.   Tests Ordered: No orders of the defined types were placed in this encounter.   Medication Changes: Meds ordered this encounter  Medications   metoprolol tartrate (LOPRESSOR) 25 MG tablet    Sig: Take 1 tablet (25 mg total) by mouth 2 (two) times daily.    Dispense:  180 tablet    Refill:  3    Disposition:  Follow up  6 months  Signed, Donoven Pett Verne Spurr, MD, 03/25/2023 12:20 PM    [facility] at [facility] [address], [place], [place] [address]

## 2023-03-25 NOTE — Patient Instructions (Signed)
Medication Instructions:   Drink 64 oz of water daily for the next 2 weeks. If symptoms persist then start Lopressor 25 mg Two Times Daily   *If you need a refill on your cardiac medications before your next appointment, please call your pharmacy*   Lab Work: NONE   If you have labs (blood work) drawn today and your tests are completely normal, you will receive your results only by: MyChart Message (if you have MyChart) OR A paper copy in the mail If you have any lab test that is abnormal or we need to change your treatment, we will call you to review the results.   Testing/Procedures: NONE    Follow-Up: At [facility], you and your health needs are our priority.  As part of our continuing mission to provide you with exceptional heart care, we have created designated Provider Care Teams.  These Care Teams include your primary Cardiologist (physician) and Advanced Practice Providers (APPs -  Physician Assistants and Nurse Practitioners) who all work together to provide you with the care you need, when you need it.  We recommend signing up for the patient portal called "MyChart".  Sign up information is provided on this After Visit Summary.  MyChart is used to connect with patients for Virtual Visits (Telemedicine).  Patients are able to view lab/test results, encounter notes, upcoming appointments, etc.  Non-urgent messages can be sent to your provider as well.   To learn more about what you can do with MyChart, go to [email or web address].    Your next appointment:   6 month(s)  Provider:   You may see Vishnu P Mallipeddi, MD or one of the following Advanced Practice Providers on your designated Care Team:   Randall An, PA-C  Jacolyn Reedy, PA-C     Other Instructions Thank you for choosing [facility]!

## 2023-08-01 ENCOUNTER — Encounter: Payer: Self-pay | Admitting: Podiatry

## 2023-08-01 ENCOUNTER — Ambulatory Visit (INDEPENDENT_AMBULATORY_CARE_PROVIDER_SITE_OTHER): Payer: MEDICAID | Admitting: Podiatry

## 2023-08-01 DIAGNOSIS — M79674 Pain in right toe(s): Secondary | ICD-10-CM

## 2023-08-01 DIAGNOSIS — M79675 Pain in left toe(s): Secondary | ICD-10-CM

## 2023-08-01 DIAGNOSIS — E119 Type 2 diabetes mellitus without complications: Secondary | ICD-10-CM

## 2023-08-01 DIAGNOSIS — B351 Tinea unguium: Secondary | ICD-10-CM

## 2023-08-01 NOTE — Progress Notes (Signed)

## 2023-09-02 ENCOUNTER — Other Ambulatory Visit: Payer: Self-pay | Admitting: Internal Medicine

## 2023-09-11 ENCOUNTER — Encounter: Payer: Self-pay | Admitting: Internal Medicine

## 2023-09-11 ENCOUNTER — Ambulatory Visit: Payer: MEDICAID | Attending: Internal Medicine | Admitting: Internal Medicine

## 2023-09-11 NOTE — Progress Notes (Signed)
Erroneous encounter - please disregard.

## 2023-09-30 NOTE — Progress Notes (Signed)
  Cardiology Office Note:  .   Date:  10/14/2023  ID:  Karina Clarke, DOB 08-Dec-1960, MRN [id number] PCP: Benetta Spar, MD  [facility] Providers Cardiologist:  Marjo Bicker, MD    History of Present Illness: Karina Clarke   Karina Clarke is a 62 y.o. female with history of HIV, schizophrenia, HLD, HTN.  Patient saw Dr. Jenene Slicker 02/2023 with palpitations. Caretaker said patient sleeps most of the day and doesn't drink much water at facility. Was having palpitations after meals previously on metoprolol 12.5 mg bid.Labs were stable and she was asked to drink more water.  Patient comes in with caregiver from facility. She isn't having any more palpitations. Only drinks 3 glasses of water a day. She sleeps most of the day and night. Only gets up for meals and bathroom. BP running low but no dizziness or symptoms.  ROS:    Studies Reviewed: Karina Clarke         Prior CV Studies:   Echo 2022 IMPRESSIONS     1. Left ventricular ejection fraction, by estimation, is 60 to 65%. The  left ventricle has normal function. The left ventricle has no regional  wall motion abnormalities. There is mild left ventricular hypertrophy.  Left ventricular diastolic parameters  are consistent with Grade I diastolic dysfunction (impaired relaxation).   2. Right ventricular systolic function is mildly reduced. The right  ventricular size is moderately enlarged. There is normal pulmonary artery  systolic pressure.   3. The mitral valve is normal in structure. No evidence of mitral valve  regurgitation. No evidence of mitral stenosis.   4. The aortic valve is tricuspid. Aortic valve regurgitation is not  visualized. No aortic stenosis is present.   5. The inferior vena cava is normal in size with greater than 50%  respiratory variability, suggesting right atrial pressure of 3 mmHg.  Risk Assessment/Calculations:             Physical Exam:   VS:  BP 110/62   Pulse 86   Ht 5\' 11"  (1.803 m)   Wt  197 lb (89.4 kg)   SpO2 100%   BMI 27.48 kg/m    Wt Readings from Last 3 Encounters:  10/14/23 197 lb (89.4 kg)  03/25/23 198 lb (89.8 kg)  12/04/21 217 lb 6 oz (98.6 kg)    GEN: Well nourished, well developed in no acute distress NECK: No JVD; No carotid bruits CARDIAC:  RRR, no murmurs, rubs, gallops RESPIRATORY:  Clear to auscultation without rales, wheezing or rhonchi  ABDOMEN: Soft, non-tender, non-distended EXTREMITIES:  No edema; No deformity   ASSESSMENT AND PLAN: .   Palpitations/Sinus tachycardia felt due to inadequate water intake-palpitations improved on metoprolol 25 mg bid but still not drinking enough water. Encouraged water intake 64 ounces daily. Labs reviewed in care everywhere from 07/2023 and were stable. No changes. F/u in 1 yr.  HTN-BP running low but asymptomatic. Continue metoprolol 25 mg bid and have facility check BP's.  HLD on atorvastatin managed by PCP        Dispo: f/u in 1 yr.  Signed, Jacolyn Reedy, PA-C

## 2023-10-14 ENCOUNTER — Encounter: Payer: Self-pay | Admitting: Physician Assistant

## 2023-10-14 ENCOUNTER — Ambulatory Visit: Payer: MEDICAID | Attending: Internal Medicine | Admitting: Physician Assistant

## 2023-10-14 VITALS — BP 110/62 | HR 86 | Ht 71.0 in | Wt 197.0 lb

## 2023-10-14 DIAGNOSIS — I10 Essential (primary) hypertension: Secondary | ICD-10-CM

## 2023-10-14 DIAGNOSIS — E7849 Other hyperlipidemia: Secondary | ICD-10-CM

## 2023-10-14 DIAGNOSIS — R002 Palpitations: Secondary | ICD-10-CM

## 2023-10-14 DIAGNOSIS — R000 Tachycardia, unspecified: Secondary | ICD-10-CM | POA: Diagnosis not present

## 2023-10-14 NOTE — Patient Instructions (Signed)
Medication Instructions:  Your physician recommends that you continue on your current medications as directed. Please refer to the Current Medication list given to you today.  Increase water intake to 64 oz daily   *If you need a refill on your cardiac medications before your next appointment, please call your pharmacy*   Lab Work: NONE   If you have labs (blood work) drawn today and your tests are completely normal, you will receive your results only by: MyChart Message (if you have MyChart) OR A paper copy in the mail If you have any lab test that is abnormal or we need to change your treatment, we will call you to review the results.   Testing/Procedures: NONE    Follow-Up: At [facility], you and your health needs are our priority.  As part of our continuing mission to provide you with exceptional heart care, we have created designated Provider Care Teams.  These Care Teams include your primary Cardiologist (physician) and Advanced Practice Providers (APPs -  Physician Assistants and Nurse Practitioners) who all work together to provide you with the care you need, when you need it.  We recommend signing up for the patient portal called "MyChart".  Sign up information is provided on this After Visit Summary.  MyChart is used to connect with patients for Virtual Visits (Telemedicine).  Patients are able to view lab/test results, encounter notes, upcoming appointments, etc.  Non-urgent messages can be sent to your provider as well.   To learn more about what you can do with MyChart, go to [email or web address].    Your next appointment:   1 year(s)  Provider:   You may see Vishnu P Mallipeddi, MD or one of the following Advanced Practice Providers on your designated Care Team:   Randall An, PA-C  Jacolyn Reedy, PA-C     Other Instructions Thank you for choosing [facility]!

## 2023-12-01 ENCOUNTER — Ambulatory Visit: Payer: MEDICAID | Admitting: Podiatry

## 2023-12-15 ENCOUNTER — Ambulatory Visit: Payer: MEDICAID | Admitting: Podiatry

## 2024-01-28 ENCOUNTER — Telehealth: Payer: Self-pay | Admitting: Internal Medicine

## 2024-01-28 NOTE — Telephone Encounter (Signed)
 Pt c/o medication issue:  1. Name of Medication:   apixaban (ELIQUIS) 5 MG TABS tablet   2. How are you currently taking this medication (dosage and times per day)?   3. Are you having a reaction (difficulty breathing--STAT)?   4. What is your medication issue?   Caller Verl Bangs) stated patient had an office visit today and the medication was still on patient's medication list.  Caller wants a copy of patient's last office visit notes and medication list faxed to her at fax# [phone].

## 2024-05-17 ENCOUNTER — Ambulatory Visit: Payer: MEDICAID | Admitting: Podiatry

## 2024-05-20 ENCOUNTER — Ambulatory Visit (HOSPITAL_COMMUNITY)
Admission: RE | Admit: 2024-05-20 | Discharge: 2024-05-20 | Disposition: A | Discharge status: Home / Self Care | Payer: MEDICAID | Source: Ambulatory Visit | Attending: Gerontology | Admitting: Gerontology

## 2024-05-20 ENCOUNTER — Encounter: Payer: Self-pay | Admitting: Podiatry

## 2024-05-20 ENCOUNTER — Ambulatory Visit: Payer: MEDICAID | Admitting: Podiatry

## 2024-05-20 ENCOUNTER — Other Ambulatory Visit (HOSPITAL_COMMUNITY): Payer: Self-pay | Admitting: Gerontology

## 2024-05-20 DIAGNOSIS — M25551 Pain in right hip: Secondary | ICD-10-CM | POA: Diagnosis present

## 2024-05-20 DIAGNOSIS — B351 Tinea unguium: Secondary | ICD-10-CM

## 2024-05-20 DIAGNOSIS — M79675 Pain in left toe(s): Secondary | ICD-10-CM | POA: Diagnosis not present

## 2024-05-20 DIAGNOSIS — M79674 Pain in right toe(s): Secondary | ICD-10-CM

## 2024-05-20 DIAGNOSIS — E119 Type 2 diabetes mellitus without complications: Secondary | ICD-10-CM

## 2024-05-20 NOTE — Progress Notes (Signed)

## 2024-09-16 ENCOUNTER — Ambulatory Visit: Payer: MEDICAID | Admitting: Podiatry

## 2024-12-20 ENCOUNTER — Other Ambulatory Visit (HOSPITAL_COMMUNITY): Payer: Self-pay | Admitting: Internal Medicine

## 2024-12-20 DIAGNOSIS — Z1231 Encounter for screening mammogram for malignant neoplasm of breast: Secondary | ICD-10-CM

## 2025-01-21 ENCOUNTER — Ambulatory Visit: Payer: MEDICAID | Admitting: Podiatry

## 2025-09-05 ENCOUNTER — Ambulatory Visit (HOSPITAL_COMMUNITY): Payer: MEDICAID
